# Patient Record
Sex: Female | Born: 1985 | Race: White | Hispanic: No | Marital: Married | Smoking: Former smoker
Health system: Southern US, Community
[De-identification: ages and names within clinical notes are randomized; demographics above are authoritative.]

## PROBLEM LIST (undated history)

## (undated) DIAGNOSIS — G43909 Migraine, unspecified, not intractable, without status migrainosus: Secondary | ICD-10-CM

## (undated) DIAGNOSIS — E119 Type 2 diabetes mellitus without complications: Secondary | ICD-10-CM

## (undated) DIAGNOSIS — K219 Gastro-esophageal reflux disease without esophagitis: Secondary | ICD-10-CM

## (undated) DIAGNOSIS — E039 Hypothyroidism, unspecified: Secondary | ICD-10-CM

## (undated) DIAGNOSIS — M549 Dorsalgia, unspecified: Secondary | ICD-10-CM

## (undated) DIAGNOSIS — N200 Calculus of kidney: Secondary | ICD-10-CM

## (undated) DIAGNOSIS — M5416 Radiculopathy, lumbar region: Secondary | ICD-10-CM

## (undated) DIAGNOSIS — F329 Major depressive disorder, single episode, unspecified: Secondary | ICD-10-CM

## (undated) DIAGNOSIS — F32A Depression, unspecified: Secondary | ICD-10-CM

## (undated) DIAGNOSIS — Z765 Malingerer [conscious simulation]: Secondary | ICD-10-CM

## (undated) DIAGNOSIS — I10 Essential (primary) hypertension: Secondary | ICD-10-CM

## (undated) DIAGNOSIS — G8929 Other chronic pain: Secondary | ICD-10-CM

## (undated) DIAGNOSIS — F419 Anxiety disorder, unspecified: Secondary | ICD-10-CM

## (undated) DIAGNOSIS — F4310 Post-traumatic stress disorder, unspecified: Secondary | ICD-10-CM

## (undated) HISTORY — DX: Type 2 diabetes mellitus without complications: E11.9

## (undated) HISTORY — PX: KNEE SURGERY: SHX244

## (undated) HISTORY — PX: ANTERIOR CRUCIATE LIGAMENT REPAIR: SHX115

## (undated) HISTORY — PX: OTHER SURGICAL HISTORY: SHX169

---
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
## 2009-10-18 LAB — CONVERTED CEMR LAB: Pap Smear: NORMAL

## 2010-01-14 ENCOUNTER — Ambulatory Visit (HOSPITAL_COMMUNITY): Admission: RE | Admit: 2010-01-14 | Discharge: 2010-01-14 | Payer: Self-pay | Admitting: Obstetrics and Gynecology

## 2010-01-27 ENCOUNTER — Ambulatory Visit: Payer: Self-pay | Admitting: Nurse Practitioner

## 2010-01-27 ENCOUNTER — Inpatient Hospital Stay (HOSPITAL_COMMUNITY): Admission: AD | Admit: 2010-01-27 | Discharge: 2010-01-27 | Payer: Self-pay | Admitting: Obstetrics and Gynecology

## 2010-01-27 ENCOUNTER — Encounter: Payer: Self-pay | Admitting: Endocrinology

## 2010-02-10 ENCOUNTER — Ambulatory Visit (HOSPITAL_COMMUNITY): Admission: RE | Admit: 2010-02-10 | Discharge: 2010-02-10 | Payer: Self-pay | Admitting: Obstetrics and Gynecology

## 2010-03-22 ENCOUNTER — Encounter: Payer: Self-pay | Admitting: Endocrinology

## 2010-04-01 ENCOUNTER — Encounter: Payer: Self-pay | Admitting: Endocrinology

## 2010-04-05 ENCOUNTER — Encounter: Payer: Self-pay | Admitting: Endocrinology

## 2010-04-15 ENCOUNTER — Encounter: Payer: Self-pay | Admitting: Endocrinology

## 2010-04-19 ENCOUNTER — Ambulatory Visit: Payer: Self-pay | Admitting: Interventional Radiology

## 2010-04-19 ENCOUNTER — Ambulatory Visit (HOSPITAL_BASED_OUTPATIENT_CLINIC_OR_DEPARTMENT_OTHER): Admission: RE | Admit: 2010-04-19 | Discharge: 2010-04-19 | Payer: Self-pay | Admitting: Obstetrics and Gynecology

## 2010-04-21 ENCOUNTER — Inpatient Hospital Stay (HOSPITAL_COMMUNITY): Admission: AD | Admit: 2010-04-21 | Discharge: 2010-04-21 | Payer: Self-pay | Admitting: Obstetrics and Gynecology

## 2010-04-22 ENCOUNTER — Inpatient Hospital Stay (HOSPITAL_COMMUNITY): Admission: AD | Admit: 2010-04-22 | Discharge: 2010-04-22 | Payer: Self-pay | Admitting: Obstetrics and Gynecology

## 2010-04-24 ENCOUNTER — Observation Stay (HOSPITAL_COMMUNITY): Admission: AD | Admit: 2010-04-24 | Discharge: 2010-04-25 | Payer: Self-pay | Admitting: Obstetrics and Gynecology

## 2010-04-26 ENCOUNTER — Ambulatory Visit: Payer: Self-pay | Admitting: Endocrinology

## 2010-04-26 DIAGNOSIS — O99810 Abnormal glucose complicating pregnancy: Secondary | ICD-10-CM

## 2010-04-30 ENCOUNTER — Inpatient Hospital Stay (HOSPITAL_COMMUNITY): Admission: AD | Admit: 2010-04-30 | Discharge: 2010-04-30 | Payer: Self-pay | Admitting: *Deleted

## 2010-05-05 ENCOUNTER — Encounter: Payer: Self-pay | Admitting: Endocrinology

## 2010-05-07 ENCOUNTER — Inpatient Hospital Stay (HOSPITAL_COMMUNITY)
Admission: AD | Admit: 2010-05-07 | Discharge: 2010-05-07 | Payer: Self-pay | Source: Home / Self Care | Admitting: Obstetrics and Gynecology

## 2010-05-19 ENCOUNTER — Inpatient Hospital Stay (HOSPITAL_COMMUNITY)
Admission: AD | Admit: 2010-05-19 | Discharge: 2010-05-19 | Payer: Self-pay | Source: Home / Self Care | Admitting: Obstetrics and Gynecology

## 2010-05-25 ENCOUNTER — Encounter: Payer: Self-pay | Admitting: Endocrinology

## 2010-05-27 ENCOUNTER — Inpatient Hospital Stay (HOSPITAL_COMMUNITY)
Admission: AD | Admit: 2010-05-27 | Discharge: 2010-05-30 | Payer: Self-pay | Source: Home / Self Care | Attending: Obstetrics and Gynecology | Admitting: Obstetrics and Gynecology

## 2010-05-27 ENCOUNTER — Encounter: Payer: Self-pay | Admitting: Obstetrics and Gynecology

## 2010-06-03 ENCOUNTER — Encounter: Payer: Self-pay | Admitting: Endocrinology

## 2010-06-22 ENCOUNTER — Encounter: Payer: Self-pay | Admitting: Endocrinology

## 2010-07-18 LAB — CONVERTED CEMR LAB
AST: 20 units/L
Alkaline Phosphatase: 110 units/L
BUN: 2 mg/dL
Basophils Relative: 0 %
Calcium: 8.9 mg/dL
GFR calc Af Amer: >60 mL/min
GFR calc non Af Amer: >60 mL/min
Glucose, Bld: 98 mg/dL
Glucose, Urine, Semiquant: NEGATIVE
HCT: 32.3 %
HCT: 32.5 %
Hemoglobin: 10.9 g/dL
Hemoglobin: 10.9 g/dL
Ketones, urine, test strip: NEGATIVE
Lymphocytes, automated: 2.4 %
MCV: 86.7 fL
Nitrite: NEGATIVE
Platelets: 278 10*3/uL
Platelets: 295 10*3/uL
Potassium: 3.8 meq/L
RBC: 3.76 M/uL
Sodium: 140 meq/L
Total Protein: 6.4 g/dL
Urobilinogen, UA: 0.2
WBC: 8.6 10*3/uL
pH: 7

## 2010-07-20 NOTE — Medication Information (Signed)
Summary: Diabetes Supplies/[facility]  Diabetes Supplies/[facility]   Imported By: Sherian Rein 05/27/2010 08:27:52  _____________________________________________________________________  External Attachment:    Type:   Image     Comment:   External Document

## 2010-07-20 NOTE — Medication Information (Signed)
Summary: Diabtes Supplies/[facility]  Diabtes Supplies/[facility]   Imported By: Sherian Rein 05/10/2010 07:45:04  _____________________________________________________________________  External Attachment:    Type:   Image     Comment:   External Document

## 2010-07-20 NOTE — Assessment & Plan Note (Signed)
Summary: NEW MEDICAID NOT CAR ACCESS PT-GESTATIONAL DIABETES-PER TRACE...   Vital Signs:  Patient profile:   25 year old female Height:      61.25 inches (155.57 cm) Weight:      268.25 pounds (121.93 kg) BMI:     50.45 O2 Sat:      97 % on Room air Temp:     98.41 degrees F (36.89 degrees C) oral Pulse rate:   118 / minute BP sitting:   124 / 70  (left arm) Cuff size:   large  Vitals Entered By: Brenton Grills CMA Duncan Dull) (April 26, 2010 2:51 PM)  O2 Flow:  Room air CC: New Endo/Gestational Diabetes/[facility]/aj Is Patient Diabetic? Yes   Referring Provider:  E. Erling Cruz, MD Primary Provider:  Brent Bulla MD  CC:  New Endo/Gestational Diabetes/[facility]/aj.  History of Present Illness: pt is G3 P1 (F0 P1 A1 L1).  pt is now at [redacted] weeks gestation.  pt had pre-eclampsia with previous pregnancy, but not gdm.  she brings a record of her cbg's which i have reviewed today.  it varies from [phone], with no trend throughout the day.  she had steriod injections x 2 for preterm labor.  most recent injection was 5 days ago. she has gained 8 lbs, for the entire pregnancy.  she was started on glyburide 1 week ago.   symptomatically, pt states 8 mos of intermittent severe headache, worst at the bioccipital areas, and assoc n/v.    Current Medications (verified): 1)  Glyburide 2.5 Mg Tabs (Glyburide) .... 2 By By Mouth Once Daily  Allergies (verified): 1)  ! Macrobid (Nitrofurantoin Monohyd Macro)  Past History:  Past Medical History: Gestational Diabetes  Past Surgical History: C-Section (2007) Tonsillectomy (1991)  Family History: Reviewed history and no changes required. Family History of Alcoholism/Addiction Family History of Arthritis Family History Lung cancer Family History of Stroke (Parent, Other Blood Relative)  Social History: Reviewed history and no changes required. engaged to be married Former Smoker Alcohol use-no Regular  exercise-no Smoking Status:  quit Does Patient Exercise:  no Seat Belt Use:  yes  Review of Systems       denies blurry vision, chest pain, diarrhea, dysuria, excessive diaphoresis, memory loss, depression, hypoglycemia, and easy bruising.  she reports fatigue, doe, muscle cramps, nasal congestion, and excessive thirst.    Physical Exam  General:  obese.  gravid Head:  head: no deformity eyes: no periorbital swelling, no proptosis external nose and ears are normal mouth: no lesion seen Neck:  pt has little if any thyromegaly Lungs:  Clear to auscultation bilaterally. Normal respiratory effort.  Heart:  Regular rate and rhythm without murmurs or gallops noted. Normal S1,S2.   Msk:  muscle bulk and strength are grossly normal.  no obvious joint swelling.  gait is normal and steady  Pulses:  dorsalis pedis intact bilat.   Extremities:  no deformity.  no ulcer on the feet.  feet are of normal color and temp.  trace right pedal edema and trace left pedal edema.   Neurologic:  cn 2-12 grossly intact.   readily moves all 4's.   sensation is intact to touch on the feet  Skin:  normal texture and temp.  no rash.  not diaphoretic  Cervical Nodes:  No significant adenopathy.  Psych:  Alert and cooperative; normal mood and affect; normal attention span and concentration.   Additional Exam:  pt says ultrasound a few days ago shows fetal  weight at 60th %ile  outside test results are reviewed:  ogtt (100g) [id number]   Impression & Recommendations:  Problem # 1:  GESTATIONAL DIABETES (ICD-648.80) needs increased rx  Problem # 2:  apparent recent episode of preterm labor not thyroid-related  Problem # 3:  headache and other sxs, not due to gdm  Medications Added to Medication List This Visit: 1)  Glyburide 2.5 Mg Tabs (Glyburide) .Marland Kitchen.. 1 by mouth once daily 2)  Humalog Kwikpen 100 Unit/ml Soln (Insulin lispro (human)) .... 5 units three times a day (just before each meal), and  pen needles three times a day  Other Orders: TLB-TSH (Thyroid Stimulating Hormone) (16109-UEA) New Patient Level IV (54098)  Patient Instructions: 1)  add humalog 5 units three times a day (just before each meal) 2)  reduce glyburide to 1 tab each am 3)  call 3 days to report progress.  call sooner if blood sugar goes low. 4)  Please schedule a follow-up appointment in 1-2 week. 5)  see dm educator at [facility] tomorrow am to learn about insulin.   6)  check your blood sugar 4 times a day--before the 3 meals, and at bedtime.  also check if you have symptoms of your blood sugar being too high or too low.  please keep a record of the readings and bring it to your next appointment here.  please call us sooner if you are having low blood sugar episodes. 7)  blood tests are being ordered for you today.  please call [phone] to hear your test results. 8)  (update: i left message on phone-tree:  tsh is normal.  rx as we discussed) Prescriptions: HUMALOG KWIKPEN 100 UNIT/ML SOLN (INSULIN LISPRO (HUMAN)) 5 units three times a day (just before each meal), and pen needles three times a day  #1 box x 11   Entered and Authorized by:   Minus Breeding MD   Signed by:   Minus Breeding MD on 04/26/2010   Method used:   Print then Give to Patient   RxID:   [id number]    Orders Added: 1)  TLB-TSH (Thyroid Stimulating Hormone) [62952-WUX] 2)  New Patient Level IV [32440]   Immunization History:  Influenza Immunization History:    Influenza:  historical (03/20/2010)   Immunization History:  Influenza Immunization History:    Influenza:  Historical (03/20/2010)   Preventive Care Screening  Pap Smear:    Date:  10/18/2009    Results:  normal   Last Tetanus Booster:    Date:  06/20/2006    Results:  Historical

## 2010-07-20 NOTE — Medication Information (Signed)
Summary: Order / [facility]  Order / [facility]   Imported By: Lennie Odor 05/21/2010 14:13:41  _____________________________________________________________________  External Attachment:    Type:   Image     Comment:   External Document

## 2010-07-22 NOTE — Letter (Signed)
Summary: [facility]  [facility]   Imported By: Lester  06/08/2010 09:26:08  _____________________________________________________________________  External Attachment:    Type:   Image     Comment:   External Document

## 2010-07-22 NOTE — Letter (Signed)
Summary: Request correct Dx for Diabetes Supplies/[facility]  Request correct Dx for Diabetes Supplies/[facility]   Imported By: Sherian Rein 06/25/2010 09:05:43  _____________________________________________________________________  External Attachment:    Type:   Image     Comment:   External Document

## 2010-08-31 LAB — CBC
HCT: 30.7 % — ABNORMAL LOW (ref 36.0–46.0)
MCH: 29 pg (ref 26.0–34.0)
MCHC: 33.9 g/dL (ref 30.0–36.0)
MCV: 83.9 fL (ref 78.0–100.0)
MCV: 84.4 fL (ref 78.0–100.0)
Platelets: 266 10*3/uL (ref 150–400)
Platelets: 267 10*3/uL (ref 150–400)
Platelets: 273 10*3/uL (ref 150–400)
Platelets: 302 10*3/uL (ref 150–400)
Platelets: 295 10*3/uL (ref 150–400)
RBC: 3.1 MIL/uL — ABNORMAL LOW (ref 3.87–5.11)
RDW: 15.1 % (ref 11.5–15.5)
RDW: 15.1 % (ref 11.5–15.5)
RDW: 15.6 % — ABNORMAL HIGH (ref 11.5–15.5)
RDW: 15.5 % (ref 11.5–15.5)
WBC: 8.4 10*3/uL (ref 4.0–10.5)
WBC: 8.9 10*3/uL (ref 4.0–10.5)
WBC: 9.1 10*3/uL (ref 4.0–10.5)

## 2010-08-31 LAB — URINE CULTURE
Colony Count: NO GROWTH
Culture  Setup Time: 201111190123
Culture: NO GROWTH

## 2010-08-31 LAB — COMPREHENSIVE METABOLIC PANEL
ALT: 20 U/L (ref 0–35)
AST: 28 U/L (ref 0–37)
Albumin: 1.8 g/dL — ABNORMAL LOW (ref 3.5–5.2)
Albumin: 2.1 g/dL — ABNORMAL LOW (ref 3.5–5.2)
Albumin: 2.3 g/dL — ABNORMAL LOW (ref 3.5–5.2)
Albumin: 2.1 g/dL — ABNORMAL LOW (ref 3.5–5.2)
Alkaline Phosphatase: 140 U/L — ABNORMAL HIGH (ref 39–117)
Alkaline Phosphatase: 180 U/L — ABNORMAL HIGH (ref 39–117)
Alkaline Phosphatase: 169 U/L — ABNORMAL HIGH (ref 39–117)
BUN: 1 mg/dL — ABNORMAL LOW (ref 6–23)
BUN: 2 mg/dL — ABNORMAL LOW (ref 6–23)
BUN: 2 mg/dL — ABNORMAL LOW (ref 6–23)
Calcium: 8.3 mg/dL — ABNORMAL LOW (ref 8.4–10.5)
Chloride: 105 mEq/L (ref 96–112)
Chloride: 105 mEq/L (ref 96–112)
Creatinine, Ser: 0.43 mg/dL (ref 0.4–1.2)
Creatinine, Ser: 0.49 mg/dL (ref 0.4–1.2)
Creatinine, Ser: 0.54 mg/dL (ref 0.4–1.2)
GFR calc Af Amer: >60 mL/min (ref >60)
GFR calc Af Amer: >60 mL/min (ref >60)
Potassium: 3.2 mEq/L — ABNORMAL LOW (ref 3.5–5.1)
Potassium: 3.3 mEq/L — ABNORMAL LOW (ref 3.5–5.1)
Potassium: 3.2 mEq/L — ABNORMAL LOW (ref 3.5–5.1)
Sodium: 140 mEq/L (ref 135–145)
Total Bilirubin: 0.3 mg/dL (ref 0.3–1.2)
Total Bilirubin: 0.3 mg/dL (ref 0.3–1.2)
Total Bilirubin: 0.4 mg/dL (ref 0.3–1.2)
Total Protein: 5.2 g/dL — ABNORMAL LOW (ref 6.0–8.3)
Total Protein: 6 g/dL (ref 6.0–8.3)
Total Protein: 5.6 g/dL — ABNORMAL LOW (ref 6.0–8.3)

## 2010-08-31 LAB — URINALYSIS, ROUTINE W REFLEX MICROSCOPIC
Bilirubin Urine: NEGATIVE
Bilirubin Urine: NEGATIVE
Glucose, UA: NEGATIVE mg/dL
Hgb urine dipstick: NEGATIVE
Hgb urine dipstick: NEGATIVE
Ketones, ur: NEGATIVE mg/dL
Ketones, ur: NEGATIVE mg/dL
Ketones, ur: NEGATIVE mg/dL
Nitrite: NEGATIVE
Nitrite: NEGATIVE
Nitrite: NEGATIVE
Protein, ur: NEGATIVE mg/dL
Protein, ur: NEGATIVE mg/dL
Specific Gravity, Urine: 1.02 (ref 1.005–1.030)
Urobilinogen, UA: 0.2 mg/dL (ref 0.0–1.0)
pH: 7.5 (ref 5.0–8.0)

## 2010-08-31 LAB — TYPE AND SCREEN
ABO/RH(D): B POS
Antibody Screen: NEGATIVE

## 2010-08-31 LAB — STREP B DNA PROBE

## 2010-08-31 LAB — GLUCOSE, CAPILLARY
Glucose-Capillary: 115 mg/dL — ABNORMAL HIGH (ref 70–99)
Glucose-Capillary: 87 mg/dL (ref 70–99)
Glucose-Capillary: 98 mg/dL (ref 70–99)

## 2010-08-31 LAB — BASIC METABOLIC PANEL
CO2: 31 mEq/L (ref 19–32)
Chloride: 101 mEq/L (ref 96–112)
Glucose, Bld: 104 mg/dL — ABNORMAL HIGH (ref 70–99)
Potassium: 3.1 mEq/L — ABNORMAL LOW (ref 3.5–5.1)
Sodium: 140 mEq/L (ref 135–145)

## 2010-08-31 LAB — URIC ACID
Uric Acid, Serum: 4 mg/dL (ref 2.4–7.0)
Uric Acid, Serum: 4.3 mg/dL (ref 2.4–7.0)

## 2010-08-31 LAB — RPR: RPR Ser Ql: NONREACTIVE

## 2010-08-31 LAB — URINE MICROSCOPIC-ADD ON

## 2010-09-03 LAB — URINALYSIS, ROUTINE W REFLEX MICROSCOPIC
Bilirubin Urine: NEGATIVE
Glucose, UA: NEGATIVE mg/dL
Hgb urine dipstick: NEGATIVE
Ketones, ur: NEGATIVE mg/dL
Protein, ur: NEGATIVE mg/dL
Urobilinogen, UA: 0.2 mg/dL (ref 0.0–1.0)

## 2010-09-03 LAB — WET PREP, GENITAL
Clue Cells Wet Prep HPF POC: NONE SEEN
Trich, Wet Prep: NONE SEEN
Yeast Wet Prep HPF POC: NONE SEEN

## 2010-09-03 LAB — URINE MICROSCOPIC-ADD ON
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
## 2010-11-05 NOTE — Discharge Summary (Signed)
[facility]  Patient:    Sonya Gibson, Sonya Gibson                     MRN: [id number] Adm. Date:  [id number] Disc. Date: 09/18/00 Attending:  Veneta Penton                           Discharge Summary  REASON FOR ADMISSION:  This 25 year old white female was admitted complaining of depression with suicidal ideation with a plan she refused to discuss and was unable to contract for safety at that time.  Further further history of present illness, please see the patients psychiatric admission assessment.  PHYSICAL EXAMINATION:  The patients physical examination at the time of admission was significant for her being overweight.  LABORATORY EXAMINATION:  The patient underwent a laboratory work-up to rule out any medical problems contributing to her symptomatology.  UA was unremarkable.  Urine pregnancy test was negative.  A metabolic panel was within normal limits.  CBC showed an MCHC of 34.1 and was otherwise unremarkable.  The patient received no x-rays, no special procedures, no additional consultations. The patient sustained no complications during the course of this hospitalization.  HOSPITAL COURSE:  The patient, throughout her hospitalization, showed excessive reliance on histrionic and borderline defense mechanisms.  Her affect and mood on admission were depressed and irritable. She was begun on a trial of Effexor XR and has tolerated this medication well without side effects.  She has been participating in all aspects of the therapeutic treatment program.  She denies any homicidal or suicidal ideations at the time of admission and is motivated for outpatient therapy.  CONDITION ON DISCHARGE:  Improved.  DIAGNOSIS ACCORDING TO DSM-4: AXIS I.   1. Major depression, recurrent type, severe without psychosis.           2. Bulimia nervosa.           3. Oppositional defiant disorder. AXIS II.  1. Histrionic and borderline traits.           2. Rule  out personality disorder, not otherwise specified. AXIS III. Obesity. AXIS IV.  Severe. AXIS V.   Code 20 on admission, code 30 on discharge.  FURTHER EVALUATION AND TREATMENT RECOMMENDATIONS: 1. The patient is discharged to home. 2. The patient is discharged on Effexor XR 37.6 mg p.o. q.a.m. with food    for four days, then increasing to 75 mg p.o. q.a.m. 3. She is discharged on an unrestricted level of activity and a regular diet. 4. She will follow up at the [facility] for all further    aspects of her mental health care and consequently I will sign off on the    case at this time. DD:  09/18/00 TD:  09/18/00 Job: [id number]

## 2013-04-12 LAB — HM COLONOSCOPY

## 2014-12-28 ENCOUNTER — Encounter (HOSPITAL_COMMUNITY): Payer: Self-pay

## 2014-12-28 DIAGNOSIS — Z3202 Encounter for pregnancy test, result negative: Secondary | ICD-10-CM | POA: Insufficient documentation

## 2014-12-28 DIAGNOSIS — R112 Nausea with vomiting, unspecified: Secondary | ICD-10-CM | POA: Insufficient documentation

## 2014-12-28 DIAGNOSIS — Z8659 Personal history of other mental and behavioral disorders: Secondary | ICD-10-CM | POA: Insufficient documentation

## 2014-12-28 DIAGNOSIS — R1011 Right upper quadrant pain: Secondary | ICD-10-CM | POA: Insufficient documentation

## 2014-12-28 DIAGNOSIS — Z8719 Personal history of other diseases of the digestive system: Secondary | ICD-10-CM | POA: Insufficient documentation

## 2014-12-28 DIAGNOSIS — R197 Diarrhea, unspecified: Secondary | ICD-10-CM | POA: Insufficient documentation

## 2014-12-28 LAB — CBC WITH DIFFERENTIAL/PLATELET
Basophils Absolute: 0 10*3/uL (ref 0.0–0.1)
Basophils Relative: 0 % (ref 0–1)
EOS ABS: 0.2 10*3/uL (ref 0.0–0.7)
EOS PCT: 3 % (ref 0–5)
HEMATOCRIT: 39.2 % (ref 36.0–46.0)
HEMOGLOBIN: 13.1 g/dL (ref 12.0–15.0)
LYMPHS ABS: 3.3 10*3/uL (ref 0.7–4.0)
LYMPHS PCT: 44 % (ref 12–46)
MCH: 29.1 pg (ref 26.0–34.0)
MCHC: 33.4 g/dL (ref 30.0–36.0)
MCV: 87.1 fL (ref 78.0–100.0)
MONOS PCT: 5 % (ref 3–12)
Monocytes Absolute: 0.4 10*3/uL (ref 0.1–1.0)
Neutro Abs: 3.6 10*3/uL (ref 1.7–7.7)
Neutrophils Relative %: 48 % (ref 43–77)
PLATELETS: 335 10*3/uL (ref 150–400)
RBC: 4.5 MIL/uL (ref 3.87–5.11)
RDW: 14.2 % (ref 11.5–15.5)
WBC: 7.6 10*3/uL (ref 4.0–10.5)

## 2014-12-28 LAB — COMPREHENSIVE METABOLIC PANEL
ALBUMIN: 3.4 g/dL — AB (ref 3.5–5.0)
ALT: 33 U/L (ref 14–54)
AST: 40 U/L (ref 15–41)
Alkaline Phosphatase: 75 U/L (ref 38–126)
Anion gap: 9 (ref 5–15)
BUN: 6 mg/dL (ref 6–20)
CALCIUM: 8.6 mg/dL — AB (ref 8.9–10.3)
CO2: 24 mmol/L (ref 22–32)
CREATININE: 0.85 mg/dL (ref 0.44–1.00)
Chloride: 104 mmol/L (ref 101–111)
GFR calc Af Amer: >60 mL/min (ref >60)
Glucose, Bld: 94 mg/dL (ref 65–99)
Potassium: 4 mmol/L (ref 3.5–5.1)
SODIUM: 137 mmol/L (ref 135–145)
Total Bilirubin: 0.6 mg/dL (ref 0.3–1.2)
Total Protein: 7 g/dL (ref 6.5–8.1)

## 2014-12-28 LAB — URINALYSIS, ROUTINE W REFLEX MICROSCOPIC
Glucose, UA: NEGATIVE mg/dL
Hgb urine dipstick: NEGATIVE
KETONES UR: NEGATIVE mg/dL
LEUKOCYTES UA: NEGATIVE
NITRITE: NEGATIVE
Protein, ur: NEGATIVE mg/dL
Specific Gravity, Urine: 1.027 (ref 1.005–1.030)
UROBILINOGEN UA: 0.2 mg/dL (ref 0.0–1.0)
pH: 5 (ref 5.0–8.0)

## 2014-12-28 LAB — LIPASE, BLOOD: Lipase: 18 U/L — ABNORMAL LOW (ref 22–51)

## 2014-12-28 NOTE — ED Notes (Signed)
Pt here for fever, nausea, and diarrhea, along with abd cramping. Onset 1 week.

## 2014-12-29 ENCOUNTER — Emergency Department (HOSPITAL_COMMUNITY): Payer: Self-pay

## 2014-12-29 ENCOUNTER — Emergency Department (HOSPITAL_COMMUNITY)
Admission: EM | Admit: 2014-12-29 | Discharge: 2014-12-29 | Disposition: A | Discharge status: Home / Self Care | Payer: Self-pay | Attending: Emergency Medicine | Admitting: Emergency Medicine

## 2014-12-29 ENCOUNTER — Encounter (HOSPITAL_COMMUNITY): Payer: Self-pay

## 2014-12-29 DIAGNOSIS — R112 Nausea with vomiting, unspecified: Secondary | ICD-10-CM

## 2014-12-29 DIAGNOSIS — R197 Diarrhea, unspecified: Secondary | ICD-10-CM

## 2014-12-29 DIAGNOSIS — R109 Unspecified abdominal pain: Secondary | ICD-10-CM

## 2014-12-29 HISTORY — DX: Gastro-esophageal reflux disease without esophagitis: K21.9

## 2014-12-29 HISTORY — DX: Anxiety disorder, unspecified: F41.9

## 2014-12-29 HISTORY — DX: Major depressive disorder, single episode, unspecified: F32.9

## 2014-12-29 HISTORY — DX: Depression, unspecified: F32.A

## 2014-12-29 LAB — I-STAT BETA HCG BLOOD, ED (MC, WL, AP ONLY): I-stat hCG, quantitative: <5 m[IU]/mL (ref <5)

## 2014-12-29 MED ORDER — OXYCODONE-ACETAMINOPHEN 5-325 MG PO TABS: 1.0000 | ORAL_TABLET | ORAL | Status: DC | PRN

## 2014-12-29 MED ORDER — ONDANSETRON HCL 4 MG/2ML IJ SOLN
INTRAMUSCULAR | Status: AC
  Filled 2014-12-29: qty 2

## 2014-12-29 MED ORDER — HYDROMORPHONE HCL 1 MG/ML IJ SOLN
1.0000 mg | Freq: Once | INTRAMUSCULAR | Status: AC
  Administered 2014-12-29: 1 mg via INTRAVENOUS
  Filled 2014-12-29: qty 1

## 2014-12-29 MED ORDER — DIPHENHYDRAMINE HCL 50 MG/ML IJ SOLN
25.0000 mg | Freq: Once | INTRAMUSCULAR | Status: AC
  Administered 2014-12-29: 25 mg via INTRAVENOUS
  Filled 2014-12-29: qty 1

## 2014-12-29 MED ORDER — MORPHINE SULFATE 4 MG/ML IJ SOLN
4.0000 mg | Freq: Once | INTRAMUSCULAR | Status: AC
  Administered 2014-12-29: 4 mg via INTRAVENOUS
  Filled 2014-12-29: qty 1

## 2014-12-29 MED ORDER — MORPHINE SULFATE 4 MG/ML IJ SOLN
6.0000 mg | Freq: Once | INTRAMUSCULAR | Status: AC
  Administered 2014-12-29: 6 mg via INTRAVENOUS
  Filled 2014-12-29: qty 2

## 2014-12-29 MED ORDER — ONDANSETRON HCL 4 MG/2ML IJ SOLN
4.0000 mg | Freq: Once | INTRAMUSCULAR | Status: AC
  Administered 2014-12-29: 4 mg via INTRAVENOUS
  Filled 2014-12-29: qty 2

## 2014-12-29 MED ORDER — METOCLOPRAMIDE HCL 5 MG/ML IJ SOLN
10.0000 mg | Freq: Once | INTRAMUSCULAR | Status: AC
  Administered 2014-12-29: 10 mg via INTRAVENOUS
  Filled 2014-12-29: qty 2

## 2014-12-29 MED ORDER — IOHEXOL 300 MG/ML  SOLN: 25.0000 mL | Freq: Once | INTRAMUSCULAR | Status: DC | PRN

## 2014-12-29 MED ORDER — ONDANSETRON HCL 4 MG/2ML IJ SOLN
4.0000 mg | Freq: Once | INTRAMUSCULAR | Status: AC
  Administered 2014-12-29: 4 mg via INTRAVENOUS

## 2014-12-29 MED ORDER — SODIUM CHLORIDE 0.9 % IV BOLUS (SEPSIS)
1000.0000 mL | Freq: Once | INTRAVENOUS | Status: AC
  Administered 2014-12-29: 1000 mL via INTRAVENOUS

## 2014-12-29 MED ORDER — ONDANSETRON 8 MG PO TBDP: 8.0000 mg | ORAL_TABLET | Freq: Three times a day (TID) | ORAL | Status: DC | PRN

## 2014-12-29 MED ORDER — KETOROLAC TROMETHAMINE 30 MG/ML IJ SOLN
30.0000 mg | Freq: Once | INTRAMUSCULAR | Status: AC
  Administered 2014-12-29: 30 mg via INTRAVENOUS
  Filled 2014-12-29: qty 1

## 2014-12-29 MED ORDER — IOHEXOL 300 MG/ML  SOLN
100.0000 mL | Freq: Once | INTRAMUSCULAR | Status: AC | PRN
  Administered 2014-12-29: 100 mL via INTRAVENOUS

## 2014-12-29 MED ORDER — LORAZEPAM 2 MG/ML IJ SOLN
1.0000 mg | Freq: Once | INTRAMUSCULAR | Status: AC
  Administered 2014-12-29: 1 mg via INTRAVENOUS
  Filled 2014-12-29: qty 1

## 2014-12-29 NOTE — ED Notes (Signed)
Pt nauseous and vomiting in the room.  Zofran given prior to transport to CT

## 2014-12-29 NOTE — ED Notes (Addendum)
Pt still in US.  Family member given comfort care.

## 2014-12-29 NOTE — ED Provider Notes (Signed)
CSN: [id number]     Arrival date & time 12/28/14  2236 History  This chart was scribed for Azalia Bilis, MD by Octavia Heir, ED Scribe. This patient was seen in room A10C/A10C and the patient's care was started at 1:18 AM.    Chief Complaint  Patient presents with  . Nausea  . Diarrhea  . Abdominal Cramping      Patient is a 29 y.o. female presenting with cramps. The history is provided by the patient. No language interpreter was used.  Abdominal Cramping   HPI Comments: Sonya Gibson is a 29 y.o. female who presents to the Emergency Department complaining of constant, gradual worsening sickness onset one week ago. Pt notes having severe RUQ pain that radiates to her back. Pt has associated fever (TMax 101), chills, nausea, vomiting and diarrhea.   Past Medical History  Diagnosis Date  . Anxiety   . Depression   . GERD (gastroesophageal reflux disease)    History reviewed. No pertinent past surgical history. History reviewed. No pertinent family history. History  Substance Use Topics  . Smoking status: Never Smoker   . Smokeless tobacco: Not on file  . Alcohol Use: No   OB History    No data available     Review of Systems  A complete 10 system review of systems was obtained and all systems are negative except as noted in the HPI and PMH.    Allergies  Nitrofurantoin  Home Medications   Prior to Admission medications   Not on File   Triage vitals: BP 119/75 mmHg  Pulse 102  Temp(Src) 98.1 F (36.7 C) (Oral)  Resp 14  Ht  (1.549 m)  Wt 292 lb (132.45 kg)  BMI 55.20 kg/m2  SpO2 95% Physical Exam  Constitutional: She is oriented to person, place, and time. She appears well-developed and well-nourished. No distress.  HENT:  Head: Normocephalic and atraumatic.  Eyes: EOM are normal.  Neck: Normal range of motion.  Cardiovascular: Normal rate, regular rhythm and normal heart sounds.   Pulmonary/Chest: Effort normal and breath sounds normal.   Abdominal: Soft. She exhibits no distension. There is no tenderness.  RUQ tenderness  Musculoskeletal: Normal range of motion.  Neurological: She is alert and oriented to person, place, and time.  Skin: Skin is warm and dry.  Psychiatric: She has a normal mood and affect. Judgment normal.  Nursing note and vitals reviewed.   ED Course  Procedures  DIAGNOSTIC STUDIES: Oxygen Saturation is 99% on RA, normal by my interpretation.  COORDINATION OF CARE:  1:20 AM Discussed treatment plan which includes lab work, ultrasound  with pt at bedside and pt agreed to plan.  Labs Review Labs Reviewed  COMPREHENSIVE METABOLIC PANEL - Abnormal; Notable for the following:    Calcium 8.6 (*)    Albumin 3.4 (*)    All other components within normal limits  LIPASE, BLOOD - Abnormal; Notable for the following:    Lipase 18 (*)    All other components within normal limits  URINALYSIS, ROUTINE W REFLEX MICROSCOPIC (NOT AT [facility]) - Abnormal; Notable for the following:    Color, Urine AMBER (*)    Bilirubin Urine SMALL (*)    All other components within normal limits  CBC WITH DIFFERENTIAL/PLATELET  I-STAT BETA HCG BLOOD, ED ([facility], [facility], [facility] ONLY)    Imaging Review US Abdomen Complete  12/29/2014   CLINICAL DATA:  Right upper quadrant pain and right flank pain.  EXAM: ULTRASOUND ABDOMEN COMPLETE  COMPARISON:  None.  FINDINGS: Gallbladder: No gallstones or wall thickening visualized. No sonographic Murphy sign noted.  Common bile duct: Diameter: 4 mm, normal  Liver: Diffusely increased hepatic parenchymal echotexture likely representing fatty infiltration. No focal lesions identified.  IVC: No abnormality visualized.  Pancreas: Visualized portion unremarkable.  Spleen: Size and appearance within normal limits.  Right Kidney: Length: 11.6 cm. Echogenicity within normal limits. No mass or hydronephrosis visualized.  Left Kidney: Length: 11.8 cm. Echogenicity within normal limits. No mass or hydronephrosis  visualized.  Abdominal aorta: No aneurysm visualized.  Other findings: None.  IMPRESSION: Diffuse fatty infiltration of the liver. No acute abnormalities identified.   Electronically Signed   By: Burman NievesWilliam  Stevens M.D.   On: 12/29/2014 03:30   Ct Abdomen Pelvis W Contrast  12/29/2014   CLINICAL DATA:  RIGHT abdominal pain for a few days, vomiting for 1 week. Abdominal pain worsening over 24 hours. History of IUD.  EXAM: CT ABDOMEN AND PELVIS WITH CONTRAST  TECHNIQUE: Multidetector CT imaging of the abdomen and pelvis was performed using the standard protocol following bolus administration of intravenous contrast.  CONTRAST:  100mL OMNIPAQUE IOHEXOL 300 MG/ML  SOLN  COMPARISON:  Abdominal ultrasound December 29, 2014 at 2:46 a.m.  FINDINGS: LUNG BASES: Included view of the lung bases are clear. Visualized heart and pericardium are unremarkable.  SOLID ORGANS: The liver, is diffusely mildly hypodense consistent with hepatic steatosis with focal fatty sparing about the gallbladder fossa. Spleen, gallbladder, pancreas and adrenal glands are unremarkable.  GASTROINTESTINAL TRACT: The stomach, small and large bowel are normal in course and caliber without inflammatory changes. Enteric contrast has not yet reached the distal small bowel. Normal appendix.  KIDNEYS/ URINARY TRACT: Kidneys are orthotopic, demonstrating symmetric enhancement. No nephrolithiasis, hydronephrosis or solid renal masses. The unopacified ureters are normal in course and caliber. Urinary bladder is partially distended and unremarkable.  PERITONEUM/RETROPERITONEUM: Aortoiliac vessels are normal in course and caliber. No lymphadenopathy by CT size criteria. Small lymph nodes in the RIGHT lower quadrant are likely reactive. IUD appears central within the uterus. No intraperitoneal free fluid nor free air.  SOFT TISSUE/OSSEOUS STRUCTURES: Non-suspicious. Small L4-5 and L5-S1 broad-based disc osteophyte complexes. Small fat containing umbilical hernia.   IMPRESSION: Small lymph nodes in the RIGHT lower quadrant are likely reactive. Normal appendix.  Hepatic steatosis.   Electronically Signed   By: Awilda Metroourtnay  Bloomer M.D.   On: 12/29/2014 06:06     EKG Interpretation None      MDM   Final diagnoses:  Nausea vomiting and diarrhea  Abdominal pain, unspecified abdominal location    7:08 AM Patient feels much better at this time.  Patient had right-sided abdominal pain.  Initially her ultrasound was negative and thus I moved on CT scan given how symptomatic she was.  CT scan demonstrates no acute pathology.  This still likely probably a viral process.  Hydrated in the ER.  Vital signs normal.  Discharge home with pain medicine and nausea medication.  Primary care follow-up.  Patient given referral numbers for GI if her symptoms continue.  I personally performed the services described in this documentation, which was scribed in my presence. The recorded information has been reviewed and is accurate.     Azalia BilisKevin Karilynn Carranza, MD 12/29/14 [phone]

## 2014-12-29 NOTE — ED Notes (Signed)
MD made aware of pts pain. 

## 2014-12-29 NOTE — Discharge Instructions (Signed)

## 2014-12-29 NOTE — ED Notes (Signed)
Patient transported to Ultrasound 

## 2015-05-04 ENCOUNTER — Encounter (HOSPITAL_COMMUNITY): Payer: Self-pay | Admitting: Emergency Medicine

## 2015-05-04 ENCOUNTER — Emergency Department (HOSPITAL_COMMUNITY)
Admission: EM | Admit: 2015-05-04 | Discharge: 2015-05-04 | Disposition: A | Discharge status: Home / Self Care | Payer: Self-pay | Attending: Emergency Medicine | Admitting: Emergency Medicine

## 2015-05-04 ENCOUNTER — Emergency Department (HOSPITAL_COMMUNITY): Payer: Self-pay

## 2015-05-04 DIAGNOSIS — N83201 Unspecified ovarian cyst, right side: Secondary | ICD-10-CM

## 2015-05-04 DIAGNOSIS — F419 Anxiety disorder, unspecified: Secondary | ICD-10-CM | POA: Insufficient documentation

## 2015-05-04 DIAGNOSIS — R102 Pelvic and perineal pain: Secondary | ICD-10-CM

## 2015-05-04 DIAGNOSIS — N72 Inflammatory disease of cervix uteri: Secondary | ICD-10-CM

## 2015-05-04 DIAGNOSIS — Z793 Long term (current) use of hormonal contraceptives: Secondary | ICD-10-CM | POA: Insufficient documentation

## 2015-05-04 DIAGNOSIS — F329 Major depressive disorder, single episode, unspecified: Secondary | ICD-10-CM | POA: Insufficient documentation

## 2015-05-04 DIAGNOSIS — Z8619 Personal history of other infectious and parasitic diseases: Secondary | ICD-10-CM | POA: Insufficient documentation

## 2015-05-04 DIAGNOSIS — Z8719 Personal history of other diseases of the digestive system: Secondary | ICD-10-CM | POA: Insufficient documentation

## 2015-05-04 DIAGNOSIS — Z79899 Other long term (current) drug therapy: Secondary | ICD-10-CM | POA: Insufficient documentation

## 2015-05-04 LAB — CBC WITH DIFFERENTIAL/PLATELET
BASOS PCT: 0 %
Basophils Absolute: 0 10*3/uL (ref 0.0–0.1)
EOS PCT: 1 %
Eosinophils Absolute: 0.1 10*3/uL (ref 0.0–0.7)
HCT: 38.8 % (ref 36.0–46.0)
Hemoglobin: 12.9 g/dL (ref 12.0–15.0)
Lymphocytes Relative: 41 %
Lymphs Abs: 4.5 10*3/uL — ABNORMAL HIGH (ref 0.7–4.0)
MCH: 28.9 pg (ref 26.0–34.0)
MCHC: 33.2 g/dL (ref 30.0–36.0)
MCV: 86.8 fL (ref 78.0–100.0)
MONO ABS: 0.6 10*3/uL (ref 0.1–1.0)
Monocytes Relative: 5 %
Neutro Abs: 5.8 10*3/uL (ref 1.7–7.7)
Neutrophils Relative %: 53 %
PLATELETS: 376 10*3/uL (ref 150–400)
RBC: 4.47 MIL/uL (ref 3.87–5.11)
RDW: 14.6 % (ref 11.5–15.5)
WBC: 10.9 10*3/uL — ABNORMAL HIGH (ref 4.0–10.5)

## 2015-05-04 LAB — URINE MICROSCOPIC-ADD ON

## 2015-05-04 LAB — COMPREHENSIVE METABOLIC PANEL
ALK PHOS: 79 U/L (ref 38–126)
ALT: 29 U/L (ref 14–54)
AST: 22 U/L (ref 15–41)
Albumin: 3.6 g/dL (ref 3.5–5.0)
Anion gap: 7 (ref 5–15)
BUN: 10 mg/dL (ref 6–20)
CALCIUM: 9.3 mg/dL (ref 8.9–10.3)
CHLORIDE: 105 mmol/L (ref 101–111)
CO2: 26 mmol/L (ref 22–32)
CREATININE: 0.82 mg/dL (ref 0.44–1.00)
GFR calc Af Amer: >60 mL/min (ref >60)
GFR calc non Af Amer: >60 mL/min (ref >60)
GLUCOSE: 124 mg/dL — AB (ref 65–99)
Potassium: 3.7 mmol/L (ref 3.5–5.1)
SODIUM: 138 mmol/L (ref 135–145)
Total Bilirubin: 0.6 mg/dL (ref 0.3–1.2)
Total Protein: 7.1 g/dL (ref 6.5–8.1)

## 2015-05-04 LAB — URINALYSIS, ROUTINE W REFLEX MICROSCOPIC
BILIRUBIN URINE: NEGATIVE
GLUCOSE, UA: NEGATIVE mg/dL
Ketones, ur: NEGATIVE mg/dL
Nitrite: NEGATIVE
PH: 7 (ref 5.0–8.0)
Protein, ur: NEGATIVE mg/dL
SPECIFIC GRAVITY, URINE: 1.02 (ref 1.005–1.030)
Urobilinogen, UA: 1 mg/dL (ref 0.0–1.0)

## 2015-05-04 LAB — RAPID HIV SCREEN (HIV 1/2 AB+AG)
HIV 1/2 Antibodies: NONREACTIVE
HIV-1 P24 ANTIGEN - HIV24: NONREACTIVE

## 2015-05-04 LAB — WET PREP, GENITAL
Trich, Wet Prep: NONE SEEN
Yeast Wet Prep HPF POC: NONE SEEN

## 2015-05-04 LAB — GC/CHLAMYDIA PROBE AMP (~~LOC~~) NOT AT ARMC
Chlamydia: NEGATIVE
NEISSERIA GONORRHEA: NEGATIVE

## 2015-05-04 LAB — I-STAT BETA HCG BLOOD, ED (MC, WL, AP ONLY): I-stat hCG, quantitative: <5 m[IU]/mL (ref <5)

## 2015-05-04 MED ORDER — MORPHINE SULFATE (PF) 4 MG/ML IV SOLN
4.0000 mg | Freq: Once | INTRAVENOUS | Status: AC
  Administered 2015-05-04: 4 mg via INTRAMUSCULAR
  Filled 2015-05-04: qty 1

## 2015-05-04 MED ORDER — LIDOCAINE HCL (PF) 1 % IJ SOLN
INTRAMUSCULAR | Status: AC
  Filled 2015-05-04: qty 5

## 2015-05-04 MED ORDER — HYDROCODONE-ACETAMINOPHEN 5-325 MG PO TABS: 1.0000 | ORAL_TABLET | ORAL | Status: DC | PRN

## 2015-05-04 MED ORDER — LIDOCAINE HCL (PF) 1 % IJ SOLN
0.9000 mL | Freq: Once | INTRAMUSCULAR | Status: AC
  Administered 2015-05-04: 0.9 mL via INTRADERMAL

## 2015-05-04 MED ORDER — CEFTRIAXONE SODIUM 250 MG IJ SOLR
250.0000 mg | Freq: Once | INTRAMUSCULAR | Status: AC
  Administered 2015-05-04: 250 mg via INTRAMUSCULAR
  Filled 2015-05-04: qty 250

## 2015-05-04 MED ORDER — DOXYCYCLINE HYCLATE 100 MG PO CAPS: 100.0000 mg | ORAL_CAPSULE | Freq: Two times a day (BID) | ORAL | Status: DC

## 2015-05-04 MED ORDER — KETOROLAC TROMETHAMINE 60 MG/2ML IM SOLN
60.0000 mg | Freq: Once | INTRAMUSCULAR | Status: AC
  Administered 2015-05-04: 60 mg via INTRAMUSCULAR
  Filled 2015-05-04: qty 2

## 2015-05-04 NOTE — ED Notes (Signed)
C/O pain in right lower abdomen. Reports as pelvic pain with a history of cysts on ovaries.  Pain started 3-4 days ago.  States I thought it was just period pain but I don't have a true period because of having an IUD.

## 2015-05-04 NOTE — Discharge Instructions (Signed)
Cervicitis Cervicitis is a soreness and swelling (inflammation) of the cervix. Your cervix is located at the bottom of your uterus. It opens up to the vagina. CAUSES   Sexually transmitted infections (STIs).   Allergic reaction.   Medicines or birth control devices that are put in the vagina.   Injury to the cervix.   Bacterial infections.  RISK FACTORS You are at greater risk if you:  Have unprotected sexual intercourse.  Have sexual intercourse with many partners.  Began sexual intercourse at an early age.  Have a history of STIs. SYMPTOMS  There may be no symptoms. If symptoms occur, they may include:   Gray, white, yellow, or bad-smelling vaginal discharge.   Pain or itching of the area outside the vagina.   Painful sexual intercourse.   Lower abdominal or lower back pain, especially during intercourse.   Frequent urination.   Abnormal vaginal bleeding between periods, after sexual intercourse, or after menopause.   Pressure or a heavy feeling in the pelvis.  DIAGNOSIS  Diagnosis is made after a pelvic exam. Other tests may include:   Examination of any discharge under a microscope (wet prep).   A Pap test.  TREATMENT  Treatment will depend on the cause of cervicitis. If it is caused by an STI, both you and your partner will need to be treated. Antibiotic medicines will be given.  HOME CARE INSTRUCTIONS   Do not have sexual intercourse until your health care provider says it is okay.   Do not have sexual intercourse until your partner has been treated, if your cervicitis is caused by an STI.   Take your antibiotics as directed. Finish them even if you start to feel better.  SEEK MEDICAL CARE IF:  Your symptoms come back.   You have a fever.  MAKE SURE YOU:   Understand these instructions.  Will watch your condition.  Will get help right away if you are not doing well or get worse.   This information is not intended to replace  advice given to you by your health care provider. Make sure you discuss any questions you have with your health care provider.   Document Released: 06/06/2005 Document Revised: 06/11/2013 Document Reviewed: 11/28/2012 Elsevier Interactive Patient Education 2016 [employer] Ovarian Cyst An ovarian cyst is a fluid-filled sac that forms on an ovary. The ovaries are small organs that produce eggs in women. Various types of cysts can form on the ovaries. Most are not cancerous. Many do not cause problems, and they often go away on their own. Some may cause symptoms and require treatment. Common types of ovarian cysts include:  Functional cysts--These cysts may occur every month during the menstrual cycle. This is normal. The cysts usually go away with the next menstrual cycle if the woman does not get pregnant. Usually, there are no symptoms with a functional cyst.  Endometrioma cysts--These cysts form from the tissue that lines the uterus. They are also called "chocolate cysts" because they become filled with blood that turns brown. This type of cyst can cause pain in the lower abdomen during intercourse and with your menstrual period.  Cystadenoma cysts--This type develops from the cells on the outside of the ovary. These cysts can get very big and cause lower abdomen pain and pain with intercourse. This type of cyst can twist on itself, cut off its blood supply, and cause severe pain. It can also easily rupture and cause a lot of pain.  Dermoid cysts--This type of cyst  is sometimes found in both ovaries. These cysts may contain different kinds of body tissue, such as skin, teeth, hair, or cartilage. They usually do not cause symptoms unless they get very big.  Theca lutein cysts--These cysts occur when too much of a certain hormone (human chorionic gonadotropin) is produced and overstimulates the ovaries to produce an egg. This is most common after procedures used to assist with the conception of a  baby (in vitro fertilization). CAUSES   Fertility drugs can cause a condition in which multiple large cysts are formed on the ovaries. This is called ovarian hyperstimulation syndrome.  A condition called polycystic ovary syndrome can cause hormonal imbalances that can lead to nonfunctional ovarian cysts. SIGNS AND SYMPTOMS  Many ovarian cysts do not cause symptoms. If symptoms are present, they may include:  Pelvic pain or pressure.  Pain in the lower abdomen.  Pain during sexual intercourse.  Increasing girth (swelling) of the abdomen.  Abnormal menstrual periods.  Increasing pain with menstrual periods.  Stopping having menstrual periods without being pregnant. DIAGNOSIS  These cysts are commonly found during a routine or annual pelvic exam. Tests may be ordered to find out more about the cyst. These tests may include:  Ultrasound.  X-ray of the pelvis.  CT scan.  MRI.  Blood tests. TREATMENT  Many ovarian cysts go away on their own without treatment. Your health care provider may want to check your cyst regularly for 2-3 months to see if it changes. For women in menopause, it is particularly important to monitor a cyst closely because of the higher rate of ovarian cancer in menopausal women. When treatment is needed, it may include any of the following:  A procedure to drain the cyst (aspiration). This may be done using a long needle and ultrasound. It can also be done through a laparoscopic procedure. This involves using a thin, lighted tube with a tiny camera on the end (laparoscope) inserted through a small incision.  Surgery to remove the whole cyst. This may be done using laparoscopic surgery or an open surgery involving a larger incision in the lower abdomen.  Hormone treatment or birth control pills. These methods are sometimes used to help dissolve a cyst. HOME CARE INSTRUCTIONS   Only take over-the-counter or prescription medicines as directed by your health  care provider.  Follow up with your health care provider as directed.  Get regular pelvic exams and Pap tests. SEEK MEDICAL CARE IF:   Your periods are late, irregular, or painful, or they stop.  Your pelvic pain or abdominal pain does not go away.  Your abdomen becomes larger or swollen.  You have pressure on your bladder or trouble emptying your bladder completely.  You have pain during sexual intercourse.  You have feelings of fullness, pressure, or discomfort in your stomach.  You lose weight for no apparent reason.  You feel generally ill.  You become constipated.  You lose your appetite.  You develop acne.  You have an increase in body and facial hair.  You are gaining weight, without changing your exercise and eating habits.  You think you are pregnant. SEEK IMMEDIATE MEDICAL CARE IF:   You have increasing abdominal pain.  You feel sick to your stomach (nauseous), and you throw up (vomit).  You develop a fever that comes on suddenly.  You have abdominal pain during a bowel movement.  Your menstrual periods become heavier than usual. MAKE SURE YOU:  Understand these instructions.  Will watch your condition.  Will get help right away if you are not doing well or get worse.   This information is not intended to replace advice given to you by your health care provider. Make sure you discuss any questions you have with your health care provider.   Document Released: 06/06/2005 Document Revised: 06/11/2013 Document Reviewed: 02/11/2013 Elsevier Interactive Patient Education [employer]

## 2015-05-04 NOTE — ED Provider Notes (Signed)
CSN: [id number]     Arrival date & time 05/04/15  7829 History   By signing my name below, I, Arlan Organ, attest that this documentation has been prepared under the direction and in the presence of Loren Racer, MD.  Electronically Signed: Arlan Organ, ED Scribe. 05/04/2015. 3:39 AM.   Chief Complaint  Patient presents with  . Pelvic Pain   The history is provided by the patient. No language interpreter was used.    HPI Comments: Sonya Gibson is a 29 y.o. female with a PMHx of ovarian cysts who presents to the Emergency Department complaining of constant, ongoing, gradually worsening R sided lower abdominal pain x 3-4 days. No aggravating or alleviating factors at this time. Ongoing vaginal discharge, mild spotting, and loose bowel movements also reported. OTC Ibuprofen attempted at home with temporary improvement for symptoms. No recent fever, chills, vomiting, dysuria, or hematuria. Sonya Gibson was recently diagnosed with a yeast infection. Last treatment last night.  PCP: Burman Blacksmith., MD    Past Medical History  Diagnosis Date  . Anxiety   . Depression   . GERD (gastroesophageal reflux disease)    History reviewed. No pertinent past surgical history. No family history on file. Social History  Substance Use Topics  . Smoking status: Never Smoker   . Smokeless tobacco: None  . Alcohol Use: No   OB History    No data available     Review of Systems  Constitutional: Negative for fever and chills.  Respiratory: Negative for cough and shortness of breath.   Cardiovascular: Negative for chest pain.  Gastrointestinal: Positive for nausea and abdominal pain. Negative for vomiting.  Genitourinary: Positive for vaginal bleeding, vaginal discharge and pelvic pain. Negative for dysuria, hematuria and flank pain.  Musculoskeletal: Negative for myalgias, back pain, neck pain and neck stiffness.  Skin: Negative for rash.  Neurological: Negative for dizziness, syncope,  weakness, numbness and headaches.  Psychiatric/Behavioral: Negative for confusion.  All other systems reviewed and are negative.     Allergies  Nitrofurantoin  Home Medications   Prior to Admission medications   Medication Sig Start Date End Date Taking? Authorizing Provider  clonazePAM (KLONOPIN) 1 MG tablet Take 1 mg by mouth 2 (two) times daily.   Yes Historical Provider, MD  DULoxetine (CYMBALTA) 60 MG capsule Take 60 mg by mouth every evening.   Yes Historical Provider, MD  levonorgestrel (MIRENA) 20 MCG/24HR IUD 1 each by Intrauterine route once.   Yes Historical Provider, MD  Melatonin 10 MG TABS Take 10 mg by mouth daily as needed (sleep).   Yes Historical Provider, MD  ondansetron (ZOFRAN ODT) 8 MG disintegrating tablet Take 1 tablet (8 mg total) by mouth every 8 (eight) hours as needed for nausea or vomiting. 12/29/14  Yes Azalia Bilis, MD  traZODone (DESYREL) 150 MG tablet Take 150 mg by mouth at bedtime as needed for sleep.   Yes Historical Provider, MD  zolpidem (AMBIEN) 10 MG tablet Take 10 mg by mouth daily as needed for sleep.   Yes Historical Provider, MD  doxycycline (VIBRAMYCIN) 100 MG capsule Take 1 capsule (100 mg total) by mouth 2 (two) times daily. One po bid x 7 days 05/04/15   Loren Racer, MD  HYDROcodone-acetaminophen [facility]) 5-325 MG tablet Take 1-2 tablets by mouth every 4 (four) hours as needed for severe pain. 05/04/15   Loren Racer, MD   Triage Vitals: BP 134/83 mmHg  Pulse 101  Temp(Src) 97.8 F (36.6 C) (Oral)  Resp  16  Ht 5\' 4"  (1.626 m)  Wt 292 lb (132.45 kg)  BMI 50.10 kg/m2  SpO2 99%   Physical Exam  Constitutional: She is oriented to person, place, and time. She appears well-developed and well-nourished. No distress.  HENT:  Head: Normocephalic and atraumatic.  Mouth/Throat: Oropharynx is clear and moist.  Eyes: EOM are normal. Pupils are equal, round, and reactive to light.  Neck: Normal range of motion. Neck supple.   Cardiovascular: Normal rate and regular rhythm.   Pulmonary/Chest: Effort normal and breath sounds normal. No respiratory distress. She has no wheezes. She has no rales. She exhibits no tenderness.  Abdominal: Soft. Bowel sounds are normal. She exhibits no distension and no mass. There is tenderness. There is no rebound and no guarding.  Patient has pain in the right inguinal region. There is no rebound or guarding.  Genitourinary:  Patient with thick yellow vaginal discharge. Chest tenderness to palpation in the fundal and right adnexal regions.  Musculoskeletal: Normal range of motion. She exhibits no edema or tenderness.  No CVA tenderness bilaterally.  Neurological: She is alert and oriented to person, place, and time.  Moves all extremities without deficit. Sensation is fully intact.  Skin: Skin is warm and dry. No rash noted. No erythema.  Psychiatric: She has a normal mood and affect. Her behavior is normal.  Nursing note and vitals reviewed.   ED Course  Procedures (including critical care time)  DIAGNOSTIC STUDIES: Oxygen Saturation is 97% on RA, adequate by my interpretation.    COORDINATION OF CARE: 3:30 AM-Discussed treatment plan with pt at bedside and pt agreed to plan.     Labs Review Labs Reviewed  WET PREP, GENITAL - Abnormal; Notable for the following:    Clue Cells Wet Prep HPF POC FEW (*)    WBC, Wet Prep HPF POC MANY (*)    All other components within normal limits  CBC WITH DIFFERENTIAL/PLATELET - Abnormal; Notable for the following:    WBC 10.9 (*)    Lymphs Abs 4.5 (*)    All other components within normal limits  COMPREHENSIVE METABOLIC PANEL - Abnormal; Notable for the following:    Glucose, Bld 124 (*)    All other components within normal limits  URINALYSIS, ROUTINE W REFLEX MICROSCOPIC (NOT AT [facility]) - Abnormal; Notable for the following:    APPearance TURBID (*)    Hgb urine dipstick TRACE (*)    Leukocytes, UA LARGE (*)    All other components  within normal limits  URINE MICROSCOPIC-ADD ON - Abnormal; Notable for the following:    Bacteria, UA MANY (*)    All other components within normal limits  RAPID HIV SCREEN (HIV 1/2 AB+AG)  I-STAT BETA HCG BLOOD, ED ([facility], [facility], [facility] ONLY)  GC/CHLAMYDIA PROBE AMP ([place]) NOT AT [facility]    Imaging Review [place] Transvaginal Non-ob  05/04/2015  CLINICAL DATA:  Right adnexal pain for 3 days. EXAM: TRANSABDOMINAL AND TRANSVAGINAL ULTRASOUND OF PELVIS TECHNIQUE: Both transabdominal and transvaginal ultrasound examinations of the pelvis were performed. Transabdominal technique was performed for global imaging of the pelvis including uterus, ovaries, adnexal regions, and pelvic cul-de-sac. It was necessary to proceed with endovaginal exam following the transabdominal exam to visualize the right ovary. COMPARISON:  None FINDINGS: Uterus Measurements: 8.1 x 3.8 x 5.0 cm. Myometrium appears heterogeneous but no fibroids or other mass visualized. Endometrium Thickness: 7.4 mm. Minimal fluid in the endometrial canal in the lower uterine segment. Right ovary Measurements: 4.3 x 2.3  x 2.0 cm. There is a 2.0 x 1.5 x 1.6 cm heterogeneous ovoid region that may reflect a complex cyst. Blood flow seen to the ovarian parenchyma. Left ovary Measurements: 3.8 x 1.8 x 2.8 cm. Normal appearance/no adnexal mass. Left ovary only visualized transabdominally. Other findings No free fluid. IMPRESSION: 1. Probable complex cyst in the right ovary measuring 2 cm. Short-interval follow up ultrasound in 6-12 weeks is recommended, preferably during the week following the patient's normal menses. 2. Heterogeneous myometrial echotexture without discrete fibroid. Trace fluid in the endometrial canal. 3. Normal appearance of the left ovary. Electronically Signed   By: Rubye Oaks M.D.   On: 05/04/2015 06:25   US Pelvis Complete  05/04/2015  CLINICAL DATA:  Right adnexal pain for 3 days. EXAM: TRANSABDOMINAL AND TRANSVAGINAL ULTRASOUND OF  PELVIS TECHNIQUE: Both transabdominal and transvaginal ultrasound examinations of the pelvis were performed. Transabdominal technique was performed for global imaging of the pelvis including uterus, ovaries, adnexal regions, and pelvic cul-de-sac. It was necessary to proceed with endovaginal exam following the transabdominal exam to visualize the right ovary. COMPARISON:  None FINDINGS: Uterus Measurements: 8.1 x 3.8 x 5.0 cm. Myometrium appears heterogeneous but no fibroids or other mass visualized. Endometrium Thickness: 7.4 mm. Minimal fluid in the endometrial canal in the lower uterine segment. Right ovary Measurements: 4.3 x 2.3 x 2.0 cm. There is a 2.0 x 1.5 x 1.6 cm heterogeneous ovoid region that may reflect a complex cyst. Blood flow seen to the ovarian parenchyma. Left ovary Measurements: 3.8 x 1.8 x 2.8 cm. Normal appearance/no adnexal mass. Left ovary only visualized transabdominally. Other findings No free fluid. IMPRESSION: 1. Probable complex cyst in the right ovary measuring 2 cm. Short-interval follow up ultrasound in 6-12 weeks is recommended, preferably during the week following the patient's normal menses. 2. Heterogeneous myometrial echotexture without discrete fibroid. Trace fluid in the endometrial canal. 3. Normal appearance of the left ovary. Electronically Signed   By: Rubye Oaks M.D.   On: 05/04/2015 06:25   I have personally reviewed and evaluated these images and lab results as part of my medical decision-making.   EKG Interpretation None      MDM   Final diagnoses:  Cyst of right ovary  Cervicitis    I personally performed the services described in this documentation, which was scribed in my presence. The recorded information has been reviewed and is accurate.   Patient treated for likely STD. She does have a cyst on the right ovary which is likely the cause of her right-sided pelvic pain.  Abdominal exam is benign. There is no rebound or guarding. I have low  suspicion for appendicitis or intra-abdominal process necessitating CT imaging. She's been advised to have all sexual partners evaluated and treated. Return precautions have been given.  Loren Racer, MD 05/05/15 [phone]

## 2015-05-07 ENCOUNTER — Telehealth (HOSPITAL_BASED_OUTPATIENT_CLINIC_OR_DEPARTMENT_OTHER): Payer: Self-pay | Admitting: Emergency Medicine

## 2016-08-24 ENCOUNTER — Emergency Department (HOSPITAL_COMMUNITY)
Admission: EM | Admit: 2016-08-24 | Discharge: 2016-08-25 | Disposition: A | Discharge status: Home / Self Care | Payer: Self-pay | Attending: Emergency Medicine | Admitting: Emergency Medicine

## 2016-08-24 ENCOUNTER — Emergency Department (HOSPITAL_COMMUNITY): Payer: Self-pay

## 2016-08-24 ENCOUNTER — Encounter (HOSPITAL_COMMUNITY): Payer: Self-pay | Admitting: *Deleted

## 2016-08-24 DIAGNOSIS — R931 Abnormal findings on diagnostic imaging of heart and coronary circulation: Secondary | ICD-10-CM | POA: Insufficient documentation

## 2016-08-24 DIAGNOSIS — N644 Mastodynia: Secondary | ICD-10-CM

## 2016-08-24 DIAGNOSIS — N61 Mastitis without abscess: Secondary | ICD-10-CM | POA: Insufficient documentation

## 2016-08-24 LAB — URINALYSIS, ROUTINE W REFLEX MICROSCOPIC
Bilirubin Urine: NEGATIVE
Glucose, UA: NEGATIVE mg/dL
Hgb urine dipstick: NEGATIVE
Ketones, ur: NEGATIVE mg/dL
LEUKOCYTES UA: NEGATIVE
NITRITE: NEGATIVE
PH: 7 (ref 5.0–8.0)
Protein, ur: NEGATIVE mg/dL
SPECIFIC GRAVITY, URINE: 1.019 (ref 1.005–1.030)

## 2016-08-24 LAB — CBC
HEMATOCRIT: 39.7 % (ref 36.0–46.0)
HEMOGLOBIN: 12.9 g/dL (ref 12.0–15.0)
MCH: 28.4 pg (ref 26.0–34.0)
MCHC: 32.5 g/dL (ref 30.0–36.0)
MCV: 87.4 fL (ref 78.0–100.0)
Platelets: 341 10*3/uL (ref 150–400)
RBC: 4.54 MIL/uL (ref 3.87–5.11)
RDW: 14.6 % (ref 11.5–15.5)
WBC: 8.3 10*3/uL (ref 4.0–10.5)

## 2016-08-24 LAB — COMPREHENSIVE METABOLIC PANEL
ALBUMIN: 3.9 g/dL (ref 3.5–5.0)
ALT: 21 U/L (ref 14–54)
ANION GAP: 10 (ref 5–15)
AST: 19 U/L (ref 15–41)
Alkaline Phosphatase: 69 U/L (ref 38–126)
BILIRUBIN TOTAL: 0.6 mg/dL (ref 0.3–1.2)
BUN: 8 mg/dL (ref 6–20)
CO2: 25 mmol/L (ref 22–32)
Calcium: 9.1 mg/dL (ref 8.9–10.3)
Chloride: 105 mmol/L (ref 101–111)
Creatinine, Ser: 0.76 mg/dL (ref 0.44–1.00)
GFR calc Af Amer: >60 mL/min (ref >60)
GLUCOSE: 84 mg/dL (ref 65–99)
POTASSIUM: 3.5 mmol/L (ref 3.5–5.1)
Sodium: 140 mmol/L (ref 135–145)
TOTAL PROTEIN: 7.4 g/dL (ref 6.5–8.1)

## 2016-08-24 LAB — I-STAT CG4 LACTIC ACID, ED: Lactic Acid, Venous: 1.8 mmol/L (ref 0.5–1.9)

## 2016-08-24 MED ORDER — ONDANSETRON HCL 4 MG/2ML IJ SOLN
4.0000 mg | Freq: Once | INTRAMUSCULAR | Status: AC
  Administered 2016-08-24: 4 mg via INTRAVENOUS
  Filled 2016-08-24: qty 2

## 2016-08-24 MED ORDER — KETOROLAC TROMETHAMINE 15 MG/ML IJ SOLN
15.0000 mg | Freq: Once | INTRAMUSCULAR | Status: AC
  Administered 2016-08-25: 15 mg via INTRAVENOUS
  Filled 2016-08-24: qty 1

## 2016-08-24 MED ORDER — DIPHENHYDRAMINE HCL 50 MG/ML IJ SOLN
25.0000 mg | Freq: Once | INTRAMUSCULAR | Status: AC
  Administered 2016-08-24: 25 mg via INTRAVENOUS
  Filled 2016-08-24: qty 1

## 2016-08-24 MED ORDER — SODIUM CHLORIDE 0.9 % IV BOLUS (SEPSIS)
1000.0000 mL | Freq: Once | INTRAVENOUS | Status: AC
  Administered 2016-08-24: 1000 mL via INTRAVENOUS

## 2016-08-24 MED ORDER — HYDROMORPHONE HCL 2 MG/ML IJ SOLN
1.0000 mg | Freq: Once | INTRAMUSCULAR | Status: AC
  Administered 2016-08-24: 1 mg via INTRAVENOUS
  Filled 2016-08-24: qty 1

## 2016-08-24 MED ORDER — IOPAMIDOL (ISOVUE-300) INJECTION 61%
INTRAVENOUS | Status: AC
  Administered 2016-08-24: 75 mL
  Filled 2016-08-24: qty 75

## 2016-08-24 NOTE — ED Provider Notes (Signed)
[facility]-EMERGENCY DEPT Provider Note   CSN: [id number] Arrival date & time: 08/24/16  1734     History   Chief Complaint Chief Complaint  Patient presents with  . Fever    HPI Sonya ChesterHolly E Gibson is a 31 y.o. female.  HPI 31 year old female with history of anxiety and depression as well as morbid obesity who presents with breast discharge and pain. Patient recently underwent punch biopsy of her breast past week for possible breast cancer. The biopsies returned negative. Since then, the patient has had increasingly severe purulent discharge from her bilateral puncture sites. She saw her surgeon who did not place her and any antibiotics and sent her home. Since then, she has had increasing redness and discharge from her breasts. The discharge is worse with any movement as well as palpation. She also endorses an aching, throbbing, generalized breast pain. She has had subjective fevers and chills at home. She has previously been on clindamycin for this infection prior to the biopsy but has not had any antibiotics in the last 2 weeks. She did have some mild nausea but no vomiting.  Past Medical History:  Diagnosis Date  . Anxiety   . Depression   . GERD (gastroesophageal reflux disease)     Patient Active Problem List   Diagnosis Date Noted  . GESTATIONAL DIABETES 04/26/2010    History reviewed. No pertinent surgical history.  OB History    No data available       Home Medications    Prior to Admission medications   Medication Sig Start Date End Date Taking? Authorizing Provider  cephALEXin (KEFLEX) 500 MG capsule Take 1 capsule (500 mg total) by mouth 3 (three) times daily. 08/25/16 09/04/16  Shaune Pollackameron Jocie Meroney, MD  clonazePAM (KLONOPIN) 1 MG tablet Take 1 mg by mouth 2 (two) times daily.    Historical Provider, MD  doxycycline (VIBRAMYCIN) 100 MG capsule Take 1 capsule (100 mg total) by mouth 2 (two) times daily. One po bid x 7 days 05/04/15   Loren Raceravid Yelverton, MD  DULoxetine (CYMBALTA)  60 MG capsule Take 60 mg by mouth every evening.    Historical Provider, MD  HYDROcodone-acetaminophen (NORCO) 5-325 MG tablet Take 1-2 tablets by mouth every 4 (four) hours as needed for severe pain. 05/04/15   Loren Raceravid Yelverton, MD  levonorgestrel (MIRENA) 20 MCG/24HR IUD 1 each by Intrauterine route once.    Historical Provider, MD  Melatonin 10 MG TABS Take 10 mg by mouth daily as needed (sleep).    Historical Provider, MD  naproxen (NAPROSYN) 500 MG tablet Take 1 tablet (500 mg total) by mouth 2 (two) times daily as needed for moderate pain. 08/25/16 09/01/16  Shaune Pollackameron Tishana Clinkenbeard, MD  ondansetron (ZOFRAN ODT) 8 MG disintegrating tablet Take 1 tablet (8 mg total) by mouth every 8 (eight) hours as needed for nausea or vomiting. 12/29/14   Azalia BilisKevin Campos, MD  oxyCODONE-acetaminophen (PERCOCET/ROXICET) 5-325 MG tablet Take 1-2 tablets by mouth every 4 (four) hours as needed for severe pain. 08/25/16   Shaune Pollackameron Klyde Banka, MD  sulfamethoxazole-trimethoprim (BACTRIM DS,SEPTRA DS) 800-160 MG tablet Take 1 tablet by mouth 2 (two) times daily. 08/25/16 09/04/16  Shaune Pollackameron Priseis Cratty, MD  traZODone (DESYREL) 150 MG tablet Take 150 mg by mouth at bedtime as needed for sleep.    Historical Provider, MD  zolpidem (AMBIEN) 10 MG tablet Take 10 mg by mouth daily as needed for sleep.    Historical Provider, MD    Family History No family history on file.  Social History  Social History  Substance Use Topics  . Smoking status: Never Smoker  . Smokeless tobacco: Never Used  . Alcohol use No     Allergies   Nitrofurantoin   Review of Systems Review of Systems  Constitutional: Positive for chills and fatigue. Negative for fever.  HENT: Negative for congestion, rhinorrhea and sore throat.   Eyes: Negative for visual disturbance.  Respiratory: Negative for cough, shortness of breath and wheezing.   Cardiovascular: Negative for chest pain and leg swelling.  Gastrointestinal: Positive for nausea. Negative for abdominal pain,  diarrhea and vomiting.  Genitourinary: Negative for dysuria, flank pain, vaginal bleeding and vaginal discharge.  Musculoskeletal: Negative for neck pain and neck stiffness.  Skin: Positive for rash and wound.  Allergic/Immunologic: Negative for immunocompromised state.  Neurological: Negative for syncope, weakness and headaches.  Hematological: Does not bruise/bleed easily.  All other systems reviewed and are negative.    Physical Exam Updated Vital Signs BP 128/90   Pulse 102   Temp 99 F (37.2 C) (Oral)   Resp 19   Ht 5\' 2"  (1.575 m)   Wt 288 lb (130.6 kg)   LMP 08/22/2016   SpO2 97%   BMI 52.68 kg/m   Physical Exam  Constitutional: She is oriented to person, place, and time. She appears well-developed and well-nourished. No distress.  HENT:  Head: Normocephalic and atraumatic.  Eyes: Conjunctivae are normal.  Neck: Neck supple.  Cardiovascular: Normal rate, regular rhythm and normal heart sounds.  Exam reveals no friction rub.   No murmur heard. Pulmonary/Chest: Effort normal and breath sounds normal. No respiratory distress. She has no wheezes. She has no rales.  Abdominal: She exhibits no distension.  Musculoskeletal: She exhibits no edema.  Neurological: She is alert and oriented to person, place, and time. She exhibits normal muscle tone.  Skin: Skin is warm. Capillary refill takes less than 2 seconds.  Punctate biopsy locations to bilateral aspects of left areola, with moderate surrounding erythema and expressible cloudy yellow drainage. No fluctuance. Minimal surrounding erythema but tenderness extends throughout breast. No nipple discharge.  Psychiatric: She has a normal mood and affect.  Nursing note and vitals reviewed.    ED Treatments / Results  Labs (all labs ordered are listed, but only abnormal results are displayed) Labs Reviewed  URINALYSIS, ROUTINE W REFLEX MICROSCOPIC - Abnormal; Notable for the following:       Result Value   APPearance CLOUDY  (*)    All other components within normal limits  COMPREHENSIVE METABOLIC PANEL  CBC  POC URINE PREG, ED  I-STAT CG4 LACTIC ACID, ED  I-STAT CG4 LACTIC ACID, ED    EKG  EKG Interpretation None       Radiology Dg Chest 2 View  Result Date: 08/24/2016 CLINICAL DATA:  History of left breast biopsy with fever and drainage from the breast EXAM: CHEST  2 VIEW COMPARISON:  05/12/2014 FINDINGS: The heart size and mediastinal contours are within normal limits. Both lungs are clear. The visualized skeletal structures are unremarkable. IMPRESSION: No active cardiopulmonary disease. Electronically Signed   By: Jasmine Pang M.D.   On: 08/24/2016 19:07   Ct Chest W Contrast  Result Date: 08/24/2016 CLINICAL DATA:  Breast pain.  Concern for abscess. EXAM: CT CHEST WITH CONTRAST TECHNIQUE: Multidetector CT imaging of the chest was performed during intravenous contrast administration. CONTRAST:  75mL ISOVUE-300 IOPAMIDOL (ISOVUE-300) INJECTION 61% COMPARISON:  Radiographs 08/24/2016.  CT 06/30/2016 FINDINGS: Cardiovascular: No significant vascular findings. Normal heart size. No pericardial  effusion. The thoracic aorta is normal in caliber, without significant atherosclerotic changes. Mediastinum/Nodes: No enlarged mediastinal, hilar, or axillary lymph nodes. Thyroid gland, trachea, and esophagus demonstrate no significant findings. Lungs/Pleura: Lungs are clear. No pleural effusion or pneumothorax. Upper Abdomen: No acute findings Musculoskeletal: No significant skeletal lesions. There is skin thickening and irregularity of the left breast. No drainable collection. IMPRESSION: Irregular skin thickening of the left breast without drainable abscess. Cellulitis may produce this appearance, but neoplasm cannot be excluded. Electronically Signed   By: Ellery Plunk M.D.   On: 08/24/2016 23:57    Procedures Procedures (including critical care time)  Medications Ordered in ED Medications  vancomycin  (VANCOCIN) 2,000 mg in sodium chloride 0.9 % 500 mL IVPB (not administered)  HYDROmorphone (DILAUDID) injection 1 mg (not administered)  sodium chloride 0.9 % bolus 1,000 mL (0 mLs Intravenous Stopped 08/24/16 2340)  HYDROmorphone (DILAUDID) injection 1 mg (1 mg Intravenous Given 08/24/16 2235)  diphenhydrAMINE (BENADRYL) injection 25 mg (25 mg Intravenous Given 08/24/16 2235)  ondansetron (ZOFRAN) injection 4 mg (4 mg Intravenous Given 08/24/16 2235)  iopamidol (ISOVUE-300) 61 % injection (75 mLs  Contrast Given 08/24/16 2249)  ketorolac (TORADOL) 15 MG/ML injection 15 mg (15 mg Intravenous Given 08/25/16 0010)     Initial Impression / Assessment and Plan / ED Course  I have reviewed the triage vital signs and the nursing notes.  Pertinent labs & imaging results that were available during my care of the patient were reviewed by me and considered in my medical decision making (see chart for details).     31 yo F with PMHx as above here with left breast swellingStatus post recent biopsy. On exam, she does have expressible purulent discharge as well as diffuse tenderness. Given concern for deep abscess, CT scan obtained and is consistent with cellulitis, but fortunately shows no evidence of abscess. Otherwise, she has a normal white blood cell count as well as normal lactic acid. Given her persistent symptoms and severity of pain as well as copious discharge, will cover for possible MRSA cellulitis with dose of vancomycin and Keflex/Bactrim as an outpatient. Otherwise, she has not recently been on antibiotics in the last 2 weeks, and I believe a trial of outpatient antibiotics is reasonable given the absence of any systemic signs of infection. Will advise her to stay off of work as she does have a component of dermatitis per review of records, and I suspect this is causing continued skin irritation, and discharge home.  Final Clinical Impressions(s) / ED Diagnoses   Final diagnoses:  Cellulitis of breast    Breast pain    New Prescriptions New Prescriptions   CEPHALEXIN (KEFLEX) 500 MG CAPSULE    Take 1 capsule (500 mg total) by mouth 3 (three) times daily.   NAPROXEN (NAPROSYN) 500 MG TABLET    Take 1 tablet (500 mg total) by mouth 2 (two) times daily as needed for moderate pain.   OXYCODONE-ACETAMINOPHEN (PERCOCET/ROXICET) 5-325 MG TABLET    Take 1-2 tablets by mouth every 4 (four) hours as needed for severe pain.   SULFAMETHOXAZOLE-TRIMETHOPRIM (BACTRIM DS,SEPTRA DS) 800-160 MG TABLET    Take 1 tablet by mouth 2 (two) times daily.     Shaune Pollack, MD 08/25/16 [phone]

## 2016-08-24 NOTE — ED Triage Notes (Signed)
The pt had a punch biopsy x 2 last Monday a weeks ago  Since this past Monday  She has had drainage from her lt breast where the punch biopsy was done.  There is no redness or swelling but there is purulent appearing drainage draining frolm an area on her lt breast  She had this procedure in [place] by a surgeon there  She has been on oral antibiotics 2 rounds

## 2016-08-25 MED ORDER — FLUCONAZOLE 150 MG PO TABS: 150.0000 mg | ORAL_TABLET | Freq: Once | ORAL | 0 refills | Status: AC

## 2016-08-25 MED ORDER — HYDROMORPHONE HCL 2 MG/ML IJ SOLN
1.0000 mg | Freq: Once | INTRAMUSCULAR | Status: AC
  Administered 2016-08-25: 1 mg via INTRAVENOUS
  Filled 2016-08-25: qty 1

## 2016-08-25 MED ORDER — IBUPROFEN 800 MG PO TABS
800.0000 mg | ORAL_TABLET | Freq: Once | ORAL | Status: AC
  Administered 2016-08-25: 800 mg via ORAL
  Filled 2016-08-25: qty 1

## 2016-08-25 MED ORDER — ONDANSETRON HCL 4 MG/2ML IJ SOLN
4.0000 mg | Freq: Once | INTRAMUSCULAR | Status: AC
  Administered 2016-08-25: 4 mg via INTRAVENOUS
  Filled 2016-08-25: qty 2

## 2016-08-25 MED ORDER — VANCOMYCIN HCL 10 G IV SOLR
2000.0000 mg | Freq: Once | INTRAVENOUS | Status: AC
  Administered 2016-08-25: 2000 mg via INTRAVENOUS
  Filled 2016-08-25: qty 2000

## 2016-08-25 MED ORDER — CEPHALEXIN 500 MG PO CAPS: 500.0000 mg | ORAL_CAPSULE | Freq: Three times a day (TID) | ORAL | 0 refills | Status: AC

## 2016-08-25 MED ORDER — SULFAMETHOXAZOLE-TRIMETHOPRIM 800-160 MG PO TABS: 1.0000 | ORAL_TABLET | Freq: Two times a day (BID) | ORAL | 0 refills | Status: AC

## 2016-08-25 MED ORDER — NAPROXEN 500 MG PO TABS: 500.0000 mg | ORAL_TABLET | Freq: Two times a day (BID) | ORAL | 0 refills | Status: AC | PRN

## 2016-08-25 MED ORDER — OXYCODONE-ACETAMINOPHEN 5-325 MG PO TABS: 1.0000 | ORAL_TABLET | ORAL | 0 refills | Status: DC | PRN

## 2016-08-25 MED ORDER — LORAZEPAM 2 MG/ML IJ SOLN
1.0000 mg | Freq: Four times a day (QID) | INTRAMUSCULAR | Status: DC | PRN
  Administered 2016-08-25: 1 mg via INTRAVENOUS
  Filled 2016-08-25: qty 1

## 2016-08-25 NOTE — ED Notes (Signed)
Pt visibly anxious and upset, pt tearful and states "I don't know why I'm crying I'm just really anxious and upset being here." Greta DoomBowie, PA aware.

## 2016-08-25 NOTE — ED Notes (Signed)
ED Provider at bedside. 

## 2016-08-25 NOTE — ED Notes (Signed)
Called pharmacy to follow up on pt vancomycin, per pharmacy will tube to Pod E

## 2016-08-25 NOTE — ED Notes (Signed)
Pt is still visibly anxious. Pt repositioned, given pillow, and lights turned off. Pt also given two warm compresses for L breast pain. Per pt, stepfather and mother are on the way.

## 2017-01-06 DIAGNOSIS — K219 Gastro-esophageal reflux disease without esophagitis: Secondary | ICD-10-CM | POA: Insufficient documentation

## 2017-03-01 ENCOUNTER — Encounter (HOSPITAL_COMMUNITY): Payer: Self-pay | Admitting: Emergency Medicine

## 2017-03-01 ENCOUNTER — Emergency Department (HOSPITAL_COMMUNITY)
Admission: EM | Admit: 2017-03-01 | Discharge: 2017-03-01 | Disposition: A | Discharge status: Home / Self Care | Payer: Self-pay | Attending: Emergency Medicine | Admitting: Emergency Medicine

## 2017-03-01 DIAGNOSIS — N644 Mastodynia: Secondary | ICD-10-CM | POA: Insufficient documentation

## 2017-03-01 DIAGNOSIS — Z79899 Other long term (current) drug therapy: Secondary | ICD-10-CM | POA: Insufficient documentation

## 2017-03-01 LAB — CBC WITH DIFFERENTIAL/PLATELET
Basophils Absolute: 0 10*3/uL (ref 0.0–0.1)
Basophils Relative: 0 %
Eosinophils Absolute: 0.3 10*3/uL (ref 0.0–0.7)
Eosinophils Relative: 4 %
HCT: 38.3 % (ref 36.0–46.0)
Hemoglobin: 12.6 g/dL (ref 12.0–15.0)
Lymphocytes Relative: 35 %
Lymphs Abs: 2.9 10*3/uL (ref 0.7–4.0)
MCH: 29 pg (ref 26.0–34.0)
MCHC: 32.9 g/dL (ref 30.0–36.0)
MCV: 88 fL (ref 78.0–100.0)
Monocytes Absolute: 0.4 10*3/uL (ref 0.1–1.0)
Monocytes Relative: 5 %
Neutro Abs: 4.6 10*3/uL (ref 1.7–7.7)
Neutrophils Relative %: 56 %
Platelets: 330 10*3/uL (ref 150–400)
RBC: 4.35 MIL/uL (ref 3.87–5.11)
RDW: 15.3 % (ref 11.5–15.5)
WBC: 8.1 10*3/uL (ref 4.0–10.5)

## 2017-03-01 LAB — BASIC METABOLIC PANEL
Anion gap: 8 (ref 5–15)
BUN: 9 mg/dL (ref 6–20)
CO2: 28 mmol/L (ref 22–32)
Calcium: 8.7 mg/dL — ABNORMAL LOW (ref 8.9–10.3)
Chloride: 101 mmol/L (ref 101–111)
Creatinine, Ser: 0.81 mg/dL (ref 0.44–1.00)
GFR calc Af Amer: >60 mL/min (ref >60)
GFR calc non Af Amer: >60 mL/min (ref >60)
Glucose, Bld: 97 mg/dL (ref 65–99)
Potassium: 4.3 mmol/L (ref 3.5–5.1)
Sodium: 137 mmol/L (ref 135–145)

## 2017-03-01 MED ORDER — NAPROXEN 500 MG PO TABS: 500.0000 mg | ORAL_TABLET | Freq: Two times a day (BID) | ORAL | 0 refills | Status: DC

## 2017-03-01 MED ORDER — FENTANYL CITRATE (PF) 100 MCG/2ML IJ SOLN
50.0000 ug | Freq: Once | INTRAMUSCULAR | Status: AC
  Administered 2017-03-01: 50 ug via INTRAVENOUS
  Filled 2017-03-01: qty 2

## 2017-03-01 MED ORDER — SODIUM CHLORIDE 0.9 % IV SOLN
INTRAVENOUS | Status: DC
  Administered 2017-03-01: 15:00:00 via INTRAVENOUS

## 2017-03-01 MED ORDER — VANCOMYCIN HCL IN DEXTROSE 1-5 GM/200ML-% IV SOLN
1000.0000 mg | Freq: Once | INTRAVENOUS | Status: AC
  Administered 2017-03-01: 1000 mg via INTRAVENOUS
  Filled 2017-03-01: qty 200

## 2017-03-01 MED ORDER — ONDANSETRON HCL 4 MG/2ML IJ SOLN
4.0000 mg | Freq: Once | INTRAMUSCULAR | Status: AC
  Administered 2017-03-01: 4 mg via INTRAVENOUS
  Filled 2017-03-01: qty 2

## 2017-03-01 MED ORDER — SODIUM CHLORIDE 0.9 % IV BOLUS (SEPSIS)
500.0000 mL | Freq: Once | INTRAVENOUS | Status: AC
  Administered 2017-03-01: 500 mL via INTRAVENOUS

## 2017-03-01 MED ORDER — HYDROCODONE-ACETAMINOPHEN 5-325 MG PO TABS: 1.0000 | ORAL_TABLET | Freq: Four times a day (QID) | ORAL | 0 refills | Status: DC | PRN

## 2017-03-01 MED ORDER — FLUCONAZOLE 100 MG PO TABS
150.0000 mg | ORAL_TABLET | Freq: Once | ORAL | Status: AC
  Administered 2017-03-01: 150 mg via ORAL
  Filled 2017-03-01: qty 2

## 2017-03-01 MED ORDER — CEPHALEXIN 500 MG PO CAPS: 500.0000 mg | ORAL_CAPSULE | Freq: Four times a day (QID) | ORAL | 0 refills | Status: DC

## 2017-03-01 NOTE — ED Provider Notes (Signed)
[facility]-EMERGENCY DEPT Provider Note   CSN: [id number] Arrival date & time: 03/01/17  1216     History   Chief Complaint Chief Complaint  Patient presents with  . Breast Discharge    HPI Sonya Gibson is a 31 y.o. female.  Patient with about a 6 week history of pain to right breast. [place] Patient treated with vancomycin IV at one point in time with some improvement started on clindamycin. But symptoms started to get worse despite being on the clindamycin towards the end of August. Second half of August patient was evaluated [facility] without any concerns for tumor. Patient has not had any redness on the surface of the breast. In the past about a year ago patient had a breast abscess to the left breast. Which states that this is acting different. Increased breast pain since the end of August. Patient has a pus discharge from the nipple. Hence I also has some of the same discharge from old site where she had a piercing.      Past Medical History:  Diagnosis Date  . Anxiety   . Depression   . GERD (gastroesophageal reflux disease)     Patient Active Problem List   Diagnosis Date Noted  . GESTATIONAL DIABETES 04/26/2010    Past Surgical History:  Procedure Laterality Date  . ANTERIOR CRUCIATE LIGAMENT REPAIR Left   . CESAREAN SECTION    . middle finger reatachment      OB History    No data available       Home Medications    Prior to Admission medications   Medication Sig Start Date End Date Taking? Authorizing Provider  clonazePAM (KLONOPIN) 2 MG tablet Take 1 mg by mouth 2 (two) times daily.   Yes [provider]  eszopiclone (LUNESTA) 2 MG TABS tablet Take 2 mg by mouth at bedtime as needed for sleep. Take immediately before bedtime   Yes [provider]  ibuprofen (ADVIL,MOTRIN) 200 MG tablet Take 1,000 mg by mouth 2 (two) times daily.    Yes [provider]  Melatonin 10 MG TABS Take 10 mg by mouth daily as needed (sleep).   Yes [provider]  norethindrone (MICRONOR,CAMILA,ERRIN) 0.35 MG tablet Take 1 tablet by mouth daily.   Yes [provider]  ondansetron (ZOFRAN ODT) 8 MG disintegrating tablet Take 1 tablet (8 mg total) by mouth every 8 (eight) hours as needed for nausea or vomiting. 12/29/14  Yes Azalia Bilisampos, Kevin, MD  oxyCODONE-acetaminophen (PERCOCET/ROXICET) 5-325 MG tablet Take 1-2 tablets by mouth every 4 (four) hours as needed for severe pain. 08/25/16  Yes Shaune PollackIsaacs, Cameron, MD  traZODone (DESYREL) 150 MG tablet Take 150 mg by mouth at bedtime as needed for sleep.   Yes [provider]  venlafaxine XR (EFFEXOR-XR) 150 MG 24 hr capsule Take 300 mg by mouth daily. 02/07/17 06/07/17 Yes [provider]  cephALEXin (KEFLEX) 500 MG capsule Take 1 capsule (500 mg total) by mouth 4 (four) times daily. 03/01/17   Vanetta MuldersZackowski, Kasheena Sambrano, MD  HYDROcodone-acetaminophen (NORCO/VICODIN) 5-325 MG tablet Take 1-2 tablets by mouth every 6 (six) hours as needed. 03/01/17   Vanetta MuldersZackowski, Langley Flatley, MD  naproxen (NAPROSYN) 500 MG tablet Take 1 tablet (500 mg total) by mouth 2 (two) times daily. 03/01/17   Vanetta MuldersZackowski, Cherylee Rawlinson, MD    Family History History reviewed. No pertinent family history.  Social History Social History  Substance Use Topics  . Smoking status: Never Smoker  . Smokeless tobacco: Never Used  . Alcohol use Yes     Comment: rare     Allergies   Coconut oil and Nitrofurantoin   Review of Systems Review of Systems  Constitutional: Positive for chills.  HENT: Negative for congestion.   Eyes: Negative for visual disturbance.  Respiratory: Negative for shortness of breath.   Cardiovascular: Negative for chest pain.  Gastrointestinal: Positive for nausea and vomiting.  Genitourinary: Negative for dysuria.  Musculoskeletal: Negative for myalgias.  Skin: Negative for rash.  Neurological:  Negative for headaches.  Hematological: Does not bruise/bleed easily.  Psychiatric/Behavioral: Negative for confusion.     Physical Exam Updated Vital Signs BP 115/75   Pulse 88   Temp 97.8 F (36.6 C)   Resp 18   Ht 1.549 m ( )   Wt 136.1 kg (300 lb)   LMP 02/21/2017   SpO2 97%   BMI 56.68 kg/m   Physical Exam  Constitutional: She is oriented to person, place, and time. She appears well-developed and well-nourished. No distress.  HENT:  Head: Normocephalic and atraumatic.  Mouth/Throat: Oropharynx is clear and moist.  Eyes: Pupils are equal, round, and reactive to light. Conjunctivae and EOM are normal.  Neck: Normal range of motion. Neck supple.  Cardiovascular: Normal rate, regular rhythm and normal heart sounds.   Pulmonary/Chest: Effort normal and breath sounds normal. No respiratory distress.  Right breast with tenderness no discharge no erythema tenderness is around the areolar area. Area of firmness in that area. No fluctuance. No obvious adenopathy in the axillary area.  Abdominal: Soft. Bowel sounds are normal. There is no tenderness.  Neurological: She is alert and oriented to person, place, and time. No cranial nerve deficit or sensory deficit. She exhibits normal muscle tone. Coordination normal.  Skin: Skin is warm. No rash noted. No erythema.  Nursing note and vitals reviewed.    ED Treatments / Results  Labs (all labs ordered are listed, but only abnormal results are displayed) Labs Reviewed  BASIC METABOLIC PANEL - Abnormal; Notable for the following:       Result Value   Calcium 8.7 (*)    All other components within normal limits  CBC WITH DIFFERENTIAL/PLATELET    EKG  EKG Interpretation None       Radiology No results found.  Procedures Procedures (including critical care time)  Medications Ordered in ED Medications  0.9 %  sodium chloride infusion ( Intravenous New Bag/Given 03/01/17 1430)  sodium chloride 0.9 % bolus 500 mL (0  mLs Intravenous Stopped 03/01/17 1540)  ondansetron (ZOFRAN) injection 4 mg (4 mg Intravenous Given 03/01/17 1423)  fentaNYL (SUBLIMAZE) injection 50 mcg (50 mcg Intravenous Given 03/01/17 1423)  vancomycin (VANCOCIN) IVPB 1000 mg/200 mL premix (0 mg Intravenous Stopped 03/01/17 1542)  fluconazole (DIFLUCAN) tablet 150 mg (150 mg Oral Given 03/01/17 1423)  fentaNYL (SUBLIMAZE) injection 50 mcg (50 mcg Intravenous Given 03/01/17 1653)     Initial Impression / Assessment and Plan / ED Course  I have reviewed the triage vital signs and the nursing notes.  Pertinent labs & imaging results that were available during my care of the patient were reviewed by me and considered in my medical decision making (see chart for details).    Patient with a persistent 6 week history of right breast pain. Extensive evaluation at [facility] to rule out any concerns for tumor. That included the 3-D mammogram as well as ultrasound. Patient  has been on antibiotics since the beginning of August. At the end of August the pain started to increase initially pain seemed to be improving. Patient has a history of a breast abscess in the left breast a year ago but patient states this seems to be very different. No one's been able to explain why there is a purulent discharge from the nipple on the right side. Examination here today shows no evidence of erythema cellulitis or distinct induration or fluctuance. No discharge elicited here today.  Will add Keflex to her regimen of clindamycin and have her follow back up with her primary care doctor. We'll also give her referrals locally to OB/GYN and if needed general surgery.  Patient's labs without and evidence of any significant leukocytosis vital signs without evidence of anything concerning for sepsis. Patient appears nontoxic.  Final Clinical Impressions(s) / ED Diagnoses   Final diagnoses:  Breast pain, right    New Prescriptions New Prescriptions   CEPHALEXIN (KEFLEX) 500  MG CAPSULE    Take 1 capsule (500 mg total) by mouth 4 (four) times daily.   HYDROCODONE-ACETAMINOPHEN (NORCO/VICODIN) 5-325 MG TABLET    Take 1-2 tablets by mouth every 6 (six) hours as needed.   NAPROXEN (NAPROSYN) 500 MG TABLET    Take 1 tablet (500 mg total) by mouth 2 (two) times daily.     Vanetta Mulders, MD 03/01/17 [phone]

## 2017-03-01 NOTE — ED Triage Notes (Signed)
Pt reports seen for same since August 1. Pt reports has been given IV abx at the beginning of August. Pt prescribed oral abx daily since August. Pt reports intermittent pain and discharge in right breast. Pt reports has been referred and cleared by cancer center. nad noted. Pt reports nausea, emesis, chills.

## 2017-03-01 NOTE — Discharge Instructions (Signed)
Continue taking the clindamycin. Start taking the Keflex. Contact your primary care doctor for additional follow-up. Referrals locally provided to OB/GYN and to general surgery. I would make the appointment to follow-up with OB/GYN first. Take the Naprosyn on a regular basis. Supplement with the hydrocodone as needed for pain relief.

## 2017-03-10 ENCOUNTER — Emergency Department (HOSPITAL_COMMUNITY)
Admission: EM | Admit: 2017-03-10 | Discharge: 2017-03-10 | Disposition: A | Discharge status: Home / Self Care | Payer: Self-pay | Attending: Emergency Medicine | Admitting: Emergency Medicine

## 2017-03-10 DIAGNOSIS — Z79899 Other long term (current) drug therapy: Secondary | ICD-10-CM | POA: Insufficient documentation

## 2017-03-10 DIAGNOSIS — N644 Mastodynia: Secondary | ICD-10-CM | POA: Insufficient documentation

## 2017-03-10 DIAGNOSIS — N6452 Nipple discharge: Secondary | ICD-10-CM | POA: Insufficient documentation

## 2017-03-10 LAB — CBC WITH DIFFERENTIAL/PLATELET
Basophils Absolute: 0 10*3/uL (ref 0.0–0.1)
Basophils Relative: 0 %
EOS ABS: 0.2 10*3/uL (ref 0.0–0.7)
Eosinophils Relative: 3 %
HCT: 35.7 % — ABNORMAL LOW (ref 36.0–46.0)
HEMOGLOBIN: 11.7 g/dL — AB (ref 12.0–15.0)
LYMPHS ABS: 3.4 10*3/uL (ref 0.7–4.0)
Lymphocytes Relative: 45 %
MCH: 29 pg (ref 26.0–34.0)
MCHC: 32.8 g/dL (ref 30.0–36.0)
MCV: 88.6 fL (ref 78.0–100.0)
MONO ABS: 0.5 10*3/uL (ref 0.1–1.0)
MONOS PCT: 6 %
NEUTROS PCT: 46 %
Neutro Abs: 3.5 10*3/uL (ref 1.7–7.7)
Platelets: 317 10*3/uL (ref 150–400)
RBC: 4.03 MIL/uL (ref 3.87–5.11)
RDW: 15.5 % (ref 11.5–15.5)
WBC: 7.6 10*3/uL (ref 4.0–10.5)

## 2017-03-10 LAB — BASIC METABOLIC PANEL
Anion gap: 7 (ref 5–15)
BUN: 12 mg/dL (ref 6–20)
CO2: 27 mmol/L (ref 22–32)
CREATININE: 0.8 mg/dL (ref 0.44–1.00)
Calcium: 8.8 mg/dL — ABNORMAL LOW (ref 8.9–10.3)
Chloride: 103 mmol/L (ref 101–111)
GFR calc Af Amer: >60 mL/min (ref >60)
GFR calc non Af Amer: >60 mL/min (ref >60)
GLUCOSE: 127 mg/dL — AB (ref 65–99)
Potassium: 3.9 mmol/L (ref 3.5–5.1)
SODIUM: 137 mmol/L (ref 135–145)

## 2017-03-10 MED ORDER — KETOROLAC TROMETHAMINE 30 MG/ML IJ SOLN
30.0000 mg | Freq: Once | INTRAMUSCULAR | Status: AC
Start: 2017-03-10 — End: 2017-03-10
  Administered 2017-03-10: 30 mg via INTRAVENOUS
  Filled 2017-03-10: qty 1

## 2017-03-10 MED ORDER — SULFAMETHOXAZOLE-TRIMETHOPRIM 800-160 MG PO TABS: 1.0000 | ORAL_TABLET | Freq: Two times a day (BID) | ORAL | 0 refills | Status: DC

## 2017-03-10 MED ORDER — FLUCONAZOLE 150 MG PO TABS: 150.0000 mg | ORAL_TABLET | Freq: Once | ORAL | 0 refills | Status: AC

## 2017-03-10 NOTE — ED Provider Notes (Signed)
[facility]-EM[facility]Y DEPT Provider Note   CSN: [id number] Arrival date & time: 03/10/17  0038  Time seen 02:05 AM   History   Chief Complaint Chief Complaint  Patient presents with  . Breast Pain    HPI Sonya Gibson is a 31 y.o. female.  HPI  Patient reports about 6 weeks ago she had redness and swelling of her right breast up into her right axilla. She states she's been having blood and pus coming from her right nipple. She states she went to [facility] on August 1and got vancomycin 2 g IV and then was treated with oral clindamycin and Diflucan and pain medication.she followed up with her primary care Dr. Deretha [employer] about 3 weeks ago and was referred to the cancer center where she had a mammogram done on August 23 and 29th. They states she did not have cancer and they put her back on the clindamycin and Diflucan and pain pills. She states it didn't get better and she went to [facility] and she was told her CT scanner was down and they only did ultrasound on cancer patients so she came here and was seen here on September 12. She was given vancomycin 2 g IV and in addition to her clindamycin Keflex was added. She states it still draining. She states the pressure dressing she has on it now makes it feel better. She's had nausea and states she is vomiting 2-5 times a day. She states she had fevers undocumented and a week ago started having chills. She states she had a history of nipple piercings many years ago however she's taken the ring out along time ago before that. 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 She denies any history of boils or abscesses. She states her immunizations including tetanus are up-to-date.she states she's borderline diabetic and her doctor started her on metformin for PCOS. Patient is very careful in telling me how many pain pills she was prescribed each visit, and she states she has "plenty of  pain pills at home because she doesn't like to take them.  PCP Rhodia Albright, NP   Past Medical History:  Diagnosis Date  . Anxiety   . Depression   . GERD (gastroesophageal reflux disease)     Patient Active Problem List   Diagnosis Date Noted  . GESTATIONAL DIABETES 04/26/2010    Past Surgical History:  Procedure Laterality Date  . ANTERIOR CRUCIATE LIGAMENT REPAIR Left   . CESAREAN SECTION    . middle finger reatachment      OB History    No data available       Home Medications    Prior to Admission medications   Medication Sig Start Date End Date Taking? Authorizing Provider  cephALEXin (KEFLEX) 500 MG capsule Take 1 capsule (500 mg total) by mouth 4 (four) times daily. 03/01/17   Vanetta Mulders, MD  clonazePAM (KLONOPIN) 2 MG tablet Take 1 mg by mouth 2 (two) times daily.    [provider]  eszopiclone (LUNESTA) 2 MG TABS tablet Take 2 mg by mouth at bedtime as needed for sleep. Take immediately before bedtime    [provider]  HYDROcodone-acetaminophen (NORCO/VICODIN) 5-325 MG tablet Take 1-2 tablets by mouth every 6 (six) hours as needed. 03/01/17   Vanetta Mulders, MD  ibuprofen (ADVIL,MOTRIN) 200 MG tablet Take 1,000 mg by mouth 2 (two) times daily.    [provider]  Melatonin 10 MG TABS Take 10 mg by mouth daily as needed (sleep).    [provider]  naproxen (NAPROSYN) 500 MG tablet Take 1 tablet (500 mg total) by mouth 2 (two) times daily. 03/01/17   Vanetta Mulders, MD  norethindrone (MICRONOR,CAMILA,ERRIN) 0.35 MG tablet Take 1 tablet by mouth daily.    [provider]  ondansetron (ZOFRAN ODT) 8 MG disintegrating tablet Take 1 tablet (8 mg total) by mouth every 8 (eight) hours as needed for nausea or vomiting. 12/29/14   Azalia Bilis, MD  oxyCODONE-acetaminophen (PERCOCET/ROXICET) 5-325 MG tablet Take 1-2 tablets by mouth every 4 (four) hours as needed for severe pain. 08/25/16   Shaune Pollack, MD    sulfamethoxazole-trimethoprim (BACTRIM DS,SEPTRA DS) 800-160 MG tablet Take 1 tablet by mouth 2 (two) times daily. 03/10/17   Devoria Albe, MD  traZODone (DESYREL) 150 MG tablet Take 150 mg by mouth at bedtime as needed for sleep.    [provider]  venlafaxine XR (EFFEXOR-XR) 150 MG 24 hr capsule Take 300 mg by mouth daily. 02/07/17 06/07/17  [provider]    Family History No family history on file.  Social History Social History  Substance Use Topics  . Smoking status: Never Smoker  . Smokeless tobacco: Never Used  . Alcohol use Yes     Comment: rare  states she works for [employer] Just moved here on August 27   Allergies   Coconut oil and Nitrofurantoin   Review of Systems Review of Systems  All other systems reviewed and are negative.    Physical Exam Updated Vital Signs BP (!) 158/103 (BP Location: Right Arm)   Pulse (!) 107   Temp 97.7 F (36.5 C) (Oral)   Resp (!) 24   Ht 5\' 1"  (1.549 m)   Wt 136.1 kg (300 lb)   LMP 02/21/2017   SpO2 93%   BMI 56.68 kg/m   Vital signs normal Except for tachycardia   Physical Exam  Constitutional: She is oriented to person, place, and time. She appears well-developed and well-nourished.  Obese, tearful  HENT:  Head: Normocephalic and atraumatic.  Right Ear: External ear normal.  Left Ear: External ear normal.  Nose: Nose normal.  Mouth/Throat: Oropharynx is clear and moist.  Eyes: Pupils are equal, round, and reactive to light. Conjunctivae and EOM are normal.  Neck: Normal range of motion. Neck supple.  Cardiovascular: Normal rate, regular rhythm and normal heart sounds.   Pulmonary/Chest: Effort normal and breath sounds normal. No respiratory distress. She has no wheezes. She has no rales. She exhibits no tenderness.  On breast exam her breasts are symmetrical. She can express a small amount of white drainage from her left nipple. When she removes the dressing from her right breast there are small dots  of blood on the dressing. She is noted to have her breast taped on the right which she states makes it feel better. When she removes the tape there is some mild redness on the breast laterally where the tape had been. The blood seems to be coming from the 2 holes where she had had her prior nipple rings. Otherwise there is no other redness of the skin or warmth felt. When I palpate her breast I do not feel any abnormality or masses. When I feel up towards her axilla I again I do not feel any masses.  Musculoskeletal: Normal range of motion.  Neurological: She is alert and oriented to person, place, and time.  No cranial nerve deficit.  Skin: Skin is warm and dry.  Psychiatric: Her behavior is normal. Thought content normal.  Nursing note and vitals reviewed.    ED Treatments / Results  Labs (all labs ordered are listed, but only abnormal results are displayed) Results for orders placed or performed during the hospital encounter of 03/10/17  Basic metabolic panel  Result Value Ref Range   Sodium 137 135 - 145 mmol/L   Potassium 3.9 3.5 - 5.1 mmol/L   Chloride 103 101 - 111 mmol/L   CO2 27 22 - 32 mmol/L   Glucose, Bld 127 (H) 65 - 99 mg/dL   BUN 12 6 - 20 mg/dL   Creatinine, Ser 1.61 0.44 - 1.00 mg/dL   Calcium 8.8 (L) 8.9 - 10.3 mg/dL   GFR calc non Af Amer >60 >60 mL/min   GFR calc Af Amer >60 >60 mL/min   Anion gap 7 5 - 15  CBC with Differential  Result Value Ref Range   WBC 7.6 4.0 - 10.5 K/uL   RBC 4.03 3.87 - 5.11 MIL/uL   Hemoglobin 11.7 (L) 12.0 - 15.0 g/dL   HCT 09.6 (L) 04.5 - 40.9 %   MCV 88.6 78.0 - 100.0 fL   MCH 29.0 26.0 - 34.0 pg   MCHC 32.8 30.0 - 36.0 g/dL   RDW 81.1 91.4 - 78.2 %   Platelets 317 150 - 400 K/uL   Neutrophils Relative % 46 %   Neutro Abs 3.5 1.7 - 7.7 K/uL   Lymphocytes Relative 45 %   Lymphs Abs 3.4 0.7 - 4.0 K/uL   Monocytes Relative 6 %   Monocytes Absolute 0.5 0.1 - 1.0 K/uL   Eosinophils Relative 3 %   Eosinophils Absolute 0.2 0.0 -  0.7 K/uL   Basophils Relative 0 %   Basophils Absolute 0.0 0.0 - 0.1 K/uL   Laboratory interpretation all normal except mild hypeglycemia    EKG  EKG Interpretation None       Radiology No results found.   BILATERAL MAMMOGRAM AND BILATERAL BREAST ULTRASOUND, 02/09/2017 11:22 AM  INDICATION: bilateral nipple discharge \ N64.52 Bilateral nipple discharge   COMPARISON: None available  ADDITIONAL CLINICAL HISTORY: Patient reports history of spontaneous bloody and milky nipple discharge for one month and palpable abnormality in the right breast and right axilla. Patient was treated for cellulitis of the right breast recently and was started on second round of antibiotics yesterday.  TECHNIQUE: CC and MLO views were performed of each breast with digital technique, 3D tomosynthesis, and computer-aided detection. Spot compression views of the right breast and targeted bilateral breast ultrasound were also performed.  FINDINGS:   Mammographically, there is no suspicious mass or calcifications. Asymmetry seen in the lateral right breast, middle third, disperses on spot compression view, consistent with overlapping fibroglandular tissue.  On physical exam, there is no palpable abnormality in the right breast or right axilla. Patient is extremely tender to palpation of the right breast and right axilla. No erythema in either breast. Skin changes related to prior bilateral nipple piercings are noted. Left bloody nipple discharge was expressed at prior piercing skin site. Left milky nipple discharge was also expressed.  On targeted ultrasound of the bilateral breasts, there is no sonographic abnormality. Targeted ultrasound of the right axilla demonstrates normal axillary content.  1.No mammographic suspicious abnormality. However, no prior studies are available for comparison. When comparison studies are received, an addendum will be placed. 2.No sonographic evidence of  malignancy.  Breast composition: B - Scattered fibroglandular density.  BI-RADS Category: 0 - Incomplete; needs additional imaging evaluation. Need prior mammograms for comparison. An additional report will be rendered when comparison is made or if prior studies cannot be obtained.  RECOMMENDATIONS: Z - Obtain Prior Study for Comparison. If bilateral bloody nipple discharge continues, breast MRI with and without contrast can be considered for further evaluation. Patient's risk for breast cancer should also be calculated given family history to determine if she is high risk (>20% lifetime risk) and qualifies for yearly screening MRIs.  [place] law now requires that mammography facilities inform their patients of their breast density and that higher density may increase their risk of cancer and may decrease sensitivity of mammography. Also, if heterogeneously dense or extremely dense, they should talk with their doctor about alternative screening strategies. The following website has been developed to assist patients and physicians: ncacr.org/breast-health.php. The patient lay summary letter includes their breast density, a link to the density website and for patients with dense breasts, a recommendation to talk with their doctor.  Procedures Procedures (including critical care time)  Medications Ordered in ED Medications  ketorolac (TORADOL) 30 MG/ML injection 30 mg (30 mg Intravenous Given 03/10/17 0257)     Initial Impression / Assessment and Plan / ED Course  I have reviewed the triage vital signs and the nursing notes.  Pertinent labs & imaging results that were available during my care of the patient were reviewed by me and considered in my medical decision making (see chart for details).     I have reviewed patient's testing that I am able to visualize from  that she had done at [facility]. I am unable to see the test results she had done on August 29. When I review  her office notes she called her PCP on September 20 and she was advised to go to the ED to get ultrasound done. I talked to our radiologist, Dr. Andria Meuse at 3:57 AM and he feels ultrasound would be better than a CT scan. Patient was scheduled for an outpatient ultrasound scan.I'm going to add Septra DS to her medical regimen.this will also cover MRSA.  Interestingly patient is tearful and complains of severe pain however on exam grossly and by palpation I do not see any gross abnormality other than she has some mild bloody drainage where she had her prior nipple rings. She was given IV Toradol for pain. I am not going to give her any more narcotic pain medication, she told me she has plenty of pills at home.  At time of discharge patient asking for tramadol or some other type of pain medication which was refused.  Review of the [place] shows patient gets narcotic prescriptions with low amounts however fairly frequently. Since January of this year she has gotten 8 narcotic prescriptions the most was 30 oxycodone 5 mg tablets twice and then there is also tramadol and oxycodone 5/325 for lesser amounts. From 03/20/2015 through 12/30/2017she had 6 narcotic prescriptions prescribed. These are prescribed from Drs. In [place], [place], and [place], [place], [place], and [employer]. She also used 6 pharmacies.  Final Clinical Impressions(s) / ED Diagnoses   Final diagnoses:  Nipple discharge, bloody  Breast pain, right    New Prescriptions New Prescriptions   SULFAMETHOXAZOLE-TRIMETHOPRIM (BACTRIM DS,SEPTRA DS) 800-160 MG TABLET    Take 1 tablet by mouth 2 (two) times daily.    Plan discharge  Devoria Albe, MD, Iline Oven,  Jodelle Gross, MD 03/10/17 [phone]

## 2017-03-10 NOTE — Discharge Instructions (Signed)
You can use ice or heat as needed for comfort. Take ibuprofen 600 mg 4 times a day OR aleve 2 tabs twice a day for pain. You can take that with acetaminophen 1000 mg 4 times a day. Take the septra DS antibiotic until gone. You can call in the morning to get the breast ultrasound done. You should follow up with your primary care doctor, or you can be evaluated by [facility] by the GYN or Dr Lovell Sheehan, a surgeon.

## 2017-03-10 NOTE — ED Triage Notes (Signed)
Pt states that she is having right breast pain with puss discharge this has been going on since august she has been on multiple antibotics for this.

## 2017-03-11 LAB — PROLACTIN: PROLACTIN: 29.9 ng/mL — AB (ref 4.8–23.3)

## 2017-04-30 ENCOUNTER — Emergency Department (HOSPITAL_COMMUNITY)
Admission: EM | Admit: 2017-04-30 | Discharge: 2017-04-30 | Disposition: A | Discharge status: Home / Self Care | Payer: Self-pay | Attending: Emergency Medicine | Admitting: Emergency Medicine

## 2017-04-30 ENCOUNTER — Encounter (HOSPITAL_COMMUNITY): Payer: Self-pay | Admitting: Emergency Medicine

## 2017-04-30 ENCOUNTER — Other Ambulatory Visit: Payer: Self-pay

## 2017-04-30 DIAGNOSIS — S40862A Insect bite (nonvenomous) of left upper arm, initial encounter: Secondary | ICD-10-CM | POA: Insufficient documentation

## 2017-04-30 DIAGNOSIS — Y998 Other external cause status: Secondary | ICD-10-CM | POA: Insufficient documentation

## 2017-04-30 DIAGNOSIS — Z79899 Other long term (current) drug therapy: Secondary | ICD-10-CM | POA: Insufficient documentation

## 2017-04-30 DIAGNOSIS — Y929 Unspecified place or not applicable: Secondary | ICD-10-CM | POA: Insufficient documentation

## 2017-04-30 DIAGNOSIS — Y939 Activity, unspecified: Secondary | ICD-10-CM | POA: Insufficient documentation

## 2017-04-30 DIAGNOSIS — W57XXXA Bitten or stung by nonvenomous insect and other nonvenomous arthropods, initial encounter: Secondary | ICD-10-CM | POA: Insufficient documentation

## 2017-04-30 MED ORDER — PREDNISONE 10 MG PO TABS: 40.0000 mg | ORAL_TABLET | Freq: Every day | ORAL | 0 refills | Status: AC

## 2017-04-30 MED ORDER — DIPHENHYDRAMINE HCL 25 MG PO CAPS
25.0000 mg | ORAL_CAPSULE | Freq: Once | ORAL | Status: AC
  Administered 2017-04-30: 25 mg via ORAL
  Filled 2017-04-30: qty 1

## 2017-04-30 MED ORDER — DEXAMETHASONE SODIUM PHOSPHATE 10 MG/ML IJ SOLN
10.0000 mg | Freq: Once | INTRAMUSCULAR | Status: AC
Start: 2017-04-30 — End: 2017-04-30
  Administered 2017-04-30: 10 mg via INTRAVENOUS
  Filled 2017-04-30: qty 1

## 2017-04-30 MED ORDER — RANITIDINE HCL 150 MG PO TABS: 150.0000 mg | ORAL_TABLET | Freq: Two times a day (BID) | ORAL | 0 refills | Status: DC

## 2017-04-30 MED ORDER — KETOROLAC TROMETHAMINE 30 MG/ML IJ SOLN
30.0000 mg | Freq: Once | INTRAMUSCULAR | Status: AC
  Administered 2017-04-30: 30 mg via INTRAVENOUS
  Filled 2017-04-30: qty 1

## 2017-04-30 NOTE — ED Notes (Signed)
Pt alert & oriented x4, stable gait. Patient given discharge instructions, paperwork & prescription(s). Patient  instructed to stop at the registration desk to finish any additional paperwork. Patient verbalized understanding. Pt left department w/ no further questions. 

## 2017-04-30 NOTE — ED Notes (Signed)
Gauze wrap applied to left upper arm covering red swollen area.

## 2017-04-30 NOTE — ED Notes (Signed)
Pt states she got bit about an hour ago. Redness, swelling & 2 small puncture wounds noted to the left ,upper arm. Ice pack applied, ring removed from finger.

## 2017-04-30 NOTE — ED Provider Notes (Signed)
[facility] EMERGENCY DEPARTMENT Provider Note   CSN: [id number] Arrival date & time: 04/30/17  2023     History   Chief Complaint Chief Complaint  Patient presents with  . Insect Bite    L upper arm 1 hr ago    HPI Sonya Gibson is a 31 y.o. female presenting with pain and swelling of the left arm.  Patient states that about an hour prior to arrival, she was putting on her jacket when she had acute pain of the medial left arm.  She removed the jacket, and noticed a red spot that was starting to swell.  In the past hour, area has become more swollen and tender.  She has associated swelling of the forearm and hand.  She reports mild tingling of the distal fingers of the left hand.  She has never had a reaction like this before.  She denies fevers, chills, chest pain, shortness of breath, nausea, or vomiting.  She took 50 mg of Benadryl immediately after the incident.  She is not immunocompromised.  No h/o diabetes.  She is not on any antibiotics currently.   HPI  Past Medical History:  Diagnosis Date  . Anxiety   . Depression   . GERD (gastroesophageal reflux disease)     Patient Active Problem List   Diagnosis Date Noted  . GESTATIONAL DIABETES 04/26/2010    Past Surgical History:  Procedure Laterality Date  . ANTERIOR CRUCIATE LIGAMENT REPAIR Left   . CESAREAN SECTION    . middle finger reatachment      OB History    No data available       Home Medications    Prior to Admission medications   Medication Sig Start Date End Date Taking? Authorizing Provider  cephALEXin (KEFLEX) 500 MG capsule Take 1 capsule (500 mg total) by mouth 4 (four) times daily. 03/01/17   Vanetta Mulders, MD  clonazePAM (KLONOPIN) 2 MG tablet Take 1 mg by mouth 2 (two) times daily.    [provider]  eszopiclone (LUNESTA) 2 MG TABS tablet Take 2 mg by mouth at bedtime as needed for sleep. Take immediately before bedtime    [provider]  HYDROcodone-acetaminophen  (NORCO/VICODIN) 5-325 MG tablet Take 1-2 tablets by mouth every 6 (six) hours as needed. 03/01/17   Vanetta Mulders, MD  ibuprofen (ADVIL,MOTRIN) 200 MG tablet Take 1,000 mg by mouth 2 (two) times daily.    [provider]  Melatonin 10 MG TABS Take 10 mg by mouth daily as needed (sleep).    [provider]  naproxen (NAPROSYN) 500 MG tablet Take 1 tablet (500 mg total) by mouth 2 (two) times daily. 03/01/17   Vanetta Mulders, MD  norethindrone (MICRONOR,CAMILA,ERRIN) 0.35 MG tablet Take 1 tablet by mouth daily.    [provider]  ondansetron (ZOFRAN ODT) 8 MG disintegrating tablet Take 1 tablet (8 mg total) by mouth every 8 (eight) hours as needed for nausea or vomiting. 12/29/14   Azalia Bilis, MD  oxyCODONE-acetaminophen (PERCOCET/ROXICET) 5-325 MG tablet Take 1-2 tablets by mouth every 4 (four) hours as needed for severe pain. 08/25/16   Shaune Pollack, MD  predniSONE (DELTASONE) 10 MG tablet Take 4 tablets (40 mg total) daily for 4 days by mouth. 04/30/17 05/04/17  Madyson Lukach, PA-C  ranitidine (ZANTAC) 150 MG tablet Take 1 tablet (150 mg total) 2 (two) times daily by mouth. 04/30/17   Arrayah Connors, PA-C  sulfamethoxazole-trimethoprim (BACTRIM DS,SEPTRA DS) 800-160 MG tablet Take 1 tablet  by mouth 2 (two) times daily. 03/10/17   Devoria AlbeKnapp, Iva, MD  traZODone (DESYREL) 150 MG tablet Take 150 mg by mouth at bedtime as needed for sleep.    [provider]  venlafaxine XR (EFFEXOR-XR) 150 MG 24 hr capsule Take 300 mg by mouth daily. 02/07/17 06/07/17  [provider]    Family History No family history on file.  Social History Social History   Tobacco Use  . Smoking status: Never Smoker  . Smokeless tobacco: Never Used  Substance Use Topics  . Alcohol use: Yes    Comment: rare  . Drug use: No     Allergies   Coconut oil and Nitrofurantoin   Review of Systems Review of Systems  Skin: Positive for color change.    Allergic/Immunologic: Negative for immunocompromised state.  Neurological: Negative for numbness.  Hematological: Does not bruise/bleed easily.     Physical Exam Updated Vital Signs BP (!) 151/103 (BP Location: Right Arm)   Pulse 97   Temp 98.2 F (36.8 C) (Oral)   Resp 20   Ht 5\' 2"  (1.575 m)   Wt 133.8 kg (295 lb)   LMP 04/17/2017   SpO2 99%   BMI 53.96 kg/m   Physical Exam  Constitutional: She is oriented to person, place, and time. She appears well-developed and well-nourished. No distress.  HENT:  Head: Normocephalic and atraumatic.  Eyes: EOM are normal.  Neck: Normal range of motion.  Cardiovascular: Normal rate, regular rhythm and intact distal pulses.  Pulmonary/Chest: Effort normal and breath sounds normal. No stridor. No respiratory distress. She has no wheezes. She has no rales. She exhibits no tenderness.  She is speaking in full sentences without difficulty.  No respiratory distress.  Lung sounds clear in all fields.  Abdominal: Soft. She exhibits no distension. There is no tenderness.  Musculoskeletal: Normal range of motion. She exhibits edema.  Minimal swelling of left forearm and hand.  Radial pulses equal bilaterally.  Sensation intact bilaterally.  Full active range of motion without difficulty.  Color and warmth equal bilaterally.  Neurological: She is alert and oriented to person, place, and time.  Skin: Skin is warm. Rash noted.  Tender area of erythema and warmth on medial left arm with 2 sets of puncture marks, ~ 3 cm diameter.  Second smaller lesion also with set of puncture mark, ~1 cm diameter.  No streaking redness.  No pallor around the bite marks, bull's-eye, or signs of necrotic tissue.  Psychiatric: She has a normal mood and affect.  Nursing note and vitals reviewed.   ED Treatments / Results  Labs (all labs ordered are listed, but only abnormal results are displayed) Labs Reviewed - No data to display  EKG  EKG Interpretation None        Radiology No results found.  Procedures Procedures (including critical care time)  Medications Ordered in ED Medications  dexamethasone (DECADRON) injection 10 mg (10 mg Intravenous Given 04/30/17 2123)  ketorolac (TORADOL) 30 MG/ML injection 30 mg (30 mg Intravenous Given 04/30/17 2124)  diphenhydrAMINE (BENADRYL) capsule 25 mg (25 mg Oral Given 04/30/17 2200)     Initial Impression / Assessment and Plan / ED Course  I have reviewed the triage vital signs and the nursing notes.  Pertinent labs & imaging results that were available during my care of the patient were reviewed by me and considered in my medical decision making (see chart for details).     Patient with insect bite with localized swelling, erythema,  and pain.  Physical exam shows 2 areas of redness warmth and swelling.  No signs of necrotic tissue or streaking.  Patient is neurovascularly intact.  Patient is afebrile.  Will give Decadron and ketorolac and reassess in 30 min.  On reassessment, patient states swelling has not progressed, pain is improved.  Lesions now itch significantly.  No further symptoms including fevers, chest pain, shortness of breath, or nausea. Benadryl PO given. Will reassess in 30 min.  On reassessment, patient reports no further progression of symptoms.  Benadryl mildly helped with itching.  Discussed symptomatic care over the next several days, including keeping the area covered so she cannot scratch it, H1 and H2 blockers for itching and swelling, and Decadron for inflammation.  Patient to follow-up with PCP in 3 days for reevaluation.  At this time, patient appears safe for discharge.  Strict return precautions given.  Patient states she understands and agrees to plan.   Final Clinical Impressions(s) / ED Diagnoses   Final diagnoses:  Insect bite, initial encounter    ED Discharge Orders        Ordered    predniSONE (DELTASONE) 10 MG tablet  Daily     04/30/17 2252    ranitidine  (ZANTAC) 150 MG tablet  2 times daily     04/30/17 2252       Adryan Shin, PA-C 05/01/17 0146    Samuel JesterMcManus, Kathleen, DO 05/02/17 1013

## 2017-04-30 NOTE — Discharge Instructions (Signed)
Take prednisone as prescribed. Continue to take Benadryl.  Take Zantac daily until symptoms are resolved.  Continue to apply ice to the affected area.  Keep the area covered with a dressing or ace wrap to prevent itching.  Keep your rings off until all swelling is resolved.  Follow up with your primary care doctor for re-evaluation.  Return to the ER if you develop fevers, chills, blackness of the skin, red streaking up/down your arm, numbness, or any new or worsening symptoms.

## 2017-04-30 NOTE — ED Triage Notes (Signed)
Putting on coat something bit her L upper arm  Pt put ice on it as well as took benadryl without relief now red and swelling

## 2017-08-14 ENCOUNTER — Encounter (HOSPITAL_COMMUNITY): Payer: Self-pay

## 2017-08-14 ENCOUNTER — Other Ambulatory Visit: Payer: Self-pay

## 2017-08-14 ENCOUNTER — Emergency Department (HOSPITAL_COMMUNITY)
Admission: EM | Admit: 2017-08-14 | Discharge: 2017-08-15 | Disposition: A | Discharge status: Home / Self Care | Payer: Self-pay | Attending: Emergency Medicine | Admitting: Emergency Medicine

## 2017-08-14 ENCOUNTER — Emergency Department (HOSPITAL_COMMUNITY): Payer: Self-pay

## 2017-08-14 DIAGNOSIS — Z79899 Other long term (current) drug therapy: Secondary | ICD-10-CM | POA: Insufficient documentation

## 2017-08-14 DIAGNOSIS — Y999 Unspecified external cause status: Secondary | ICD-10-CM | POA: Insufficient documentation

## 2017-08-14 DIAGNOSIS — Y92008 Other place in unspecified non-institutional (private) residence as the place of occurrence of the external cause: Secondary | ICD-10-CM | POA: Insufficient documentation

## 2017-08-14 DIAGNOSIS — W19XXXA Unspecified fall, initial encounter: Secondary | ICD-10-CM

## 2017-08-14 DIAGNOSIS — W010XXA Fall on same level from slipping, tripping and stumbling without subsequent striking against object, initial encounter: Secondary | ICD-10-CM | POA: Insufficient documentation

## 2017-08-14 DIAGNOSIS — S7002XA Contusion of left hip, initial encounter: Secondary | ICD-10-CM | POA: Insufficient documentation

## 2017-08-14 DIAGNOSIS — Y939 Activity, unspecified: Secondary | ICD-10-CM | POA: Insufficient documentation

## 2017-08-14 LAB — I-STAT BETA HCG BLOOD, ED (MC, WL, AP ONLY)

## 2017-08-14 MED ORDER — MORPHINE SULFATE (PF) 4 MG/ML IV SOLN
4.0000 mg | Freq: Once | INTRAVENOUS | Status: AC
  Administered 2017-08-14: 4 mg via INTRAVENOUS
  Filled 2017-08-14: qty 1

## 2017-08-14 MED ORDER — ONDANSETRON HCL 4 MG/2ML IJ SOLN
4.0000 mg | Freq: Once | INTRAMUSCULAR | Status: AC
  Administered 2017-08-14: 4 mg via INTRAVENOUS
  Filled 2017-08-14: qty 2

## 2017-08-14 MED ORDER — DIPHENHYDRAMINE HCL 50 MG/ML IJ SOLN
25.0000 mg | Freq: Once | INTRAMUSCULAR | Status: AC
  Administered 2017-08-14: 25 mg via INTRAVENOUS
  Filled 2017-08-14: qty 1

## 2017-08-14 NOTE — ED Notes (Signed)
Pt stated she took 4 pregnancy tests today at home and they were negative

## 2017-08-14 NOTE — ED Provider Notes (Signed)
[facility] EMERGENCY DEPARTMENT Provider Note   CSN: [id number] Arrival date & time: 08/14/17  1947     History   Chief Complaint Chief Complaint  Patient presents with  . Fall    HPI Sonya Gibson is a 32 y.o. female.  Patient is a 32 year old female with past medical history of anxiety and depression and obesity.  She presents today for evaluation of left hip pain.  She reports slipping on wet stairs coming out of her house.  She landed on her left side.  She has been having severe pain in her left hip since this time.  Denies any numbness or tingling.  She denies any other injury.   The history is provided by the patient.  Fall  This is a new problem. The current episode started 1 to 2 hours ago. The problem occurs constantly. The problem has not changed since onset.The symptoms are aggravated by walking. Nothing relieves the symptoms. She has tried nothing for the symptoms.    Past Medical History:  Diagnosis Date  . Anxiety   . Depression   . GERD (gastroesophageal reflux disease)     Patient Active Problem List   Diagnosis Date Noted  . GESTATIONAL DIABETES 04/26/2010    Past Surgical History:  Procedure Laterality Date  . ANTERIOR CRUCIATE LIGAMENT REPAIR Left   . CESAREAN SECTION    . middle finger reatachment      OB History    No data available       Home Medications    Prior to Admission medications   Medication Sig Start Date End Date Taking? Authorizing Provider  acyclovir (ZOVIRAX) 800 MG tablet Take 800 mg by mouth 2 (two) times daily.  08/04/17 09/01/17 Yes [provider]  clonazePAM (KLONOPIN) 2 MG tablet Take 2 mg by mouth 2 (two) times daily.    Yes [provider]  cyclobenzaprine (FLEXERIL) 10 MG tablet Take 10 mg by mouth 3 (three) times daily as needed for muscle spasms.    Yes [provider]  gabapentin (NEURONTIN) 100 MG capsule Take 100-300 mg by mouth daily as needed (for pain associated with shingles).    Yes [provider]  HYDROcodone-acetaminophen (NORCO/VICODIN) 5-325 MG tablet Take 1-2 tablets by mouth every 6 (six) hours as needed. 03/01/17  Yes Vanetta MuldersZackowski, Scott, MD  ibuprofen (ADVIL,MOTRIN) 200 MG tablet Take 1,000 mg by mouth 2 (two) times daily.   Yes [provider]  levothyroxine (SYNTHROID, LEVOTHROID) 50 MCG tablet Take 50 mcg by mouth daily before breakfast.  06/06/17  Yes [provider]  Melatonin 10 MG TABS Take 10 mg by mouth daily as needed (sleep).   Yes [provider]  ondansetron (ZOFRAN ODT) 8 MG disintegrating tablet Take 1 tablet (8 mg total) by mouth every 8 (eight) hours as needed for nausea or vomiting. 12/29/14  Yes Azalia Bilisampos, Kevin, MD  traZODone (DESYREL) 150 MG tablet Take 150 mg by mouth at bedtime as needed for sleep.   Yes [provider]  venlafaxine (EFFEXOR) 75 MG tablet Take 75 mg by mouth at bedtime. 150mg  and 75mg  nightly   Yes [provider]  venlafaxine XR (EFFEXOR-XR) 150 MG 24 hr capsule Take 150 mg by mouth at bedtime. 150mg  and 75mg  nightly 06/06/17 10/04/17 Yes [provider]    Family History No family history on file.  Social History Social History   Tobacco Use  . Smoking status: Never Smoker  . Smokeless tobacco: Never Used  Substance  Use Topics  . Alcohol use: Yes    Comment: rare  . Drug use: No     Allergies   Coconut oil and Nitrofurantoin   Review of Systems Review of Systems  All other systems reviewed and are negative.    Physical Exam Updated Vital Signs BP (!) 108/55   Pulse 98   Temp 98.9 F (37.2 C) (Oral)   Resp 18   Ht 5\' 1"  (1.549 m)   Wt 131.5 kg (290 lb)   SpO2 99%   BMI 54.80 kg/m   Physical Exam  Constitutional: She is oriented to person, place, and time. She appears well-developed and well-nourished. No distress.  Patient is a 32 year old female who appears uncomfortable.  She is tearful.  HENT:  Head: Normocephalic and atraumatic.    Neck: Normal range of motion. Neck supple.  Cardiovascular: Normal rate and regular rhythm. Exam reveals no gallop and no friction rub.  No murmur heard. Pulmonary/Chest: Effort normal and breath sounds normal. No respiratory distress. She has no wheezes.  Abdominal: Soft. Bowel sounds are normal. She exhibits no distension. There is no tenderness.  Musculoskeletal: Normal range of motion.  There is tenderness to palpation over the left lateral hip.  She has pain with any range of motion.  Sensation and motor is intact to the left lower leg and foot.  DP pulses are easily palpable.  Neurological: She is alert and oriented to person, place, and time.  Skin: Skin is warm and dry. She is not diaphoretic.  Nursing note and vitals reviewed.    ED Treatments / Results  Labs (all labs ordered are listed, but only abnormal results are displayed) Labs Reviewed  BASIC METABOLIC PANEL  CBC WITH DIFFERENTIAL/PLATELET  I-STAT BETA HCG BLOOD, ED ([facility], [facility], [facility] ONLY)    EKG  EKG Interpretation None       Radiology Dg Hip Unilat With Pelvis Min 4 Views Left  Result Date: 08/14/2017 CLINICAL DATA:  Fall, left hip pain EXAM: DG HIP (WITH OR WITHOUT PELVIS) 4+V LEFT COMPARISON:  None. FINDINGS: Subtle cortical irregularity noted laterally in the left femoral neck. No subluxation or dislocation. SI joints are symmetric and unremarkable. IMPRESSION: Subtle lucency and cortical irregularity laterally in the left femoral neck region. Difficult to exclude subtle nondisplaced left femoral neck fracture. If there is high clinical suspicion, CT would be beneficial for further evaluation. Electronically Signed   By: Charlett Nose M.D.   On: 08/14/2017 20:42    Procedures Procedures (including critical care time)  Medications Ordered in ED Medications  morphine 4 MG/ML injection 4 mg (not administered)  ondansetron (ZOFRAN) injection 4 mg (not administered)  diphenhydrAMINE (BENADRYL) injection 25 mg (not  administered)     Initial Impression / Assessment and Plan / ED Course  I have reviewed the triage vital signs and the nursing notes.  Pertinent labs & imaging results that were available during my care of the patient were reviewed by me and considered in my medical decision making (see chart for details).  Patient presents with left hip pain after slipping and falling outside.  Her initial x-ray showed a possible cortical irregularity at the femoral neck.  This was followed up with a CT scan which was negative for fracture.  She is feeling better after medications in the ER.  She will be discharged with anti-inflammatories, hydrocodone, and follow-up with her primary doctor.  Final Clinical Impressions(s) / ED Diagnoses   Final diagnoses:  Fall    ED  Discharge Orders    None       Geoffery Lyons, MD 08/15/17 [phone]

## 2017-08-14 NOTE — ED Triage Notes (Signed)
Pt fell out of her house today and hit her left hip. Pt is able to walk, but states it makes her "throw up." No shortening or rotating noted. Pt tearful in triage.

## 2017-08-14 NOTE — ED Notes (Signed)
Patient transported to CT 

## 2017-08-15 LAB — CBC WITH DIFFERENTIAL/PLATELET
Basophils Absolute: 0 10*3/uL (ref 0.0–0.1)
Basophils Relative: 0 %
EOS ABS: 0.2 10*3/uL (ref 0.0–0.7)
EOS PCT: 3 %
HCT: 39.1 % (ref 36.0–46.0)
Hemoglobin: 12.4 g/dL (ref 12.0–15.0)
LYMPHS ABS: 2.9 10*3/uL (ref 0.7–4.0)
Lymphocytes Relative: 35 %
MCH: 28.6 pg (ref 26.0–34.0)
MCHC: 31.7 g/dL (ref 30.0–36.0)
MCV: 90.1 fL (ref 78.0–100.0)
MONO ABS: 0.4 10*3/uL (ref 0.1–1.0)
Monocytes Relative: 4 %
Neutro Abs: 4.7 10*3/uL (ref 1.7–7.7)
Neutrophils Relative %: 58 %
PLATELETS: 333 10*3/uL (ref 150–400)
RBC: 4.34 MIL/uL (ref 3.87–5.11)
RDW: 14.7 % (ref 11.5–15.5)
WBC: 8.1 10*3/uL (ref 4.0–10.5)

## 2017-08-15 LAB — BASIC METABOLIC PANEL
Anion gap: 11 (ref 5–15)
BUN: 9 mg/dL (ref 6–20)
CHLORIDE: 102 mmol/L (ref 101–111)
CO2: 26 mmol/L (ref 22–32)
CREATININE: 0.77 mg/dL (ref 0.44–1.00)
Calcium: 9 mg/dL (ref 8.9–10.3)
GFR calc Af Amer: >60 mL/min (ref >60)
Glucose, Bld: 106 mg/dL — ABNORMAL HIGH (ref 65–99)
Potassium: 3.8 mmol/L (ref 3.5–5.1)
Sodium: 139 mmol/L (ref 135–145)

## 2017-08-15 MED ORDER — HYDROCODONE-ACETAMINOPHEN 5-325 MG PO TABS: 1.0000 | ORAL_TABLET | Freq: Four times a day (QID) | ORAL | 0 refills | Status: DC | PRN

## 2017-08-15 NOTE — Discharge Instructions (Signed)
Ibuprofen 600 mg every 6 hours as needed for pain.  Hydrocodone is prescribed as needed for pain not relieved with ibuprofen.  Follow-up with primary doctor if not improving in the next week.

## 2017-11-14 ENCOUNTER — Inpatient Hospital Stay (HOSPITAL_COMMUNITY)
Admission: EM | Admit: 2017-11-14 | Discharge: 2017-11-21 | DRG: 552 | Disposition: A | Discharge status: Home Health | Payer: Self-pay | Attending: Internal Medicine | Admitting: Internal Medicine

## 2017-11-14 ENCOUNTER — Other Ambulatory Visit: Payer: Self-pay

## 2017-11-14 ENCOUNTER — Emergency Department (HOSPITAL_COMMUNITY): Payer: Self-pay

## 2017-11-14 ENCOUNTER — Encounter (HOSPITAL_COMMUNITY): Payer: Self-pay | Admitting: *Deleted

## 2017-11-14 DIAGNOSIS — E039 Hypothyroidism, unspecified: Secondary | ICD-10-CM | POA: Diagnosis present

## 2017-11-14 DIAGNOSIS — R269 Unspecified abnormalities of gait and mobility: Secondary | ICD-10-CM

## 2017-11-14 DIAGNOSIS — F419 Anxiety disorder, unspecified: Secondary | ICD-10-CM | POA: Diagnosis present

## 2017-11-14 DIAGNOSIS — G549 Nerve root and plexus disorder, unspecified: Secondary | ICD-10-CM

## 2017-11-14 DIAGNOSIS — I10 Essential (primary) hypertension: Secondary | ICD-10-CM | POA: Diagnosis present

## 2017-11-14 DIAGNOSIS — M5116 Intervertebral disc disorders with radiculopathy, lumbar region: Principal | ICD-10-CM | POA: Diagnosis present

## 2017-11-14 DIAGNOSIS — Z6841 Body Mass Index (BMI) 40.0 and over, adult: Secondary | ICD-10-CM

## 2017-11-14 DIAGNOSIS — R29898 Other symptoms and signs involving the musculoskeletal system: Secondary | ICD-10-CM | POA: Diagnosis present

## 2017-11-14 DIAGNOSIS — M5441 Lumbago with sciatica, right side: Secondary | ICD-10-CM

## 2017-11-14 DIAGNOSIS — K219 Gastro-esophageal reflux disease without esophagitis: Secondary | ICD-10-CM | POA: Diagnosis present

## 2017-11-14 DIAGNOSIS — K5903 Drug induced constipation: Secondary | ICD-10-CM | POA: Diagnosis not present

## 2017-11-14 DIAGNOSIS — M545 Low back pain, unspecified: Secondary | ICD-10-CM | POA: Diagnosis present

## 2017-11-14 DIAGNOSIS — Z79890 Hormone replacement therapy (postmenopausal): Secondary | ICD-10-CM

## 2017-11-14 DIAGNOSIS — M5410 Radiculopathy, site unspecified: Secondary | ICD-10-CM

## 2017-11-14 DIAGNOSIS — M48061 Spinal stenosis, lumbar region without neurogenic claudication: Secondary | ICD-10-CM | POA: Diagnosis present

## 2017-11-14 DIAGNOSIS — F4310 Post-traumatic stress disorder, unspecified: Secondary | ICD-10-CM | POA: Diagnosis present

## 2017-11-14 DIAGNOSIS — Z8249 Family history of ischemic heart disease and other diseases of the circulatory system: Secondary | ICD-10-CM

## 2017-11-14 DIAGNOSIS — T40605A Adverse effect of unspecified narcotics, initial encounter: Secondary | ICD-10-CM | POA: Diagnosis not present

## 2017-11-14 DIAGNOSIS — Z79899 Other long term (current) drug therapy: Secondary | ICD-10-CM

## 2017-11-14 DIAGNOSIS — R32 Unspecified urinary incontinence: Secondary | ICD-10-CM | POA: Diagnosis present

## 2017-11-14 DIAGNOSIS — R208 Other disturbances of skin sensation: Secondary | ICD-10-CM

## 2017-11-14 HISTORY — DX: Post-traumatic stress disorder, unspecified: F43.10

## 2017-11-14 HISTORY — DX: Essential (primary) hypertension: I10

## 2017-11-14 HISTORY — DX: Hypothyroidism, unspecified: E03.9

## 2017-11-14 LAB — BASIC METABOLIC PANEL
Anion gap: 10 (ref 5–15)
BUN: 12 mg/dL (ref 6–20)
CO2: 29 mmol/L (ref 22–32)
Calcium: 9.3 mg/dL (ref 8.9–10.3)
Chloride: 100 mmol/L — ABNORMAL LOW (ref 101–111)
Creatinine, Ser: 0.79 mg/dL (ref 0.44–1.00)
GFR calc Af Amer: >60 mL/min (ref >60)
GLUCOSE: 92 mg/dL (ref 65–99)
POTASSIUM: 4 mmol/L (ref 3.5–5.1)
Sodium: 139 mmol/L (ref 135–145)

## 2017-11-14 LAB — CBC WITH DIFFERENTIAL/PLATELET
Basophils Absolute: 0 10*3/uL (ref 0.0–0.1)
Basophils Relative: 0 %
EOS PCT: 2 %
Eosinophils Absolute: 0.2 10*3/uL (ref 0.0–0.7)
HCT: 38 % (ref 36.0–46.0)
Hemoglobin: 12.1 g/dL (ref 12.0–15.0)
LYMPHS ABS: 3.1 10*3/uL (ref 0.7–4.0)
LYMPHS PCT: 37 %
MCH: 28.9 pg (ref 26.0–34.0)
MCHC: 31.8 g/dL (ref 30.0–36.0)
MCV: 90.7 fL (ref 78.0–100.0)
MONO ABS: 0.5 10*3/uL (ref 0.1–1.0)
Monocytes Relative: 6 %
Neutro Abs: 4.5 10*3/uL (ref 1.7–7.7)
Neutrophils Relative %: 55 %
PLATELETS: 338 10*3/uL (ref 150–400)
RBC: 4.19 MIL/uL (ref 3.87–5.11)
RDW: 15.8 % — ABNORMAL HIGH (ref 11.5–15.5)
WBC: 8.3 10*3/uL (ref 4.0–10.5)

## 2017-11-14 LAB — URINALYSIS, ROUTINE W REFLEX MICROSCOPIC
Bilirubin Urine: NEGATIVE
Glucose, UA: NEGATIVE mg/dL
HGB URINE DIPSTICK: NEGATIVE
Ketones, ur: NEGATIVE mg/dL
Leukocytes, UA: NEGATIVE
Nitrite: NEGATIVE
PH: 5 (ref 5.0–8.0)
Protein, ur: NEGATIVE mg/dL
SPECIFIC GRAVITY, URINE: 1.028 (ref 1.005–1.030)

## 2017-11-14 MED ORDER — PROMETHAZINE HCL 25 MG/ML IJ SOLN
12.5000 mg | Freq: Once | INTRAMUSCULAR | Status: AC
  Administered 2017-11-14: 12.5 mg via INTRAVENOUS
  Filled 2017-11-14: qty 1

## 2017-11-14 MED ORDER — HYDROMORPHONE HCL 1 MG/ML IJ SOLN
0.5000 mg | Freq: Once | INTRAMUSCULAR | Status: AC
  Administered 2017-11-14: 0.5 mg via INTRAVENOUS
  Filled 2017-11-14: qty 1

## 2017-11-14 MED ORDER — HYDROMORPHONE HCL 2 MG/ML IJ SOLN
1.0000 mg | Freq: Once | INTRAMUSCULAR | Status: AC
Start: 2017-11-15 — End: 2017-11-15
  Administered 2017-11-15: 1 mg via INTRAVENOUS
  Filled 2017-11-14: qty 1

## 2017-11-14 NOTE — ED Notes (Signed)
Pt arrived via [facility] from [facility]. Pt is in need for MRI for possible Cauda Equina.

## 2017-11-14 NOTE — ED Provider Notes (Signed)
[facility] EMERGENCY DEPARTMENT Provider Note   CSN: [id number] Arrival date & time: 11/14/17  1946     History   Chief Complaint Chief Complaint  Patient presents with  . Back Pain    HPI Sonya Gibson is a 32 y.o. female with a hx of anxiety, depression, GERD, hypothyroidism presents to the Emergency Department complaining of acute, persistent, perched progressively worsening right lower back pain that occurred while she was attempting to get into her husband's truck 2 days ago.  She reports pain radiates into her right buttock and into the right thigh.  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  Patient reports that she is having difficulty using the bathroom because she is unable to get up off the toilet.  Patient also reports numbness of the right leg and 2 episodes of urinary incontinence this afternoon.  Positioning and movement make her symptoms worse.  Nothing seems to make them better.  Patient was initially evaluated at any pain emergency department and transferred here to [facility] for MRI to rule out cauda equina.   The history is provided by the patient, the spouse and medical records. No language interpreter was used.    Past Medical History:  Diagnosis Date  . Anxiety   . Depression   . GERD (gastroesophageal reflux disease)   . Hypothyroidism     Patient Active Problem List   Diagnosis Date Noted  . Lower back pain 11/15/2017  . Hypothyroidism   . Anxiety   . GESTATIONAL DIABETES 04/26/2010    Past Surgical History:  Procedure Laterality Date  . ANTERIOR CRUCIATE LIGAMENT REPAIR Left   . CESAREAN SECTION    . middle finger reatachment       OB History   None      Home Medications    Prior to Admission medications   Medication Sig Start Date End Date Taking? Authorizing Provider  clonazePAM (KLONOPIN) 2 MG tablet Take 2 mg by mouth 2  (two) times daily.     [provider]  cyclobenzaprine (FLEXERIL) 10 MG tablet Take 10 mg by mouth 3 (three) times daily as needed for muscle spasms.     [provider]  gabapentin (NEURONTIN) 100 MG capsule Take 100-300 mg by mouth daily as needed (for pain associated with shingles).    [provider]  HYDROcodone-acetaminophen (NORCO) 5-325 MG tablet Take 1-2 tablets by mouth every 6 (six) hours as needed. 08/15/17   Geoffery Lyons, MD  ibuprofen (ADVIL,MOTRIN) 200 MG tablet Take 1,000 mg by mouth 2 (two) times daily.    [provider]  levothyroxine (SYNTHROID, LEVOTHROID) 50 MCG tablet Take 50 mcg by mouth daily before breakfast.  06/06/17   [provider]  Melatonin 10 MG TABS Take 10 mg by mouth daily as needed (sleep).    [provider]  ondansetron (ZOFRAN ODT) 8 MG disintegrating tablet Take 1 tablet (8 mg total) by mouth every 8 (eight) hours as needed for nausea or vomiting. 12/29/14   Azalia Bilis, MD  traZODone (DESYREL) 150 MG tablet Take 150 mg by mouth at bedtime as needed for sleep.    [provider]  venlafaxine (EFFEXOR) 75 MG tablet Take 75 mg by mouth at bedtime.  and  nightly    [provider]  venlafaxine XR (EFFEXOR-XR) 150 MG 24 hr capsule Take 150 mg by mouth  at bedtime.  and  nightly 06/06/17 10/04/17  [provider]    Family History History reviewed. No pertinent family history.  Social History Social History   Tobacco Use  . Smoking status: Never Smoker  . Smokeless tobacco: Never Used  Substance Use Topics  . Alcohol use: Yes    Comment: rare  . Drug use: No     Allergies   Coconut oil and Nitrofurantoin   Review of Systems Review of Systems  Constitutional: Negative for appetite change, diaphoresis, fatigue, fever and unexpected weight change.  HENT: Negative for mouth sores.   Eyes: Negative for visual disturbance.  Respiratory: Negative for  cough, chest tightness, shortness of breath and wheezing.   Cardiovascular: Negative for chest pain.  Gastrointestinal: Negative for abdominal pain, constipation, diarrhea, nausea and vomiting.  Endocrine: Negative for polydipsia, polyphagia and polyuria.  Genitourinary: Positive for urgency. Negative for dysuria, frequency and hematuria.       Urinary incontinence  Musculoskeletal: Positive for back pain. Negative for neck stiffness.  Skin: Negative for rash.  Allergic/Immunologic: Negative for immunocompromised state.  Neurological: Positive for weakness and numbness. Negative for syncope, light-headedness and headaches.  Hematological: Does not bruise/bleed easily.  Psychiatric/Behavioral: Negative for sleep disturbance. The patient is not nervous/anxious.      Physical Exam Updated Vital Signs BP 129/84 (BP Location: Right Arm)   Pulse 95   Temp 98.2 F (36.8 C) (Oral)   Resp 20   Ht  (1.575 m)   Wt (!) 140.6 kg (310 lb)   LMP 11/01/2017   SpO2 98%   BMI 56.70 kg/m   Physical Exam  Constitutional: She appears well-developed and well-nourished. She appears distressed.  Uncomfortable appearing while lying in bed  HENT:  Head: Normocephalic and atraumatic.  Mouth/Throat: Oropharynx is clear and moist. No oropharyngeal exudate.  Eyes: Conjunctivae are normal. No scleral icterus.  Neck: Normal range of motion. Neck supple.  Full ROM without pain  Cardiovascular: Normal rate, regular rhythm and intact distal pulses.  Pulmonary/Chest: Effort normal and breath sounds normal. No respiratory distress. She has no wheezes.  Equal chest expansion  Abdominal: Soft. Bowel sounds are normal. She exhibits no distension and no mass. There is no tenderness. There is no rebound and no guarding.  Musculoskeletal: She exhibits no edema.       Right hip: She exhibits decreased range of motion.  Decreased range of motion of the T-spine and L-spine due to significant pain. No midline  tenderness to the  T-spine or L-spine Significant tenderness to palpation over the right paraspinal muscles of the L-spine and right SI joint.  Palpation of the site reproduces radiation of the pain down the entire right leg. Decreased range of motion of the right hip due to severe pain in her back.  Lymphadenopathy:    She has no cervical adenopathy.  Neurological: She is alert.  Speech is clear and goal oriented, follows commands Normal 5/5 strength in upper extremities bilaterally including strong and equal grip strength Normal 5/5 strength in the left lower extremity including dorsiflexion and plantarflexion along with flexion and extension of the knee and hip. Decreased 4/5 strength in the right lower extremity including dorsiflexion and plantarflexion along with flexion and extension of the knee and hip. Sensation normal to light touch in the left lower extremity but decreased in the right lower extremity Moves extremities without ataxia, coordination intact Severely antalgic gait requiring assistance.  She is only able to ambulate several steps before her  right leg begins to give out due to severe pain.  No foot drop. Normal balance No Clonus  Skin: Skin is warm and dry. No rash noted. She is not diaphoretic. No erythema.  Psychiatric: She has a normal mood and affect. Her behavior is normal.  Nursing note and vitals reviewed.    ED Treatments / Results  Labs (all labs ordered are listed, but only abnormal results are displayed) Labs Reviewed  URINALYSIS, ROUTINE W REFLEX MICROSCOPIC - Abnormal; Notable for the following components:      Result Value   APPearance HAZY (*)    All other components within normal limits  CBC WITH DIFFERENTIAL/PLATELET - Abnormal; Notable for the following components:   RDW 15.8 (*)    All other components within normal limits  BASIC METABOLIC PANEL - Abnormal; Notable for the following components:   Chloride 100 (*)    All other components within  normal limits    Radiology Mr Lumbar Spine Wo Contrast  Result Date: 11/15/2017 CLINICAL DATA:  32 y/o F; sudden onset lower right-sided back pain radiating into the right buttocks and right thigh. EXAM: MRI LUMBAR SPINE WITHOUT CONTRAST TECHNIQUE: Multiplanar, multisequence MR imaging of the lumbar spine was performed. No intravenous contrast was administered. COMPARISON:  05/01/2016 CT abdomen and pelvis. FINDINGS: Segmentation:  Standard. Alignment:  Physiologic. Vertebrae:  No fracture, evidence of discitis, or bone lesion. Conus medullaris and cauda equina: Conus extends to the L1 level. Fatty filum. Paraspinal and other soft tissues: Negative. Disc levels: L1-2: No significant disc displacement, foraminal stenosis, or canal stenosis. L2-3: No significant disc displacement, foraminal stenosis, or canal stenosis. L3-4: Small left foraminal disc protrusion with mild left foraminal stenosis. No canal stenosis. L4-5: Small right subarticular and foraminal disc protrusion which contacts the descending L5 nerve root in the right lateral recess (series 8, image 20) and results in mild right-sided foraminal stenosis. No canal stenosis. L5-S1: Small central disc protrusion. No significant foraminal or canal stenosis. IMPRESSION: 1. No acute osseous abnormality or malalignment. 2. Small disc protrusions are present at the L3-4, L4-5, and L5-S1 levels. 3. Mild left L3-4 and mild right L4-5 foraminal stenosis. No significant canal stenosis. 4. Protrusion contact on the descending right L5 nerve root in the right L4-5 lateral recess. Electronically Signed   By: Mitzi Hansen M.D.   On: 11/15/2017 01:02    Procedures Procedures (including critical care time)  Medications Ordered in ED Medications  HYDROmorphone (DILAUDID) injection 0.5 mg (0.5 mg Intravenous Given 11/14/17 2132)  promethazine (PHENERGAN) injection 12.5 mg (12.5 mg Intravenous Given 11/14/17 2132)  HYDROmorphone (DILAUDID) injection 1  mg (1 mg Intravenous Given 11/15/17 0114)  predniSONE (DELTASONE) tablet 60 mg (60 mg Oral Given 11/15/17 0215)  fentaNYL (SUBLIMAZE) injection 50 mcg (50 mcg Intravenous Given 11/15/17 0217)  ketorolac (TORADOL) 30 MG/ML injection 30 mg (30 mg Intravenous Given 11/15/17 0216)  methocarbamol (ROBAXIN) tablet 1,000 mg (1,000 mg Oral Given 11/15/17 0215)     Initial Impression / Assessment and Plan / ED Course  I have reviewed the triage vital signs and the nursing notes.  Pertinent labs & imaging results that were available during my care of the patient were reviewed by me and considered in my medical decision making (see chart for details).  Clinical Course as of Nov 15 237  Wed Nov 15, 2017  0011 RN reports additional episode of urinary incontinence and MRI.   [HM]  0230 Discussed with Dr. Clyde Lundborg.  He will admit and NSR  will be consulted in the AM.    [HM]  0231 No evidence of urinary tract infection  Ketones, ur: NEGATIVE [HM]    Clinical Course User Index [HM] Hiedi Touchton, Dahlia Client, PA-C    Patient presents in transfer from [facility] due to concerns for possible cauda equina.  Patient with decreased sensation to the right lower extremity and severe difficulty walking.  Labs are reassuring.  No evidence of urinary tract infection.  Patient denies saddle anesthesia.  She has had 3 episodes of urinary incontinence over the last 24 hours.  MRI shows no evidence of cauda equina.  She does have protrusion contact on the descending right L5 nerve root in the right L4-5 lateral recess and mild left L3-4 and mild right L4-5 foraminal stenosis.  I suspect contact with the descending L5 nerve root is the source of her pain.  After multiple therapies, patient mains unable to ambulate.  She will need admission for pain control and nonemergent neurosurgical consultation.  Final Clinical Impressions(s) / ED Diagnoses   Final diagnoses:  Acute right-sided low back pain with right-sided sciatica    Decreased sensation of leg  Gait disturbance  Nerve root compression syndrome    ED Discharge Orders    None       Milta Deiters 11/15/17 0240    Palumbo, April, MD 11/15/17 [phone]

## 2017-11-14 NOTE — ED Provider Notes (Signed)
[facility] EMERGENCY DEPARTMENT Provider Note   CSN: [id number] Arrival date & time: 11/14/17  1946     History   Chief Complaint Chief Complaint  Patient presents with  . Back Pain    HPI Sonya Gibson is a 32 y.o. female.  Patient reports sudden onset of low right sided back pain while attempting to get in to her husbands truck. Pain radiates into right buttock and in to right thigh. No known prior injury to area, but patient's job requires frequent lifting and she endorsed intermittent back pain. She endorses numbness extending into the right leg, and has had two episodes of urinary incontinence.   Back Pain   This is a recurrent problem. The current episode started 1 to 2 hours ago. The problem occurs constantly. The problem has been gradually worsening. The pain is associated with no known injury. The pain is present in the lumbar spine. The quality of the pain is described as shooting and aching. The pain radiates to the right thigh and right foot. The pain is severe. The symptoms are aggravated by certain positions. The pain is the same all the time. Associated symptoms include bladder incontinence, leg pain and tingling. Pertinent negatives include no perianal numbness.    Past Medical History:  Diagnosis Date  . Anxiety   . Depression   . GERD (gastroesophageal reflux disease)     Patient Active Problem List   Diagnosis Date Noted  . GESTATIONAL DIABETES 04/26/2010    Past Surgical History:  Procedure Laterality Date  . ANTERIOR CRUCIATE LIGAMENT REPAIR Left   . CESAREAN SECTION    . middle finger reatachment       OB History   None      Home Medications    Prior to Admission medications   Medication Sig Start Date End Date Taking? Authorizing Provider  clonazePAM (KLONOPIN) 2 MG tablet Take 2 mg by mouth 2 (two) times daily.     [provider]  cyclobenzaprine (FLEXERIL) 10 MG tablet Take 10 mg by mouth 3 (three) times daily as needed for  muscle spasms.     [provider]  gabapentin (NEURONTIN) 100 MG capsule Take 100-300 mg by mouth daily as needed (for pain associated with shingles).    [provider]  HYDROcodone-acetaminophen (NORCO) 5-325 MG tablet Take 1-2 tablets by mouth every 6 (six) hours as needed. 08/15/17   Geoffery Lyons, MD  ibuprofen (ADVIL,MOTRIN) 200 MG tablet Take 1,000 mg by mouth 2 (two) times daily.    [provider]  levothyroxine (SYNTHROID, LEVOTHROID) 50 MCG tablet Take 50 mcg by mouth daily before breakfast.  06/06/17   [provider]  Melatonin 10 MG TABS Take 10 mg by mouth daily as needed (sleep).    [provider]  ondansetron (ZOFRAN ODT) 8 MG disintegrating tablet Take 1 tablet (8 mg total) by mouth every 8 (eight) hours as needed for nausea or vomiting. 12/29/14   Azalia Bilis, MD  traZODone (DESYREL) 150 MG tablet Take 150 mg by mouth at bedtime as needed for sleep.    [provider]  venlafaxine (EFFEXOR) 75 MG tablet Take 75 mg by mouth at bedtime.  and  nightly    [provider]  venlafaxine XR (EFFEXOR-XR) 150 MG 24 hr capsule Take 150 mg by mouth at bedtime.  and  nightly 06/06/17 10/04/17  [provider]    Family History History reviewed. No pertinent family history.  Social History Social History  Tobacco Use  . Smoking status: Never Smoker  . Smokeless tobacco: Never Used  Substance Use Topics  . Alcohol use: Yes    Comment: rare  . Drug use: No     Allergies   Coconut oil and Nitrofurantoin   Review of Systems Review of Systems  Genitourinary: Positive for bladder incontinence.  Musculoskeletal: Positive for back pain.  Neurological: Positive for tingling.  All other systems reviewed and are negative.    Physical Exam Updated Vital Signs BP (!) 138/123   Pulse (!) 110   Temp 98.2 F (36.8 C)   Resp 18   Ht  (1.575 m)   Wt (!) 140.6 kg (310 lb)   LMP  11/01/2017   SpO2 98%   BMI 56.70 kg/m   Physical Exam  Constitutional: She is oriented to person, place, and time. She appears well-developed and well-nourished. She appears distressed.  HENT:  Head: Normocephalic.  Eyes: Conjunctivae are normal.  Neck: Normal range of motion. Neck supple.  Cardiovascular: Regular rhythm.  Pulmonary/Chest: Effort normal and breath sounds normal.  Abdominal: Soft. She exhibits no distension. There is no tenderness.  Musculoskeletal: She exhibits tenderness.  Neurological: She is alert and oriented to person, place, and time. A sensory deficit is present.  Skin: Skin is warm and dry.  Psychiatric: She has a normal mood and affect.  Nursing note and vitals reviewed.    ED Treatments / Results  Labs (all labs ordered are listed, but only abnormal results are displayed) Labs Reviewed  URINALYSIS, ROUTINE W REFLEX MICROSCOPIC    EKG None  Radiology No results found.  Procedures Procedures (including critical care time)  Medications Ordered in ED Medications - No data to display   Initial Impression / Assessment and Plan / ED Course  I have reviewed the triage vital signs and the nursing notes.  Pertinent labs & imaging results that were available during my care of the patient were reviewed by me and considered in my medical decision making (see chart for details).     Patient discussed with Dr. Deretha Emory. With numbness of leg and urinary incontinence, MRI is imaging of choice to assess for CES. MRI is not available here. Patient will be transferred ED to ED [facility]) for imaging. Disposition pending results. Spoke with Dr. Rush Landmark in the [facility] ED regarding patient.  Final Clinical Impressions(s) / ED Diagnoses   Final diagnoses:  None    ED Discharge Orders    None       Felicie Morn, NP 11/14/17 2308    Vanetta Mulders, MD 11/22/17 1214

## 2017-11-14 NOTE — ED Triage Notes (Signed)
Pt with lower back pain since getting up in a truck since yesterday, c/o numbness to right thigh, tried heating pad and ibuprofen.

## 2017-11-15 ENCOUNTER — Encounter (HOSPITAL_COMMUNITY): Payer: Self-pay | Admitting: Internal Medicine

## 2017-11-15 DIAGNOSIS — M5416 Radiculopathy, lumbar region: Secondary | ICD-10-CM

## 2017-11-15 DIAGNOSIS — M545 Low back pain, unspecified: Secondary | ICD-10-CM | POA: Diagnosis present

## 2017-11-15 DIAGNOSIS — F419 Anxiety disorder, unspecified: Secondary | ICD-10-CM | POA: Diagnosis present

## 2017-11-15 DIAGNOSIS — I10 Essential (primary) hypertension: Secondary | ICD-10-CM | POA: Diagnosis present

## 2017-11-15 DIAGNOSIS — R208 Other disturbances of skin sensation: Secondary | ICD-10-CM | POA: Insufficient documentation

## 2017-11-15 DIAGNOSIS — M5441 Lumbago with sciatica, right side: Secondary | ICD-10-CM

## 2017-11-15 DIAGNOSIS — F4310 Post-traumatic stress disorder, unspecified: Secondary | ICD-10-CM | POA: Diagnosis present

## 2017-11-15 DIAGNOSIS — E039 Hypothyroidism, unspecified: Secondary | ICD-10-CM | POA: Diagnosis present

## 2017-11-15 LAB — BASIC METABOLIC PANEL
ANION GAP: 10 (ref 5–15)
BUN: 13 mg/dL (ref 6–20)
CALCIUM: 9 mg/dL (ref 8.9–10.3)
CO2: 26 mmol/L (ref 22–32)
CREATININE: 0.85 mg/dL (ref 0.44–1.00)
Chloride: 102 mmol/L (ref 101–111)
Glucose, Bld: 133 mg/dL — ABNORMAL HIGH (ref 65–99)
Potassium: 4.1 mmol/L (ref 3.5–5.1)
Sodium: 138 mmol/L (ref 135–145)

## 2017-11-15 LAB — CBC
HCT: 35.9 % — ABNORMAL LOW (ref 36.0–46.0)
Hemoglobin: 11.7 g/dL — ABNORMAL LOW (ref 12.0–15.0)
MCH: 29.4 pg (ref 26.0–34.0)
MCHC: 32.6 g/dL (ref 30.0–36.0)
MCV: 90.2 fL (ref 78.0–100.0)
PLATELETS: 333 10*3/uL (ref 150–400)
RBC: 3.98 MIL/uL (ref 3.87–5.11)
RDW: 15.7 % — ABNORMAL HIGH (ref 11.5–15.5)
WBC: 8.4 10*3/uL (ref 4.0–10.5)

## 2017-11-15 LAB — HIV ANTIBODY (ROUTINE TESTING W REFLEX): HIV SCREEN 4TH GENERATION: NONREACTIVE

## 2017-11-15 MED ORDER — TRAZODONE HCL 100 MG PO TABS
150.0000 mg | ORAL_TABLET | Freq: Every evening | ORAL | Status: DC | PRN
  Administered 2017-11-15 – 2017-11-21 (×6): 150 mg via ORAL
  Filled 2017-11-15 (×6): qty 1

## 2017-11-15 MED ORDER — KETOROLAC TROMETHAMINE 60 MG/2ML IM SOLN: 60.0000 mg | Freq: Once | INTRAMUSCULAR | Status: DC

## 2017-11-15 MED ORDER — ZOLPIDEM TARTRATE 5 MG PO TABS: 5.0000 mg | ORAL_TABLET | Freq: Every evening | ORAL | Status: DC | PRN

## 2017-11-15 MED ORDER — IBUPROFEN 200 MG PO TABS
400.0000 mg | ORAL_TABLET | Freq: Four times a day (QID) | ORAL | Status: DC
  Administered 2017-11-15 (×3): 400 mg via ORAL
  Filled 2017-11-15: qty 1
  Filled 2017-11-15 (×2): qty 2

## 2017-11-15 MED ORDER — POLYETHYLENE GLYCOL 3350 17 G PO PACK
17.0000 g | PACK | Freq: Every day | ORAL | Status: DC | PRN
  Administered 2017-11-17 – 2017-11-19 (×2): 17 g via ORAL
  Filled 2017-11-15 (×3): qty 1

## 2017-11-15 MED ORDER — METHYLPREDNISOLONE 4 MG PO TBPK: 8.0000 mg | ORAL_TABLET | Freq: Every evening | ORAL | Status: DC

## 2017-11-15 MED ORDER — PREDNISONE 20 MG PO TABS
60.0000 mg | ORAL_TABLET | Freq: Once | ORAL | Status: AC
Start: 2017-11-15 — End: 2017-11-15
  Administered 2017-11-15: 60 mg via ORAL
  Filled 2017-11-15: qty 3

## 2017-11-15 MED ORDER — HYDROMORPHONE HCL 2 MG/ML IJ SOLN
1.0000 mg | Freq: Four times a day (QID) | INTRAMUSCULAR | Status: DC | PRN
  Administered 2017-11-15 – 2017-11-16 (×3): 1 mg via INTRAVENOUS
  Filled 2017-11-15 (×3): qty 1

## 2017-11-15 MED ORDER — KETOROLAC TROMETHAMINE 30 MG/ML IJ SOLN
30.0000 mg | Freq: Once | INTRAMUSCULAR | Status: AC
  Administered 2017-11-15: 30 mg via INTRAVENOUS
  Filled 2017-11-15: qty 1

## 2017-11-15 MED ORDER — ONDANSETRON HCL 4 MG/2ML IJ SOLN
4.0000 mg | Freq: Three times a day (TID) | INTRAMUSCULAR | Status: DC | PRN
  Administered 2017-11-15 – 2017-11-21 (×12): 4 mg via INTRAVENOUS
  Filled 2017-11-15 (×13): qty 2

## 2017-11-15 MED ORDER — ACETAMINOPHEN 325 MG PO TABS: 650.0000 mg | ORAL_TABLET | Freq: Four times a day (QID) | ORAL | Status: DC | PRN

## 2017-11-15 MED ORDER — IBUPROFEN 400 MG PO TABS: 400.0000 mg | ORAL_TABLET | Freq: Four times a day (QID) | ORAL | Status: DC | PRN

## 2017-11-15 MED ORDER — FENTANYL CITRATE (PF) 100 MCG/2ML IJ SOLN
50.0000 ug | Freq: Once | INTRAMUSCULAR | Status: AC
  Administered 2017-11-15: 50 ug via INTRAVENOUS
  Filled 2017-11-15: qty 2

## 2017-11-15 MED ORDER — METHYLPREDNISOLONE 4 MG PO TBPK
4.0000 mg | ORAL_TABLET | Freq: Three times a day (TID) | ORAL | Status: DC
  Administered 2017-11-16: 4 mg via ORAL

## 2017-11-15 MED ORDER — CYCLOBENZAPRINE HCL 10 MG PO TABS
10.0000 mg | ORAL_TABLET | Freq: Three times a day (TID) | ORAL | Status: DC | PRN
  Administered 2017-11-15 – 2017-11-19 (×7): 10 mg via ORAL
  Filled 2017-11-15 (×7): qty 1

## 2017-11-15 MED ORDER — ENOXAPARIN SODIUM 40 MG/0.4ML ~~LOC~~ SOLN
40.0000 mg | SUBCUTANEOUS | Status: DC
  Administered 2017-11-15: 40 mg via SUBCUTANEOUS
  Filled 2017-11-15: qty 0.4

## 2017-11-15 MED ORDER — GABAPENTIN 100 MG PO CAPS
200.0000 mg | ORAL_CAPSULE | Freq: Three times a day (TID) | ORAL | Status: DC
  Administered 2017-11-15 (×2): 200 mg via ORAL
  Filled 2017-11-15 (×2): qty 2

## 2017-11-15 MED ORDER — LISINOPRIL 10 MG PO TABS
10.0000 mg | ORAL_TABLET | Freq: Every day | ORAL | Status: DC
  Administered 2017-11-15 – 2017-11-21 (×7): 10 mg via ORAL
  Filled 2017-11-15 (×8): qty 1

## 2017-11-15 MED ORDER — VENLAFAXINE HCL ER 150 MG PO CP24
150.0000 mg | ORAL_CAPSULE | Freq: Every day | ORAL | Status: DC
  Administered 2017-11-15 – 2017-11-20 (×6): 150 mg via ORAL
  Filled 2017-11-15 (×7): qty 1

## 2017-11-15 MED ORDER — METHYLPREDNISOLONE 4 MG PO TBPK
4.0000 mg | ORAL_TABLET | ORAL | Status: AC
  Administered 2017-11-15: 4 mg via ORAL

## 2017-11-15 MED ORDER — METHYLPREDNISOLONE 4 MG PO TBPK
8.0000 mg | ORAL_TABLET | Freq: Every morning | ORAL | Status: DC
  Filled 2017-11-15: qty 21

## 2017-11-15 MED ORDER — HYDROCHLOROTHIAZIDE 12.5 MG PO CAPS
12.5000 mg | ORAL_CAPSULE | Freq: Every day | ORAL | Status: DC
  Administered 2017-11-15 – 2017-11-21 (×7): 12.5 mg via ORAL
  Filled 2017-11-15 (×7): qty 1

## 2017-11-15 MED ORDER — METHYLPREDNISOLONE 4 MG PO TBPK
4.0000 mg | ORAL_TABLET | ORAL | Status: DC
  Filled 2017-11-15 (×2): qty 21

## 2017-11-15 MED ORDER — HYDROMORPHONE HCL 2 MG/ML IJ SOLN
1.0000 mg | INTRAMUSCULAR | Status: DC | PRN
  Administered 2017-11-15: 1 mg via INTRAVENOUS
  Filled 2017-11-15: qty 1

## 2017-11-15 MED ORDER — CLONAZEPAM 1 MG PO TABS
2.0000 mg | ORAL_TABLET | Freq: Two times a day (BID) | ORAL | Status: DC
  Administered 2017-11-15 – 2017-11-21 (×13): 2 mg via ORAL
  Filled 2017-11-15: qty 4
  Filled 2017-11-15 (×12): qty 2

## 2017-11-15 MED ORDER — METHOCARBAMOL 500 MG PO TABS
1000.0000 mg | ORAL_TABLET | Freq: Once | ORAL | Status: AC
  Administered 2017-11-15: 1000 mg via ORAL
  Filled 2017-11-15: qty 2

## 2017-11-15 MED ORDER — GABAPENTIN 100 MG PO CAPS: 100.0000 mg | ORAL_CAPSULE | Freq: Every day | ORAL | Status: DC | PRN

## 2017-11-15 MED ORDER — METHYLPREDNISOLONE 4 MG PO TBPK: 4.0000 mg | ORAL_TABLET | Freq: Four times a day (QID) | ORAL | Status: DC

## 2017-11-15 MED ORDER — METHOCARBAMOL 1000 MG/10ML IJ SOLN: 1000.0000 mg | Freq: Once | INTRAMUSCULAR | Status: DC

## 2017-11-15 MED ORDER — HYDRALAZINE HCL 20 MG/ML IJ SOLN: 5.0000 mg | INTRAMUSCULAR | Status: DC | PRN

## 2017-11-15 MED ORDER — OXYCODONE-ACETAMINOPHEN 5-325 MG PO TABS
1.0000 | ORAL_TABLET | ORAL | Status: DC | PRN
  Administered 2017-11-15 – 2017-11-16 (×3): 1 via ORAL
  Filled 2017-11-15 (×4): qty 1

## 2017-11-15 MED ORDER — MELATONIN 3 MG PO TABS
9.0000 mg | ORAL_TABLET | Freq: Every evening | ORAL | Status: DC | PRN
  Filled 2017-11-15: qty 3

## 2017-11-15 MED ORDER — CLONAZEPAM 1 MG PO TABS
1.0000 mg | ORAL_TABLET | Freq: Three times a day (TID) | ORAL | Status: DC | PRN
  Administered 2017-11-15: 1 mg via ORAL
  Filled 2017-11-15: qty 2

## 2017-11-15 MED ORDER — LEVOTHYROXINE SODIUM 50 MCG PO TABS
50.0000 ug | ORAL_TABLET | Freq: Every day | ORAL | Status: DC
  Administered 2017-11-15 – 2017-11-21 (×7): 50 ug via ORAL
  Filled 2017-11-15 (×8): qty 1

## 2017-11-15 NOTE — Progress Notes (Signed)
  PROGRESS NOTE    Sonya Gibson  ZOX:[id number] DOB: [birth year] DOA: 11/14/2017 PCP: Sonya Albright, NP   32 year old female with history of hypertension, GERD, PTSD, hypothyroidism, depression, anxiety came to the hospital with complaints of lower back pain.  Apparently patient was trying get with her husband's truck when she started experiencing lower back pain 2 days ago.  Due to persistence of the symptoms along with radiation of the pain in the right lower extremity she came to the ER for further evaluation.  Initially she also reported of urinary incontinence therefore MRI of the lower spine was done which showed no acute malalignment or abnormality but small disc protrusion in the lumbar area along with foraminal stenosis, protrusion contact of the right L5 nerve root. When I saw the patient she reported of the similar complaints.  She was able to bear weight on her legs.  General = no fevers, chills, dizziness, malaise, fatigue HEENT/EYES = negative for pain, redness, loss of vision, double vision, blurred vision, loss of hearing, sore throat, hoarseness, dysphagia Cardiovascular= negative for chest pain, palpitation, murmurs, lower extremity swelling Respiratory/lungs= negative for shortness of breath, cough, hemoptysis, wheezing, mucus production Gastrointestinal= negative for nausea, vomiting,, abdominal pain, melena, hematemesis Genitourinary= negative for Dysuria, Hematuria, Change in Urinary Frequency MSK = Negative for arthralgia, myalgias, Joint swelling  Neurology= Negative for headache, seizures, numbness, tingling  Psychiatry= Negative for anxiety, depression, suicidal and homocidal ideation Allergy/Immunology= Medication/Food allergy as listed  Skin= Negative for Rash, lesions, ulcers, itching  Vitals:   11/15/17 0825 11/15/17 1130  BP: 120/73 116/69  Pulse: 99   Resp: 20   Temp:    SpO2: 96%    Constitutional: NAD, calm, comfortable Eyes: PERRL, lids and  conjunctivae normal ENMT: Mucous membranes are moist. Posterior pharynx clear of any exudate or lesions.Normal dentition.  Neck: normal, supple, no masses, no thyromegaly Respiratory: clear to auscultation bilaterally, no wheezing, no crackles. Normal respiratory effort. No accessory muscle use.  Cardiovascular: Regular rate and rhythm, no murmurs / rubs / gallops. No extremity edema. 2+ pedal pulses. No carotid bruits.  Abdomen: no tenderness, no masses palpated. No hepatosplenomegaly. Bowel sounds positive.  Musculoskeletal: no clubbing / cyanosis. No joint deformity upper and lower extremities. Good ROM, no contractures. Normal muscle tone.  Skin: no rashes, lesions, ulcers. No induration Neurologic: CN 2-12 grossly intact. Sensation intact, DTR normal. Strength 5/5 in all 4.  Psychiatric: Normal judgment and insight. Alert and oriented x 3. Normal mood.   Assessment and plan  Lower back pain-lumbar radiculopathy - Conservative management at this time.  Due to concerns of nerve root compression, will start patient on Medrol Dosepak, PT/OT.  The pain control-we will put patient on ibuprofen 4 times daily scheduled for 5 days, add gabapentin 200 mg 3 times daily, reduce the frequency of IV Dilaudid.  Decrease the need for p.o. narcotics.  Bowel regimen if necessary.  Eventually she may need follow-up with outpatient neurosurgery or orthopedic spine for further surgical intervention if necessary.  History of hypothyroidism-continue Synthroid  Anxiety and PTSD-continue home medications  Essential hypertension-stable  Morbid obesity -Counseled on weight loss, diet and exercise.  Advised to follow-up outpatient for bariatric surgery evaluation.  Ankit Joline Maxcy, MD [facility] Pager [phone]   If 7PM-7AM, please contact night-coverage [email or web address] Password [facility] 11/15/2017, 12:48 PM

## 2017-11-15 NOTE — ED Notes (Signed)
ED Provider at bedside. 

## 2017-11-15 NOTE — ED Notes (Signed)
Admitting MD - Dr. Nelson Chimes, paged to [facility] @ [phone].

## 2017-11-15 NOTE — H&P (Signed)
History and Physical    Sonya Gibson ZOX:[id number] DOB: Oct 28, 1985 DOA: 11/14/2017  Referring MD/NP/PA:   PCP: Rhodia Albright, NP   Patient coming from:  The patient is coming from home.  At baseline, pt is independent for most of ADL.  Chief Complaint: lower back pain and urinary incontinence  HPI: Sonya Gibson is a 32 y.o. female with medical history significant of hypertension, GERD, PTSD, hypothyroidism, depression with anxiety, who presents with lower back pain and urinary incontinence.  Pt states that her lower back pain started when she was attempting to get into her husband's truck 2 days ago. It has been progressively getting worse. The pain is constant, sharp, 10 out of 10 in severity, radiating to her right buttock and into the right thigh. She has right thigh numbness and 4 times of urinary incontinence. No leg weakness. The pain is so severe that she is having difficulty using the bathroom because she is unable to get up off the toilet. Does not have chest pain, shortness breath, cough, no nausea, vomiting, diarrhea, abdominal pain, symptoms of UTI.  ED Course: pt was found to have WBC 8.3, electrolytes renal function okay, negative urinalysis, temperature normal, oxygen saturation section 98% on room air. Patient is placed on MedSurg bed for observation.  # MRI of L spin showed  1. No acute osseous abnormality or malalignment. 2. Small disc protrusions are present at the L3-4, L4-5, and L5-S1levels. 3. Mild left L3-4 and mild right L4-5 foraminal stenosis. No significant canal stenosis. 4. Protrusion contact on the descending right L5 nerve root in the right L4-5 lateral recess.  Review of Systems:   General: no fevers, chills, no body weight gain, has fatigue HEENT: no blurry vision, hearing changes or sore throat Respiratory: no dyspnea, coughing, wheezing CV: no chest pain, no palpitations GI: no nausea, vomiting, abdominal pain, diarrhea, constipation GU:  no dysuria, burning on urination, increased urinary frequency, hematuria  Ext: no leg edema Neuro: no unilateral weakness, numbness, or tingling, no vision change or hearing loss Skin: no rash, no skin tear. MSK: has back pain Heme: No easy bruising.  Travel history: No recent long distant travel.  Allergy:  Allergies  Allergen Reactions  . Coconut Oil Anaphylaxis  . Nitrofurantoin Hives    Past Medical History:  Diagnosis Date  . Anxiety   . Depression   . Essential hypertension   . GERD (gastroesophageal reflux disease)   . Hypothyroidism   . PTSD (post-traumatic stress disorder)     Past Surgical History:  Procedure Laterality Date  . ANTERIOR CRUCIATE LIGAMENT REPAIR Left   . CESAREAN SECTION    . middle finger reatachment      Social History:  reports that she has never smoked. She has never used smokeless tobacco. She reports that she drinks alcohol. She reports that she does not use drugs.  Family History:  Family History  Problem Relation Age of Onset  . Diabetes Mellitus II Mother   . Hypertension Mother   . Seizures Mother   . Hypertension Father   . Diabetes Mellitus II Father   . Hyperlipidemia Father   . Bipolar disorder Sister      Prior to Admission medications   Medication Sig Start Date End Date Taking? Authorizing Provider  clonazePAM (KLONOPIN) 2 MG tablet Take 2 mg by mouth 2 (two) times daily.     [provider]  cyclobenzaprine (FLEXERIL) 10 MG tablet Take 10 mg by mouth 3 (three) times  daily as needed for muscle spasms.     [provider]  gabapentin (NEURONTIN) 100 MG capsule Take 100-300 mg by mouth daily as needed (for pain associated with shingles).    [provider]  HYDROcodone-acetaminophen (NORCO) 5-325 MG tablet Take 1-2 tablets by mouth every 6 (six) hours as needed. 08/15/17   Geoffery Lyons, MD  ibuprofen (ADVIL,MOTRIN) 200 MG tablet Take 1,000 mg by mouth 2 (two) times daily.    [provider]  levothyroxine (SYNTHROID, LEVOTHROID) 50 MCG tablet Take 50 mcg by mouth daily before breakfast.  06/06/17   [provider]  Melatonin 10 MG TABS Take 10 mg by mouth daily as needed (sleep).    [provider]  ondansetron (ZOFRAN ODT) 8 MG disintegrating tablet Take 1 tablet (8 mg total) by mouth every 8 (eight) hours as needed for nausea or vomiting. 12/29/14   Azalia Bilis, MD  traZODone (DESYREL) 150 MG tablet Take 150 mg by mouth at bedtime as needed for sleep.    [provider]  venlafaxine (EFFEXOR) 75 MG tablet Take 75 mg by mouth at bedtime.  and  nightly    [provider]  venlafaxine XR (EFFEXOR-XR) 150 MG 24 hr capsule Take 150 mg by mouth at bedtime.  and  nightly 06/06/17 10/04/17  [provider]    Physical Exam: Vitals:   11/14/17 1950 11/14/17 2146 11/15/17 0324  BP: (!) 138/123 129/84 112/69  Pulse: (!) 110 95 95  Resp: Temp: 98.2 F (36.8 C) 98.2 F (36.8 C) 97.9 F (36.6 C)  TempSrc:  Oral Oral  SpO2: 98% 98% 96%  Weight: (!) 140.6 kg (310 lb)    Height:  (1.575 m)     General: Not in acute distress HEENT:       Eyes: PERRL, EOMI, no scleral icterus.       ENT: No discharge from the ears and nose, no pharynx injection, no tonsillar enlargement.        Neck: No JVD, no bruit, no mass felt. Heme: No neck lymph node enlargement. Cardiac: S1/S2, RRR, No murmurs, No gallops or rubs. Respiratory: No rales, wheezing, rhonchi or rubs. GI: Soft, nondistended, nontender, no rebound pain, no organomegaly, BS present. GU: No hematuria Ext: No pitting leg edema bilaterally. 2+DP/PT pulse bilaterally. Musculoskeletal: tenderness in the midline of lower back  Skin: No rashes.  Neuro: Alert, oriented X3, cranial nerves II-XII grossly intact, moves all extremities. Labs on Admission: I have personally reviewed following labs and imaging studies  CBC: Recent Labs  Lab 11/14/17 2156  WBC  8.3  NEUTROABS 4.5  HGB 12.1  HCT 38.0  MCV 90.7  PLT 338   Basic Metabolic Panel: Recent Labs  Lab 11/14/17 2156  NA 139  K 4.0  CL 100*  CO2 29  GLUCOSE 92  BUN 12  CREATININE 0.79  CALCIUM 9.3   GFR: Estimated Creatinine Clearance: 137.5 mL/min (by C-G formula based on SCr of 0.79 mg/dL). Liver Function Tests: No results for input(s): AST, ALT, ALKPHOS, BILITOT, PROT, ALBUMIN in the last 168 hours. No results for input(s): LIPASE, AMYLASE in the last 168 hours. No results for input(s): AMMONIA in the last 168 hours. Coagulation Profile: No results for input(s): INR, PROTIME in the last 168 hours. Cardiac Enzymes: No results for input(s): CKTOTAL, CKMB, CKMBINDEX, TROPONINI in the last 168 hours. BNP (last 3 results) No results for input(s): PROBNP in the last 8760 hours. HbA1C: No  results for input(s): HGBA1C in the last 72 hours. CBG: No results for input(s): GLUCAP in the last 168 hours. Lipid Profile: No results for input(s): CHOL, HDL, LDLCALC, TRIG, CHOLHDL, LDLDIRECT in the last 72 hours. Thyroid Function Tests: No results for input(s): TSH, T4TOTAL, FREET4, T3FREE, THYROIDAB in the last 72 hours. Anemia Panel: No results for input(s): VITAMINB12, FOLATE, FERRITIN, TIBC, IRON, RETICCTPCT in the last 72 hours. Urine analysis:    Component Value Date/Time   COLORURINE YELLOW 11/14/2017 2051   APPEARANCEUR HAZY (A) 11/14/2017 2051   LABSPEC 1.028 11/14/2017 2051   PHURINE 5.0 11/14/2017 2051   GLUCOSEU NEGATIVE 11/14/2017 2051   HGBUR NEGATIVE 11/14/2017 2051   BILIRUBINUR NEGATIVE 11/14/2017 2051   KETONESUR NEGATIVE 11/14/2017 2051   PROTEINUR NEGATIVE 11/14/2017 2051   UROBILINOGEN 1.0 05/04/2015 0349   NITRITE NEGATIVE 11/14/2017 2051   LEUKOCYTESUR NEGATIVE 11/14/2017 2051   Sepsis Labs: (procalcitonin:4,lacticidven:4) )No results found for this or any previous visit (from the past 240 hour(s)).   Radiological Exams on Admission: Mr  Lumbar Spine Wo Contrast  Result Date: 11/15/2017 CLINICAL DATA:  32 y/o F; sudden onset lower right-sided back pain radiating into the right buttocks and right thigh. EXAM: MRI LUMBAR SPINE WITHOUT CONTRAST TECHNIQUE: Multiplanar, multisequence MR imaging of the lumbar spine was performed. No intravenous contrast was administered. COMPARISON:  05/01/2016 CT abdomen and pelvis. FINDINGS: Segmentation:  Standard. Alignment:  Physiologic. Vertebrae:  No fracture, evidence of discitis, or bone lesion. Conus medullaris and cauda equina: Conus extends to the L1 level. Fatty filum. Paraspinal and other soft tissues: Negative. Disc levels: L1-2: No significant disc displacement, foraminal stenosis, or canal stenosis. L2-3: No significant disc displacement, foraminal stenosis, or canal stenosis. L3-4: Small left foraminal disc protrusion with mild left foraminal stenosis. No canal stenosis. L4-5: Small right subarticular and foraminal disc protrusion which contacts the descending L5 nerve root in the right lateral recess (series 8, image 20) and results in mild right-sided foraminal stenosis. No canal stenosis. L5-S1: Small central disc protrusion. No significant foraminal or canal stenosis. IMPRESSION: 1. No acute osseous abnormality or malalignment. 2. Small disc protrusions are present at the L3-4, L4-5, and L5-S1 levels. 3. Mild left L3-4 and mild right L4-5 foraminal stenosis. No significant canal stenosis. 4. Protrusion contact on the descending right L5 nerve root in the right L4-5 lateral recess. Electronically Signed   By: Mitzi Hansen M.D.   On: 11/15/2017 01:02     EKG: Not done in ED  Assessment/Plan Principal Problem:   Lower back pain Active Problems:   Hypothyroidism   Anxiety   Essential hypertension   PTSD (post-traumatic stress disorder)   Lower back pain: MRI showed no evidence of cauda equina, but showed multiple disc problems, including protrusion contact on the descending  right L5 nerve root in the right L4-5 lateral recess and mild left L3-4 and mild right L4-5 foraminal stenosis, which likely explains her symptoms. Pt still has severe pain, limiting her activity.  -will place on med-surg bed for obs -pain control: pr Percocet, Dilaudid, ibuprofenn, Tylenol -PRN Flexeril -Patient received 1 dose of prednisone 60 mg in ED - pt/ot  Hypothyroidism: Last TSH was 1.64 on 04/26/10 -Continue home Synthroid  Anxiety and  PTSD: -Continue home Klonopin, Effexor  Essential hypertension: -Hydralazine PRNcontinue prinzide -IV    DVT ppx: SQ Lovenox Code Status: Full code Family Communication:  Yes, patient's husband at bed side Disposition Plan:  Anticipate discharge back to previous home environment Consults called:  none Admission status: medical floor/obs           Date of Service 11/15/2017    Lorretta Harp [facility] Pager [phone]  If 7PM-7AM, please contact night-coverage [email or web address] Password [facility] 11/15/2017, 4:13 AM

## 2017-11-15 NOTE — Evaluation (Signed)
Physical Therapy Evaluation Patient Details Name: Sonya Gibson MRN: [id number] DOB: 06/27/1985 Today's Date: 11/15/2017   History of Present Illness  Pt is a 32 y/o female presenting with increased back pain after climbing into truck. Imaging revealed disk protrusions throughout lumbar spine with some impingement on L5. PMH includes HTN, PTSD, anxiety, and depression.   Clinical Impression  Pt admitted secondary to problem above with deficits below. Pt with increased back pain with movement, however, was able to stand at EOB and march in place. Required supervision to min guard for mobility with RW. Pt very tearful throughout and unable to tolerate gait this session. Anticipate pt will progress well once pain controlled. Reviewed back precautions with pt to assist with pain management. Would benefit from continued PT services, however, unsure if pt eligible. Will continue to follow acutely and progress mobility according to pt tolerance.     Follow Up Recommendations Home health PT;Supervision for mobility/OOB    Equipment Recommendations  Other (comment);3in1 (PT)(bariatric RW vs WC;  bari 3 in 1; bari tub bench )    Recommendations for Other Services       Precautions / Restrictions Precautions Precautions: Back Precaution Booklet Issued: No Precaution Comments: Reviewed back precautions with pt to help with pain management.  Restrictions Weight Bearing Restrictions: No      Mobility  Bed Mobility Overal bed mobility: Needs Assistance Bed Mobility: Rolling;Sidelying to Sit;Sit to Sidelying Rolling: Supervision Sidelying to sit: Supervision     Sit to sidelying: Min assist General bed mobility comments: Supervision for safety to come up to sitting. Cues for log roll technique. Min A for LE lift assist.   Transfers Overall transfer level: Needs assistance Equipment used: Rolling walker (2 wheeled) Transfers: Sit to/from Stand Sit to Stand: Min guard         General  transfer comment: Min guard for steadying assist. Verbal cues for hand placement. Able to march in place, however, pt very tearful and unable to perform further mobility.   Ambulation/Gait             General Gait Details: Deferred secondary to increased pain  Stairs            Wheelchair Mobility    Modified Rankin (Stroke Patients Only)       Balance Overall balance assessment: Needs assistance Sitting-balance support: No upper extremity supported;Feet supported Sitting balance-Leahy Scale: Good     Standing balance support: Bilateral upper extremity supported;During functional activity Standing balance-Leahy Scale: Poor Standing balance comment: Reliant on BUE support                              Pertinent Vitals/Pain Pain Assessment: 0-10 Pain Score: 10-Worst pain ever Pain Location: back Pain Descriptors / Indicators: Crying;Constant;Sharp Pain Intervention(s): Limited activity within patient's tolerance;Monitored during session;Repositioned    Home Living Family/patient expects to be discharged to:: Private residence Living Arrangements: Spouse/significant other;Children Available Help at Discharge: Family;Available PRN/intermittently Type of Home: House Home Access: Ramped entrance     Home Layout: One level Home Equipment: None      Prior Function Level of Independence: Independent         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     Cervical / Trunk Assessment Cervical / Trunk Assessment: Other exceptions Cervical / Trunk Exceptions: Sharp low back pain  Communication   Communication: No difficulties  Cognition Arousal/Alertness:  Awake/alert Behavior During Therapy: WFL for tasks assessed/performed Overall Cognitive Status: Within Functional Limits for tasks assessed                                        General Comments General comments (skin integrity, edema, etc.): Pt's husband present during session    Exercises     Assessment/Plan    PT Assessment Patient needs continued PT services  PT Problem List Decreased activity tolerance;Decreased mobility;Decreased knowledge of use of DME;Decreased knowledge of precautions;Pain;Impaired sensation       PT Treatment Interventions DME instruction;Gait training;Functional mobility training;Therapeutic activities;Therapeutic exercise;Balance training;Patient/family education    PT Goals (Current goals can be found in the Care Plan section)  Acute Rehab PT Goals Patient Stated Goal: to decrease pain  PT Goal Formulation: With patient Time For Goal Achievement: 11/29/17 Potential to Achieve Goals: Good    Frequency Min 3X/week   Barriers to discharge        Co-evaluation               AM-PAC PT "6 Clicks" Daily Activity  Outcome Measure Difficulty turning over in bed (including adjusting bedclothes, sheets and blankets)?: A Little Difficulty moving from lying on back to sitting on the side of the bed? : A Little Difficulty sitting down on and standing up from a chair with arms (e.g., wheelchair, bedside commode, etc,.)?: Unable Help needed moving to and from a bed to chair (including a wheelchair)?: A Little Help needed walking in hospital room?: A Lot Help needed climbing 3-5 steps with a railing? : A Lot 6 Click Score: 14    End of Session Equipment Utilized During Treatment: Gait belt Activity Tolerance: Patient limited by pain Patient left: in bed;with call bell/phone within reach;with family/visitor present Nurse Communication: Mobility status PT Visit Diagnosis: Difficulty in walking, not elsewhere classified  (R26.2);Pain Pain - part of body: (pain)    Time: 1220-1240 PT Time Calculation (min) (ACUTE ONLY): 20 min   Charges:   PT Evaluation $PT Eval Moderate Complexity: 1 Mod     PT G Codes:        Gladys Damme, PT, DPT  Acute Rehabilitation Services  Pager: [phone]   Lehman Prom 11/15/2017, 3:35 PM

## 2017-11-15 NOTE — ED Notes (Signed)
Dr Niu at bedside 

## 2017-11-15 NOTE — ED Notes (Addendum)
Heart Healthy Diet was ordered for Dinner. 

## 2017-11-15 NOTE — Plan of Care (Signed)
  Problem: Education: Goal: Knowledge of General Education information will improve Outcome: Progressing   Problem: Clinical Measurements: Goal: Ability to maintain clinical measurements within normal limits will improve Outcome: Progressing   Problem: Activity: Goal: Risk for activity intolerance will decrease Outcome: Progressing   Problem: Pain Managment: Goal: General experience of comfort will improve Outcome: Progressing

## 2017-11-16 ENCOUNTER — Encounter (HOSPITAL_COMMUNITY): Payer: Self-pay | Admitting: Neurological Surgery

## 2017-11-16 MED ORDER — OXYCODONE HCL 5 MG PO TABS
10.0000 mg | ORAL_TABLET | ORAL | Status: DC | PRN
  Administered 2017-11-16 – 2017-11-21 (×17): 10 mg via ORAL
  Filled 2017-11-16 (×17): qty 2

## 2017-11-16 MED ORDER — GABAPENTIN 300 MG PO CAPS
300.0000 mg | ORAL_CAPSULE | Freq: Three times a day (TID) | ORAL | Status: DC
  Administered 2017-11-16 – 2017-11-18 (×7): 300 mg via ORAL
  Filled 2017-11-16 (×7): qty 1

## 2017-11-16 MED ORDER — DEXAMETHASONE SODIUM PHOSPHATE 4 MG/ML IJ SOLN
4.0000 mg | Freq: Four times a day (QID) | INTRAMUSCULAR | Status: AC
  Administered 2017-11-16 – 2017-11-17 (×4): 4 mg via INTRAVENOUS
  Filled 2017-11-16 (×4): qty 1

## 2017-11-16 MED ORDER — KETOROLAC TROMETHAMINE 30 MG/ML IJ SOLN
30.0000 mg | Freq: Once | INTRAMUSCULAR | Status: AC
  Administered 2017-11-16: 30 mg via INTRAVENOUS
  Filled 2017-11-16: qty 1

## 2017-11-16 MED ORDER — DEXAMETHASONE 4 MG PO TABS: 4.0000 mg | ORAL_TABLET | Freq: Three times a day (TID) | ORAL | Status: DC

## 2017-11-16 NOTE — Progress Notes (Signed)
Pt requested to have IV Dilaudid pain medication instead of Oxycodone tablets.  Pt was informed that due to transition to home that a new order was placed to discontinue Klonopin and Dilaudid. Pt states " that doctor doesn't know what he is doing.  I feel sorry for you staff members because I need my medicine."  Pt not showing any signs of acute distress at this time and verbalized increased pain due to PT assessment. Pt was then informed of time for next dose of prn pain medication.

## 2017-11-16 NOTE — Consult Note (Signed)
Subjective: Patient is a 32 y.o. female who complains of severe right-sided low back pain which radiates into the right buttock and down the right leg with associated numbness. Onset of symptoms was 3 days ago, unchanged since that time.  Onset was not related to a fall. The pain is rated severe, and is located at the across the lower back. The pain is described as aching and occurs all day. The symptoms has been progressive. Symptoms are exacerbated by standing. The patient has tried analgesics and steroids. MRI showed small disc herniation L4-5 on the right   Past Medical History:  Diagnosis Date  . Anxiety   . Depression   . Essential hypertension   . GERD (gastroesophageal reflux disease)   . Hypothyroidism   . PTSD (post-traumatic stress disorder)     Past Surgical History:  Procedure Laterality Date  . ANTERIOR CRUCIATE LIGAMENT REPAIR Left   . CESAREAN SECTION    . middle finger reatachment      Allergies  Allergen Reactions  . Coconut Oil Anaphylaxis  . Nitrofurantoin Hives    Social History   Tobacco Use  . Smoking status: Never Smoker  . Smokeless tobacco: Never Used  Substance Use Topics  . Alcohol use: Yes    Comment: rare    Family History  Problem Relation Age of Onset  . Diabetes Mellitus II Mother   . Hypertension Mother   . Seizures Mother   . Hypertension Father   . Diabetes Mellitus II Father   . Hyperlipidemia Father   . Bipolar disorder Sister    Prior to Admission medications   Medication Sig Start Date End Date Taking? Authorizing Provider  clonazePAM (KLONOPIN) 2 MG tablet Take 2 mg by mouth 2 (two) times daily.    Yes [provider]  Cyanocobalamin (VITAMIN B-12 IJ) Inject 1 application as directed every 14 (fourteen) days.   Yes [provider]  cyclobenzaprine (FLEXERIL) 10 MG tablet Take 10 mg by mouth 3 (three) times daily as needed for muscle spasms.    Yes [provider]  gabapentin (NEURONTIN) 100 MG capsule  Take 100-300 mg by mouth daily as needed (for pain associated with shingles).   Yes [provider]  HYDROcodone-acetaminophen (NORCO) 5-325 MG tablet Take 1-2 tablets by mouth every 6 (six) hours as needed. Patient taking differently: Take 1-2 tablets by mouth every 6 (six) hours as needed for moderate pain.  08/15/17  Yes Delo, Riley Lam, MD  ibuprofen (ADVIL,MOTRIN) 200 MG tablet Take 200-800 mg by mouth every 6 (six) hours as needed for moderate pain.    Yes [provider]  levothyroxine (SYNTHROID, LEVOTHROID) 50 MCG tablet Take 50 mcg by mouth at bedtime.  06/06/17  Yes [provider]  Melatonin 10 MG TABS Take 10 mg by mouth daily as needed (sleep).   Yes [provider]  ondansetron (ZOFRAN ODT) 8 MG disintegrating tablet Take 1 tablet (8 mg total) by mouth every 8 (eight) hours as needed for nausea or vomiting. 12/29/14  Yes Azalia Bilis, MD  traZODone (DESYREL) 150 MG tablet Take 150 mg by mouth at bedtime as needed for sleep.   Yes [provider]  venlafaxine (EFFEXOR) 75 MG tablet Take 225 mg by mouth at bedtime.  and  nightly    Yes [provider]  venlafaxine XR (EFFEXOR-XR) 150 MG 24 hr capsule Take 225 mg by mouth at bedtime.  and  nightly 06/06/17 11/15/17 Yes [provider]  Vitamin D, Ergocalciferol, (  DRISDOL) 50000 units CAPS capsule Take 50,000 Units by mouth every 7 (seven) days.   Yes [provider]     Review of Systems  Positive ROS: Negative  All other systems have been reviewed and were otherwise negative with the exception of those mentioned in the HPI and as above.  Objective: Vital signs in last 24 hours: Temp:  [97.7 F (36.5 C)-98.2 F (36.8 C)] 98.2 F (36.8 C) (05/30 0524) Pulse Rate:  [88-103] 96 (05/30 0524) Resp:  [18-20] 18 (05/29 1807) BP: (102-116)/(54-85) 116/85 (05/30 0524) SpO2:  [90 %-98 %] 98 % (05/30 0524)  General Appearance: Alert, cooperative, no  distress, appears stated age Head: Normocephalic, without obvious abnormality, atraumatic Eyes: PERRL, conjunctiva/corneas clear, EOM's intact   Throat: Lips, mucosa, and tongue normal; teeth and gums normal Neck: Supple Back: Symmetric Lungs:  respirations unlabored Heart: Regular rate and rhythm Abdomen: Soft Extremities: Extremities normal, atraumatic, no cyanosis or edema Pulses: 2+ and symmetric all extremities Skin: Skin color, texture, turgor normal, no rashes or lesions  NEUROLOGIC:   Mental status: alert and oriented, no aphasia, good attention span, Fund of knowledge/ memory ok Motor Exam - grossly normal with some pain related decreased movement in the right leg Sensory Exam - decreased to gross touch in the right lower extremity Reflexes: Not tested Coordination - grossly normal Gait - not tested Balance -not tested Cranial Nerves: I: smell Not tested  II: visual acuity  OS: na    OD: na  II: visual fields Full to confrontation  II: pupils Equal, round, reactive to light  III,VII: ptosis None  III,IV,VI: extraocular muscles  Full ROM  V: mastication Normal  V: facial light touch sensation  Normal  V,VII: corneal reflex  Present  VII: facial muscle function - upper  Normal  VII: facial muscle function - lower Normal  VIII: hearing Not tested  IX: soft palate elevation  Normal  IX,X: gag reflex Present  XI: trapezius strength  5/5  XI: sternocleidomastoid strength 5/5  XI: neck flexion strength  5/5  XII: tongue strength  Normal    Data Review Lab Results  Component Value Date   WBC 8.4 11/15/2017   HGB 11.7 (L) 11/15/2017   HCT 35.9 (L) 11/15/2017   MCV 90.2 11/15/2017   PLT 333 11/15/2017   Lab Results  Component Value Date   NA 138 11/15/2017   K 4.1 11/15/2017   CL 102 11/15/2017   CO2 26 11/15/2017   BUN 13 11/15/2017   CREATININE 0.85 11/15/2017   GLUCOSE 133 (H) 11/15/2017   No results found for: INR, PROTIME   MRI of lumbar spine  reviewed as well as the report. I think there is a small focal right-sided disc protrusion at L4-5 that could irritate the right L5 nerve root. No high-grade stenosis or spondylolisthesis or fracture or infection noted  Assessment/Plan: Morbidly obese young white female with a small right-sided acute disc herniation at L4-5 causing a right L5 radiculopathy. Pain is severe. She complains of weakness and numbness but I think most of the "weakness" is pain related. I do not believe that early urgent surgical intervention is warranted. I would treat with Neurontin, steroids, NSAIDs, analgesics, and I've ordered a right L4-5 epidural steroid injection and therefore I have stopped her Lovenox. Agree with physical therapy.   Sonya Gibson S 11/16/2017 9:30 AM

## 2017-11-16 NOTE — Progress Notes (Signed)
PROGRESS NOTE    Sonya Gibson  EAV:[id number] DOB: 05-27-1986 DOA: 11/14/2017 PCP: Rhodia Albright, NP   Brief Narrative:  32 year old with history of essential hypertension, GERD, PTSD, hypothyroidism, depression, anxiety came to the hospital with complains of lower back pain which started 2 days prior to her admission.  Apparently she was trying to get into her husband's truck when she started experiencing sudden lower back pain.  In the ER patient had an MRI of the back done which showed small disc protrusion in the lumbar area with foraminal stenosis, protrusion contact with a right L5 nerve root.  She was started on steroids and conservative management but the following day her weakness slightly worsened therefore I consulted neurosurgery.  Neurosurgery recommended getting epidural steroid injection otherwise no acute surgical indication at this time.   Assessment & Plan:   Principal Problem:   Lower back pain Active Problems:   Hypothyroidism   Anxiety   Essential hypertension   PTSD (post-traumatic stress disorder)  Acute low back pain secondary to lumbar radiculopathy Small right-sided acute disc herniation - At this point continue pain management with Neurontin, NSAIDs and other analgesic.  Stopped IV Dilaudid, changed oral Percocet 5-325 to  oral oxycodone 10 mg - I have added Medrol pack yesterday, increased to Decadron today -Neurosurgery consulted today due to worsening of her pain, appreciate their input - Patient scheduled for epidural steroid injection -Provide supportive care -PT recommended home PT  History of hypothyroidism -Continue Synthroid  History of anxiety, PTSD and depression - Continue medication  Essential hypertension -Stable  Morbid obesity with BMI greater than 85 -Advised to follow-up outpatient with bariatric surgery for evaluation.  Counseled on weight loss, diet and exercise  DVT prophylaxis: Lovenox and hold Code Status: Full  code Family Communication: Husband at bedside Disposition Plan: Likely discharge next 24 hours  Consultants:   Neurosurgery  Procedures:   None  Antimicrobials:   None   Subjective: Reports of increasing right lower extremity weakness and numbness.  Review of Systems Otherwise negative except as per HPI, including: General: Denies fever, chills, night sweats or unintended weight loss. Resp: Denies cough, wheezing, shortness of breath. Cardiac: Denies chest pain, palpitations, orthopnea, paroxysmal nocturnal dyspnea. GI: Denies abdominal pain, nausea, vomiting, diarrhea or constipation GU: Denies dysuria, frequency, hesitancy or incontinence MS: Denies muscle aches, joint pain or swelling Neuro: Denies headache,abnormal gait Psych: Denies anxiety, depression, SI/HI/AVH Skin: Denies new rashes or lesions ID: Denies sick contacts, exotic exposures, travel  Objective: Vitals:   11/15/17 1600 11/15/17 1807 11/15/17 2229 11/16/17 0524  BP: 105/64 102/61 112/69 116/85  Pulse: 88 89 91 96  Resp:  18    Temp:  97.8 F (36.6 C) 97.7 F (36.5 C) 98.2 F (36.8 C)  TempSrc:  Oral Oral Oral  SpO2: 90% 97% 98% 98%  Weight:      Height:        Intake/Output Summary (Last 24 hours) at 11/16/2017 1203 Last data filed at 11/16/2017 0900 Gross per 24 hour  Intake 0 ml  Output -  Net 0 ml   Filed Weights   11/14/17 1950  Weight: (!) 140.6 kg (310 lb)    Examination:  General exam: Appears calm and comfortable  Respiratory system: Clear to auscultation. Respiratory effort normal. Cardiovascular system: S1 & S2 heard, RRR. No JVD, murmurs, rubs, gallops or clicks. No pedal edema. Gastrointestinal system: Abdomen is nondistended, soft and nontender. No organomegaly or masses felt. Normal bowel sounds heard. Central  nervous system: Alert and oriented.  Decreased sensation to touch of the right lower extremity.  Decreased range of motion of the right lower  extremity. Extremities: Symmetric 5 x 5 power. Skin: No rashes, lesions or ulcers Psychiatry: Judgement and insight appear normal. Mood & affect appropriate.     Data Reviewed:   CBC: Recent Labs  Lab 11/14/17 2156 11/15/17 0409  WBC 8.3 8.4  NEUTROABS 4.5  --   HGB 12.1 11.7*  HCT 38.0 35.9*  MCV 90.7 90.2  PLT 338 333   Basic Metabolic Panel: Recent Labs  Lab 11/14/17 2156 11/15/17 0409  NA 139 138  K 4.0 4.1  CL 100* 102  CO2 29 26  GLUCOSE 92 133*  BUN 12 13  CREATININE 0.79 0.85  CALCIUM 9.3 9.0   GFR: Estimated Creatinine Clearance: 129.5 mL/min (by C-G formula based on SCr of 0.85 mg/dL). Liver Function Tests: No results for input(s): AST, ALT, ALKPHOS, BILITOT, PROT, ALBUMIN in the last 168 hours. No results for input(s): LIPASE, AMYLASE in the last 168 hours. No results for input(s): AMMONIA in the last 168 hours. Coagulation Profile: No results for input(s): INR, PROTIME in the last 168 hours. Cardiac Enzymes: No results for input(s): CKTOTAL, CKMB, CKMBINDEX, TROPONINI in the last 168 hours. BNP (last 3 results) No results for input(s): PROBNP in the last 8760 hours. HbA1C: No results for input(s): HGBA1C in the last 72 hours. CBG: No results for input(s): GLUCAP in the last 168 hours. Lipid Profile: No results for input(s): CHOL, HDL, LDLCALC, TRIG, CHOLHDL, LDLDIRECT in the last 72 hours. Thyroid Function Tests: No results for input(s): TSH, T4TOTAL, FREET4, T3FREE, THYROIDAB in the last 72 hours. Anemia Panel: No results for input(s): VITAMINB12, FOLATE, FERRITIN, TIBC, IRON, RETICCTPCT in the last 72 hours. Sepsis Labs: No results for input(s): PROCALCITON, LATICACIDVEN in the last 168 hours.  No results found for this or any previous visit (from the past 240 hour(s)).       Radiology Studies: Mr Lumbar Spine Wo Contrast  Result Date: 11/15/2017 CLINICAL DATA:  32 y/o F; sudden onset lower right-sided back pain radiating into the  right buttocks and right thigh. EXAM: MRI LUMBAR SPINE WITHOUT CONTRAST TECHNIQUE: Multiplanar, multisequence MR imaging of the lumbar spine was performed. No intravenous contrast was administered. COMPARISON:  05/01/2016 CT abdomen and pelvis. FINDINGS: Segmentation:  Standard. Alignment:  Physiologic. Vertebrae:  No fracture, evidence of discitis, or bone lesion. Conus medullaris and cauda equina: Conus extends to the L1 level. Fatty filum. Paraspinal and other soft tissues: Negative. Disc levels: L1-2: No significant disc displacement, foraminal stenosis, or canal stenosis. L2-3: No significant disc displacement, foraminal stenosis, or canal stenosis. L3-4: Small left foraminal disc protrusion with mild left foraminal stenosis. No canal stenosis. L4-5: Small right subarticular and foraminal disc protrusion which contacts the descending L5 nerve root in the right lateral recess (series 8, image 20) and results in mild right-sided foraminal stenosis. No canal stenosis. L5-S1: Small central disc protrusion. No significant foraminal or canal stenosis. IMPRESSION: 1. No acute osseous abnormality or malalignment. 2. Small disc protrusions are present at the L3-4, L4-5, and L5-S1 levels. 3. Mild left L3-4 and mild right L4-5 foraminal stenosis. No significant canal stenosis. 4. Protrusion contact on the descending right L5 nerve root in the right L4-5 lateral recess. Electronically Signed   By: Mitzi Hansen M.D.   On: 11/15/2017 01:02        Scheduled Meds: . clonazePAM  2 mg Oral BID  .  dexamethasone  4 mg Intravenous Q6H  . gabapentin  300 mg Oral TID  . hydrochlorothiazide  12.5 mg Oral Daily  . levothyroxine  50 mcg Oral QAC breakfast  . lisinopril  10 mg Oral Daily  . venlafaxine XR  150 mg Oral QHS   Continuous Infusions:   LOS: 0 days    I have spent 35 minutes face to face with the patient and on the ward discussing the patients care, assessment, plan and disposition with other  care givers. >50% of the time was devoted counseling the patient about the risks and benefits of treatment and coordinating care.     Tripton Ned Joline Maxcy, MD [facility] Pager [phone]   If 7PM-7AM, please contact night-coverage [email or web address] Password [email or web address] 11/16/2017, 12:03 PM

## 2017-11-16 NOTE — Evaluation (Signed)
Occupational Therapy Evaluation Patient Details Name: MARJORIA MANCILLAS MRN: [id number] DOB: Jan 09, 1986 Today's Date: 11/16/2017    History of Present Illness Pt is a 32 y/o female presenting with increased back pain after climbing into truck. Imaging revealed disk protrusions throughout lumbar spine with some impingement on L5. PMH includes HTN, PTSD, anxiety, and depression.    Clinical Impression   Pt admitted with back pain. Pt currently with functional limitations due to the deficits listed below (see OT Problem List).  Pt will benefit from skilled OT to increase their safety and independence with ADL and functional mobility for ADL to facilitate discharge to venue listed below.      Follow Up Recommendations  No OT follow up    Equipment Recommendations  Tub/shower bench    Recommendations for Other Services       Precautions / Restrictions Precautions Precautions: Back Precaution Booklet Issued: No Precaution Comments: Reviewed back precautions with pt to help with pain management.  Restrictions Weight Bearing Restrictions: No      Mobility Bed Mobility Overal bed mobility: Needs Assistance Bed Mobility: Rolling;Sidelying to Sit;Sit to Sidelying Rolling: Supervision Sidelying to sit: Supervision       General bed mobility comments: VC for technique  Transfers Overall transfer level: Needs assistance Equipment used: Rolling walker (2 wheeled) Transfers: Sit to/from [employer] Sit to Stand: Min guard Stand pivot transfers: Min guard            Balance Overall balance assessment: Needs assistance Sitting-balance support: No upper extremity supported;Feet supported Sitting balance-Leahy Scale: Good     Standing balance support: Bilateral upper extremity supported;During functional activity Standing balance-Leahy Scale: Poor Standing balance comment: Reliant on BUE support                            ADL either performed or  assessed with clinical judgement   ADL Overall ADL's : Needs assistance/impaired Eating/Feeding: Supervision/ safety;Sitting;Independent   Grooming: Set up;Sitting   Upper Body Bathing: Set up;Sitting   Lower Body Bathing: Moderate assistance;Sit to/from stand;Cueing for sequencing;Cueing for safety;With adaptive equipment   Upper Body Dressing : Set up;Sitting   Lower Body Dressing: Moderate assistance;Sit to/from stand;Cueing for sequencing;Cueing for safety       Toileting- Clothing Manipulation and Hygiene: Minimal assistance;Sit to/from stand;Cueing for sequencing;Cueing for safety;With adaptive equipment         General ADL Comments: AE issued as pt medicaid potential.  Pt very thankful .  Education provided regarding ADL activity and AE.      Vision Patient Visual Report: No change from baseline              Pertinent Vitals/Pain Pain Assessment: 0-10 Pain Score: 8  Pain Location: back RLquadrant radiating into R buttock and thigh.   Pain Descriptors / Indicators: Discomfort;Sore;Numbness;Radiating Pain Intervention(s): Monitored during session;Repositioned     Hand Dominance     Extremity/Trunk Assessment Upper Extremity Assessment Upper Extremity Assessment: Generalized weakness           Communication Communication Communication: No difficulties   Cognition Arousal/Alertness: Awake/alert Behavior During Therapy: WFL for tasks assessed/performed Overall Cognitive Status: Within Functional Limits for tasks assessed                                                Home  Living Family/patient expects to be discharged to:: Private residence Living Arrangements: Spouse/significant other;Children Available Help at Discharge: Family;Available PRN/intermittently Type of Home: House Home Access: Ramped entrance     Home Layout: One level     Bathroom Shower/Tub: Chief Strategy Officer: Standard     Home Equipment:  None          Prior Functioning/Environment Level of Independence: Independent        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         OT Problem List: Decreased strength;Decreased activity tolerance;Pain;Obesity      OT Treatment/Interventions: Self-care/ADL training;Patient/family education;DME and/or AE instruction;Energy conservation    OT Goals(Current goals can be found in the care plan section) Acute Rehab OT Goals Patient Stated Goal: to decrease pain  OT Goal Formulation: With patient Time For Goal Achievement: 11/23/17 ADL Goals Pt Will Perform Lower Body Bathing: with modified independence;sit to/from stand;with adaptive equipment Pt Will Perform Lower Body Dressing: with adaptive equipment;sit to/from stand;with modified independence Pt Will Transfer to Toilet: with modified independence;ambulating Pt Will Perform Toileting - Clothing Manipulation and hygiene: with modified independence;sit to/from stand;with adaptive equipment Pt Will Perform Tub/Shower Transfer: Tub transfer;tub bench;with supervision  OT Frequency: Min 2X/week              AM-PAC PT "6 Clicks" Daily Activity     Outcome Measure Help from another person eating meals?: None Help from another person taking care of personal grooming?: A Little Help from another person toileting, which includes using toliet, bedpan, or urinal?: A Little Help from another person bathing (including washing, rinsing, drying)?: A Lot Help from another person to put on and taking off regular upper body clothing?: A Little Help from another person to put on and taking off regular lower body clothing?: A Lot 6 Click Score: 17   End of Session Nurse Communication: Mobility status  Activity Tolerance: Patient tolerated treatment well Patient left: in chair  OT Visit Diagnosis: Unsteadiness on feet (R26.81);Muscle weakness (generalized) (M62.81)                Time: 4098-1191 OT Time Calculation (min): 30  min Charges:  OT General Charges $OT Visit: 1 Visit OT Evaluation $OT Eval Moderate Complexity: 1 Mod OT Treatments $Self Care/Home Management : 8-22 mins G-Codes:     Lise Auer, OT [phone]  Einar Crow D 11/16/2017, 12:46 PM

## 2017-11-16 NOTE — Progress Notes (Signed)
Physical Therapy Treatment Patient Details Name: Sonya Gibson MRN: [id number] DOB: June 01, 1986 Today's Date: 11/16/2017    History of Present Illness Pt is a 32 y/o female presenting with increased back pain after climbing into truck. Imaging revealed disk protrusions throughout lumbar spine with some impingement on L5. PMH includes HTN, PTSD, anxiety, and depression.     PT Comments    Pt performed gait training and able to progress to 40 ft of gait tolerance.  Pt required cues for upper trunk control and progression.  Pt reports increased pain with movement and became tearful during session.  Returned patient to bed and positioned for comfort to reduce stress on spine.  Will continue to rec HHPT at this time.      Follow Up Recommendations  Home health PT;Supervision for mobility/OOB     Equipment Recommendations  Other (comment);3in1 (PT)(bariatric RW, bari 3:1 and bari tub bench)    Recommendations for Other Services       Precautions / Restrictions Precautions Precautions: Back Precaution Booklet Issued: No Precaution Comments: Reviewed back precautions with pt to help with pain management.  Restrictions Weight Bearing Restrictions: No    Mobility  Bed Mobility Overal bed mobility: Needs Assistance Bed Mobility: Sit to Supine(via helicopter method, patient refused log rolling despite education of safety to maintain spinal precautions.) Rolling: Supervision Sidelying to sit: Supervision   Sit to supine: Min assist   General bed mobility comments: Min assistance to lift RLE into bed.  Pt able to scoot in supine unassisted.    Transfers Overall transfer level: Needs assistance Equipment used: Rolling walker (2 wheeled) Transfers: Sit to/from Stand Sit to Stand: Min guard Stand pivot transfers: Min guard       General transfer comment: Min guard for safety.  Pt pushing down through walker to achieve standing from elevated surface.  Cues to reach back for bed when  returning to seated surface.    Ambulation/Gait Ambulation/Gait assistance: Min guard Ambulation Distance (Feet): 40 Feet Assistive device: Rolling walker (2 wheeled) Gait Pattern/deviations: Step-through pattern;Antalgic;Trunk flexed;Decreased stride length     General Gait Details: Pt with flexed posture.  Reports she is concerned for her Feet as she cannot feel her R foot so she wants to watch her steps.  Pt required 4-5 standing rest breaks in which she became tearful.  Pt required cues for progression of gait training.     Stairs             Wheelchair Mobility    Modified Rankin (Stroke Patients Only)       Balance Overall balance assessment: Needs assistance Sitting-balance support: No upper extremity supported;Feet supported Sitting balance-Leahy Scale: Fair     Standing balance support: Bilateral upper extremity supported;During functional activity Standing balance-Leahy Scale: Poor Standing balance comment: Reliant on BUE support                             Cognition Arousal/Alertness: Awake/alert Behavior During Therapy: WFL for tasks assessed/performed Overall Cognitive Status: Within Functional Limits for tasks assessed                                        Exercises      General Comments        Pertinent Vitals/Pain Pain Assessment: 0-10 Pain Score: 8  Pain Location: back RLquadrant radiating into R  buttock and thigh.   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 : Unable Difficulty sitting down on and standing up from a chair with arms (e.g., wheelchair, bedside commode, etc,.)?: Unable Help needed moving to and from a bed to chair (including a wheelchair)?: A Little Help needed walking in hospital room?: A Little Help needed climbing 3-5 steps with a railing? : A Lot 6 Click Score: 11    End of Session Equipment Utilized During Treatment: Gait belt Activity Tolerance: Patient limited by pain Patient left: in bed;with call bell/phone within reach;with family/visitor present Nurse Communication: Mobility status PT Visit Diagnosis: Difficulty in walking, not elsewhere classified (R26.2);Pain Pain - part of body: (pain)     Time: 1610-9604 PT Time Calculation (min) (ACUTE ONLY): 23 min  Charges:  $Gait Training: 8-22 mins $Therapeutic Activity: 8-22 mins                    G Codes:     Sonya Gibson, PTA pager [phone]  Sonya Gibson 11/16/2017, 12:50 PM

## 2017-11-16 NOTE — Progress Notes (Signed)
Spoke with Fleet Contras RN about bringing patient down for her epi injection tomorrow (5/31) as we don't have a spine doctor available today.

## 2017-11-17 ENCOUNTER — Inpatient Hospital Stay (HOSPITAL_COMMUNITY): Payer: Self-pay

## 2017-11-17 ENCOUNTER — Encounter (HOSPITAL_COMMUNITY): Payer: Self-pay | Admitting: Radiology

## 2017-11-17 DIAGNOSIS — R29898 Other symptoms and signs involving the musculoskeletal system: Secondary | ICD-10-CM | POA: Diagnosis present

## 2017-11-17 HISTORY — PX: IR EPIDUROGRAPHY: IMG2365

## 2017-11-17 MED ORDER — LIDOCAINE HCL (PF) 2 % IJ SOLN
INTRAMUSCULAR | Status: AC
  Filled 2017-11-17: qty 10

## 2017-11-17 MED ORDER — STERILE WATER FOR INJECTION IJ SOLN
INTRAMUSCULAR | Status: AC
  Filled 2017-11-17: qty 10

## 2017-11-17 MED ORDER — METHYLPREDNISOLONE ACETATE 80 MG/ML IJ SUSP
INTRAMUSCULAR | Status: AC
  Filled 2017-11-17: qty 1

## 2017-11-17 MED ORDER — IOPAMIDOL (ISOVUE-M 200) INJECTION 41%
INTRAMUSCULAR | Status: AC
  Administered 2017-11-17: 3 mL
  Filled 2017-11-17: qty 10

## 2017-11-17 MED ORDER — METHYLPREDNISOLONE ACETATE 40 MG/ML IJ SUSP
INTRAMUSCULAR | Status: AC
  Filled 2017-11-17: qty 1

## 2017-11-17 MED ORDER — LIDOCAINE HCL (PF) 2 % IJ SOLN
INTRAMUSCULAR | Status: DC | PRN
  Administered 2017-11-17: 2 mL

## 2017-11-17 NOTE — Progress Notes (Signed)
Occupational Therapy Treatment Patient Details Name: Sonya Gibson MRN: [id number] DOB: Jan 08, 1986 Today's Date: 11/17/2017    History of present illness Pt is a 32 y/o female presenting with increased back pain after climbing into truck. Imaging revealed disk protrusions throughout lumbar spine with some impingement on L5. PMH includes HTN, PTSD, anxiety, and depression.    OT comments  Pt anxious about procedure this day  Follow Up Recommendations  No OT follow up          Precautions / Restrictions Precautions Precautions: Back Precaution Booklet Issued: No Precaution Comments: OT reviewed back precautions and log roll technique with pt during session as well as back precautions with ADL activity  Restrictions Weight Bearing Restrictions: No       Mobility Bed Mobility Overal bed mobility: Needs Assistance Bed Mobility: Sit to Sidelying;Rolling Rolling: Min assist   Supine to sit: Min guard   Sit to sidelying: Mod assist General bed mobility comments: attempted to cue pt on log roll technique, however, pt only able to perform supine to sit with HOB elevated  Transfers Overall transfer level: Needs assistance Equipment used: Rolling walker (2 wheeled) Transfers: Sit to/from [employer] Sit to Stand: Min guard Stand pivot transfers: Min guard       General transfer comment: pt performed from EOB x1 and from recliner chair x1    Balance Overall balance assessment: Needs assistance Sitting-balance support: No upper extremity supported;Feet supported Sitting balance-Leahy Scale: Fair     Standing balance support: Bilateral upper extremity supported;During functional activity Standing balance-Leahy Scale: Poor                             ADL either performed or assessed with clinical judgement   ADL                       Lower Body Dressing: Minimal assistance;Sit to/from stand;Cueing for sequencing;Cueing for safety;With  adaptive equipment   Toilet Transfer: Min guard;Comfort height toilet;RW   Toileting- Architect and Hygiene: Minimal assistance;Sit to/from stand;Cueing for sequencing;Cueing for safety;With adaptive equipment                         Cognition Arousal/Alertness: Awake/alert Behavior During Therapy: WFL for tasks assessed/performed Overall Cognitive Status: Within Functional Limits for tasks assessed                                                     Pertinent Vitals/ Pain       Pain Assessment: Faces Pain Score: 4  Faces Pain Scale: Hurts even more Pain Location: lower back Pain Descriptors / Indicators: Discomfort;Sore;Numbness;Radiating Pain Intervention(s): Limited activity within patient's tolerance;Repositioned   Progress Toward Goals  OT Goals(current goals can now be found in the care plan section)  Progress towards OT goals: Progressing toward goals     Plan Discharge plan remains appropriate          End of Session Equipment Utilized During Treatment: Rolling walker  OT Visit Diagnosis: Unsteadiness on feet (R26.81);Pain;Other abnormalities of gait and mobility (R26.89);History of falling (Z91.81)   Activity Tolerance Patient tolerated treatment well   Patient Left in chair   Nurse Communication Mobility status  Time: 8119-1478 OT Time Calculation (min): 21 min  Charges: OT General Charges $OT Visit: 1 Visit OT Treatments $Self Care/Home Management : 8-22 mins  [place], [place] [phone]   [name] 11/17/2017, 12:50 PM

## 2017-11-17 NOTE — Progress Notes (Signed)
Subjective: Patient reports continued severe right leg pain with numbness in the leg. She states she fell yesterday when she was getting off the toilet and her "leg went numb."  Objective: Vital signs in last 24 hours: Temp:  [97.7 F (36.5 C)-98.6 F (37 C)] 98.6 F (37 C) (05/31 0515) Pulse Rate:  [121] 121 (05/31 0515) BP: (92-122)/(56-73) 92/56 (05/31 0515) SpO2:  [96 %] 96 % (05/31 0515)  Intake/Output from previous day: 05/30 0701 - 05/31 0700 In: 720 [P.O.:720] Out: -  Intake/Output this shift: No intake/output data recorded.  Neurologic: Grossly normal the exam is somewhat limited on the right secondary to her pain.  Lab Results: Lab Results  Component Value Date   WBC 8.4 11/15/2017   HGB 11.7 (L) 11/15/2017   HCT 35.9 (L) 11/15/2017   MCV 90.2 11/15/2017   PLT 333 11/15/2017   No results found for: INR, PROTIME BMET Lab Results  Component Value Date   NA 138 11/15/2017   K 4.1 11/15/2017   CL 102 11/15/2017   CO2 26 11/15/2017   GLUCOSE 133 (H) 11/15/2017   BUN 13 11/15/2017   CREATININE 0.85 11/15/2017   CALCIUM 9.0 11/15/2017    Studies/Results: No results found.  Assessment/Plan: Small focal right L4-5 HNP with right L5 radiculopathy. Pain remained severe. Continue medical management and await trial of epidural steroid injection  Estimated body mass index is 56.7 kg/m as calculated from the following:   Height as of this encounter:  (1.575 m).   Weight as of this encounter: 140.6 kg (310 lb).    LOS: 0 days    Azucena Dart S 11/17/2017, 10:10 AM   '

## 2017-11-17 NOTE — Progress Notes (Signed)
PROGRESS NOTE    Sonya Gibson  ZOX:[id number] DOB: 1986/05/11 DOA: 11/14/2017 PCP: Rhodia Albright, NP   Brief Narrative:  32 year old with history of essential hypertension, GERD, PTSD, hypothyroidism, depression, anxiety came to the hospital with complains of lower back pain which started 2 days prior to her admission.  Apparently she was trying to get into her husband's truck when she started experiencing sudden lower back pain.  In the ER patient had an MRI of the back done which showed small disc protrusion in the lumbar area with foraminal stenosis, protrusion contact with a right L5 nerve root.  She was started on steroids and conservative management but the following day her weakness slightly worsened therefore I consulted neurosurgery.  Neurosurgery recommended getting epidural steroid injection otherwise no acute surgical indication at this time.  Currently awaiting epidural injection.   Assessment & Plan:   Principal Problem:   Lower back pain Active Problems:   Hypothyroidism   Anxiety   Essential hypertension   PTSD (post-traumatic stress disorder)  Acute low back pain secondary to lumbar radiculopathy, stable Small right-sided acute disc herniation - Patient continues to ask for IV pain medications.  Currently she is on Neurontin, NSAIDs and other analgesic.  Her narcotics have been changed from oral Percocet to oxycodone intermediate release 10 mg every 4-6 hours as needed.  Her physical exam findings somewhat appears to be more pronounced than the radiologic findings on the MRI. -Continue Decadron 4 mg every 6 hours.  Can slowly be tapered over the next few days -Appreciate neurosurgery input -Plans for epidural steroidal injection today -Provide supportive care -Physical therapy recommends home PT.  History of hypothyroidism -Continue Synthroid  History of anxiety, PTSD and depression - Continue medication  Essential hypertension -Stable  Morbid obesity  with BMI greater than 85 -Advised to follow-up outpatient with bariatric surgery for evaluation.  Counseled on weight loss, diet and exercise  DVT prophylaxis: Lovenox Code Status: Full code Family Communication: Husband at bedside Disposition Plan: Discharge next 24 hours.  Consultants:   Neurosurgery  Procedures:   None  Antimicrobials:   None   Subjective: Patient still reports of pain and numbness in the right lower extremity.  Apparently she had a fall in the bathroom last night and while she was sitting on the floor she asked the nurses to give her her IV pain medications and Klonopin per the nursing staff.  Review of Systems Otherwise negative except as per HPI, including: General = no fevers, chills, dizziness, malaise, fatigue HEENT/EYES = negative for pain, redness, loss of vision, double vision, blurred vision, loss of hearing, sore throat, hoarseness, dysphagia Cardiovascular= negative for chest pain, palpitation, murmurs, lower extremity swelling Respiratory/lungs= negative for shortness of breath, cough, hemoptysis, wheezing, mucus production Gastrointestinal= negative for nausea, vomiting,, abdominal pain, melena, hematemesis Genitourinary= negative for Dysuria, Hematuria, Change in Urinary Frequency MSK = right lower extremity weakness Neurology= Negative for headache, seizures Psychiatry= Negative for anxiety, depression, suicidal and homocidal ideation Allergy/Immunology= Medication/Food allergy as listed  Skin= Negative for Rash, lesions, ulcers, itching   Objective: Vitals:   11/15/17 2229 11/16/17 0524 11/16/17 2026 11/17/17 0515  BP: 112/69 116/85 122/73 (!) 92/56  Pulse: 91 96  (!) 121  Resp:      Temp: 97.7 F (36.5 C) 98.2 F (36.8 C) 97.7 F (36.5 C) 98.6 F (37 C)  TempSrc: Oral Oral Oral Oral  SpO2: 98% 98%  96%  Weight:      Height:  Intake/Output Summary (Last 24 hours) at 11/17/2017 1115 Last data filed at 11/17/2017  0700 Gross per 24 hour  Intake 720 ml  Output -  Net 720 ml   Filed Weights   11/14/17 1950  Weight: (!) 140.6 kg (310 lb)    Examination: Constitutional: NAD, calm, comfortable, morbid obesity Eyes: PERRL, lids and conjunctivae normal ENMT: Mucous membranes are moist. Posterior pharynx clear of any exudate or lesions.Normal dentition.  Neck: normal, supple, no masses, no thyromegaly Respiratory: clear to auscultation bilaterally, no wheezing, no crackles. Normal respiratory effort. No accessory muscle use.  Cardiovascular: Regular rate and rhythm, no murmurs / rubs / gallops. No extremity edema. 2+ pedal pulses. No carotid bruits.  Abdomen: no tenderness, no masses palpated. No hepatosplenomegaly. Bowel sounds positive.  Musculoskeletal: Decreased range of motion of her left lower extremity Skin: no rashes, lesions, ulcers. No induration Neurologic: CN 2-12 grossly intact.  Sensation to touch and pain diminished in the right lower extremity, strength in right lower extremity is 3/5, rest of the extremity 5/5. Psychiatric: Normal judgment and insight. Alert and oriented x 3. Normal mood.    Data Reviewed:   CBC: Recent Labs  Lab 11/14/17 2156 11/15/17 0409  WBC 8.3 8.4  NEUTROABS 4.5  --   HGB 12.1 11.7*  HCT 38.0 35.9*  MCV 90.7 90.2  PLT 338 333   Basic Metabolic Panel: Recent Labs  Lab 11/14/17 2156 11/15/17 0409  NA 139 138  K 4.0 4.1  CL 100* 102  CO2 29 26  GLUCOSE 92 133*  BUN 12 13  CREATININE 0.79 0.85  CALCIUM 9.3 9.0   GFR: Estimated Creatinine Clearance: 129.5 mL/min (by C-G formula based on SCr of 0.85 mg/dL). Liver Function Tests: No results for input(s): AST, ALT, ALKPHOS, BILITOT, PROT, ALBUMIN in the last 168 hours. No results for input(s): LIPASE, AMYLASE in the last 168 hours. No results for input(s): AMMONIA in the last 168 hours. Coagulation Profile: No results for input(s): INR, PROTIME in the last 168 hours. Cardiac Enzymes: No  results for input(s): CKTOTAL, CKMB, CKMBINDEX, TROPONINI in the last 168 hours. BNP (last 3 results) No results for input(s): PROBNP in the last 8760 hours. HbA1C: No results for input(s): HGBA1C in the last 72 hours. CBG: No results for input(s): GLUCAP in the last 168 hours. Lipid Profile: No results for input(s): CHOL, HDL, LDLCALC, TRIG, CHOLHDL, LDLDIRECT in the last 72 hours. Thyroid Function Tests: No results for input(s): TSH, T4TOTAL, FREET4, T3FREE, THYROIDAB in the last 72 hours. Anemia Panel: No results for input(s): VITAMINB12, FOLATE, FERRITIN, TIBC, IRON, RETICCTPCT in the last 72 hours. Sepsis Labs: No results for input(s): PROCALCITON, LATICACIDVEN in the last 168 hours.  No results found for this or any previous visit (from the past 240 hour(s)).       Radiology Studies: No results found.      Scheduled Meds: . clonazePAM  2 mg Oral BID  . gabapentin  300 mg Oral TID  . hydrochlorothiazide  12.5 mg Oral Daily  . levothyroxine  50 mcg Oral QAC breakfast  . lisinopril  10 mg Oral Daily  . venlafaxine XR  150 mg Oral QHS   Continuous Infusions:   LOS: 0 days    I have spent 25 minutes face to face with the patient and on the ward discussing the patients care, assessment, plan and disposition with other care givers. >50% of the time was devoted counseling the patient about the risks and benefits of treatment  and coordinating care.     Avarey Yaeger Joline Maxcy, MD [facility] Pager [phone]   If 7PM-7AM, please contact night-coverage [email or web address] Password [email or web address] 11/17/2017, 11:15 AM

## 2017-11-17 NOTE — Care Management Note (Signed)
Case Management Note  Patient Details  Name: Rusty AusHolly E Ganesh MRN: [id number] Date of Birth: 04/02/1986  Subjective/Objective:   32 yr old female admitted with lower back pain has Small focal right L4-5 HNP with right L5 radiculopathy        Action/Plan: Patient will need therapy at discharge, she is uninsured. Case manager called referral to Shaune LeeksJermaine Jenkins, [facility] Liaison. DME has been ordered, she will have family support at discharge.    Expected Discharge Date:    11/17/17              Expected Discharge Plan:  Home w Home Health Services  In-House Referral:     Discharge planning Services  CM Consult  Post Acute Care Choice:  Home Health, Durable Medical Equipment Choice offered to:  NA  DME Arranged:  3-N-1, Walker wide DME Agency:  [facility]  [facility]  [facility]  [facility]  Status of Service:  Completed, signed off  If discussed at Long Length of Stay Meetings, dates discussed:    Additional Comments:  Durenda GuthrieBrady, Shatima Zalar Naomi, RN 11/17/2017, 12:49 PM

## 2017-11-17 NOTE — Progress Notes (Signed)
Patient ID: Sonya Gibson, female   DOB: February 13, 1986, 32 y.o.   MRN: [id number]  Right L4-5 Epidural performed with fluoroscopic guidance.   Depo-medrol & 1.0 ml 1% lidocaine injected into the epidural space on the right @ L4-5.  The patient experience concordant symptoms.  No complications.  Patient returned to floor in stable neuorlogic condition.

## 2017-11-17 NOTE — Progress Notes (Signed)
Physical Therapy Treatment Patient Details Name: Sonya Gibson MRN: [id number] DOB: 05/27/1986 Today's Date: 11/17/2017    History of Present Illness Pt is a 32 y/o female presenting with increased back pain after climbing into truck. Imaging revealed disk protrusions throughout lumbar spine with some impingement on L5. PMH includes HTN, PTSD, anxiety, and depression.     PT Comments    Pt making steady progress with functional mobility and tolerated ambulating increased distance this session. She remains limited secondary to low back pain. Pt would continue to benefit from skilled physical therapy services at this time while admitted and after d/c to address the below listed limitations in order to improve overall safety and independence with functional mobility.   Follow Up Recommendations  Home health PT;Supervision for mobility/OOB     Equipment Recommendations  Other (comment);3in1 (PT)(bari RW, bari 3-in-1 and bari tub bench)    Recommendations for Other Services       Precautions / Restrictions Precautions Precautions: Back Precaution Booklet Issued: No Precaution Comments: PT reviewed back precautions and log roll technique with pt during session Restrictions Weight Bearing Restrictions: No    Mobility  Bed Mobility Overal bed mobility: Needs Assistance Bed Mobility: Supine to Sit     Supine to sit: Min guard     General bed mobility comments: attempted to cue pt on log roll technique, however, pt only able to perform supine to sit with HOB elevated  Transfers Overall transfer level: Needs assistance Equipment used: Rolling walker (2 wheeled) Transfers: Sit to/from Stand Sit to Stand: Min guard         General transfer comment: pt performed from EOB x1 and from recliner chair x1  Ambulation/Gait Ambulation/Gait assistance: Min guard Ambulation Distance (Feet): 100 Feet(100' x2 with sitting rest break of several mins) Assistive device: Rolling walker (2  wheeled) Gait Pattern/deviations: Step-through pattern;Decreased stride length;Antalgic Gait velocity: decreased Gait velocity interpretation: <1.31 ft/sec, indicative of household ambulator General Gait Details: pt with very slow, steady gait pattern with RW; required one sitting rest break of several minutes secondary to pain and fatigue   Stairs             Wheelchair Mobility    Modified Rankin (Stroke Patients Only)       Balance Overall balance assessment: Needs assistance Sitting-balance support: No upper extremity supported;Feet supported Sitting balance-Leahy Scale: Fair     Standing balance support: Bilateral upper extremity supported;During functional activity Standing balance-Leahy Scale: Poor                              Cognition Arousal/Alertness: Awake/alert Behavior During Therapy: WFL for tasks assessed/performed Overall Cognitive Status: Within Functional Limits for tasks assessed                                        Exercises      General Comments        Pertinent Vitals/Pain Pain Assessment: Faces Faces Pain Scale: Hurts even more Pain Location: lower back Pain Descriptors / Indicators: Discomfort;Sore;Numbness;Radiating Pain Intervention(s): Monitored during session;Repositioned    Home Living                      Prior Function            PT Goals (current goals can now be found in the  care plan section) Acute Rehab PT Goals PT Goal Formulation: With patient Time For Goal Achievement: 11/29/17 Potential to Achieve Goals: Good Progress towards PT goals: Progressing toward goals    Frequency    Min 3X/week      PT Plan Current plan remains appropriate    Co-evaluation              AM-PAC PT "6 Clicks" Daily Activity  Outcome Measure  Difficulty turning over in bed (including adjusting bedclothes, sheets and blankets)?: A Little Difficulty moving from lying on back to  sitting on the side of the bed? : A Little Difficulty sitting down on and standing up from a chair with arms (e.g., wheelchair, bedside commode, etc,.)?: Unable Help needed moving to and from a bed to chair (including a wheelchair)?: A Little Help needed walking in hospital room?: A Little Help needed climbing 3-5 steps with a railing? : A Lot 6 Click Score: 15    End of Session   Activity Tolerance: Patient limited by pain Patient left: in bed;with call bell/phone within reach;with family/visitor present Nurse Communication: Mobility status PT Visit Diagnosis: Difficulty in walking, not elsewhere classified (R26.2);Pain Pain - part of body: (back)     Time: 1610-9604 PT Time Calculation (min) (ACUTE ONLY): 34 min  Charges:  $Gait Training: 8-22 mins $Therapeutic Activity: 8-22 mins                    G Codes:       [place], [place], [place] [phone]    Alessandra Bevels Cornel Werber 11/17/2017, 12:14 PM

## 2017-11-18 MED ORDER — BISACODYL 10 MG RE SUPP
10.0000 mg | Freq: Every day | RECTAL | Status: DC | PRN
  Administered 2017-11-18 – 2017-11-19 (×2): 10 mg via RECTAL
  Filled 2017-11-18 (×2): qty 1

## 2017-11-18 MED ORDER — KETOROLAC TROMETHAMINE 30 MG/ML IJ SOLN
30.0000 mg | Freq: Three times a day (TID) | INTRAMUSCULAR | Status: DC | PRN
  Administered 2017-11-18 – 2017-11-20 (×4): 30 mg via INTRAVENOUS
  Filled 2017-11-18 (×4): qty 1

## 2017-11-18 MED ORDER — GABAPENTIN 300 MG PO CAPS
600.0000 mg | ORAL_CAPSULE | Freq: Three times a day (TID) | ORAL | Status: DC
  Administered 2017-11-18 – 2017-11-21 (×9): 600 mg via ORAL
  Filled 2017-11-18 (×3): qty 2
  Filled 2017-11-18: qty 6
  Filled 2017-11-18 (×5): qty 2

## 2017-11-18 NOTE — Progress Notes (Signed)
PROGRESS NOTE    Sonya Gibson  NFA:[id number] DOB: 1985/11/07 DOA: 11/14/2017 PCP: Rhodia Albright, NP   Brief Narrative:  32 year old with history of essential hypertension, GERD, PTSD, hypothyroidism, depression, anxiety came to the hospital with complains of lower back pain which started 2 days prior to her admission.  Apparently she was trying to get into her husband's truck when she started experiencing sudden lower back pain.  In the ER patient had an MRI of the back done which showed small disc protrusion in the lumbar area with foraminal stenosis, protrusion contact with a right L5 nerve root.  She was started on steroids and conservative management but the following day her weakness slightly worsened, and neurosurgery saw patient and recommended epidural steroid injection which was done on 11/17/2017 by IR.  Assessment & Plan:   Principal Problem:   Lower back pain Active Problems:   Hypothyroidism   Anxiety   Essential hypertension   PTSD (post-traumatic stress disorder)   Weakness of right lower extremity  Acute low back pain secondary to lumbar radiculopathy, stable Small right-sided acute disc herniation - Currently she is on Neurontin, NSAIDs and narcotics, but still in pains - optimize pain control with supportive careI. -Continue Decadron 4 mg every 6 hours.  Can slowly be tapered over the next few days -Supportive Care -Physical therapy recommends home PT -NS consulting.  History of hypothyroidism -Continue Synthroid  History of anxiety, PTSD and depression - Continue medication  Essential hypertension -Stable  Morbid obesity with BMI greater than 85 -Advised to follow-up outpatient with bariatric surgery for evaluation.   Counseled on weight loss, diet and exercise  DVT prophylaxis: Lovenox Code Status: Full code Family Communication: Husband at bedside Disposition Plan: Discharge next 24 hours.  Consultants:    Neurosurgery  IR  Procedures:   None  Antimicrobials:   None   Subjective: Patient still reports of pain and numbness in the right lower extremity. No new weakness   Objective: Vitals:   11/17/17 0515 11/17/17 2050 11/18/17 0408 11/18/17 0955  BP: (!) 92/56 118/79 112/65 111/65  Pulse: (!) 121 83 (!) 101   Resp:  18 18   Temp: 98.6 F (37 C) 97.8 F (36.6 C) 97.7 F (36.5 C)   TempSrc: Oral Oral Oral   SpO2: 96% 98% 94%   Weight:      Height:       No intake or output data in the 24 hours ending 11/18/17 1347 Filed Weights   11/14/17 1950  Weight: (!) 140.6 kg (310 lb)    Examination: Constitutional: NAD Eyes: PERRL, lids and conjunctivae normal ENMT: Mucous membranes are moist. Posterior pharynx clear of any exudate or lesions.Normal dentition.  Neck: normal, supple, no masses, no thyromegaly Respiratory: clear to auscultation bilaterally, no wheezing, no crackles. Normal respiratory effort. No accessory muscle use.  Cardiovascular: Regular rate and rhythm, no murmurs / rubs / gallops. No extremity edema. 2+ pedal pulses. No carotid bruits.  Abdomen: no tenderness, no masses palpated. No hepatosplenomegaly. Bowel sounds positive.  Musculoskeletal: Decreased range of motion of her left lower extremity Skin: no rashes, lesions, ulcers. No induration Neurologic: CN 2-12 grossly intact.  Sensation to touch and pain diminished in the right lower extremity, strength in right lower extremity is 3/5, rest of the extremity 5/5. Psychiatric: Normal judgment and insight. Alert and oriented x 3. Normal mood.    Data Reviewed:   CBC: Recent Labs  Lab 11/14/17 2156 11/15/17 0409  WBC 8.3 8.4  NEUTROABS 4.5  --   HGB 12.1 11.7*  HCT 38.0 35.9*  MCV 90.7 90.2  PLT 338 333   Basic Metabolic Panel: Recent Labs  Lab 11/14/17 2156 11/15/17 0409  NA 139 138  K 4.0 4.1  CL 100* 102  CO2 29 26  GLUCOSE 92 133*  BUN 12 13  CREATININE 0.79 0.85  CALCIUM 9.3  9.0   GFR: Estimated Creatinine Clearance: 129.5 mL/min (by C-G formula based on SCr of 0.85 mg/dL). Liver Function Tests: No results for input(s): AST, ALT, ALKPHOS, BILITOT, PROT, ALBUMIN in the last 168 hours. No results for input(s): LIPASE, AMYLASE in the last 168 hours. No results for input(s): AMMONIA in the last 168 hours. Coagulation Profile: No results for input(s): INR, PROTIME in the last 168 hours. Cardiac Enzymes: No results for input(s): CKTOTAL, CKMB, CKMBINDEX, TROPONINI in the last 168 hours. BNP (last 3 results) No results for input(s): PROBNP in the last 8760 hours. HbA1C: No results for input(s): HGBA1C in the last 72 hours. CBG: No results for input(s): GLUCAP in the last 168 hours. Lipid Profile: No results for input(s): CHOL, HDL, LDLCALC, TRIG, CHOLHDL, LDLDIRECT in the last 72 hours. Thyroid Function Tests: No results for input(s): TSH, T4TOTAL, FREET4, T3FREE, THYROIDAB in the last 72 hours. Anemia Panel: No results for input(s): VITAMINB12, FOLATE, FERRITIN, TIBC, IRON, RETICCTPCT in the last 72 hours. Sepsis Labs: No results for input(s): PROCALCITON, LATICACIDVEN in the last 168 hours.  No results found for this or any previous visit (from the past 240 hour(s)).       Radiology Studies: Ir Epidurography  Result Date: 11/17/2017 CLINICAL DATA:  Right lower extremity radiculitis. Acute annular tear and disc protrusion at L4-5. Right L4 and L5 radiculopathy. FLUOROSCOPY TIME:  Radiation Exposure Index (as provided by the fluoroscopic device): 493.92 uGy*m2 Fluoroscopy Time:  24 seconds Number of Acquired Images:  0 PROCEDURE: LUMBAR EPIDURAL INJECTION: An interlaminar approach was performed on the right at L4-5. The overlying skin was cleansed and anesthetized. A 20 gauge spinal needle was advanced using loss-of-resistance technique. Injection of 2cc of Isovue-M 200 confirmed epidural placement. There was no evidence for intravascular or intrathecal  spread of contrast. I then injected 120 mg of Depo-Medrol and 3ml of 1% lidocaine. The patient tolerated the procedure without evidence for complication. The patient was observed for 20 minutes prior to discharge in stable neurologic condition. IMPRESSION: Technically successful first interlaminar epidural steroid injection on the right at L4-5. Electronically Signed   By: Marin Robertshristopher  Mattern M.D.   On: 11/17/2017 13:49        Scheduled Meds: . clonazePAM  2 mg Oral BID  . gabapentin  300 mg Oral TID  . hydrochlorothiazide  12.5 mg Oral Daily  . levothyroxine  50 mcg Oral QAC breakfast  . lisinopril  10 mg Oral Daily  . venlafaxine XR  150 mg Oral QHS   Continuous Infusions:   LOS: 1 day    Time spent 25 minutes  Jackie PlumGeorge Osei-Bonsu, MD [facility] Pager [phone]   If 7PM-7AM, please contact night-coverage [email or web address] Password [email or web address] 11/18/2017, 1:47 PM

## 2017-11-18 NOTE — Progress Notes (Signed)
Subjective: She is sitting up in bed, continued right lumbar radicular discomfort and numbness.  Limited ambulation to commode and a short distance in the hall.  Underwent epidural steroid injection yesterday afternoon.  Gabapentin 300 mg 3 times daily.  Objective: Vital signs in last 24 hours: Vitals:   11/16/17 2026 11/17/17 0515 11/17/17 2050 11/18/17 0408  BP: 122/73 (!) 92/56 118/79 112/65  Pulse:  (!) 121 83 (!) 101  Resp:   18 18  Temp: 97.7 F (36.5 C) 98.6 F (37 C) 97.8 F (36.6 C) 97.7 F (36.5 C)  TempSrc: Oral Oral Oral Oral  SpO2:  96% 98% 94%  Weight:      Height:        Physical Exam: Moving all extremities, but right lower extremity limited by discomfort.   Studies/Results: Ir Epidurography  Result Date: 11/17/2017 CLINICAL DATA:  Right lower extremity radiculitis. Acute annular tear and disc protrusion at L4-5. Right L4 and L5 radiculopathy. FLUOROSCOPY TIME:  Radiation Exposure Index (as provided by the fluoroscopic device): 493.92 uGy*m2 Fluoroscopy Time:  24 seconds Number of Acquired Images:  0 PROCEDURE: LUMBAR EPIDURAL INJECTION: An interlaminar approach was performed on the right at L4-5. The overlying skin was cleansed and anesthetized. A 20 gauge spinal needle was advanced using loss-of-resistance technique. Injection of 2cc of Isovue-M 200 confirmed epidural placement. There was no evidence for intravascular or intrathecal spread of contrast. I then injected 120 mg of Depo-Medrol and 3ml of 1% lidocaine. The patient tolerated the procedure without evidence for complication. The patient was observed for 20 minutes prior to discharge in stable neurologic condition. IMPRESSION: Technically successful first interlaminar epidural steroid injection on the right at L4-5. Electronically Signed   By: Marin Robertshristopher  Mattern M.D.   On: 11/17/2017 13:49    Assessment/Plan: Explained to patient that the effects of both gabapentin and the epidural steroid injection have a  gradual effect over time.  We are hopeful that symptoms will begin to ease.  Dr. Yetta BarreJones to follow-up on 6/3.   Hewitt ShortsNUDELMAN,ROBERT W, MD 11/18/2017, 9:55 AM

## 2017-11-19 MED ORDER — METOCLOPRAMIDE HCL 5 MG/ML IJ SOLN
10.0000 mg | Freq: Once | INTRAMUSCULAR | Status: AC
Start: 2017-11-19 — End: 2017-11-19
  Administered 2017-11-19: 10 mg via INTRAVENOUS
  Filled 2017-11-19: qty 2

## 2017-11-19 MED ORDER — PROMETHAZINE HCL 25 MG/ML IJ SOLN
12.5000 mg | Freq: Four times a day (QID) | INTRAMUSCULAR | Status: DC | PRN
  Administered 2017-11-19 – 2017-11-20 (×4): 12.5 mg via INTRAVENOUS
  Filled 2017-11-19 (×4): qty 1

## 2017-11-19 MED ORDER — BISACODYL 10 MG RE SUPP: 10.0000 mg | Freq: Every day | RECTAL | Status: DC | PRN

## 2017-11-19 NOTE — Progress Notes (Signed)
PROGRESS NOTE    Sonya Gibson  ZOX:[id number] DOB: 10/14/1985 DOA: 11/14/2017 PCP: Rhodia Albrightampbell, Sabrina White, NP   Brief Narrative:  32 year old with history of essential hypertension, GERD, PTSD, hypothyroidism, depression, anxiety came to the hospital with complains of lower back pain which started 2 days prior to her admission.  Apparently she was trying to get into her husband's truck when she started experiencing sudden lower back pain.  In the ER patient had an MRI of the back done which showed small disc protrusion in the lumbar area with foraminal stenosis, protrusion contact with a right L5 nerve root.  She was started on steroids and conservative management but the following day her weakness slightly worsened, and neurosurgery saw patient and recommended epidural steroid injection which was done on 11/17/2017 by IR.  Assessment & Plan:   Principal Problem:   Lower back pain Active Problems:   Hypothyroidism   Anxiety   Essential hypertension   PTSD (post-traumatic stress disorder)   Weakness of right lower extremity  Acute low back pain secondary to lumbar radiculopathy, stable Small right-sided acute disc herniation - Currently she is on Neurontin, NSAIDs and narcotics, but still in pains - optimize pain control with supportive careI. -Continue Decadron 4 mg every 6 hours.  Can slowly be tapered over the next few days -Supportive Care -PT/OT -NS consulting.  History of hypothyroidism -Continue Synthroid  History of anxiety, PTSD and depression - Continue medication  Essential hypertension -Stable  Morbid obesity with BMI greater than 85 -Advised to follow-up outpatient with bariatric surgery for evaluation.   Counseled on weight loss, diet and exercise  DVT prophylaxis: Lovenox Code Status: Full code Family Communication: Husband at bedside Disposition Plan: Discharge next 24 hours.  Consultants:   Neurosurgery  IR  Procedures:   None  Antimicrobials:    None   Subjective: Patient still reports of pain, constipation  Objective: Vitals:   11/18/17 0955 11/18/17 2013 11/19/17 0515 11/19/17 1023  BP: 111/65 (!) 91/56 104/65 104/65  Pulse:  88 89   Resp:  16 15   Temp:  98.6 F (37 C) 98.1 F (36.7 C)   TempSrc:  Oral Oral   SpO2:  97% 93%   Weight:      Height:        Intake/Output Summary (Last 24 hours) at 11/19/2017 1043 Last data filed at 11/18/2017 2058 Gross per 24 hour  Intake 720 ml  Output -  Net 720 ml   Filed Weights   11/14/17 1950  Weight: (!) 140.6 kg (310 lb)    Examination: Constitutional: NAD Eyes: PERRL, lids and conjunctivae normal ENMT: Mucous membranes are moist. Posterior pharynx clear of any exudate or lesions.Normal dentition.  Neck: normal, supple, no masses, no thyromegaly Respiratory: clear to auscultation bilaterally, no wheezing, no crackles. Normal respiratory effort. No accessory muscle use.  Cardiovascular: Regular rate and rhythm, no murmurs / rubs / gallops. No extremity edema. 2+ pedal pulses. No carotid bruits.  Abdomen: no tenderness, no masses palpated. No hepatosplenomegaly. Bowel sounds positive.  Musculoskeletal: Decreased range of motion of her left lower extremity Skin: no rashes, lesions, ulcers. No induration Neurologic: CN 2-12 grossly intact.  Sensation to touch and pain diminished in the right lower extremity, strength in right lower extremity is 3/5, rest of the extremity 5/5. Psychiatric: Normal judgment and insight. Alert and oriented x 3. Normal mood.    Data Reviewed:   CBC: Recent Labs  Lab 11/14/17 2156 11/15/17 0409  WBC 8.3  8.4  NEUTROABS 4.5  --   HGB 12.1 11.7*  HCT 38.0 35.9*  MCV 90.7 90.2  PLT 338 333   Basic Metabolic Panel: Recent Labs  Lab 11/14/17 2156 11/15/17 0409  NA 139 138  K 4.0 4.1  CL 100* 102  CO2 29 26  GLUCOSE 92 133*  BUN 12 13  CREATININE 0.79 0.85  CALCIUM 9.3 9.0   GFR: Estimated Creatinine Clearance: 129.5 mL/min  (by C-G formula based on SCr of 0.85 mg/dL). Liver Function Tests: No results for input(s): AST, ALT, ALKPHOS, BILITOT, PROT, ALBUMIN in the last 168 hours. No results for input(s): LIPASE, AMYLASE in the last 168 hours. No results for input(s): AMMONIA in the last 168 hours. Coagulation Profile: No results for input(s): INR, PROTIME in the last 168 hours. Cardiac Enzymes: No results for input(s): CKTOTAL, CKMB, CKMBINDEX, TROPONINI in the last 168 hours. BNP (last 3 results) No results for input(s): PROBNP in the last 8760 hours. HbA1C: No results for input(s): HGBA1C in the last 72 hours. CBG: No results for input(s): GLUCAP in the last 168 hours. Lipid Profile: No results for input(s): CHOL, HDL, LDLCALC, TRIG, CHOLHDL, LDLDIRECT in the last 72 hours. Thyroid Function Tests: No results for input(s): TSH, T4TOTAL, FREET4, T3FREE, THYROIDAB in the last 72 hours. Anemia Panel: No results for input(s): VITAMINB12, FOLATE, FERRITIN, TIBC, IRON, RETICCTPCT in the last 72 hours. Sepsis Labs: No results for input(s): PROCALCITON, LATICACIDVEN in the last 168 hours.  No results found for this or any previous visit (from the past 240 hour(s)).       Radiology Studies: Ir Epidurography  Result Date: 11/17/2017 CLINICAL DATA:  Right lower extremity radiculitis. Acute annular tear and disc protrusion at L4-5. Right L4 and L5 radiculopathy. FLUOROSCOPY TIME:  Radiation Exposure Index (as provided by the fluoroscopic device): 493.92 uGy*m2 Fluoroscopy Time:  24 seconds Number of Acquired Images:  0 PROCEDURE: LUMBAR EPIDURAL INJECTION: An interlaminar approach was performed on the right at L4-5. The overlying skin was cleansed and anesthetized. A 20 gauge spinal needle was advanced using loss-of-resistance technique. Injection of 2cc of Isovue-M 200 confirmed epidural placement. There was no evidence for intravascular or intrathecal spread of contrast. I then injected 120 mg of Depo-Medrol and  3ml of 1% lidocaine. The patient tolerated the procedure without evidence for complication. The patient was observed for 20 minutes prior to discharge in stable neurologic condition. IMPRESSION: Technically successful first interlaminar epidural steroid injection on the right at L4-5. Electronically Signed   By: Marin Roberts M.D.   On: 11/17/2017 13:49        Scheduled Meds: . clonazePAM  2 mg Oral BID  . gabapentin  600 mg Oral TID  . hydrochlorothiazide  12.5 mg Oral Daily  . levothyroxine  50 mcg Oral QAC breakfast  . lisinopril  10 mg Oral Daily  . venlafaxine XR  150 mg Oral QHS   Continuous Infusions:   LOS: 2 days    Time spent 25 minutes  Jackie Plum, MD [facility] Pager [phone]   If 7PM-7AM, please contact night-coverage [email or web address] Password [facility] 11/19/2017, 10:43 AM

## 2017-11-19 NOTE — Progress Notes (Signed)
Patient is complaining of extreme nausea. Page sent to Dr. Bruna PotterBlount that patient is requesting phenergan since she can not have her zofran q8h and is not due until 0300. Patient is also complaining of feeling bloated. Given Dulcolax at 2101 and miralax 0020. Patient states she did have a BM type 1 at about 2300 but states she had to strain. Patient is very anxious and frustrated due to not being able to have a miralax given at 0020 and trazodone also given.

## 2017-11-20 DIAGNOSIS — K5900 Constipation, unspecified: Secondary | ICD-10-CM

## 2017-11-20 MED ORDER — DEXAMETHASONE 4 MG PO TABS
2.0000 mg | ORAL_TABLET | Freq: Two times a day (BID) | ORAL | Status: DC
  Administered 2017-11-20 – 2017-11-21 (×3): 2 mg via ORAL
  Filled 2017-11-20 (×3): qty 1

## 2017-11-20 MED ORDER — LACTULOSE 10 GM/15ML PO SOLN
20.0000 g | Freq: Three times a day (TID) | ORAL | Status: DC
  Administered 2017-11-20 – 2017-11-21 (×4): 20 g via ORAL
  Filled 2017-11-20 (×4): qty 30

## 2017-11-20 MED ORDER — ACETAMINOPHEN 325 MG PO TABS
650.0000 mg | ORAL_TABLET | Freq: Four times a day (QID) | ORAL | Status: DC | PRN
  Administered 2017-11-20: 650 mg via ORAL
  Filled 2017-11-20: qty 2

## 2017-11-20 NOTE — Plan of Care (Signed)
  Problem: Pain Managment: Goal: General experience of comfort will improve Outcome: Progressing   Problem: Safety: Goal: Ability to remain free from injury will improve Outcome: Progressing   

## 2017-11-20 NOTE — Progress Notes (Signed)
PROGRESS NOTE    Sonya Gibson  ZOX:[id number] DOB: 11/02/1985 DOA: 11/14/2017 PCP: Rhodia Albright, NP   Brief Narrative:  32 year old with history of essential hypertension, GERD, PTSD, hypothyroidism, depression, anxiety came to the hospital with complains of lower back pain which started 2 days prior to her admission.  Apparently she was trying to get into her husband's truck when she started experiencing sudden lower back pain.  In the ER patient had an MRI of the back done which showed small disc protrusion in the lumbar area with foraminal stenosis, protrusion contact with a right L5 nerve root.  She was started on steroids and conservative management but the following day her weakness slightly worsened therefore I consulted neurosurgery.  Neurosurgery recommended getting epidural steroid injection otherwise no acute surgical indication at this time.  Patient received epidural injection on 5/31.   Assessment & Plan:   Principal Problem:   Lower back pain Active Problems:   Hypothyroidism   Anxiety   Essential hypertension   PTSD (post-traumatic stress disorder)   Weakness of right lower extremity  Acute low back pain secondary to lumbar radiculopathy, stable Small right-sided acute disc herniation - Patient is currently on IV ketorolac, gabapentin and oral narcotics.  Continues to ask for IV pain medications. - We will reduce Decadron to 2 mg twice daily -Appreciate neurosurgery input -Provide supportive care -Needs outpatient bariatric surgery evaluation  Constipation -Likely secondary to narcotics.  Routine bowel regimen has not really helped her much therefore I will add lactulose.  History of hypothyroidism -Continue Synthroid  History of anxiety, PTSD and depression - Continue medication  Essential hypertension -Stable  Morbid obesity with BMI greater than 85 -Needs outpatient bariatric surgery evaluation.  DVT prophylaxis: Lovenox Code Status: Full  code Family Communication: Husband at bedside Disposition Plan: We will likely discharge patient tomorrow  Consultants:   Neurosurgery  Procedures:   None  Antimicrobials:   None   Subjective: Still reports some lower back pain.  She is asking me for IV pain medications and antianxiety medications.  Review of Systems Otherwise negative except as per HPI, including: General = no fevers, chills, dizziness, malaise, fatigue HEENT/EYES = negative for pain, redness, loss of vision, double vision, blurred vision, loss of hearing, sore throat, hoarseness, dysphagia Cardiovascular= negative for chest pain, palpitation, murmurs, lower extremity swelling Respiratory/lungs= negative for shortness of breath, cough, hemoptysis, wheezing, mucus production Gastrointestinal= negative for nausea, vomiting,, abdominal pain, melena, hematemesis Genitourinary= negative for Dysuria, Hematuria, Change in Urinary Frequency MSK = Negative for arthralgia, myalgias, Joint swelling  Neurology= Negative for headache, seizures, , tingling  Psychiatry= Negative for anxiety, depression, suicidal and homocidal ideation Allergy/Immunology= Medication/Food allergy as listed  Skin= Negative for Rash, lesions, ulcers, itching    Objective: Vitals:   11/19/17 1023 11/19/17 1643 11/19/17 2115 11/20/17 0609  BP: 104/65 117/72 109/80 117/65  Pulse:  (!) 109 (!) 105 90  Resp:   18 18  Temp:  97.8 F (36.6 C) 98 F (36.7 C)   TempSrc:  Oral Oral   SpO2:  97% 97% 98%  Weight:      Height:        Intake/Output Summary (Last 24 hours) at 11/20/2017 1202 Last data filed at 11/20/2017 0900 Gross per 24 hour  Intake 720 ml  Output -  Net 720 ml   Filed Weights   11/14/17 1950  Weight: (!) 140.6 kg (310 lb)    Examination: Constitutional: NAD, calm, comfortable Eyes: PERRL, lids and  conjunctivae normal ENMT: Mucous membranes are moist. Posterior pharynx clear of any exudate or lesions.Normal dentition.    Neck: normal, supple, no masses, no thyromegaly Respiratory: clear to auscultation bilaterally, no wheezing, no crackles. Normal respiratory effort. No accessory muscle use.  Cardiovascular: Regular rate and rhythm, no murmurs / rubs / gallops. No extremity edema. 2+ pedal pulses. No carotid bruits.  Abdomen: no tenderness, no masses palpated. No hepatosplenomegaly. Bowel sounds positive.  Musculoskeletal: no clubbing / cyanosis. No joint deformity upper and lower extremities. Good ROM, no contractures. Normal muscle tone.  Skin: no rashes, lesions, ulcers. No induration Neurologic: CN 2-12 grossly intact. Sensation intact, DTR normal.  Right lower extremity strength 4/5, rest of the extremities 5/5.  Slightly diminished sensation of the right lower extremity Psychiatric: Normal judgment and insight. Alert and oriented x 3. Normal mood.       Data Reviewed:   CBC: Recent Labs  Lab 11/14/17 2156 11/15/17 0409  WBC 8.3 8.4  NEUTROABS 4.5  --   HGB 12.1 11.7*  HCT 38.0 35.9*  MCV 90.7 90.2  PLT 338 333   Basic Metabolic Panel: Recent Labs  Lab 11/14/17 2156 11/15/17 0409  NA 139 138  K 4.0 4.1  CL 100* 102  CO2 29 26  GLUCOSE 92 133*  BUN 12 13  CREATININE 0.79 0.85  CALCIUM 9.3 9.0   GFR: Estimated Creatinine Clearance: 129.5 mL/min (by C-G formula based on SCr of 0.85 mg/dL). Liver Function Tests: No results for input(s): AST, ALT, ALKPHOS, BILITOT, PROT, ALBUMIN in the last 168 hours. No results for input(s): LIPASE, AMYLASE in the last 168 hours. No results for input(s): AMMONIA in the last 168 hours. Coagulation Profile: No results for input(s): INR, PROTIME in the last 168 hours. Cardiac Enzymes: No results for input(s): CKTOTAL, CKMB, CKMBINDEX, TROPONINI in the last 168 hours. BNP (last 3 results) No results for input(s): PROBNP in the last 8760 hours. HbA1C: No results for input(s): HGBA1C in the last 72 hours. CBG: No results for input(s): GLUCAP in  the last 168 hours. Lipid Profile: No results for input(s): CHOL, HDL, LDLCALC, TRIG, CHOLHDL, LDLDIRECT in the last 72 hours. Thyroid Function Tests: No results for input(s): TSH, T4TOTAL, FREET4, T3FREE, THYROIDAB in the last 72 hours. Anemia Panel: No results for input(s): VITAMINB12, FOLATE, FERRITIN, TIBC, IRON, RETICCTPCT in the last 72 hours. Sepsis Labs: No results for input(s): PROCALCITON, LATICACIDVEN in the last 168 hours.  No results found for this or any previous visit (from the past 240 hour(s)).       Radiology Studies: No results found.      Scheduled Meds: . clonazePAM  2 mg Oral BID  . gabapentin  600 mg Oral TID  . hydrochlorothiazide  12.5 mg Oral Daily  . lactulose  20 g Oral TID  . levothyroxine  50 mcg Oral QAC breakfast  . lisinopril  10 mg Oral Daily  . venlafaxine XR  150 mg Oral QHS   Continuous Infusions:   LOS: 3 days    I have spent 20 minutes face to face with the patient and on the ward discussing the patients care, assessment, plan and disposition with other care givers. >50% of the time was devoted counseling the patient about the risks and benefits of treatment and coordinating care.     Tomorrow Dehaas Joline Maxcyhirag Angelmarie Ponzo, MD [facility] Pager [phone]   If 7PM-7AM, please contact night-coverage [email or web address] Password [email or web address] 11/20/2017, 12:02 PM

## 2017-11-20 NOTE — Progress Notes (Signed)
Occupational Therapy Treatment Patient Details Name: Sonya Gibson MRN: [id number] DOB: May 24, 1986 Today's Date: 11/20/2017    History of present illness Pt is a 32 y/o female presenting with increased back pain after climbing into truck. Imaging revealed disk protrusions throughout lumbar spine with some impingement on L5. PMH includes HTN, PTSD, anxiety, and depression.    OT comments  Pt demonstrating progress toward OT goals this session. She is able to complete LB dressing tasks with AE with overall set-up and supervision for safety. She was able to ambulate into bathroom and stand at sink for grooming tasks with one seated rest break due to pain. Pt demonstrating improving activity tolerance for ADL but continues to fatigue easily. OT will continue to follow while admitted to continue education concerning back precautions and compensatory ADL strategies. Will need to address tub transfers next session.    Follow Up Recommendations  No OT follow up    Equipment Recommendations  Tub/shower bench    Recommendations for Other Services      Precautions / Restrictions Precautions Precautions: Back Precaution Booklet Issued: No Precaution Comments: Reviewed back precautions. Pt unable to recall.  Restrictions Weight Bearing Restrictions: No       Mobility Bed Mobility Overal bed mobility: Needs Assistance Bed Mobility: Sit to Sidelying;Rolling;Sidelying to Sit Rolling: Min guard Sidelying to sit: Min guard     Sit to sidelying: Min guard General bed mobility comments: Min guard assist for safety and cues for log roll technique.   Transfers Overall transfer level: Needs assistance Equipment used: Rolling walker (2 wheeled) Transfers: Sit to/from [employer] Sit to Stand: Supervision Stand pivot transfers: Supervision       General transfer comment: Supervision from EOB and BSC.     Balance Overall balance assessment: Needs assistance Sitting-balance  support: No upper extremity supported;Feet supported Sitting balance-Leahy Scale: Fair     Standing balance support: Bilateral upper extremity supported;During functional activity Standing balance-Leahy Scale: Fair Standing balance comment: Reliant on BUE support                            ADL either performed or assessed with clinical judgement   ADL Overall ADL's : Needs assistance/impaired Eating/Feeding: Supervision/ safety;Sitting;Independent   Grooming: Supervision/safety;Standing Grooming Details (indicate cue type and reason): cues for compensatory strategies             Lower Body Dressing: Supervision/safety;Sit to/from stand;With adaptive equipment   Toilet Transfer: Supervision/safety;BSC;RW Toilet Transfer Details (indicate cue type and reason): Close supervision for safety.          Functional mobility during ADLs: Supervision/safety;Rolling walker General ADL Comments: Pt very motivated to participate. Required seated resting break at sink to maximize tolerance for grooming tasks.      Vision       Perception     Praxis      Cognition Arousal/Alertness: Awake/alert Behavior During Therapy: WFL for tasks assessed/performed Overall Cognitive Status: Within Functional Limits for tasks assessed                                          Exercises     Shoulder Instructions       General Comments Husband present and sleeping during session.     Pertinent Vitals/ Pain       Pain Assessment: Faces Faces Pain Scale:  Hurts even more Pain Location: lower back Pain Descriptors / Indicators: Discomfort;Sore;Numbness;Radiating Pain Intervention(s): Limited activity within patient's tolerance;Monitored during session;Repositioned  Home Living                                          Prior Functioning/Environment              Frequency  Min 2X/week        Progress Toward Goals  OT  Goals(current goals can now be found in the care plan section)  Progress towards OT goals: Progressing toward goals  Acute Rehab OT Goals Patient Stated Goal: to decrease pain  OT Goal Formulation: With patient Time For Goal Achievement: 11/23/17  Plan Discharge plan remains appropriate    Co-evaluation                 AM-PAC PT "6 Clicks" Daily Activity     Outcome Measure   Help from another person eating meals?: None Help from another person taking care of personal grooming?: A Little Help from another person toileting, which includes using toliet, bedpan, or urinal?: A Little Help from another person bathing (including washing, rinsing, drying)?: A Lot Help from another person to put on and taking off regular upper body clothing?: A Little Help from another person to put on and taking off regular lower body clothing?: A Little 6 Click Score: 18    End of Session Equipment Utilized During Treatment: Rolling walker  OT Visit Diagnosis: Unsteadiness on feet (R26.81);Pain;Other abnormalities of gait and mobility (R26.89);History of falling (Z91.81) Pain - Right/Left: Right Pain - part of body: Leg   Activity Tolerance Patient tolerated treatment well   Patient Left in bed;with call bell/phone within reach   Nurse Communication Mobility status        Time: [id number] OT Time Calculation (min): 24 min  Charges: OT General Charges $OT Visit: 1 Visit OT Treatments $Self Care/Home Management : 23-37 mins  Doristine Sectionharity A Zaccheus Edmister, MS OTR/L  Pager: [phone]    Delsy Etzkorn A Yuan Gann 11/20/2017, 9:20 AM

## 2017-11-20 NOTE — Progress Notes (Signed)
Subjective: Patient reports some leg pain and numbness with walking and standing , better in bed  Objective: Vital signs in last 24 hours: Temp:  [97.8 F (36.6 C)-98 F (36.7 C)] 98 F (36.7 C) (06/02 2115) Pulse Rate:  [90-109] 90 (06/03 0609) Resp:  [18] 18 (06/03 0609) BP: (104-117)/(65-80) 117/65 (06/03 0609) SpO2:  [97 %-98 %] 98 % (06/03 0609)  Intake/Output from previous day: 06/02 0701 - 06/03 0700 In: 480 [P.O.:480] Out: -  Intake/Output this shift: No intake/output data recorded.  Neurologic: Grossly normal, morbidly obese  Lab Results: Lab Results  Component Value Date   WBC 8.4 11/15/2017   HGB 11.7 (L) 11/15/2017   HCT 35.9 (L) 11/15/2017   MCV 90.2 11/15/2017   PLT 333 11/15/2017   No results found for: INR, PROTIME BMET Lab Results  Component Value Date   NA 138 11/15/2017   K 4.1 11/15/2017   CL 102 11/15/2017   CO2 26 11/15/2017   GLUCOSE 133 (H) 11/15/2017   BUN 13 11/15/2017   CREATININE 0.85 11/15/2017   CALCIUM 9.0 11/15/2017    Studies/Results: No results found.  Assessment/Plan: Seems somewhat better, thought still struggling to mobilize  Estimated body mass index is 56.7 kg/m as calculated from the following:   Height as of this encounter: 5\' 2"  (1.575 m).   Weight as of this encounter: 140.6 kg (310 lb).    LOS: 3 days    Millicent Blazejewski S 11/20/2017, 8:11 AM   '

## 2017-11-20 NOTE — Progress Notes (Signed)
Physical Therapy Treatment Patient Details Name: Sonya Gibson MRN: [id number] DOB: 09/23/1985 Today's Date: 11/20/2017    History of Present Illness Pt is a 32 y/o female presenting with increased back pain after climbing into truck. Imaging revealed disk protrusions throughout lumbar spine with some impingement on L5. PMH includes HTN, PTSD, anxiety, and depression.     PT Comments    Pt progressing slowly towards physical therapy goals. Pt tearful throughout session due to pain, however there may be some psychosocial aspects to this as well. Pt crying upon PT entry and pt asked therapist to leave room while she spoke to her friend prior to session beginning. Pt demonstrated the ability to ambulate 100' with the RW, and step up backwards onto a step to allow for safe entry into her husband's truck, and recall 3/3 back precautions. Although pt is mobilizing slowly and continues to report pain, she is grossly functioning at a min guard to supervision level and feel she is safe to return home when medically ready. Will continue to follow and progress as able per POC.  Follow Up Recommendations  Home health PT;Supervision for mobility/OOB     Equipment Recommendations  Other (comment);3in1 (PT)(bari RW, bari 3-in-1 and bari tub bench)    Recommendations for Other Services       Precautions / Restrictions Precautions Precautions: Back Precaution Booklet Issued: No Precaution Comments: Reviewed precautions verbally. Pt was able to recall 3/3 precautions  however required cues during functional mobility for maintenance Restrictions Weight Bearing Restrictions: No    Mobility  Bed Mobility               General bed mobility comments: Pt sitting up on EOB when PT arrived. At end of session plan was to attempt bed mobility however pt suddenly nauseated and dry heaving, and asked to sit EOB instead.   Transfers Overall transfer level: Needs assistance Equipment used: Rolling walker  (2 wheeled) Transfers: Sit to/from [employer];Stand Pivot Transfers Sit to Stand: Supervision         General transfer comment: Supervision from EOB, however required increased time to achieve full stand. Every movement appeared very effortful.  Ambulation/Gait Ambulation/Gait assistance: Min guard Ambulation Distance (Feet): 100 Feet Assistive device: Rolling walker (2 wheeled) Gait Pattern/deviations: Step-through pattern;Decreased stride length;Antalgic Gait velocity: decreased Gait velocity interpretation: <1.31 ft/sec, indicative of household ambulator General Gait Details: Overall slow but steady with RW. Distance limited this session due to padiating pain into R glute and down RLE. Pt taking several standing rest breaks in a flexed position to relieve pain.    Stairs Stairs: Yes Stairs assistance: Min guard Stair Management: No rails;Backwards;With walker Number of Stairs: 1(x2 trials) General stair comments: Pt crying throughout however was willing to attempt the curb step x2. Focus was for education on getting into her husband's high truck with a step stool.    Wheelchair Mobility    Modified Rankin (Stroke Patients Only)       Balance Overall balance assessment: Needs assistance Sitting-balance support: No upper extremity supported;Feet supported Sitting balance-Leahy Scale: Fair     Standing balance support: Bilateral upper extremity supported;During functional activity Standing balance-Leahy Scale: Fair Standing balance comment: Reliant on BUE support                             Cognition Arousal/Alertness: Awake/alert Behavior During Therapy: Anxious(Tearful throughout) Overall Cognitive Status: Within Functional Limits for tasks assessed  Exercises      General Comments        Pertinent Vitals/Pain Pain Assessment: Faces Faces Pain Scale: Hurts whole lot Pain Location: lower back; R  glute Pain Descriptors / Indicators: Discomfort;Sore;Numbness;Radiating;Stabbing Pain Intervention(s): Limited activity within patient's tolerance;Monitored during session;Repositioned    Home Living                      Prior Function            PT Goals (current goals can now be found in the care plan section) Acute Rehab PT Goals Patient Stated Goal: to decrease pain  PT Goal Formulation: With patient Time For Goal Achievement: 11/29/17 Potential to Achieve Goals: Good Progress towards PT goals: Progressing toward goals    Frequency    Min 3X/week      PT Plan Current plan remains appropriate    Co-evaluation              AM-PAC PT "6 Clicks" Daily Activity  Outcome Measure  Difficulty turning over in bed (including adjusting bedclothes, sheets and blankets)?: A Little Difficulty moving from lying on back to sitting on the side of the bed? : A Little Difficulty sitting down on and standing up from a chair with arms (e.g., wheelchair, bedside commode, etc,.)?: Unable Help needed moving to and from a bed to chair (including a wheelchair)?: A Little Help needed walking in hospital room?: A Little Help needed climbing 3-5 steps with a railing? : A Lot 6 Click Score: 15    End of Session Equipment Utilized During Treatment: Gait belt Activity Tolerance: Patient limited by pain Patient left: in bed;with call bell/phone within reach;with family/visitor present Nurse Communication: Mobility status PT Visit Diagnosis: Difficulty in walking, not elsewhere classified (R26.2);Pain Pain - Right/Left: Right Pain - part of body: Hip;Leg(back)     Time: 1610-9604 PT Time Calculation (min) (ACUTE ONLY): 36 min  Charges:  $Gait Training: 23-37 mins                    G Codes:       Conni Slipper, PT, DPT Acute Rehabilitation Services Pager: [phone]    Marylynn Pearson 11/20/2017, 1:11 PM

## 2017-11-21 DIAGNOSIS — E039 Hypothyroidism, unspecified: Secondary | ICD-10-CM

## 2017-11-21 DIAGNOSIS — F419 Anxiety disorder, unspecified: Secondary | ICD-10-CM

## 2017-11-21 DIAGNOSIS — I10 Essential (primary) hypertension: Secondary | ICD-10-CM

## 2017-11-21 DIAGNOSIS — M5440 Lumbago with sciatica, unspecified side: Secondary | ICD-10-CM

## 2017-11-21 DIAGNOSIS — F4310 Post-traumatic stress disorder, unspecified: Secondary | ICD-10-CM

## 2017-11-21 DIAGNOSIS — R29898 Other symptoms and signs involving the musculoskeletal system: Secondary | ICD-10-CM

## 2017-11-21 MED ORDER — CYCLOBENZAPRINE HCL 10 MG PO TABS: 10.0000 mg | ORAL_TABLET | Freq: Three times a day (TID) | ORAL | 0 refills | Status: DC | PRN

## 2017-11-21 MED ORDER — GABAPENTIN 300 MG PO CAPS: 600.0000 mg | ORAL_CAPSULE | Freq: Three times a day (TID) | ORAL | 0 refills | Status: DC

## 2017-11-21 MED ORDER — METHYLPREDNISOLONE 4 MG PO TBPK: ORAL_TABLET | ORAL | 0 refills | Status: DC

## 2017-11-21 MED ORDER — PROMETHAZINE HCL 12.5 MG PO TABS: 12.5000 mg | ORAL_TABLET | Freq: Four times a day (QID) | ORAL | 0 refills | Status: DC | PRN

## 2017-11-21 MED ORDER — ONDANSETRON 8 MG PO TBDP: 8.0000 mg | ORAL_TABLET | Freq: Three times a day (TID) | ORAL | 0 refills | Status: DC | PRN

## 2017-11-21 MED ORDER — CLONAZEPAM 2 MG PO TABS: 2.0000 mg | ORAL_TABLET | Freq: Two times a day (BID) | ORAL | 0 refills | Status: DC

## 2017-11-21 MED ORDER — POLYETHYLENE GLYCOL 3350 17 G PO PACK: 17.0000 g | PACK | Freq: Every day | ORAL | 0 refills | Status: DC | PRN

## 2017-11-21 MED ORDER — OXYCODONE HCL 10 MG PO TABS: 10.0000 mg | ORAL_TABLET | ORAL | 0 refills | Status: AC | PRN

## 2017-11-21 MED ORDER — LACTULOSE 10 GM/15ML PO SOLN: 20.0000 g | Freq: Two times a day (BID) | ORAL | 0 refills | Status: DC | PRN

## 2017-11-21 NOTE — Progress Notes (Signed)
Patient states she sustained a fall in the bathroom earlier in this admission. I reminded her that she needed to call for staff assistance before getting out of bed due to fall risk.  Bed alarm turned on.  She verbalized understanding.

## 2017-11-21 NOTE — Progress Notes (Signed)
PT Cancellation Note  Patient Details Name: Rusty AusHolly E Perfetti MRN: [id number] DOB: 05/02/1986   Cancelled Treatment:    Reason Eval/Treat Not Completed: Patient declined. Attempted to see pt at 12:00pm and pt sleeping with all lights off in room. Pt declines PT today stating she has no concerns about mobilizing at home. [facility] and will "do her walking" there. Noted plan for d/c today. If pt changes her mind, please page me and I will make every effort to come back and see her.    Marylynn PearsonLaura D Tamica Covell 11/21/2017, 12:23 PM   Conni SlipperLaura Odean Fester, PT, DPT Acute Rehabilitation Services Pager: [phone]

## 2017-11-21 NOTE — Discharge Summary (Signed)
Physician Discharge Summary  Sonya Gibson ZOX:[id number] DOB: [birth year] DOA: 11/14/2017  PCP: Rhodia Albright, NP  Admit date: 11/14/2017 Discharge date: 11/21/2017  Admitted From: Home Disposition: Home  Recommendations for Outpatient Follow-up:  1. Follow up with PCP in 1-2 weeks, if none then follow with community [facility].  Information provided 2. Please obtain BMP/CBC in one week your next doctors visit.  3. Follow-up with outpatient neurosurgery - Dr Yetta Barre with [facility] in 2 weeks 4. Medrol Dosepak prescribed 5. Bowel regimen prescribed.  Oxycodone 10 mg intermediate release every 4 hours as needed for 7 days prescription given. 6. 15 doses for Klonopin refill 2 mg twice daily also given 7. Prescription for Flexeril 10 mg 3 times daily as needed for muscle spasm also given 8. Gabapentin has been increased to 600 mg 3 times daily 9. Also need to follow-up outpatient with bariatric surgery  Home Health: None Equipment/Devices: None Discharge Condition: Stable CODE STATUS: Full code Diet recommendation: Regular  Brief/Interim Summary: 32 year old with history of essential hypertension, GERD, PTSD, hypothyroidism, depression, anxiety came to the hospital with complains of lower back pain which started 2 days prior to her admission.  Apparently she was trying to get into her husband's truck when she started experiencing sudden lower back pain.  In the ER patient had an MRI of the back done which showed small disc protrusion in the lumbar area with foraminal stenosis, protrusion contact with a right L5 nerve root.  She was started on steroids and conservative management but the following day her weakness slightly worsened therefore I consulted neurosurgery.  Neurosurgery recommended getting epidural steroid injection otherwise no acute surgical indication at this time.  Patient received epidural injection on 5/31. Over the course of several days her symptoms had  slightly improved.  She is able to bear weight and ambulate with assistance.  She was evaluated physical therapy who recommended home PT with supervision. On the day of her discharge I spoke with neurosurgery team who recommended discharging patient on Medrol Dosepak and following up with them in 2 weeks. She has reached maximum benefit from an hospital stay and stable to be discharged with outpatient follow-up.    Discharge Diagnoses:  Principal Problem:   Lower back pain Active Problems:   Hypothyroidism   Anxiety   Essential hypertension   PTSD (post-traumatic stress disorder)   Weakness of right lower extremity  Acute low back pain secondary to lumbar radiculopathy, stable Small right-sided acute disc herniation -  Pain medications as listed above has been prescribed.  Her gabapentin dose has been increased as well -Status post epidural steroid injection on 5/31. - Change Decadron to Medrol Dosepak -Appreciate neurosurgery input-spoke with neurosurgery team on the day of discharge recommended Medrol Dosepak and follow-up in 2 weeks. -Provide supportive care -Needs outpatient bariatric surgery evaluation  Constipation -Bowel regimen discharge prescription given as well.  History of hypothyroidism -Continue Synthroid  History of anxiety, PTSD and depression - Continue medication  Essential hypertension -Stable  Morbid obesity with BMI greater than 85 -Needs outpatient bariatric surgery evaluation  Discharge Instructions   Allergies as of 11/21/2017      Reactions   Coconut Oil Anaphylaxis   Nitrofurantoin Hives      Medication List    STOP taking these medications   HYDROcodone-acetaminophen 5-325 MG tablet Commonly known as:  NORCO   ibuprofen 200 MG tablet Commonly known as:  ADVIL,MOTRIN   VITAMIN B-12 IJ   Vitamin D (Ergocalciferol) 50000 units  Caps capsule Commonly known as:  DRISDOL     TAKE these medications   clonazePAM 2 MG  tablet Commonly known as:  KLONOPIN Take 1 tablet (2 mg total) by mouth 2 (two) times daily for 15 doses.   cyclobenzaprine 10 MG tablet Commonly known as:  FLEXERIL Take 1 tablet (10 mg total) by mouth 3 (three) times daily as needed for muscle spasms.   gabapentin 300 MG capsule Commonly known as:  NEURONTIN Take 2 capsules (600 mg total) by mouth 3 (three) times daily. What changed:    medication strength  how much to take  when to take this  reasons to take this   lactulose 10 GM/15ML solution Commonly known as:  CHRONULAC Take 30 mLs (20 g total) by mouth 2 (two) times daily as needed for up to 15 days for moderate constipation or severe constipation.   levothyroxine 50 MCG tablet Commonly known as:  SYNTHROID, LEVOTHROID Take 50 mcg by mouth at bedtime.   Melatonin 10 MG Tabs Take 10 mg by mouth daily as needed (sleep).   methylPREDNISolone 4 MG Tbpk tablet Commonly known as:  MEDROL DOSEPAK Take 1 Medrol Dose pak as Directed   ondansetron 8 MG disintegrating tablet Commonly known as:  ZOFRAN ODT Take 1 tablet (8 mg total) by mouth every 8 (eight) hours as needed for up to 20 doses for nausea or vomiting.   Oxycodone HCl 10 MG Tabs Take 1 tablet (10 mg total) by mouth every 4 (four) hours as needed for up to 7 days for moderate pain or severe pain.   polyethylene glycol packet Commonly known as:  MIRALAX / GLYCOLAX Take 17 g by mouth daily as needed for up to 15 doses for moderate constipation.   traZODone 150 MG tablet Commonly known as:  DESYREL Take 150 mg by mouth at bedtime as needed for sleep.   venlafaxine XR 150 MG 24 hr capsule Commonly known as:  EFFEXOR-XR Take 225 mg by mouth at bedtime. 150mg  and 75mg  nightly What changed:  Another medication with the same name was removed. Continue taking this medication, and follow the directions you see here.            Durable Medical Equipment  (From admission, onward)        Start     Ordered    11/17/17 1216  For home use only DME 3 n 1  Once    Comments:  Needs bariatric   11/17/17 1216   11/17/17 1216  For home use only DME Walker rolling  Once    Comments:  Needs bariatric walker  Question:  Patient needs a walker to treat with the following condition  Answer:  Back pain   11/17/17 1216     Follow-up Information    [facility] Follow up.   Specialty:  Home Health Services Why:  A representative from [facility] will contact you to arrange start date and time for your therapy.  Contact information: [address] [place] [address] [phone]        Rhodia Albright, NP. Schedule an appointment as soon as possible for a visit in 1 week(s).   Specialty:  Internal Medicine Contact information: [address] [place] [address] [phone]        Tia Alert, MD. Schedule an appointment as soon as possible for a visit in 2 week(s).   Specialty:  Neurosurgery Contact information: [address] [place] [address] [phone]        [facility]. Schedule an appointment as soon as possible for a visit in 1 week(s).   Contact information: [address] [place] [address] [phone]         Allergies  Allergen Reactions  . Coconut Oil Anaphylaxis  . Nitrofurantoin Hives    You were cared for by a hospitalist during your hospital stay. If you have any questions about your discharge medications or the care you received while you were in the hospital after you are discharged, you can call the unit and asked to speak with the hospitalist on call if the hospitalist that took care of you is not available. Once you are discharged, your primary care physician will handle any further medical issues. Please note that no refills for any discharge medications will be authorized once you are discharged, as it is imperative that you return to  your primary care physician (or establish a relationship with a primary care physician if you do not have one) for your aftercare needs so that they can reassess your need for medications and monitor your lab values.  Consultations:  Neurosurgery  Interventional pain   Procedures/Studies: Mr Lumbar Spine Wo Contrast  Result Date: 11/15/2017 CLINICAL DATA:  32 y/o F; sudden onset lower right-sided back pain radiating into the right buttocks and right thigh. EXAM: MRI LUMBAR SPINE WITHOUT CONTRAST TECHNIQUE: Multiplanar, multisequence MR imaging of the lumbar spine was performed. No intravenous contrast was administered. COMPARISON:  05/01/2016 CT abdomen and pelvis. FINDINGS: Segmentation:  Standard. Alignment:  Physiologic. Vertebrae:  No fracture, evidence of discitis, or bone lesion. Conus medullaris and cauda equina: Conus extends to the L1 level. Fatty filum. Paraspinal and other soft tissues: Negative. Disc levels: L1-2: No significant disc displacement, foraminal stenosis, or canal stenosis. L2-3: No significant disc displacement, foraminal stenosis, or canal stenosis. L3-4: Small left foraminal disc protrusion with mild left foraminal stenosis. No canal stenosis. L4-5: Small right subarticular and foraminal disc protrusion which contacts the descending L5 nerve root in the right lateral recess (series 8, image 20) and results in mild right-sided foraminal stenosis. No canal stenosis. L5-S1: Small central disc protrusion. No significant foraminal or canal stenosis. IMPRESSION: 1. No acute osseous abnormality or malalignment. 2. Small disc protrusions are present at the L3-4, L4-5, and L5-S1 levels. 3. Mild left L3-4 and mild right L4-5 foraminal stenosis. No significant canal stenosis. 4. Protrusion contact on the descending right L5 nerve root in the right L4-5 lateral recess. Electronically Signed   By: Mitzi Hansen M.D.   On: 11/15/2017 01:02   Ir Epidurography  Result Date:  11/17/2017 CLINICAL DATA:  Right lower extremity radiculitis. Acute annular tear and disc protrusion at L4-5. Right L4 and L5 radiculopathy. FLUOROSCOPY TIME:  Radiation Exposure Index (as provided by the fluoroscopic device): 493.92 uGy*m2 Fluoroscopy Time:  24 seconds Number of Acquired Images:  0 PROCEDURE: LUMBAR EPIDURAL INJECTION: An interlaminar approach was performed on the right at L4-5. The overlying skin was cleansed and anesthetized. A 20 gauge spinal needle was advanced using loss-of-resistance technique. Injection of 2cc of Isovue-M 200 confirmed epidural placement. There was no evidence for intravascular or intrathecal spread of contrast. I then injected 120 mg of Depo-Medrol and 3ml of 1% lidocaine. The patient tolerated the procedure without evidence for complication. The patient was observed for 20 minutes prior to discharge in stable neurologic condition. IMPRESSION: Technically successful first interlaminar epidural  steroid injection on the right at L4-5. Electronically Signed   By: Marin Robertshristopher  Mattern M.D.   On: 11/17/2017 13:49      Subjective: Still reports of right lower extremity pain but improved.  She is ambulating with assistance and walker.  General = no fevers, chills, dizziness, malaise, fatigue HEENT/EYES = negative for pain, redness, loss of vision, double vision, blurred vision, loss of hearing, sore throat, hoarseness, dysphagia Cardiovascular= negative for chest pain, palpitation, murmurs, lower extremity swelling Respiratory/lungs= negative for shortness of breath, cough, hemoptysis, wheezing, mucus production Gastrointestinal= negative for nausea, vomiting,, abdominal pain, melena, hematemesis Genitourinary= negative for Dysuria, Hematuria, Change in Urinary Frequency MSK = Negative for arthralgia, myalgias, Back Pain, Joint swelling  Neurology= Negative for headache, seizures, numbness, tingling  Psychiatry= Negative for anxiety, depression, suicidal and  homocidal ideation Allergy/Immunology= Medication/Food allergy as listed  Skin= Negative for Rash, lesions, ulcers, itching   Discharge Exam: Vitals:   11/20/17 2017 11/21/17 0447  BP: 115/79 115/76  Pulse:  99  Resp: 18 16  Temp: 97.7 F (36.5 C) 98.2 F (36.8 C)  SpO2:  97%   Vitals:   11/20/17 0609 11/20/17 1444 11/20/17 2017 11/21/17 0447  BP: 117/65 109/70 115/79 115/76  Pulse: 90 91  99  Resp: 18 16 18 16   Temp:  99.7 F (37.6 C) 97.7 F (36.5 C) 98.2 F (36.8 C)  TempSrc:  Oral Oral Oral  SpO2: 98% 96%  97%  Weight:      Height:        General: Pt is alert, awake, not in acute distress Cardiovascular: RRR, S1/S2 +, no rubs, no gallops Respiratory: CTA bilaterally, no wheezing, no rhonchi Abdominal: Soft, NT, ND, bowel sounds + Extremities: no edema, no cyanosis    The results of significant diagnostics from this hospitalization (including imaging, microbiology, ancillary and laboratory) are listed below for reference.     Microbiology: No results found for this or any previous visit (from the past 240 hour(s)).   Labs: BNP (last 3 results) No results for input(s): BNP in the last 8760 hours. Basic Metabolic Panel: Recent Labs  Lab 11/14/17 2156 11/15/17 0409  NA 139 138  K 4.0 4.1  CL 100* 102  CO2 29 26  GLUCOSE 92 133*  BUN 12 13  CREATININE 0.79 0.85  CALCIUM 9.3 9.0   Liver Function Tests: No results for input(s): AST, ALT, ALKPHOS, BILITOT, PROT, ALBUMIN in the last 168 hours. No results for input(s): LIPASE, AMYLASE in the last 168 hours. No results for input(s): AMMONIA in the last 168 hours. CBC: Recent Labs  Lab 11/14/17 2156 11/15/17 0409  WBC 8.3 8.4  NEUTROABS 4.5  --   HGB 12.1 11.7*  HCT 38.0 35.9*  MCV 90.7 90.2  PLT 338 333   Cardiac Enzymes: No results for input(s): CKTOTAL, CKMB, CKMBINDEX, TROPONINI in the last 168 hours. BNP: Invalid input(s): POCBNP CBG: No results for input(s): GLUCAP in the last 168  hours. D-Dimer No results for input(s): DDIMER in the last 72 hours. Hgb A1c No results for input(s): HGBA1C in the last 72 hours. Lipid Profile No results for input(s): CHOL, HDL, LDLCALC, TRIG, CHOLHDL, LDLDIRECT in the last 72 hours. Thyroid function studies No results for input(s): TSH, T4TOTAL, T3FREE, THYROIDAB in the last 72 hours.  Invalid input(s): FREET3 Anemia work up No results for input(s): VITAMINB12, FOLATE, FERRITIN, TIBC, IRON, RETICCTPCT in the last 72 hours. Urinalysis    Component Value Date/Time   COLORURINE YELLOW 11/14/2017  2051   APPEARANCEUR HAZY (A) 11/14/2017 2051   LABSPEC 1.028 11/14/2017 2051   PHURINE 5.0 11/14/2017 2051   GLUCOSEU NEGATIVE 11/14/2017 2051   HGBUR NEGATIVE 11/14/2017 2051   BILIRUBINUR NEGATIVE 11/14/2017 2051   KETONESUR NEGATIVE 11/14/2017 2051   PROTEINUR NEGATIVE 11/14/2017 2051   UROBILINOGEN 1.0 05/04/2015 0349   NITRITE NEGATIVE 11/14/2017 2051   LEUKOCYTESUR NEGATIVE 11/14/2017 2051   Sepsis Labs Invalid input(s): PROCALCITONIN,  WBC,  LACTICIDVEN Microbiology No results found for this or any previous visit (from the past 240 hour(s)).   Time coordinating discharge:  I have spent 35 minutes face to face with the patient and on the ward discussing the patients care, assessment, plan and disposition with other care givers. >50% of the time was devoted counseling the patient about the risks and benefits of treatment/Discharge disposition and coordinating care.   SIGNED:   Dimple Nanas, MD  [facility] 11/21/2017, 9:54 AM Pager   If 7PM-7AM, please contact night-coverage [email or web address] Password [email or web address]

## 2017-11-21 NOTE — Progress Notes (Signed)
Found patient being assisted to bathroom with female visitor due to nausea.  Reminded her that she needed to call for help from staff.  Her gait was steady with walker and she needed standby assist only.  Mental status anxious but otherwise appropriate and normal.  Given Phenergan and warned her that it might cause dizziness.  She insisted on remaining in the bathroom. Female visitor in the bathroom with her.  Turned patient over to incoming nurse.

## 2017-11-27 ENCOUNTER — Other Ambulatory Visit: Payer: Self-pay

## 2017-11-27 ENCOUNTER — Ambulatory Visit: Payer: Self-pay | Attending: Family Medicine | Admitting: Physician Assistant

## 2017-11-27 ENCOUNTER — Emergency Department (HOSPITAL_COMMUNITY): Payer: Self-pay

## 2017-11-27 ENCOUNTER — Encounter (HOSPITAL_COMMUNITY): Payer: Self-pay | Admitting: Emergency Medicine

## 2017-11-27 ENCOUNTER — Emergency Department (HOSPITAL_COMMUNITY)
Admission: EM | Admit: 2017-11-27 | Discharge: 2017-11-27 | Disposition: A | Discharge status: Home / Self Care | Payer: Self-pay | Attending: Emergency Medicine | Admitting: Emergency Medicine

## 2017-11-27 VITALS — BP 115/80 | HR 105 | Temp 98.1°F | Resp 16 | Wt 323.0 lb

## 2017-11-27 DIAGNOSIS — Y999 Unspecified external cause status: Secondary | ICD-10-CM | POA: Insufficient documentation

## 2017-11-27 DIAGNOSIS — Y929 Unspecified place or not applicable: Secondary | ICD-10-CM | POA: Insufficient documentation

## 2017-11-27 DIAGNOSIS — Y9301 Activity, walking, marching and hiking: Secondary | ICD-10-CM | POA: Insufficient documentation

## 2017-11-27 DIAGNOSIS — Z9181 History of falling: Secondary | ICD-10-CM | POA: Insufficient documentation

## 2017-11-27 DIAGNOSIS — E039 Hypothyroidism, unspecified: Secondary | ICD-10-CM | POA: Insufficient documentation

## 2017-11-27 DIAGNOSIS — Z79899 Other long term (current) drug therapy: Secondary | ICD-10-CM | POA: Insufficient documentation

## 2017-11-27 DIAGNOSIS — Z888 Allergy status to other drugs, medicaments and biological substances status: Secondary | ICD-10-CM | POA: Insufficient documentation

## 2017-11-27 DIAGNOSIS — S93401A Sprain of unspecified ligament of right ankle, initial encounter: Secondary | ICD-10-CM | POA: Insufficient documentation

## 2017-11-27 DIAGNOSIS — Z79890 Hormone replacement therapy (postmenopausal): Secondary | ICD-10-CM | POA: Insufficient documentation

## 2017-11-27 DIAGNOSIS — K219 Gastro-esophageal reflux disease without esophagitis: Secondary | ICD-10-CM | POA: Insufficient documentation

## 2017-11-27 DIAGNOSIS — F329 Major depressive disorder, single episode, unspecified: Secondary | ICD-10-CM | POA: Insufficient documentation

## 2017-11-27 DIAGNOSIS — Z833 Family history of diabetes mellitus: Secondary | ICD-10-CM | POA: Insufficient documentation

## 2017-11-27 DIAGNOSIS — F419 Anxiety disorder, unspecified: Secondary | ICD-10-CM | POA: Insufficient documentation

## 2017-11-27 DIAGNOSIS — M5416 Radiculopathy, lumbar region: Secondary | ICD-10-CM | POA: Insufficient documentation

## 2017-11-27 DIAGNOSIS — M545 Low back pain: Secondary | ICD-10-CM | POA: Insufficient documentation

## 2017-11-27 DIAGNOSIS — X500XXA Overexertion from strenuous movement or load, initial encounter: Secondary | ICD-10-CM | POA: Insufficient documentation

## 2017-11-27 DIAGNOSIS — F4310 Post-traumatic stress disorder, unspecified: Secondary | ICD-10-CM | POA: Insufficient documentation

## 2017-11-27 DIAGNOSIS — Z8249 Family history of ischemic heart disease and other diseases of the circulatory system: Secondary | ICD-10-CM | POA: Insufficient documentation

## 2017-11-27 DIAGNOSIS — M5441 Lumbago with sciatica, right side: Secondary | ICD-10-CM

## 2017-11-27 DIAGNOSIS — I10 Essential (primary) hypertension: Secondary | ICD-10-CM | POA: Insufficient documentation

## 2017-11-27 DIAGNOSIS — F32A Depression, unspecified: Secondary | ICD-10-CM

## 2017-11-27 DIAGNOSIS — Z6841 Body Mass Index (BMI) 40.0 and over, adult: Secondary | ICD-10-CM | POA: Insufficient documentation

## 2017-11-27 DIAGNOSIS — Z818 Family history of other mental and behavioral disorders: Secondary | ICD-10-CM | POA: Insufficient documentation

## 2017-11-27 MED ORDER — OXYCODONE HCL 5 MG PO TABS
10.0000 mg | ORAL_TABLET | Freq: Once | ORAL | Status: AC
  Administered 2017-11-27: 10 mg via ORAL
  Filled 2017-11-27: qty 2

## 2017-11-27 MED ORDER — CYCLOBENZAPRINE HCL 10 MG PO TABS
10.0000 mg | ORAL_TABLET | Freq: Once | ORAL | Status: AC
  Administered 2017-11-27: 10 mg via ORAL
  Filled 2017-11-27 (×2): qty 1

## 2017-11-27 MED ORDER — CYCLOBENZAPRINE HCL 10 MG PO TABS: 10.0000 mg | ORAL_TABLET | Freq: Three times a day (TID) | ORAL | 0 refills | Status: DC | PRN

## 2017-11-27 NOTE — Patient Instructions (Addendum)
Try a medical supply store for the ankle stabilizer and shower chair  I encourage you to keep appt on Thurs, if nothing else they can help with pain control  We are unable to refill the Oxycodone, it is too soon for a refill

## 2017-11-27 NOTE — Progress Notes (Signed)
Right leg numbness and tingling

## 2017-11-27 NOTE — Discharge Instructions (Signed)
Please read and follow all provided instructions.  Your diagnoses today include:  1. Lumbar radiculopathy   2. Sprain of right ankle, unspecified ligament, initial encounter     Tests performed today include:  An x-ray of your ankle - does NOT show any broken bones  Vital signs. See below for your results today.   Medications prescribed:   None  Take any prescribed medications only as directed.  Home care instructions:   Follow any educational materials contained in this packet  As per neurosurgery tonight.  Do not start taking Medrol Dosepak starting tomorrow.  Continue other medications.  Follow R.I.C.E. Protocol:  R - rest your injury   I  - use ice on injury without applying directly to skin  C - compress injury with bandage or splint  E - elevate the injury as much as possible  Follow-up instructions: Use ASO when walking for support.  Please call Dr. Barnett ApplebaumJones's office tomorrow morning and update them on your MRI.  Please follow-up per their direction..   Return instructions:   Please return if your toes are numb or tingling, appear gray or blue, or you have severe pain (also elevate leg and loosen splint or wrap)  Please return to the Emergency Department if you experience worsening symptoms.   Please return if you have any other emergent concerns.  Additional Information:  Your vital signs today were: BP (!) 110/92 (BP Location: Right Arm)    Pulse (!) 118    Temp 98.9 F (37.2 C) (Oral)    Resp 18    Wt (!) 146.5 kg (323 lb)    LMP 11/01/2017    SpO2 97%    BMI 59.08 kg/m  If your blood pressure (BP) was elevated above 135/85 this visit, please have this repeated by your doctor within one month. --------------

## 2017-11-27 NOTE — Progress Notes (Signed)
Sonya Gibson  UJW:[id number]  FAO:[id number]  DOB - [birth year]  Chief Complaint  Patient presents with  . Hospitalization Follow-up       Subjective:   Sonya Gibson is a 32 y.o. female here today for establishment of care.  She has a past medical history of anxiety mixed with depression as well as PTSD, morbid obesity, gastroesophageal reflux disease, hypothyroidism and elevated blood pressure.  She was hospitalized 11/14/2017 through 11/21/2017 with low back pain.  It was more right-sided.  Radiation to her right buttock and thigh.  No recent injury.  Did this while just stepping up into her husband's truck.  She had urinary incontinence times multiple times in the emergency department which led to her hospitalization.   An MRI of the spine showed multi-lumbar and S1 abnormalities with small disc protrusions with mild left foraminal stenosis.  She was seen by neurosurgery.  She was given steroid injection and sent home with a steroid taper.  She also has pain medication and muscle relaxers.  On 11/24/2017 she went to a local emergency room with continued pain.  Repeat imaging showed some worsening of the L4-L5 disc herniation into the right lateral recess.  Severe right lateral recess stenosis.  L5 radiculitis.  Enlargement of her sequestered disc fragment.  It was recommended that she see Dr. Lucius Conn (Ortho) and she has an appt on this Thurs. Told to restart the steroids and take Celebrex.  She is not mobile.  She is using a walker for most of her activities.  Her range of motion is decreased.  She still has pain with numbness especially on the right foot.  She has fallen daily. HHPT starts tomorrow.   ROS: GEN: denies fever or chills, denies change in weight Skin: denies lesions or rashes HEENT: denies headache, earache, epistaxis, sore throat, + neck pain LUNGS: denies SHOB, dyspnea, PND, orthopnea CV: denies CP or palpitations ABD: denies abd pain, N or V EXT: denies muscle spasms or  swelling; no pain in lower ext, no weakness NEURO:+ numbness or tingling, denies sz, stroke or TIA   ALLERGIES: Allergies  Allergen Reactions  . Coconut Oil Anaphylaxis  . Nitrofurantoin Hives    PAST MEDICAL HISTORY: Past Medical History:  Diagnosis Date  . Anxiety   . Depression   . Essential hypertension   . GERD (gastroesophageal reflux disease)   . Hypothyroidism   . PTSD (post-traumatic stress disorder)     PAST SURGICAL HISTORY: Past Surgical History:  Procedure Laterality Date  . ANTERIOR CRUCIATE LIGAMENT REPAIR Left   . CESAREAN SECTION    . IR EPIDUROGRAPHY  11/17/2017  . middle finger reatachment      MEDICATIONS AT HOME: Prior to Admission medications   Medication Sig Start Date End Date Taking? Authorizing Provider  clonazePAM (KLONOPIN) 2 MG tablet Take 1 tablet (2 mg total) by mouth 2 (two) times daily for 15 doses. 11/21/17 11/29/17  Amin, Loura Halt, MD  cyclobenzaprine (FLEXERIL) 10 MG tablet Take 1 tablet (10 mg total) by mouth 3 (three) times daily as needed for muscle spasms. 11/27/17   Vivianne Master, PA-C  gabapentin (NEURONTIN) 300 MG capsule Take 2 capsules (600 mg total) by mouth 3 (three) times daily. 11/21/17 12/21/17  Amin, Loura Halt, MD  lactulose (CHRONULAC) 10 GM/15ML solution Take 30 mLs (20 g total) by mouth 2 (two) times daily as needed for up to 15 days for moderate constipation or severe constipation. 11/21/17 12/06/17  Dimple Nanas, MD  levothyroxine (  SYNTHROID, LEVOTHROID) 50 MCG tablet Take 50 mcg by mouth at bedtime.  06/06/17   [provider]  Melatonin 10 MG TABS Take 10 mg by mouth daily as needed (sleep).    [provider]  methylPREDNISolone (MEDROL DOSEPAK) 4 MG TBPK tablet Take 1 Medrol Dose pak as Directed 11/21/17   Amin, Ankit Chirag, MD  ondansetron (ZOFRAN ODT) 8 MG disintegrating tablet Take 1 tablet (8 mg total) by mouth every 8 (eight) hours as needed for up to 20 doses for nausea or vomiting. 11/21/17    Amin, Loura Halt, MD  oxyCODONE 10 MG TABS Take 1 tablet (10 mg total) by mouth every 4 (four) hours as needed for up to 7 days for moderate pain or severe pain. 11/21/17 11/28/17  Amin, Loura Halt, MD  polyethylene glycol (MIRALAX / GLYCOLAX) packet Take 17 g by mouth daily as needed for up to 15 doses for moderate constipation. 11/21/17   Amin, Loura Halt, MD  promethazine (PHENERGAN) 12.5 MG tablet Take 1 tablet (12.5 mg total) by mouth every 6 (six) hours as needed for refractory nausea / vomiting. 11/21/17   Amin, Loura Halt, MD  traZODone (DESYREL) 150 MG tablet Take 150 mg by mouth at bedtime as needed for sleep.    [provider]  venlafaxine XR (EFFEXOR-XR) 150 MG 24 hr capsule Take 225 mg by mouth at bedtime. 150mg  and 75mg  nightly 06/06/17 11/15/17  [provider]    Family History  Problem Relation Age of Onset  . Diabetes Mellitus II Mother   . Hypertension Mother   . Seizures Mother   . Hypertension Father   . Diabetes Mellitus II Father   . Hyperlipidemia Father   . Bipolar disorder Sister    Social-married, children, previously an EMT, nonsmoker  Objective:   Vitals:   11/27/17 1445  BP: 115/80  Pulse: (!) 105  Resp: 16  Temp: 98.1 F (36.7 C)  TempSrc: Oral  SpO2: 97%  Weight: (!) 323 lb (146.5 kg)    Exam General appearance : Awake, alert, not in any distress. Speech Clear. Not toxic looking HEENT: Atraumatic and Normocephalic, pupils equally reactive to light and accomodation Neck: supple, no JVD. No cervical lymphadenopathy.  Extremities: B/L Lower Ext shows no edema, both legs are warm to touch; dec ROM and dec strength Neurology: Awake alert, and oriented X 3, CN II-XII intact, grossly normal Skin:No Rash Wounds:N/A   Assessment & Plan  1. Acute Low Back Pain  -cont pain meds/steroids/gaba  -appt with ortho this week  -appt with neuro surg in 2 weeks  -HHPT  2. Morbid Obesity  -will need bariatric referral once cleared by  ortho/neurosurg  3. Anxiety m/w Depression/PTSD  -cont Effexor, Trazadone, anxiolytics    Return in about 1 month (around 12/25/2017).  The patient was given clear instructions to go to ER or return to medical center if symptoms don't improve, worsen or new problems develop. The patient verbalized understanding. The patient was told to call to get lab results if they haven't heard anything in the next week.   Total time spent with patient was 33 min. Greater than 50 % of this visit was spent face to face counseling and coordinating care regarding risk factor modification, compliance importance and encouragement, education related to back pain and follow up.  This note has been created with Education officer, environmental. Any transcriptional errors are unintentional.    Conchita Paris [facility] [place], [place] [phone]   11/27/2017, 3:57 PM

## 2017-11-27 NOTE — ED Provider Notes (Signed)
[facility] EMERGENCY DEPARTMENT Provider Note   CSN: [id number] Arrival date & time: 11/27/17  1813     History   Chief Complaint Chief Complaint  Patient presents with  . Ankle Pain    HPI Sonya Gibson is a 32 y.o. female.  Patient with known history of L5 disc bulge, recent admission for back pain and sciatica evaluated by Dr. Yetta Barre, plan for back injections, possible surgery -- presents with complaint of ankle injury.  Patient was ambulating today and rolled over her right ankle.  She has no sensation below her right thigh.  She has had worsening numbness, weakness, saddle paresthesias.  These were present on recent admission, discharged 11/21/2017.  Patient has just finished a steroid taper.  She has been on gabapentin, oxycodone 10 mg tablets, muscle relaxer.  She continues to take these.  Patient had a another MRI performed on 11/24/2017 at [facility].  This showed worsening disc protrusion into the right lateral recess, radiculitis, and retained disc fragment.  Patient has not informed her neurosurgery group about these results yet.  She states that the ambulate she is having to drag her foot and she cannot tell when it strikes the ground, leading to her injury today.     Past Medical History:  Diagnosis Date  . Anxiety   . Depression   . Essential hypertension   . GERD (gastroesophageal reflux disease)   . Hypothyroidism   . PTSD (post-traumatic stress disorder)     Patient Active Problem List   Diagnosis Date Noted  . Weakness of right lower extremity 11/17/2017  . Lower back pain 11/15/2017  . Hypothyroidism   . Anxiety   . Essential hypertension   . PTSD (post-traumatic stress disorder)   . Decreased sensation of leg   . GESTATIONAL DIABETES 04/26/2010    Past Surgical History:  Procedure Laterality Date  . ANTERIOR CRUCIATE LIGAMENT REPAIR Left   . CESAREAN SECTION    . IR EPIDUROGRAPHY  11/17/2017  . middle finger reatachment        OB History   None      Home Medications    Prior to Admission medications   Medication Sig Start Date End Date Taking? Authorizing Provider  clonazePAM (KLONOPIN) 2 MG tablet Take 1 tablet (2 mg total) by mouth 2 (two) times daily for 15 doses. 11/21/17 11/29/17  Amin, Loura Halt, MD  cyclobenzaprine (FLEXERIL) 10 MG tablet Take 1 tablet (10 mg total) by mouth 3 (three) times daily as needed for muscle spasms. 11/27/17   Vivianne Master, PA-C  gabapentin (NEURONTIN) 300 MG capsule Take 2 capsules (600 mg total) by mouth 3 (three) times daily. 11/21/17 12/21/17  Amin, Loura Halt, MD  lactulose (CHRONULAC) 10 GM/15ML solution Take 30 mLs (20 g total) by mouth 2 (two) times daily as needed for up to 15 days for moderate constipation or severe constipation. 11/21/17 12/06/17  Amin, Loura Halt, MD  levothyroxine (SYNTHROID, LEVOTHROID) 50 MCG tablet Take 50 mcg by mouth at bedtime.  06/06/17   [provider]  Melatonin 10 MG TABS Take 10 mg by mouth daily as needed (sleep).    [provider]  methylPREDNISolone (MEDROL DOSEPAK) 4 MG TBPK tablet Take 1 Medrol Dose pak as Directed 11/21/17   Amin, Ankit Chirag, MD  ondansetron (ZOFRAN ODT) 8 MG disintegrating tablet Take 1 tablet (8 mg total) by mouth every 8 (eight) hours as needed for up to 20 doses for nausea or vomiting.  11/21/17   Amin, Loura HaltAnkit Chirag, MD  oxyCODONE 10 MG TABS Take 1 tablet (10 mg total) by mouth every 4 (four) hours as needed for up to 7 days for moderate pain or severe pain. 11/21/17 11/28/17  Amin, Loura HaltAnkit Chirag, MD  polyethylene glycol (MIRALAX / GLYCOLAX) packet Take 17 g by mouth daily as needed for up to 15 doses for moderate constipation. 11/21/17   Amin, Loura HaltAnkit Chirag, MD  promethazine (PHENERGAN) 12.5 MG tablet Take 1 tablet (12.5 mg total) by mouth every 6 (six) hours as needed for refractory nausea / vomiting. 11/21/17   Amin, Loura HaltAnkit Chirag, MD  traZODone (DESYREL) 150 MG tablet Take 150 mg by mouth at bedtime as  needed for sleep.    [provider]  venlafaxine XR (EFFEXOR-XR) 150 MG 24 hr capsule Take 225 mg by mouth at bedtime. 150mg  and 75mg  nightly 06/06/17 11/15/17  [provider]    Family History Family History  Problem Relation Age of Onset  . Diabetes Mellitus II Mother   . Hypertension Mother   . Seizures Mother   . Hypertension Father   . Diabetes Mellitus II Father   . Hyperlipidemia Father   . Bipolar disorder Sister     Social History Social History   Tobacco Use  . Smoking status: Never Smoker  . Smokeless tobacco: Never Used  Substance Use Topics  . Alcohol use: Yes    Comment: rare  . Drug use: No     Allergies   Coconut oil and Nitrofurantoin   Review of Systems Review of Systems  Constitutional: Negative for fever and unexpected weight change.  Gastrointestinal: Negative for constipation.       Negative for fecal incontinence.   Genitourinary: Positive for enuresis. Negative for dysuria, flank pain, hematuria, pelvic pain, vaginal bleeding and vaginal discharge.       Negative for urinary incontinence or retention.  Musculoskeletal: Positive for back pain. Negative for myalgias.  Neurological: Positive for weakness and numbness.       Denies saddle paresthesias.     Physical Exam Updated Vital Signs Wt (!) 146.5 kg (323 lb)   LMP 11/01/2017   BMI 59.08 kg/m   Physical Exam  Constitutional: She appears well-developed and well-nourished.  HENT:  Head: Normocephalic and atraumatic.  Eyes: Conjunctivae are normal.  Neck: Normal range of motion. Neck supple.  Pulmonary/Chest: Effort normal.  Abdominal: Soft. There is no tenderness. There is no CVA tenderness.  Musculoskeletal:       Right hip: She exhibits normal range of motion, normal strength and no tenderness.       Right knee: She exhibits normal range of motion.       Right ankle: She exhibits decreased range of motion. She exhibits no swelling.       Right foot: Normal.    No step-off noted with palpation of spine.  Patient has no sensation below her proximal thigh.  I am able to fully range her knee and foot actively.  Patient has generalized weakness in the flexors and extensors of the knee and the foot consistent with her known radiculopathy.  No excessive laxity in the ankle.  Neurological: She is alert. She has normal strength and normal reflexes. No sensory deficit.  5/5 strength in entire lower extremities bilaterally. No sensation deficit.   Skin: Skin is warm and dry. No rash noted.  Psychiatric: She has a normal mood and affect.  Nursing note and vitals reviewed.    ED Treatments /  Results  Labs (all labs ordered are listed, but only abnormal results are displayed) Labs Reviewed - No data to display  EKG None  Radiology No results found.  Procedures Procedures (including critical care time)  Medications Ordered in ED Medications - No data to display   Initial Impression / Assessment and Plan / ED Course  I have reviewed the triage vital signs and the nursing notes.  Pertinent labs & imaging results that were available during my care of the patient were reviewed by me and considered in my medical decision making (see chart for details).     Patient seen and examined.  Will x-ray ankle to evaluate for fracture.  Vital signs reviewed and are as follows: BP (!) 110/92 (BP Location: Right Arm)   Pulse 94   Temp 98.9 F (37.2 C) (Oral)   Resp 17   Wt (!) 146.5 kg (323 lb)   LMP 11/01/2017   SpO2 97%   BMI 59.08 kg/m   While waiting for the x-ray, I reviewed MRI findings with Dr. Franky Macho.  He states that the patient has worsening of her bulging disc but no acute indications for admission or surgery, and that he would not change the current plan other than not starting the repeat Dosepak tomorrow.  He asked me to request that the patient call Dr. Barnett Applebaum office in the morning and update them on her condition.  I relayed this to the  patient and her husband and updated them on the x-ray results.  Will place an ASO to give patient more support due to sensation deficit and weakness.  Patient encouraged to return if she is unable to ambulate, has worsening symptoms or other concerns.  Final Clinical Impressions(s) / ED Diagnoses   Final diagnoses:  Lumbar radiculopathy  Sprain of right ankle, unspecified ligament, initial encounter   Lumbar radiculopathy: Chronic with worsening MRI.  She was recently admitted.  She has good neurosurgery follow-up.  Discussion with Dr. Franky Macho as per above.  Right ankle sprain: Patient does not have pain due to the sensation deficit.  Imaging is negative.  Patient given ASO for support given injury and deficits.  ED Discharge Orders    None       Desmond Dike 11/27/17 2119    Loren Racer, MD 11/30/17 [phone]

## 2017-11-27 NOTE — ED Triage Notes (Signed)
Per Pt: Pt c/o R ankle pain. Pt has extensive lower back injury and cant feel her R leg. Pt rolled R ankle today. Pain felt her ankle pop. Pt states the pain has since gone away.

## 2017-11-27 NOTE — ED Notes (Signed)
Pt verbalized understanding discharge instructions and denies any further needs or questions at this time. VS stable, ambulatory and steady gait.   See EDP assessment / note.   

## 2017-11-30 ENCOUNTER — Ambulatory Visit (HOSPITAL_COMMUNITY)
Admission: EM | Admit: 2017-11-30 | Discharge: 2017-12-06 | Disposition: A | Discharge status: Home / Self Care | Payer: Self-pay | Attending: Internal Medicine | Admitting: Internal Medicine

## 2017-11-30 ENCOUNTER — Other Ambulatory Visit: Payer: Self-pay

## 2017-11-30 ENCOUNTER — Encounter (HOSPITAL_COMMUNITY): Payer: Self-pay

## 2017-11-30 DIAGNOSIS — Z9889 Other specified postprocedural states: Secondary | ICD-10-CM

## 2017-11-30 DIAGNOSIS — E1165 Type 2 diabetes mellitus with hyperglycemia: Secondary | ICD-10-CM | POA: Insufficient documentation

## 2017-11-30 DIAGNOSIS — M5126 Other intervertebral disc displacement, lumbar region: Secondary | ICD-10-CM

## 2017-11-30 DIAGNOSIS — E039 Hypothyroidism, unspecified: Secondary | ICD-10-CM | POA: Insufficient documentation

## 2017-11-30 DIAGNOSIS — I10 Essential (primary) hypertension: Secondary | ICD-10-CM | POA: Insufficient documentation

## 2017-11-30 DIAGNOSIS — F419 Anxiety disorder, unspecified: Secondary | ICD-10-CM

## 2017-11-30 DIAGNOSIS — R262 Difficulty in walking, not elsewhere classified: Secondary | ICD-10-CM | POA: Insufficient documentation

## 2017-11-30 DIAGNOSIS — M545 Low back pain, unspecified: Secondary | ICD-10-CM | POA: Diagnosis present

## 2017-11-30 DIAGNOSIS — Z6841 Body Mass Index (BMI) 40.0 and over, adult: Secondary | ICD-10-CM | POA: Insufficient documentation

## 2017-11-30 DIAGNOSIS — Z881 Allergy status to other antibiotic agents status: Secondary | ICD-10-CM | POA: Insufficient documentation

## 2017-11-30 DIAGNOSIS — Z79899 Other long term (current) drug therapy: Secondary | ICD-10-CM | POA: Insufficient documentation

## 2017-11-30 DIAGNOSIS — K5900 Constipation, unspecified: Secondary | ICD-10-CM | POA: Insufficient documentation

## 2017-11-30 DIAGNOSIS — R29898 Other symptoms and signs involving the musculoskeletal system: Secondary | ICD-10-CM | POA: Diagnosis present

## 2017-11-30 DIAGNOSIS — R200 Anesthesia of skin: Secondary | ICD-10-CM | POA: Diagnosis present

## 2017-11-30 DIAGNOSIS — M5440 Lumbago with sciatica, unspecified side: Secondary | ICD-10-CM

## 2017-11-30 DIAGNOSIS — F4310 Post-traumatic stress disorder, unspecified: Secondary | ICD-10-CM | POA: Insufficient documentation

## 2017-11-30 DIAGNOSIS — R32 Unspecified urinary incontinence: Secondary | ICD-10-CM | POA: Insufficient documentation

## 2017-11-30 DIAGNOSIS — Z79890 Hormone replacement therapy (postmenopausal): Secondary | ICD-10-CM | POA: Insufficient documentation

## 2017-11-30 DIAGNOSIS — M21371 Foot drop, right foot: Secondary | ICD-10-CM | POA: Insufficient documentation

## 2017-11-30 DIAGNOSIS — T380X5A Adverse effect of glucocorticoids and synthetic analogues, initial encounter: Secondary | ICD-10-CM | POA: Insufficient documentation

## 2017-11-30 DIAGNOSIS — G8929 Other chronic pain: Secondary | ICD-10-CM | POA: Insufficient documentation

## 2017-11-30 DIAGNOSIS — L089 Local infection of the skin and subcutaneous tissue, unspecified: Secondary | ICD-10-CM | POA: Insufficient documentation

## 2017-11-30 DIAGNOSIS — M5116 Intervertebral disc disorders with radiculopathy, lumbar region: Secondary | ICD-10-CM | POA: Insufficient documentation

## 2017-11-30 DIAGNOSIS — K219 Gastro-esophageal reflux disease without esophagitis: Secondary | ICD-10-CM | POA: Insufficient documentation

## 2017-11-30 DIAGNOSIS — F418 Other specified anxiety disorders: Secondary | ICD-10-CM | POA: Insufficient documentation

## 2017-11-30 DIAGNOSIS — G549 Nerve root and plexus disorder, unspecified: Secondary | ICD-10-CM

## 2017-11-30 DIAGNOSIS — M5416 Radiculopathy, lumbar region: Secondary | ICD-10-CM

## 2017-11-30 LAB — CBC WITH DIFFERENTIAL/PLATELET
Abs Immature Granulocytes: 0.1 10*3/uL (ref 0.0–0.1)
Basophils Absolute: 0.1 10*3/uL (ref 0.0–0.1)
Basophils Relative: 1 %
Eosinophils Absolute: 0.2 10*3/uL (ref 0.0–0.7)
Eosinophils Relative: 2 %
HCT: 38 % (ref 36.0–46.0)
Hemoglobin: 11.8 g/dL — ABNORMAL LOW (ref 12.0–15.0)
Immature Granulocytes: 1 %
Lymphocytes Relative: 32 %
Lymphs Abs: 3.4 10*3/uL (ref 0.7–4.0)
MCH: 29.1 pg (ref 26.0–34.0)
MCHC: 31.1 g/dL (ref 30.0–36.0)
MCV: 93.8 fL (ref 78.0–100.0)
Monocytes Absolute: 0.5 10*3/uL (ref 0.1–1.0)
Monocytes Relative: 5 %
Neutro Abs: 6.5 10*3/uL (ref 1.7–7.7)
Neutrophils Relative %: 59 %
Platelets: 317 10*3/uL (ref 150–400)
RBC: 4.05 MIL/uL (ref 3.87–5.11)
RDW: 16.2 % — ABNORMAL HIGH (ref 11.5–15.5)
WBC: 10.9 10*3/uL — ABNORMAL HIGH (ref 4.0–10.5)

## 2017-11-30 LAB — BASIC METABOLIC PANEL
Anion gap: 10 (ref 5–15)
BUN: 13 mg/dL (ref 6–20)
CO2: 30 mmol/L (ref 22–32)
Calcium: 9.9 mg/dL (ref 8.9–10.3)
Chloride: 98 mmol/L — ABNORMAL LOW (ref 101–111)
Creatinine, Ser: 0.86 mg/dL (ref 0.44–1.00)
GFR calc Af Amer: >60 mL/min (ref >60)
GFR calc non Af Amer: >60 mL/min (ref >60)
Glucose, Bld: 107 mg/dL — ABNORMAL HIGH (ref 65–99)
Potassium: 4.3 mmol/L (ref 3.5–5.1)
Sodium: 138 mmol/L (ref 135–145)

## 2017-11-30 LAB — TYPE AND SCREEN
ABO/RH(D): B POS
Antibody Screen: NEGATIVE

## 2017-11-30 LAB — ABO/RH: ABO/RH(D): B POS

## 2017-11-30 MED ORDER — DEXAMETHASONE 4 MG PO TABS: 10.0000 mg | ORAL_TABLET | Freq: Every day | ORAL | Status: DC

## 2017-11-30 MED ORDER — LISINOPRIL 10 MG PO TABS
10.0000 mg | ORAL_TABLET | Freq: Every day | ORAL | Status: DC
  Administered 2017-12-01: 10 mg via ORAL
  Filled 2017-11-30: qty 1

## 2017-11-30 MED ORDER — OXYCODONE-ACETAMINOPHEN 5-325 MG PO TABS
2.0000 | ORAL_TABLET | ORAL | Status: DC | PRN
  Administered 2017-12-01 – 2017-12-05 (×14): 2 via ORAL
  Filled 2017-11-30 (×16): qty 2

## 2017-11-30 MED ORDER — HYDRALAZINE HCL 20 MG/ML IJ SOLN: 5.0000 mg | INTRAMUSCULAR | Status: DC | PRN

## 2017-11-30 MED ORDER — ZOLPIDEM TARTRATE 5 MG PO TABS: 5.0000 mg | ORAL_TABLET | Freq: Every evening | ORAL | Status: DC | PRN

## 2017-11-30 MED ORDER — SENNOSIDES-DOCUSATE SODIUM 8.6-50 MG PO TABS
1.0000 | ORAL_TABLET | Freq: Two times a day (BID) | ORAL | Status: DC
  Administered 2017-11-30 – 2017-12-06 (×10): 1 via ORAL
  Filled 2017-11-30 (×12): qty 1

## 2017-11-30 MED ORDER — LACTULOSE 10 GM/15ML PO SOLN
20.0000 g | Freq: Two times a day (BID) | ORAL | Status: DC | PRN
  Administered 2017-12-01: 20 g via ORAL
  Filled 2017-11-30 (×2): qty 30

## 2017-11-30 MED ORDER — HYDROMORPHONE HCL 2 MG/ML IJ SOLN
1.0000 mg | Freq: Once | INTRAMUSCULAR | Status: AC
  Administered 2017-11-30: 1 mg via INTRAVENOUS
  Filled 2017-11-30: qty 1

## 2017-11-30 MED ORDER — VENLAFAXINE HCL ER 75 MG PO CP24
225.0000 mg | ORAL_CAPSULE | Freq: Every day | ORAL | Status: DC
  Administered 2017-11-30 – 2017-12-05 (×6): 225 mg via ORAL
  Filled 2017-11-30 (×6): qty 1

## 2017-11-30 MED ORDER — HEPARIN SODIUM (PORCINE) 5000 UNIT/ML IJ SOLN
5000.0000 [IU] | Freq: Three times a day (TID) | INTRAMUSCULAR | Status: DC
Start: 2017-11-30 — End: 2017-12-04
  Administered 2017-11-30 – 2017-12-04 (×10): 5000 [IU] via SUBCUTANEOUS
  Filled 2017-11-30 (×10): qty 1

## 2017-11-30 MED ORDER — CALCIUM CARBONATE ANTACID 500 MG PO CHEW
3.0000 | CHEWABLE_TABLET | ORAL | Status: DC | PRN
  Administered 2017-12-02 – 2017-12-04 (×2): 600 mg via ORAL
  Filled 2017-11-30 (×3): qty 3

## 2017-11-30 MED ORDER — LEVOTHYROXINE SODIUM 50 MCG PO TABS
50.0000 ug | ORAL_TABLET | Freq: Every day | ORAL | Status: DC
  Administered 2017-11-30 – 2017-12-05 (×6): 50 ug via ORAL
  Filled 2017-11-30 (×7): qty 1

## 2017-11-30 MED ORDER — LISINOPRIL-HYDROCHLOROTHIAZIDE 10-12.5 MG PO TABS: 1.0000 | ORAL_TABLET | Freq: Every day | ORAL | Status: DC

## 2017-11-30 MED ORDER — ACETAMINOPHEN 325 MG PO TABS: 650.0000 mg | ORAL_TABLET | Freq: Four times a day (QID) | ORAL | Status: DC | PRN

## 2017-11-30 MED ORDER — POLYETHYLENE GLYCOL 3350 17 G PO PACK
17.0000 g | PACK | Freq: Two times a day (BID) | ORAL | Status: DC
  Administered 2017-11-30 – 2017-12-02 (×4): 17 g via ORAL
  Filled 2017-11-30 (×4): qty 1

## 2017-11-30 MED ORDER — TRAZODONE HCL 150 MG PO TABS
150.0000 mg | ORAL_TABLET | Freq: Every evening | ORAL | Status: DC | PRN
  Administered 2017-12-01: 150 mg via ORAL
  Filled 2017-11-30 (×2): qty 1

## 2017-11-30 MED ORDER — ONDANSETRON HCL 4 MG/2ML IJ SOLN
4.0000 mg | Freq: Three times a day (TID) | INTRAMUSCULAR | Status: DC | PRN
  Administered 2017-11-30 – 2017-12-05 (×11): 4 mg via INTRAVENOUS
  Filled 2017-11-30 (×12): qty 2

## 2017-11-30 MED ORDER — GABAPENTIN 300 MG PO CAPS
600.0000 mg | ORAL_CAPSULE | Freq: Three times a day (TID) | ORAL | Status: DC
  Administered 2017-11-30 – 2017-12-06 (×18): 600 mg via ORAL
  Filled 2017-11-30 (×18): qty 2

## 2017-11-30 MED ORDER — PREDNISONE 20 MG PO TABS: 20.0000 mg | ORAL_TABLET | Freq: Every day | ORAL | Status: DC

## 2017-11-30 MED ORDER — IBUPROFEN 200 MG PO TABS
400.0000 mg | ORAL_TABLET | Freq: Four times a day (QID) | ORAL | Status: DC | PRN
  Administered 2017-12-01: 400 mg via ORAL
  Filled 2017-11-30: qty 2

## 2017-11-30 MED ORDER — HYDROCHLOROTHIAZIDE 12.5 MG PO CAPS
12.5000 mg | ORAL_CAPSULE | Freq: Every day | ORAL | Status: DC
  Administered 2017-12-01: 12.5 mg via ORAL
  Filled 2017-11-30: qty 1

## 2017-11-30 MED ORDER — HYDROMORPHONE HCL 2 MG/ML IJ SOLN
1.0000 mg | INTRAMUSCULAR | Status: DC | PRN
Start: 2017-11-30 — End: 2017-12-05
  Administered 2017-11-30 – 2017-12-05 (×17): 1 mg via INTRAVENOUS
  Filled 2017-11-30 (×17): qty 1

## 2017-11-30 MED ORDER — DEXAMETHASONE 4 MG PO TABS
10.0000 mg | ORAL_TABLET | Freq: Two times a day (BID) | ORAL | Status: DC
  Administered 2017-11-30 – 2017-12-01 (×2): 10 mg via ORAL
  Filled 2017-11-30 (×2): qty 3

## 2017-11-30 MED ORDER — ONDANSETRON HCL 4 MG/2ML IJ SOLN
4.0000 mg | Freq: Once | INTRAMUSCULAR | Status: AC
  Administered 2017-11-30: 4 mg via INTRAVENOUS
  Filled 2017-11-30: qty 2

## 2017-11-30 MED ORDER — CYCLOBENZAPRINE HCL 10 MG PO TABS
10.0000 mg | ORAL_TABLET | Freq: Three times a day (TID) | ORAL | Status: DC | PRN
  Administered 2017-12-01 – 2017-12-04 (×6): 10 mg via ORAL
  Filled 2017-11-30 (×6): qty 1

## 2017-11-30 MED ORDER — CLONAZEPAM 0.5 MG PO TABS
2.0000 mg | ORAL_TABLET | Freq: Once | ORAL | Status: AC
  Administered 2017-11-30: 2 mg via ORAL
  Filled 2017-11-30: qty 4

## 2017-11-30 MED ORDER — MELATONIN 3 MG PO TABS
9.0000 mg | ORAL_TABLET | Freq: Every day | ORAL | Status: DC | PRN
  Filled 2017-11-30: qty 3

## 2017-11-30 MED ORDER — CLONAZEPAM 1 MG PO TABS
2.0000 mg | ORAL_TABLET | Freq: Two times a day (BID) | ORAL | Status: DC
  Administered 2017-11-30 – 2017-12-06 (×12): 2 mg via ORAL
  Filled 2017-11-30: qty 2
  Filled 2017-11-30: qty 4
  Filled 2017-11-30 (×10): qty 2

## 2017-11-30 NOTE — ED Provider Notes (Addendum)
[facility]   CSN: [id number] Arrival date & time: 11/30/17  1229     History   Chief Complaint No chief complaint on file.   HPI Sonya Gibson is a 32 y.o. female.  32yo female presents with complaint of worsening low back pain, right leg weakness, now with perineal numbness (states "my lady parts are numb"), reports daily falls due to right leg numbness and difficulty walking. Difficulty with bowel and bladder habits- has to strain to empty bladder, taking 2 capfuls of Miralax in a mag citrate with 4 stool softeners to try and have a bowel movement. Symptoms started after stepping up into her husband's truck on 11/14/17. Patient has been seen twice previously with 2 prior MRIs, known herniated disc, taking Flexeril and recently completed steroid dose pack. Recently admitted, dc 11/27/17 for same, given a Lovenox injection and reports redness in the area of her injection. Scheduled to see neurosurgery on 12/15/17.     Past Medical History:  Diagnosis Date  . Anxiety   . Depression   . Essential hypertension   . GERD (gastroesophageal reflux disease)   . Hypothyroidism   . PTSD (post-traumatic stress disorder)     Patient Active Problem List   Diagnosis Date Noted  . Perineal numbness 11/30/2017  . Weakness of right lower extremity 11/17/2017  . Lower back pain 11/15/2017  . Hypothyroidism   . Anxiety   . Essential hypertension   . PTSD (post-traumatic stress disorder)   . Decreased sensation of leg   . GESTATIONAL DIABETES 04/26/2010    Past Surgical History:  Procedure Laterality Date  . ANTERIOR CRUCIATE LIGAMENT REPAIR Left   . CESAREAN SECTION    . IR EPIDUROGRAPHY  11/17/2017  . middle finger reatachment       OB History   None      Home Medications    Prior to Admission medications   Medication Sig Start Date End Date Taking? Authorizing Provider  calcium carbonate (TUMS EX) 750 MG chewable tablet  Chew 2 tablets by mouth as needed for heartburn.   Yes [provider]  clonazePAM (KLONOPIN) 2 MG tablet Take 1 tablet (2 mg total) by mouth 2 (two) times daily for 15 doses. 11/21/17 11/30/17 Yes Amin, Loura HaltAnkit Chirag, MD  cyclobenzaprine (FLEXERIL) 10 MG tablet Take 1 tablet (10 mg total) by mouth 3 (three) times daily as needed for muscle spasms. 11/27/17  Yes Danelle EarthlyNoel, Tiffany S, PA-C  gabapentin (NEURONTIN) 300 MG capsule Take 2 capsules (600 mg total) by mouth 3 (three) times daily. 11/21/17 12/21/17 Yes Amin, Loura HaltAnkit Chirag, MD  ibuprofen (ADVIL,MOTRIN) 200 MG tablet Take 800 mg by mouth as needed for moderate pain.   Yes [provider]  lactulose (CHRONULAC) 10 GM/15ML solution Take 30 mLs (20 g total) by mouth 2 (two) times daily as needed for up to 15 days for moderate constipation or severe constipation. 11/21/17 12/06/17 Yes Amin, Ankit Chirag, MD  levothyroxine (SYNTHROID, LEVOTHROID) 50 MCG tablet Take 50 mcg by mouth at bedtime.  06/06/17  Yes [provider]  lisinopril-hydrochlorothiazide (PRINZIDE,ZESTORETIC) 10-12.5 MG tablet Take 1 tablet by mouth daily. 10/27/17  Yes [provider]  Melatonin 10 MG TABS Take 10 mg by mouth daily as needed (sleep).   Yes [provider]  ondansetron (ZOFRAN ODT) 8 MG disintegrating tablet Take 1 tablet (8 mg total) by mouth every 8 (eight) hours as needed for up to 20 doses for nausea or  vomiting. 11/21/17  Yes Amin, Ankit Chirag, MD  oxycodone (OXY-IR) 5 MG capsule Take 5 mg by mouth every 6 (six) hours.   Yes [provider]  polyethylene glycol (MIRALAX / GLYCOLAX) packet Take 17 g by mouth daily as needed for up to 15 doses for moderate constipation. 11/21/17  Yes Amin, Loura Halt, MD  promethazine (PHENERGAN) 12.5 MG tablet Take 1 tablet (12.5 mg total) by mouth every 6 (six) hours as needed for refractory nausea / vomiting. 11/21/17  Yes Amin, Loura Halt, MD  traZODone (DESYREL) 150 MG tablet Take 150 mg by  mouth at bedtime as needed for sleep.   Yes [provider]  venlafaxine XR (EFFEXOR-XR) 150 MG 24 hr capsule Take 225 mg by mouth at bedtime. 150mg  and 75mg  nightly 06/06/17 11/30/17 Yes [provider]  HYDROcodone-acetaminophen (NORCO/VICODIN) 5-325 MG tablet Take 1 tablet by mouth every 6 (six) hours as needed for moderate pain.    [provider]    Family History Family History  Problem Relation Age of Onset  . Diabetes Mellitus II Mother   . Hypertension Mother   . Seizures Mother   . Hypertension Father   . Diabetes Mellitus II Father   . Hyperlipidemia Father   . Bipolar disorder Sister     Social History Social History   Tobacco Use  . Smoking status: Never Smoker  . Smokeless tobacco: Never Used  Substance Use Topics  . Alcohol use: Yes    Comment: rare  . Drug use: No     Allergies   Coconut oil and Nitrofurantoin   Review of Systems Review of Systems  Constitutional: Negative for fever.  Gastrointestinal: Positive for constipation. Negative for abdominal pain, diarrhea, nausea and vomiting.  Genitourinary: Positive for difficulty urinating.  Musculoskeletal: Positive for back pain and gait problem.  Skin: Positive for rash. Negative for wound.  Allergic/Immunologic: Negative for immunocompromised state.  Neurological: Positive for weakness and numbness.  Hematological: Does not bruise/bleed easily.  Psychiatric/Behavioral: Negative for confusion.  All other systems reviewed and are negative.    Physical Exam Updated Vital Signs BP (!) 114/92 (BP Location: Right Arm)   Pulse (!) 105   Temp 98.8 F (37.1 C) (Oral)   Resp 18   Ht 5\' 2"  (1.575 m)   Wt (!) 149.2 kg (329 lb)   LMP 11/01/2017   SpO2 98%   BMI 60.17 kg/m   Physical Exam  Constitutional: She is oriented to person, place, and time. She appears well-developed and well-nourished. No distress.  Cardiovascular: Normal rate, regular rhythm and intact distal  pulses.  Pulmonary/Chest: Effort normal.  Abdominal: Soft. She exhibits no distension. There is no tenderness.    Musculoskeletal: She exhibits tenderness. She exhibits no deformity.       Lumbar back: She exhibits tenderness. She exhibits no deformity.  Right lower back TTP  Neurological: She is alert and oriented to person, place, and time. A sensory deficit is present. She displays no Babinski's sign on the right side. She displays no Babinski's sign on the left side.  Reflex Scores:      Patellar reflexes are 3+ on the right side and 2+ on the left side. Right leg weakness compared to left. Reports numbness- able to feel pressure- diffuse right leg.  Skin: Skin is warm and dry. Capillary refill takes less than 2 seconds. She is not diaphoretic.  Psychiatric: She has a normal mood and affect. Her behavior is normal.  Nursing note and vitals  reviewed.    ED Treatments / Results  Labs (all labs ordered are listed, but only abnormal results are displayed) Labs Reviewed  BASIC METABOLIC PANEL - Abnormal; Notable for the following components:      Result Value   Chloride 98 (*)    Glucose, Bld 107 (*)    All other components within normal limits  CBC WITH DIFFERENTIAL/PLATELET - Abnormal; Notable for the following components:   WBC 10.9 (*)    Hemoglobin 11.8 (*)    RDW 16.2 (*)    All other components within normal limits    EKG None  Radiology No results found.  Procedures Procedures (including critical care time)  Medications Ordered in ED Medications  HYDROmorphone (DILAUDID) injection 1 mg (1 mg Intravenous Given 11/30/17 1729)  ondansetron (ZOFRAN) injection 4 mg (4 mg Intravenous Given 11/30/17 1729)  HYDROmorphone (DILAUDID) injection 1 mg (1 mg Intravenous Given 11/30/17 1930)  clonazePAM (KLONOPIN) tablet 2 mg (2 mg Oral Given 11/30/17 2113)     Initial Impression / Assessment and Plan / ED Course  I have reviewed the triage vital signs and the nursing  notes.  Pertinent labs & imaging results that were available during my care of the patient were reviewed by me and considered in my medical decision making (see chart for details).  Clinical Course as of Dec 01 2115  Thu Nov 30, 2017  1758 11/15/17 MRI  IMPRESSION: 1. No acute osseous abnormality or malalignment. 2. Small disc protrusions are present at the L3-4, L4-5, and L5-S1 levels. 3. Mild left L3-4 and mild right L4-5 foraminal stenosis. No significant canal stenosis. 4. Protrusion contact on the descending right L5 nerve root in the right L4-5 lateral recess.   [LM]  1758 33yo female with worsening low back pain, right leg weakness and numbness.    [LM]  1906 Worsening pain, additional pain medication ordered.   [LM]  2018 Patient reports feeling anxious, takes 2mg  Klonopin BID with PRN dosing, due for her regular dose at this time. Medication ordered.   [LM]  2039 Care discussed with Dr. Corlis Leak, ER attending, patient with perineal numbness, right leg weakness/numbness, worsening pain, known herniated disc,recommends consult for admission. Consult to neurosurgery who will see the patient tomorrow in the hospital if medicine will admit.    [LM]  2113 Discussed with Dr. Clyde Lundborg, hospitalist who will see the patient. Dr. Melvyn Novas available to consult tomorrow.    [LM]    Clinical Course User Index [LM] Jeannie Fend, PA-C      Final Clinical Impressions(s) / ED Diagnoses   Final diagnoses:  Nerve root compression syndrome  Lumbar radiculopathy    ED Discharge Orders    None       Jeannie Fend, PA-C 11/30/17 2115    Jeannie Fend, PA-C 11/30/17 2117    Abelino Derrick, MD 11/30/17 [phone]

## 2017-11-30 NOTE — ED Notes (Signed)
Dr Niu at bedside 

## 2017-11-30 NOTE — ED Triage Notes (Signed)
Patient here with ongoing lower back pain related to herniated disc. Has follow-up with neurosurgeon on 6/28. Taking meds as prescribed with no relief. Known L4-L5 herniation. Also has redness and pain to lower abdomen following lovenox injection

## 2017-11-30 NOTE — ED Notes (Signed)
Report given to oncoming RN.

## 2017-11-30 NOTE — H&P (Signed)
History and Physical    Sonya Gibson ZOX:[id number] DOB: 01-29-1986 DOA: 11/30/2017  Referring MD/NP/PA:   PCP: Hoy Register, MD   Patient coming from:  The patient is coming from home.  At baseline, pt is independent for most of ADL  Chief Complaint: lower back pain right leg weakness, urinary incontinence and perineal numbness.  HPI: Sonya Gibson is a 32 y.o. female with medical history significant of hypertension, GERD, PTSD, hypothyroidism, depression with anxiety, morbid obesity, who presents with lower back pain, right leg weakness, urinary incontinence and perineal numbness.  Pt has chronic lower back pain which is known to be due to lumbar radiculopathy and  right-sided disc herniation. She was hospitalized from 5/28-6/4. She had epidural steroid injection on 5/31. She was switched from Decadron to Medrol Dosepak at discharge.  She has been taking gabapentin, Flexeril, oxycodone for pain control.  Patient states that in the past several days, her lower back pain has worsened.  She had right leg numbness which has worsened.  She also has been having urinary incontinence since previous admission.  She developed perineal numbness which is new.  Patient states that she had an MRI on Friday in [facility], which showed progress L4-L5 disc herniation into the right lateral recess, severe right lateral recess stenosis and query right L5 radiculitis. Her pain is constant, sharp, radiating to the perineal area, 10 out of 10 in severity.  Aggravated by any movement.  Patient does not have chest pain, shortness of breath, cough, fever or chills.  No nausea, vomiting, diarrhea or abdominal pain.  No symptoms of UTI except for incontinence. She reports small abdominal wall areas with redness in the recent Lovenox injection location.   ED Course: pt was found to have some WBC 10.9, electrolytes renal function okay, temperature normal, tachycardia, no tachypnea, oxygen saturation 98% on room  air.  Patient is admitted to MedSurg bed as inpatient.  Neurosurgeon, Dr. Lovell Sheehan was consulted, Dr. Yetta Barre will see patient in the morning.  Review of Systems:   General: no fevers, chills, no body weight gain, has fatigue HEENT: no blurry vision, hearing changes or sore throat Respiratory: no dyspnea, coughing, wheezing CV: no chest pain, no palpitations GI: no nausea, vomiting, abdominal pain, diarrhea, constipation GU: no dysuria, burning on urination, increased urinary frequency, hematuria  Ext: no leg edema Neuro: no vision change or hearing loss. Has right leg weakness, urinary incontinence and perineal numbness. Skin: has small skin lesion in lower abdominal wall. MSK: has lower back pain. Heme: No easy bruising.  Travel history: No recent long distant travel.  Allergy:  Allergies  Allergen Reactions  . Coconut Oil Anaphylaxis  . Nitrofurantoin Hives    Past Medical History:  Diagnosis Date  . Anxiety   . Depression   . Essential hypertension   . GERD (gastroesophageal reflux disease)   . Hypothyroidism   . PTSD (post-traumatic stress disorder)     Past Surgical History:  Procedure Laterality Date  . ANTERIOR CRUCIATE LIGAMENT REPAIR Left   . CESAREAN SECTION    . IR EPIDUROGRAPHY  11/17/2017  . middle finger reatachment      Social History:  reports that she has never smoked. She has never used smokeless tobacco. She reports that she drinks alcohol. She reports that she does not use drugs.  Family History:  Family History  Problem Relation Age of Onset  . Diabetes Mellitus II Mother   . Hypertension Mother   . Seizures Mother   .  Hypertension Father   . Diabetes Mellitus II Father   . Hyperlipidemia Father   . Bipolar disorder Sister      Prior to Admission medications   Medication Sig Start Date End Date Taking? Authorizing Provider  calcium carbonate (TUMS EX) 750 MG chewable tablet Chew 2 tablets by mouth as needed for heartburn.   Yes [provider]  clonazePAM (KLONOPIN) 2 MG tablet Take 1 tablet (2 mg total) by mouth 2 (two) times daily for 15 doses. 11/21/17 11/30/17 Yes Amin, Loura Halt, MD  cyclobenzaprine (FLEXERIL) 10 MG tablet Take 1 tablet (10 mg total) by mouth 3 (three) times daily as needed for muscle spasms. 11/27/17  Yes Danelle Earthly, Tiffany S, PA-C  gabapentin (NEURONTIN) 300 MG capsule Take 2 capsules (600 mg total) by mouth 3 (three) times daily. 11/21/17 12/21/17 Yes Amin, Loura Halt, MD  ibuprofen (ADVIL,MOTRIN) 200 MG tablet Take 800 mg by mouth as needed for moderate pain.   Yes [provider]  lactulose (CHRONULAC) 10 GM/15ML solution Take 30 mLs (20 g total) by mouth 2 (two) times daily as needed for up to 15 days for moderate constipation or severe constipation. 11/21/17 12/06/17 Yes Amin, Ankit Chirag, MD  levothyroxine (SYNTHROID, LEVOTHROID) 50 MCG tablet Take 50 mcg by mouth at bedtime.  06/06/17  Yes [provider]  lisinopril-hydrochlorothiazide (PRINZIDE,ZESTORETIC) 10-12.5 MG tablet Take 1 tablet by mouth daily. 10/27/17  Yes [provider]  Melatonin 10 MG TABS Take 10 mg by mouth daily as needed (sleep).   Yes [provider]  ondansetron (ZOFRAN ODT) 8 MG disintegrating tablet Take 1 tablet (8 mg total) by mouth every 8 (eight) hours as needed for up to 20 doses for nausea or vomiting. 11/21/17  Yes Amin, Ankit Chirag, MD  oxycodone (OXY-IR) 5 MG capsule Take 5 mg by mouth every 6 (six) hours.   Yes [provider]  polyethylene glycol (MIRALAX / GLYCOLAX) packet Take 17 g by mouth daily as needed for up to 15 doses for moderate constipation. 11/21/17  Yes Amin, Loura Halt, MD  promethazine (PHENERGAN) 12.5 MG tablet Take 1 tablet (12.5 mg total) by mouth every 6 (six) hours as needed for refractory nausea / vomiting. 11/21/17  Yes Amin, Loura Halt, MD  traZODone (DESYREL) 150 MG tablet Take 150 mg by mouth at bedtime as needed for sleep.   Yes [provider]   venlafaxine XR (EFFEXOR-XR) 150 MG 24 hr capsule Take 225 mg by mouth at bedtime. 150mg  and 75mg  nightly 06/06/17 11/30/17 Yes [provider]  HYDROcodone-acetaminophen (NORCO/VICODIN) 5-325 MG tablet Take 1 tablet by mouth every 6 (six) hours as needed for moderate pain.    [provider]    Physical Exam: Vitals:   11/30/17 2116 11/30/17 2312 12/01/17 0032 12/01/17 0357  BP: 124/82 125/81 (!) 103/91 (!) 97/55  Pulse: 90 97 95 96  Resp: 18 16 16 18   Temp: 98.7 F (37.1 C)  98 F (36.7 C) 98 F (36.7 C)  TempSrc: Oral  Oral Oral  SpO2: 98% 97% 99% 93%  Weight:      Height:       General: in moderate acute distress due to pain HEENT:       Eyes: PERRL, EOMI, no scleral icterus.       ENT: No discharge from the ears and nose, no pharynx injection, no tonsillar enlargement.        Neck: No JVD, no bruit, no mass felt. Heme: No neck  lymph node enlargement. Cardiac: S1/S2, RRR, No murmurs, No gallops or rubs. Respiratory:  No rales, wheezing, rhonchi or rubs. GI: Soft, nondistended, nontender, no rebound pain, no organomegaly, BS present. GU: No hematuria Ext: No pitting leg edema bilaterally. 2+DP/PT pulse bilaterally. Musculoskeletal: has tenderness in lower back midline. Skin:  Has three small skin lesion in lower abdominal wall. Neuro: Alert, oriented X3, cranial nerves II-XII grossly intact, has mild weakness in right leg. Has decreased sensation in the right leg to light touch. Psych: Patient is not psychotic, no suicidal or hemocidal ideation.  Labs on Admission: I have personally reviewed following labs and imaging studies  CBC: Recent Labs  Lab 11/30/17 1715  WBC 10.9*  NEUTROABS 6.5  HGB 11.8*  HCT 38.0  MCV 93.8  PLT 317   Basic Metabolic Panel: Recent Labs  Lab 11/30/17 1715  NA 138  K 4.3  CL 98*  CO2 30  GLUCOSE 107*  BUN 13  CREATININE 0.86  CALCIUM 9.9   GFR: Estimated Creatinine Clearance: 133 mL/min (by C-G formula based  on SCr of 0.86 mg/dL). Liver Function Tests: No results for input(s): AST, ALT, ALKPHOS, BILITOT, PROT, ALBUMIN in the last 168 hours. No results for input(s): LIPASE, AMYLASE in the last 168 hours. No results for input(s): AMMONIA in the last 168 hours. Coagulation Profile: No results for input(s): INR, PROTIME in the last 168 hours. Cardiac Enzymes: No results for input(s): CKTOTAL, CKMB, CKMBINDEX, TROPONINI in the last 168 hours. BNP (last 3 results) No results for input(s): PROBNP in the last 8760 hours. HbA1C: No results for input(s): HGBA1C in the last 72 hours. CBG: No results for input(s): GLUCAP in the last 168 hours. Lipid Profile: No results for input(s): CHOL, HDL, LDLCALC, TRIG, CHOLHDL, LDLDIRECT in the last 72 hours. Thyroid Function Tests: No results for input(s): TSH, T4TOTAL, FREET4, T3FREE, THYROIDAB in the last 72 hours. Anemia Panel: No results for input(s): VITAMINB12, FOLATE, FERRITIN, TIBC, IRON, RETICCTPCT in the last 72 hours. Urine analysis:    Component Value Date/Time   COLORURINE YELLOW 11/14/2017 2051   APPEARANCEUR HAZY (A) 11/14/2017 2051   LABSPEC 1.028 11/14/2017 2051   PHURINE 5.0 11/14/2017 2051   GLUCOSEU NEGATIVE 11/14/2017 2051   HGBUR NEGATIVE 11/14/2017 2051   BILIRUBINUR NEGATIVE 11/14/2017 2051   KETONESUR NEGATIVE 11/14/2017 2051   PROTEINUR NEGATIVE 11/14/2017 2051   UROBILINOGEN 1.0 05/04/2015 0349   NITRITE NEGATIVE 11/14/2017 2051   LEUKOCYTESUR NEGATIVE 11/14/2017 2051   Sepsis Labs: @LABRCNTIP (procalcitonin:4,lacticidven:4) )No results found for this or any previous visit (from the past 240 hour(s)).   Radiological Exams on Admission: No results found.   EKG: Not done in ED, will get one.   Assessment/Plan Principal Problem:   Lower back pain Active Problems:   Hypothyroidism   Anxiety   Essential hypertension   Weakness of right lower extremity   Perineal numbness   Lower back pain: pt has new alarming  symptoms, perineal numbness. [facility] progress L4-L5 disc herniation into the right lateral recess, severe right lateral recess stenosis and query right L5 radiculitis. Neurosurgeon, Dr. Lovell SheehanJenkins was consulted by EDP-->Dr. Yetta BarreJones will see patient in the morning.  -will admit to med-surg bed as inpt - keep pt NPO in case pt need surgery -pain control: pr Percocet, Dilaudid, Tylenol, ibuprofen, gabapentin -Start Decadron 10 mg twice daily -PRN Flexeril - pt/ot when able to (not ordered yet)  Hypothyroidism: Last TSH was 1.64 on 04/26/10 -Continue home Synthroid  Anxiety and  PTSD: -Continue home Klonopin, Effexor  Essential hypertension: Blood pressure 114/92 -Hydralazine PRNcontinue prinzide  Small skin lesions in abdominal wall:  -wound care consult  DVT ppx: SQ Heparin (in case pt needs surgery, will not use lovenox) Code Status: Full code Family Communication: None at bed side.    Disposition Plan:  Anticipate discharge back to previous home environment Consults called: Neurosurgeon, Dr. Lovell Sheehan was consulted, Dr. Yetta Barre will see patient in the morning. Admission status: medical floor/inpt       Date of Service 12/01/2017    Lorretta Harp [facility] Pager [phone]  If 7PM-7AM, please contact night-coverage [email or web address] Password [email or web address] 12/01/2017, 5:20 AM

## 2017-12-01 ENCOUNTER — Other Ambulatory Visit: Payer: Self-pay | Admitting: Neurological Surgery

## 2017-12-01 LAB — BASIC METABOLIC PANEL
Anion gap: 8 (ref 5–15)
BUN: 15 mg/dL (ref 6–20)
CHLORIDE: 100 mmol/L — AB (ref 101–111)
CO2: 26 mmol/L (ref 22–32)
CREATININE: 0.89 mg/dL (ref 0.44–1.00)
Calcium: 8.9 mg/dL (ref 8.9–10.3)
Glucose, Bld: 184 mg/dL — ABNORMAL HIGH (ref 65–99)
Potassium: 4.7 mmol/L (ref 3.5–5.1)
SODIUM: 134 mmol/L — AB (ref 135–145)

## 2017-12-01 LAB — CBC
HCT: 38.7 % (ref 36.0–46.0)
Hemoglobin: 12.1 g/dL (ref 12.0–15.0)
MCH: 28.8 pg (ref 26.0–34.0)
MCHC: 31.3 g/dL (ref 30.0–36.0)
MCV: 92.1 fL (ref 78.0–100.0)
PLATELETS: 297 10*3/uL (ref 150–400)
RBC: 4.2 MIL/uL (ref 3.87–5.11)
RDW: 16.2 % — AB (ref 11.5–15.5)
WBC: 11 10*3/uL — AB (ref 4.0–10.5)

## 2017-12-01 MED ORDER — CEPHALEXIN 500 MG PO CAPS
500.0000 mg | ORAL_CAPSULE | Freq: Four times a day (QID) | ORAL | Status: DC
  Administered 2017-12-01 – 2017-12-05 (×16): 500 mg via ORAL
  Filled 2017-12-01 (×16): qty 1

## 2017-12-01 MED ORDER — DEXAMETHASONE 2 MG PO TABS
6.0000 mg | ORAL_TABLET | Freq: Four times a day (QID) | ORAL | Status: DC
  Administered 2017-12-01 – 2017-12-05 (×14): 6 mg via ORAL
  Filled 2017-12-01 (×2): qty 2
  Filled 2017-12-01: qty 3
  Filled 2017-12-01 (×5): qty 2
  Filled 2017-12-01: qty 3
  Filled 2017-12-01: qty 2
  Filled 2017-12-01: qty 3
  Filled 2017-12-01: qty 2
  Filled 2017-12-01 (×3): qty 3
  Filled 2017-12-01 (×3): qty 2

## 2017-12-01 MED ORDER — IBUPROFEN 200 MG PO TABS
600.0000 mg | ORAL_TABLET | Freq: Four times a day (QID) | ORAL | Status: DC | PRN
  Administered 2017-12-03 – 2017-12-05 (×2): 600 mg via ORAL
  Filled 2017-12-01 (×2): qty 3

## 2017-12-01 NOTE — Progress Notes (Signed)
Pt seen, MRI reviewed. HNP L4-5 R, but nothing seen that would cause cauda equina syndrome, infected lovenox shot areas on belly, on keflex and dressing changes. Will plan on surgery Tuesday when i'm on  Call if all agreeable.

## 2017-12-01 NOTE — Consult Note (Signed)
WOC Nurse wound consult note Reason for Consult: Full thickness wound in the subpannicular skin fold, intertriginous dermatitis surrounding Wound type: Moisture, pressure plus friction Pressure Injury POA: N/A Measurement: Affected area measures 4cm x 3.6cm with largest (superior) ulceration measuring 2cm x 0.8cm with depth obscured by the presence of necrotic slough. Wound bed:As described above Drainage (amount, consistency, odor) Scant serous to light yellow drainage Periwound: periwound erythema measuring 0.5cm circumferentially, mild warmth, no induration. Dressing procedure/placement/frequency: I will provide guidance for the nursing team to cleanse twice daily with NS, followed by application of our house antimicrobial textile (InterDry Ag+) for wicking of moisture, provision of a topical antimicrobial and to decrease friction in the intertriginous, subpannicular area.  Systemic antibiotic would be a useful adjunct, if you agree, please order.  WOC nursing team will not follow, but will remain available to this patient, the nursing and medical teams.  Please re-consult if needed. Thanks, Ladona MowLaurie Mikaelah Trostle, MSN, RN, GNP, Hans EdenCWOCN, CWON-AP, FAAN  Pager# [phone]

## 2017-12-01 NOTE — Progress Notes (Addendum)
[facility] PROGRESS NOTE  Sonya Gibson ZOX:[id number] DOB: 03/18/1986 DOA: 11/30/2017  PCP: Hoy RegisterNewlin, Enobong, MD  Brief History/Interval Summary: 32 year old Caucasian female with a past medical history of morbid obesity, GERD, hypertension, PTSD, depression with history of chronic back pain followed by spinal specialist in .  Has experienced a worsening symptoms in the last few weeks.  She had an epidural injection on May 31.  She went to see her spine specialist last week and had an MRI done at that time which showed that there was disc herniation.  Patient was [place] in [place].  However she started experiencing worsening symptoms in her right leg with weakness along with urinary incontinence.  She presented to the emergency department and was hospitalized.  Reason for Visit: Lower back pain with radiculopathy  Consultants: Neurosurgery  Procedures: None yet  Antibiotics: Keflex started on 6/14 for lower abdominal wall skin infection  Subjective/Interval History: Continues to have pain in the lower back with radiation down to the right leg.  She states that she is experiencing weakness in the right foot.  Has had urinary incontinence.  Currently pain is 6 out of 10 in intensity.  ROS: Denies any nausea or vomiting.  Objective:  Vital Signs  Vitals:   11/30/17 2312 12/01/17 0032 12/01/17 0357 12/01/17 0858  BP: 125/81 (!) 103/91 (!) 97/55 109/67  Pulse: 97 95 96   Resp: 16 16 18    Temp:  98 F (36.7 C) 98 F (36.7 C)   TempSrc:  Oral Oral   SpO2: 97% 99% 93%   Weight:      Height:       No intake or output data in the 24 hours ending 12/01/17 1101 Filed Weights   11/30/17 1931  Weight: (!) 149.2 kg (329 lb)    General appearance: alert, cooperative, appears stated age, no distress and morbidly obese Head: Normocephalic, without obvious abnormality, atraumatic Resp: clear to auscultation bilaterally Cardio: regular rate and  rhythm, S1, S2 normal, no murmur, click, rub or gallop GI: soft, non-tender; bowel sounds normal; no masses,  no organomegaly and Moist skin with some erythema noted in the lower abdomen. Extremities: extremities normal, atraumatic, no cyanosis or edema Lymph nodes: Cervical, supraclavicular, and axillary nodes normal. Neurologic: No cranial nerve deficits.  Does have weakness in the right lower extremity compared to the left.  Lab Results:  Data Reviewed: I have personally reviewed following labs and imaging studies  CBC: Recent Labs  Lab 11/30/17 1715 12/01/17 0500  WBC 10.9* 11.0*  NEUTROABS 6.5  --   HGB 11.8* 12.1  HCT 38.0 38.7  MCV 93.8 92.1  PLT 317 297    Basic Metabolic Panel: Recent Labs  Lab 11/30/17 1715 12/01/17 0500  NA 138 134*  K 4.3 4.7  CL 98* 100*  CO2 30 26  GLUCOSE 107* 184*  BUN 13 15  CREATININE 0.86 0.89  CALCIUM 9.9 8.9    GFR: Estimated Creatinine Clearance: 128.5 mL/min (by C-G formula based on SCr of 0.89 mg/dL).   Radiology Studies: No results found.   Medications:  Scheduled: . cephALEXin  500 mg Oral Q6H  . clonazePAM  2 mg Oral BID  . dexamethasone  10 mg Oral Q12H  . gabapentin  600 mg Oral TID  . heparin  5,000 Units Subcutaneous Q8H  . lisinopril  10 mg Oral Daily   And  . hydrochlorothiazide  12.5 mg Oral Daily  . levothyroxine  50 mcg Oral QHS  .  polyethylene glycol  17 g Oral BID  . senna-docusate  1 tablet Oral BID  . venlafaxine XR  225 mg Oral QHS   Continuous:  ZOX:WRUEAVWUJWJXB, calcium carbonate, cyclobenzaprine, hydrALAZINE, HYDROmorphone (DILAUDID) injection, ibuprofen, lactulose, Melatonin, ondansetron (ZOFRAN) IV, oxyCODONE-acetaminophen, traZODone, zolpidem  Assessment/Plan:    Low back pain, acute on chronic; with radiculopathy It appears that patient has had progression of her symptoms over the last few weeks.  She underwent epidural injection on May 31.  She is now experiencing worsening  radiculopathy and symptoms and signs concerning for neurological involvement.  Apparently had MRI done-prove recently which showed disc herniation.  Neurosurgery was consulted last night.  They will see the patient today.  Continue pain medications.  She was started on dexamethasone last night which will be continued.  Continue gabapentin.  Hypothyroidism Continue Synthroid  History of anxiety and PTSD Continue home medications.  History of essential hypertension BP is somewhat soft.  Hold her antihypertensive agents.  Possible cellulitis involving the skin over the lower abdomen Seen by wound care.  Underwent wound therapy.  We will place her on Keflex.  Morbid obesity Body mass index is 60.17 kg/m.  Followed by bariatric surgery with plans for intervention in the future.  ADDENDUM Patient not yet seen by neurosurgery. Dr. Yetta Barre' office called this afternoon. They will get a message to him.  DVT Prophylaxis: Subcutaneous heparin    Code Status: Full code Family Communication: Discussed with the patient Disposition Plan: Management as outlined above.  Await neurosurgery input.    LOS: 1 day   Osvaldo Shipper  [facility] Pager [phone] 12/01/2017, 11:01 AM  If 7PM-7AM, please contact night-coverage at [email or web address], password [facility]

## 2017-12-02 DIAGNOSIS — K5900 Constipation, unspecified: Secondary | ICD-10-CM

## 2017-12-02 LAB — GLUCOSE, CAPILLARY: Glucose-Capillary: 289 mg/dL — ABNORMAL HIGH (ref 65–99)

## 2017-12-02 MED ORDER — LACTULOSE 10 GM/15ML PO SOLN
20.0000 g | Freq: Two times a day (BID) | ORAL | Status: DC | PRN
  Administered 2017-12-02 – 2017-12-04 (×3): 20 g via ORAL
  Filled 2017-12-02 (×4): qty 30

## 2017-12-02 MED ORDER — POLYETHYLENE GLYCOL 3350 17 G PO PACK
17.0000 g | PACK | Freq: Three times a day (TID) | ORAL | Status: DC
  Administered 2017-12-02 – 2017-12-06 (×11): 17 g via ORAL
  Filled 2017-12-02 (×13): qty 1

## 2017-12-02 MED ORDER — FLEET ENEMA 7-19 GM/118ML RE ENEM
1.0000 | ENEMA | Freq: Every day | RECTAL | Status: DC | PRN
Start: 2017-12-02 — End: 2017-12-06
  Administered 2017-12-03 – 2017-12-04 (×2): 1 via RECTAL
  Filled 2017-12-02 (×2): qty 1

## 2017-12-02 MED ORDER — FLUCONAZOLE 100 MG PO TABS
100.0000 mg | ORAL_TABLET | Freq: Every day | ORAL | Status: DC
  Administered 2017-12-02 – 2017-12-06 (×5): 100 mg via ORAL
  Filled 2017-12-02 (×5): qty 1

## 2017-12-02 NOTE — Progress Notes (Signed)
Patient ID: Sonya Gibson, female   DOB: 07/27/1985, 32 y.o.   MRN: [id number] Vital signs are stable Impression undergoing treatment for infected sites of abdomen 2 over 4 disc herniation per Dr. Yetta BarreJones this coming week

## 2017-12-02 NOTE — Progress Notes (Signed)
[facility] PROGRESS NOTE  Sonya Gibson ZOX:[id number] DOB: December 22, 1985 DOA: 11/30/2017  PCP: Hoy Register, MD  Brief History/Interval Summary: 32 year old Caucasian female with a past medical history of morbid obesity, GERD, hypertension, PTSD, depression with history of chronic back pain followed by spinal specialist in [place].  Has experienced a worsening symptoms in the last few weeks.  She had an epidural injection on May 31.  She went to see her spine specialist last week and had an MRI done at that time which showed that there was disc herniation.  Patient was referred to neurosurgery in [place].  However she started experiencing worsening symptoms in her right leg with weakness along with urinary incontinence.  She presented to the emergency department and was hospitalized.  Reason for Visit: Lower back pain with radiculopathy  Consultants: Neurosurgery  Procedures: None yet  Antibiotics: Keflex started on 6/14 for lower abdominal wall skin infection  Subjective/Interval History: Patient continues to have back pain.  Continues to have lower abdominal pain at the site of her infection.  Has not had a bowel movement in a few days.    ROS: Denies any nausea or vomiting  Objective:  Vital Signs  Vitals:   12/01/17 0858 12/01/17 1415 12/01/17 2005 12/02/17 0422  BP: 109/67 112/60 130/71 131/72  Pulse:  99 (!) 117 100  Resp:  20 17 17   Temp:  97.6 F (36.4 C) 98 F (36.7 C) 98.2 F (36.8 C)  TempSrc:  Oral Oral Oral  SpO2:  95% 97% 100%  Weight:      Height:        Intake/Output Summary (Last 24 hours) at 12/02/2017 0859 Last data filed at 12/02/2017 0422 Gross per 24 hour  Intake 1920 ml  Output -  Net 1920 ml   Filed Weights   11/30/17 1931  Weight: (!) 149.2 kg (329 lb)    General appearance: Awake alert.  Morbidly obese.  In no distress. Resp: Clear to auscultation bilaterally.  No wheezing rales or rhonchi. Cardio: S1-S2 normal regular.  No  S3-S4.  No rubs murmurs or bruit GI: Abdomen is soft.  Nontender nondistended.  Bowel sounds are present.  No masses organomegaly.  Open skin wound noted in the lower abdomen.  Yellowish exudate present.  No active drainage.  Mild erythema in the surrounding area.  No warmth. Neurologic: Awake alert.  Oriented x3.  Does have weakness in the right lower extremity compared to the left.  Lab Results:  Data Reviewed: I have personally reviewed following labs and imaging studies  CBC: Recent Labs  Lab 11/30/17 1715 12/01/17 0500  WBC 10.9* 11.0*  NEUTROABS 6.5  --   HGB 11.8* 12.1  HCT 38.0 38.7  MCV 93.8 92.1  PLT 317 297    Basic Metabolic Panel: Recent Labs  Lab 11/30/17 1715 12/01/17 0500  NA 138 134*  K 4.3 4.7  CL 98* 100*  CO2 30 26  GLUCOSE 107* 184*  BUN 13 15  CREATININE 0.86 0.89  CALCIUM 9.9 8.9    GFR: Estimated Creatinine Clearance: 128.5 mL/min (by C-G formula based on SCr of 0.89 mg/dL).   Radiology Studies: No results found.   Medications:  Scheduled: . cephALEXin  500 mg Oral Q6H  . clonazePAM  2 mg Oral BID  . dexamethasone  6 mg Oral Q6H  . fluconazole  100 mg Oral Daily  . gabapentin  600 mg Oral TID  . heparin  5,000 Units Subcutaneous Q8H  . levothyroxine  50 mcg Oral QHS  . polyethylene glycol  17 g Oral BID  . senna-docusate  1 tablet Oral BID  . venlafaxine XR  225 mg Oral QHS   Continuous:  WRU:EAVWUJWJXBJYNPRN:acetaminophen, calcium carbonate, cyclobenzaprine, HYDROmorphone (DILAUDID) injection, ibuprofen, lactulose, Melatonin, ondansetron (ZOFRAN) IV, oxyCODONE-acetaminophen, sodium phosphate, traZODone, zolpidem  Assessment/Plan:    Low back pain, acute on chronic; with radiculopathy It appears that patient has had progression of her symptoms over the last few weeks.  She underwent epidural injection on May 31.  She is now experiencing worsening radiculopathy and symptoms and signs concerning for neurological involvement.  Apparently had MRI  recently which showed disc herniation.  Neurosurgery consulted.  Plan is for surgery on Tuesday.  Continue with dexamethasone for now.  Continue gabapentin.  Continue current pain medications.    Skin wound and mild cellulitis in the lower abdominal skin Seen by wound care.  Underwent wound therapy.  Placed on Keflex.  Continue.  Hypothyroidism Continue Synthroid  History of anxiety and PTSD Continue home medications.  History of essential hypertension BP was borderline low yesterday.  Her antihypertensives were held.  Blood pressure is stable now.    Constipation Most likely secondary to narcotics.  Continue laxatives.  She is noted to be on MiraLAX and lactulose.  Enema as needed.  Check TSH.  Morbid obesity Body mass index is 60.17 kg/m.  Followed by bariatric surgery with plans for intervention in the future.  DVT Prophylaxis: Subcutaneous heparin    Code Status: Full code Family Communication: Discussed with the patient Disposition Plan: Management as outlined above.  Surgery on Tuesday.    LOS: 2 days   Osvaldo ShipperGokul Shabreka Coulon  [facility] Pager [phone] 12/02/2017, 8:59 AM  If 7PM-7AM, please contact night-coverage at [email or web address], password [email or web address]

## 2017-12-03 DIAGNOSIS — T148XXA Other injury of unspecified body region, initial encounter: Secondary | ICD-10-CM

## 2017-12-03 DIAGNOSIS — L089 Local infection of the skin and subcutaneous tissue, unspecified: Secondary | ICD-10-CM

## 2017-12-03 LAB — GLUCOSE, CAPILLARY
Glucose-Capillary: 227 mg/dL — ABNORMAL HIGH (ref 65–99)
Glucose-Capillary: 267 mg/dL — ABNORMAL HIGH (ref 65–99)
Glucose-Capillary: 268 mg/dL — ABNORMAL HIGH (ref 65–99)
Glucose-Capillary: 303 mg/dL — ABNORMAL HIGH (ref 65–99)
Glucose-Capillary: 308 mg/dL — ABNORMAL HIGH (ref 65–99)

## 2017-12-03 LAB — TSH: TSH: 0.391 u[IU]/mL (ref 0.350–4.500)

## 2017-12-03 MED ORDER — INSULIN ASPART 100 UNIT/ML ~~LOC~~ SOLN
0.0000 [IU] | Freq: Three times a day (TID) | SUBCUTANEOUS | Status: DC
  Administered 2017-12-03: 11 [IU] via SUBCUTANEOUS
  Administered 2017-12-03: 8 [IU] via SUBCUTANEOUS
  Administered 2017-12-03: 5 [IU] via SUBCUTANEOUS
  Administered 2017-12-04: 11 [IU] via SUBCUTANEOUS
  Administered 2017-12-04: 8 [IU] via SUBCUTANEOUS
  Administered 2017-12-04: 3 [IU] via SUBCUTANEOUS
  Administered 2017-12-05: 5 [IU] via SUBCUTANEOUS

## 2017-12-03 MED ORDER — INSULIN ASPART 100 UNIT/ML ~~LOC~~ SOLN
0.0000 [IU] | Freq: Every day | SUBCUTANEOUS | Status: DC
  Administered 2017-12-04: 5 [IU] via SUBCUTANEOUS
  Administered 2017-12-04: 4 [IU] via SUBCUTANEOUS

## 2017-12-03 MED ORDER — BACITRACIN-NEOMYCIN-POLYMYXIN OINTMENT TUBE
TOPICAL_OINTMENT | Freq: Two times a day (BID) | CUTANEOUS | Status: DC
Start: 2017-12-03 — End: 2017-12-06
  Administered 2017-12-03 – 2017-12-06 (×7): via TOPICAL
  Filled 2017-12-03: qty 14
  Filled 2017-12-03 (×2): qty 1

## 2017-12-03 NOTE — Progress Notes (Signed)
[facility] PROGRESS NOTE  Sonya Gibson DOA: 11/30/2017  PCP: Hoy RegisterNewlin, Enobong, MD  Brief History/Interval Summary: 32 year old Caucasian female with a past medical history of morbid obesity, GERD, hypertension, PTSD, depression with history of chronic back pain followed by spinal specialist in [place].  Has experienced a worsening symptoms in the last few weeks.  She had an epidural injection on May 31.  She went to see her spine specialist last week and had an MRI done at that time which showed that there was disc herniation.  Patient was referred to [place]surgery in [place].  However she started experiencing worsening symptoms in her right leg with weakness along with urinary incontinence.  She presented to the emergency department and was hospitalized.  Reason for Visit: Lower back pain with radiculopathy  Consultants: Neurosurgery  Procedures: None yet  Antibiotics: Keflex started on 6/14 for lower abdominal wall skin infection  Subjective/Interval History: Patient continues to have back pain radiating down her right leg.  Given enema this morning and had a bowel movement.     ROS: Denies any vomiting.  No shortness of breath.  Objective:  Vital Signs  Vitals:   12/02/17 1407 12/02/17 2033 12/03/17 0410 12/03/17 0415  BP: 101/63 130/89 (!) 127/96 (!) 127/96  Pulse: 95 94 77 96  Resp: (!) 21 16 18 17   Temp:  98.3 F (36.8 C) 98 F (36.7 C) 98.1 F (36.7 C)  TempSrc:  Oral Oral Oral  SpO2: 98% 97% 96% 99%  Weight:      Gibson:        Intake/Output Summary (Last 24 hours) at 12/03/2017 1225 Last data filed at 12/03/2017 0415 Gross per 24 hour  Intake 1680 ml  Output -  Net 1680 ml   Filed Weights   11/30/17 1931  Weight: (!) 149.2 kg (329 lb)    General appearance: Alert.  In no distress. Resp: Clear to auscultation bilaterally.  No wheezing rales or rhonchi. Cardio: S1-S2 is normal regular.  No S3-S4.  No rubs murmurs  or bruit GI: Abdomen is soft.  Nontender nondistended.  Bowel sounds present.  No masses organomegaly.  Skin wound on the lower abdomen appears to be healing.  Very minimal erythema.  No active drainage. Neurologic: Awake alert.  In no distress.  Lab Results:  Data Reviewed: I have personally reviewed following labs and imaging studies  CBC: Recent Labs  Lab 11/30/17 1715 12/01/17 0500  WBC 10.9* 11.0*  NEUTROABS 6.5  --   HGB 11.8* 12.1  HCT 38.0 38.7  MCV 93.8 92.1  PLT 317 297    Basic Metabolic Panel: Recent Labs  Lab 11/30/17 1715 12/01/17 0500  NA 138 134*  K 4.3 4.7  CL 98* 100*  CO2 30 26  GLUCOSE 107* 184*  BUN 13 15  CREATININE 0.86 0.89  CALCIUM 9.9 8.9    GFR: Estimated Creatinine Clearance: 128.5 mL/min (by C-G formula based on SCr of 0.89 mg/dL).   Radiology Studies: No results found.   Medications:  Scheduled: . cephALEXin  500 mg Oral Q6H  . clonazePAM  2 mg Oral BID  . dexamethasone  6 mg Oral Q6H  . fluconazole  100 mg Oral Daily  . gabapentin  600 mg Oral TID  . heparin  5,000 Units Subcutaneous Q8H  . insulin aspart  0-15 Units Subcutaneous TID WC  . insulin aspart  0-5 Units Subcutaneous QHS  . levothyroxine  50 mcg Oral QHS  . neomycin-bacitracin-polymyxin  Topical BID  . polyethylene glycol  17 g Oral TID  . senna-docusate  1 tablet Oral BID  . venlafaxine XR  225 mg Oral QHS   Continuous:  WUJ:WJXBJYNWGNFAO, calcium carbonate, cyclobenzaprine, HYDROmorphone (DILAUDID) injection, ibuprofen, lactulose, Melatonin, ondansetron (ZOFRAN) IV, oxyCODONE-acetaminophen, sodium phosphate, traZODone, zolpidem  Assessment/Plan:    Low back pain, acute on chronic; with radiculopathy It appears that patient has had progression of her symptoms over the last few weeks.  She underwent epidural injection on May 31.  She is now experiencing worsening radiculopathy and symptoms and signs concerning for neurological involvement.  Apparently had  MRI recently in [place] which showed disc herniation.  Neurosurgery consulted.  Plan is for surgery on Tuesday.  Continue with dexamethasone for now.  Continue gabapentin.  Continue current pain medications.  Seems to be stable at this point in time.  Await surgery.  Skin wound and mild cellulitis in the lower abdominal skin Seen by wound care.  Placed on Keflex.  Might also benefit from bacitracin ointment.  Wound appears to be improving.  Hypothyroidism Continue Synthroid  History of anxiety and PTSD Continue home medications.  History of essential hypertension BP was borderline low initially.  Her antihypertensives were held.  Blood Pressure remains stable.  Constipation Most likely secondary to narcotics.  Continue laxatives.  She is noted to be on MiraLAX 3 times a day and lactulose as needed.  Enema as needed.Marland Kitchen  She was given an enema today.  She appears to have had a bowel movement.  Morbid obesity Body mass index is 60.17 kg/m.  Followed by bariatric surgery with plans for intervention in the future.  DVT Prophylaxis: Subcutaneous heparin    Code Status: Full code Family Communication: Discussed with the patient Disposition Plan: Stable.  Surgery on Tuesday.    LOS: 3 days   Osvaldo Shipper  [facility] Pager [phone] 12/03/2017, 12:25 PM  If 7PM-7AM, please contact night-coverage at [email or web address], password [facility]

## 2017-12-03 NOTE — Plan of Care (Signed)
  Problem: Pain Managment: Goal: General experience of comfort will improve Outcome: Progressing   Problem: Clinical Measurements: Goal: Ability to maintain clinical measurements within normal limits will improve Outcome: Progressing   Problem: Skin Integrity: Goal: Risk for impaired skin integrity will decrease Outcome: Progressing   

## 2017-12-03 NOTE — Progress Notes (Signed)
Subjective: Patient reports ready for surgery  Objective: Vital signs in last 24 hours: Temp:  [98 F (36.7 C)-98.3 F (36.8 C)] 98.1 F (36.7 C) (06/16 0415) Pulse Rate:  [77-96] 96 (06/16 0415) Resp:  [16-21] 17 (06/16 0415) BP: (101-130)/(63-96) 127/96 (06/16 0415) SpO2:  [96 %-99 %] 99 % (06/16 0415)  Intake/Output from previous day: 06/15 0701 - 06/16 0700 In: 2040 [P.O.:2040] Out: -  Intake/Output this shift: No intake/output data recorded.  Physical Exam: Mild DF weakness right.  Patient is quite anxious and complains of back pain.  Her abdominal injection sites appear to be healing with minimal erythema.  Lab Results: Recent Labs    11/30/17 1715 12/01/17 0500  WBC 10.9* 11.0*  HGB 11.8* 12.1  HCT 38.0 38.7  PLT 317 297   BMET Recent Labs    11/30/17 1715 12/01/17 0500  NA 138 134*  K 4.3 4.7  CL 98* 100*  CO2 30 26  GLUCOSE 107* 184*  BUN 13 15  CREATININE 0.86 0.89  CALCIUM 9.9 8.9    Studies/Results: No results found.  Assessment/Plan: Patient is ready for surgery.  Dr. Yetta BarreJones plans to proceed this Tuesday.    LOS: 3 days    Dorian HeckleSTERN,Ayana Imhof D, MD 12/03/2017, 10:47 AM

## 2017-12-04 LAB — CBC
HEMATOCRIT: 35.6 % — AB (ref 36.0–46.0)
HEMOGLOBIN: 11.1 g/dL — AB (ref 12.0–15.0)
MCH: 29 pg (ref 26.0–34.0)
MCHC: 31.2 g/dL (ref 30.0–36.0)
MCV: 93 fL (ref 78.0–100.0)
PLATELETS: 274 10*3/uL (ref 150–400)
RBC: 3.83 MIL/uL — AB (ref 3.87–5.11)
RDW: 15.7 % — ABNORMAL HIGH (ref 11.5–15.5)
WBC: 10.6 10*3/uL — AB (ref 4.0–10.5)

## 2017-12-04 LAB — HEMOGLOBIN A1C
Hgb A1c MFr Bld: 6.4 % — ABNORMAL HIGH (ref 4.8–5.6)
Mean Plasma Glucose: 136.98 mg/dL

## 2017-12-04 LAB — BASIC METABOLIC PANEL
ANION GAP: 10 (ref 5–15)
BUN: 14 mg/dL (ref 6–20)
CHLORIDE: 102 mmol/L (ref 101–111)
CO2: 24 mmol/L (ref 22–32)
Calcium: 8.6 mg/dL — ABNORMAL LOW (ref 8.9–10.3)
Creatinine, Ser: 0.94 mg/dL (ref 0.44–1.00)
GFR calc Af Amer: >60 mL/min (ref >60)
Glucose, Bld: 334 mg/dL — ABNORMAL HIGH (ref 65–99)
POTASSIUM: 4.4 mmol/L (ref 3.5–5.1)
Sodium: 136 mmol/L (ref 135–145)

## 2017-12-04 LAB — GLUCOSE, CAPILLARY
GLUCOSE-CAPILLARY: 186 mg/dL — AB (ref 65–99)
GLUCOSE-CAPILLARY: 292 mg/dL — AB (ref 65–99)
GLUCOSE-CAPILLARY: 307 mg/dL — AB (ref 65–99)
GLUCOSE-CAPILLARY: 347 mg/dL — AB (ref 65–99)
Glucose-Capillary: 260 mg/dL — ABNORMAL HIGH (ref 65–99)

## 2017-12-04 LAB — SURGICAL PCR SCREEN
MRSA, PCR: NEGATIVE
STAPHYLOCOCCUS AUREUS: NEGATIVE

## 2017-12-04 MED ORDER — TRAZODONE HCL 50 MG PO TABS
150.0000 mg | ORAL_TABLET | Freq: Every evening | ORAL | Status: DC | PRN
  Administered 2017-12-04 – 2017-12-05 (×2): 150 mg via ORAL
  Filled 2017-12-04 (×3): qty 1

## 2017-12-04 MED ORDER — CEFAZOLIN SODIUM-DEXTROSE 2-4 GM/100ML-% IV SOLN: 2.0000 g | INTRAVENOUS | Status: AC

## 2017-12-04 MED ORDER — DEXAMETHASONE SODIUM PHOSPHATE 10 MG/ML IJ SOLN
10.0000 mg | INTRAMUSCULAR | Status: AC
  Filled 2017-12-04: qty 1

## 2017-12-04 MED ORDER — CHLORHEXIDINE GLUCONATE CLOTH 2 % EX PADS
6.0000 | MEDICATED_PAD | Freq: Once | CUTANEOUS | Status: AC
  Administered 2017-12-04: 6 via TOPICAL

## 2017-12-04 NOTE — Progress Notes (Signed)
[facility] PROGRESS NOTE  Sonya Gibson ZOX:[id number] DOB: July 04, 1985 DOA: 11/30/2017  PCP: Hoy Register, MD  Brief History/Interval Summary: 32 year old Caucasian female with a past medical history of morbid obesity, GERD, hypertension, PTSD, depression with history of chronic back pain followed by spinal specialist in [place].  Has experienced a worsening symptoms in the last few weeks.  She had an epidural injection on May 31.  She went to see her spine specialist last week and had an MRI done at that time which showed that there was disc herniation.  Patient was referred to neurosurgery in [place].  However she started experiencing worsening symptoms in her right leg with weakness along with urinary incontinence.  She presented to the emergency department and was hospitalized.  Reason for Visit: Lower back pain with radiculopathy  Consultants: Neurosurgery  Procedures: None yet  Antibiotics: Keflex started on 6/14 for lower abdominal wall skin infection  Subjective/Interval History: Patient feels about the same.  She did have bowel movements yesterday.  Back pain persist.      ROS: Denies any nausea or vomiting.  Objective:  Vital Signs  Vitals:   12/03/17 0415 12/03/17 1434 12/03/17 2100 12/04/17 0421  BP: (!) 127/96 (!) 144/103 (!) 140/97 106/74  Pulse: 96 76 79 87  Resp: 17 (!) 22 20 20   Temp: 98.1 F (36.7 C) 97.8 F (36.6 C) 97.8 F (36.6 C) 98.1 F (36.7 C)  TempSrc: Oral Oral Oral Oral  SpO2: 99% 97% 95% 94%  Weight:      Height:        Intake/Output Summary (Last 24 hours) at 12/04/2017 0743 Last data filed at 12/03/2017 2259 Gross per 24 hour  Intake 1200 ml  Output -  Net 1200 ml   Filed Weights   11/30/17 1931  Weight: (!) 149.2 kg (329 lb)    General appearance: Awake alert.  In no distress. Resp: Clear to auscultation bilaterally Cardio: S1-S2 is normal regular.  No S3-S4.  No rubs murmurs or bruit GI: Abdomen is obese.   Nontender nondistended.  Continues to have yellowish exudate from the skin wound in the lower abdomen.  No surrounding erythema noted today. Neurologic: Right lower extremity weakness due to radiculopathy  Lab Results:  Data Reviewed: I have personally reviewed following labs and imaging studies  CBC: Recent Labs  Lab 11/30/17 1715 12/01/17 0500 12/04/17 0309  WBC 10.9* 11.0* 10.6*  NEUTROABS 6.5  --   --   HGB 11.8* 12.1 11.1*  HCT 38.0 38.7 35.6*  MCV 93.8 92.1 93.0  PLT 317 297 274    Basic Metabolic Panel: Recent Labs  Lab 11/30/17 1715 12/01/17 0500 12/04/17 0309  NA 138 134* 136  K 4.3 4.7 4.4  CL 98* 100* 102  CO2 30 26 24   GLUCOSE 107* 184* 334*  BUN 13 15 14   CREATININE 0.86 0.89 0.94  CALCIUM 9.9 8.9 8.6*    GFR: Estimated Creatinine Clearance: 121.7 mL/min (by C-G formula based on SCr of 0.94 mg/dL).   Radiology Studies: No results found.   Medications:  Scheduled: . cephALEXin  500 mg Oral Q6H  . clonazePAM  2 mg Oral BID  . dexamethasone  10 mg Intravenous On Call to OR  . dexamethasone  6 mg Oral Q6H  . fluconazole  100 mg Oral Daily  . gabapentin  600 mg Oral TID  . heparin  5,000 Units Subcutaneous Q8H  . insulin aspart  0-15 Units Subcutaneous TID WC  . insulin  aspart  0-5 Units Subcutaneous QHS  . levothyroxine  50 mcg Oral QHS  . neomycin-bacitracin-polymyxin   Topical BID  . polyethylene glycol  17 g Oral TID  . senna-docusate  1 tablet Oral BID  . venlafaxine XR  225 mg Oral QHS   Continuous: .  ceFAZolin (ANCEF) IV     ZOX:WRUEAVWUJWJXBPRN:acetaminophen, calcium carbonate, cyclobenzaprine, HYDROmorphone (DILAUDID) injection, ibuprofen, lactulose, Melatonin, ondansetron (ZOFRAN) IV, oxyCODONE-acetaminophen, sodium phosphate, traZODone, zolpidem  Assessment/Plan:    Low back pain, acute on chronic; with radiculopathy Patient has had progression of her symptoms over the last few weeks.  She underwent epidural injection on May 31.  She is now  experiencing worsening radiculopathy and symptoms and signs concerning for neurological involvement.  Apparently had [place] recently in [place] which showed disc herniation.  Neurosurgery consulted.  Plan is for surgery on Tuesday.  Continue with dexamethasone for now.  Continue gabapentin.  Continue current pain medications.  Seems to be stable at this point in time.  Await surgery.  Skin wound and mild cellulitis in the lower abdominal skin Seen by wound care.  Placed on Keflex due to surrounding erythema.  Erythema has improved.  Continue with local treatment including bacitracin ointment.  Hyperglycemia Due to steroids.  No known history of diabetes.  HbA1c however 6.4.  Cut back on steroids after surgery.  Will need outpatient follow-up.  Constipation Most likely secondary to narcotics.  Continue laxatives.  She is noted to be on MiraLAX 3 times a day and lactulose as needed.  Enema as needed.  Patient did have a bowel movement yesterday.  Continue to watch for now.  No change to treatment today.  Hypothyroidism Continue Synthroid  History of anxiety and PTSD Continue home medications.  History of essential hypertension BP was borderline low initially.  Her antihypertensives were held.  Blood Pressure remains stable.  Morbid obesity Body mass index is 60.17 kg/m.  Followed by bariatric surgery with plans for intervention in the future.  DVT Prophylaxis: Change heparin to SCDs    Code Status: Full code Family Communication: Discussed with the patient Disposition Plan: Await surgery on Tuesday.    LOS: 4 days   Osvaldo ShipperGokul Osie Amparo  [facility] Pager [phone] 12/04/2017, 7:43 AM  If 7PM-7AM, please contact night-coverage at [email or web address], password [email or web address]

## 2017-12-04 NOTE — Plan of Care (Signed)
  Problem: Education: Goal: Knowledge of General Education information will improve Outcome: Progressing   Problem: Clinical Measurements: Goal: Ability to maintain clinical measurements within normal limits will improve Outcome: Progressing   Problem: Activity: Goal: Risk for activity intolerance will decrease Outcome: Progressing   Problem: Coping: Goal: Level of anxiety will decrease Outcome: Progressing   Problem: Pain Managment: Goal: General experience of comfort will improve Outcome: Progressing   

## 2017-12-04 NOTE — Progress Notes (Signed)
Pt seen and examined, ready for OR tomorrow, plan R L4-5 microdisk. She understands the risks include but are not limited to bleeding, infxn, csf leak , nerve injury, NTW, loss of BB fxn, need for further surgery, lack of relief, worsening, anesthesia risks, DVT, PE, MI, death. She agrees to proceed.

## 2017-12-04 NOTE — Progress Notes (Signed)
Inpatient Diabetes Program Recommendations  AACE/ADA: New Consensus Statement on Inpatient Glycemic Control (2019)  Target Ranges:  Prepandial:   less than 140 mg/dL      Peak postprandial:   less than 180 mg/dL (1-2 hours)      Critically ill patients:  140 - 180 mg/dL   Results for Rusty AusOVERBY, Madie E (MRN [id number]) as of 12/04/2017 09:16  Ref. Range 12/03/2017 08:11 12/03/2017 11:14 12/03/2017 16:09 12/03/2017 22:09 12/04/2017 00:51 12/04/2017 02:07 12/04/2017 07:35  Glucose-Capillary Latest Ref Range: 65 - 99 mg/dL 409227 (H) 811268 (H) 914303 (H) 267 (H) 347 (H) 292 (H) 186 (H)  Results for Rusty AusOVERBY, Manilla E (MRN [id number]) as of 12/04/2017 09:16  Ref. Range 12/04/2017 03:09  Hemoglobin A1C Latest Ref Range: 4.8 - 5.6 % 6.4 (H)   Review of Glycemic Control  Diabetes history: NO Outpatient Diabetes medications: NA Current orders for Inpatient glycemic control: Novolog 0-15 units TID with meals, Novolog 0-5 units QHS; Decadron 6 mg Q6H  Inpatient Diabetes Program Recommendations: Insulin - Basal: If steroids will be continued, please consider ordering Lantus 10 units Q24H. HgbA1C: A1C 6.4% on 12/04/17 indicating an average glucose of 137 mg/dl over the past 2-3 months. Noted in H&P, patient received steroid injection on 11/17/17 and patient has been ordered steroids since admitted which is contributing to A1C results.  Thanks, Orlando PennerMarie Kemisha Bonnette, RN, MSN, CDE Diabetes Coordinator Inpatient Diabetes Program [phone] (Team Pager from 8am to 5pm)

## 2017-12-05 ENCOUNTER — Inpatient Hospital Stay (HOSPITAL_COMMUNITY): Payer: Self-pay | Admitting: Certified Registered Nurse Anesthetist

## 2017-12-05 ENCOUNTER — Encounter (HOSPITAL_COMMUNITY): Payer: Self-pay | Admitting: Certified Registered Nurse Anesthetist

## 2017-12-05 ENCOUNTER — Encounter (HOSPITAL_COMMUNITY)
Admission: EM | Disposition: A | Discharge status: Home / Self Care | Payer: Self-pay | Source: Home / Self Care | Attending: Physician Assistant

## 2017-12-05 ENCOUNTER — Inpatient Hospital Stay (HOSPITAL_COMMUNITY): Payer: Self-pay

## 2017-12-05 DIAGNOSIS — Z9889 Other specified postprocedural states: Secondary | ICD-10-CM

## 2017-12-05 HISTORY — PX: LUMBAR LAMINECTOMY/DECOMPRESSION MICRODISCECTOMY: SHX5026

## 2017-12-05 HISTORY — PX: BACK SURGERY: SHX140

## 2017-12-05 LAB — GLUCOSE, CAPILLARY
GLUCOSE-CAPILLARY: 370 mg/dL — AB (ref 65–99)
GLUCOSE-CAPILLARY: 231 mg/dL — AB (ref 65–99)
GLUCOSE-CAPILLARY: 186 mg/dL — AB (ref 65–99)
GLUCOSE-CAPILLARY: 356 mg/dL — AB (ref 65–99)
Glucose-Capillary: 178 mg/dL — ABNORMAL HIGH (ref 65–99)
Glucose-Capillary: 351 mg/dL — ABNORMAL HIGH (ref 65–99)

## 2017-12-05 LAB — POCT PREGNANCY, URINE: Preg Test, Ur: NEGATIVE

## 2017-12-05 SURGERY — LUMBAR LAMINECTOMY/DECOMPRESSION MICRODISCECTOMY 1 LEVEL
Anesthesia: General | Site: Back | Laterality: Right

## 2017-12-05 MED ORDER — SODIUM CHLORIDE 0.9 % IV SOLN
INTRAVENOUS | Status: DC | PRN
  Administered 2017-12-05: 11:00:00

## 2017-12-05 MED ORDER — MIDAZOLAM HCL 2 MG/2ML IJ SOLN
INTRAMUSCULAR | Status: AC
  Filled 2017-12-05: qty 2

## 2017-12-05 MED ORDER — BUPIVACAINE HCL (PF) 0.25 % IJ SOLN
INTRAMUSCULAR | Status: DC | PRN
  Administered 2017-12-05: 6 mL

## 2017-12-05 MED ORDER — BUPIVACAINE HCL (PF) 0.25 % IJ SOLN
INTRAMUSCULAR | Status: AC
  Filled 2017-12-05: qty 30

## 2017-12-05 MED ORDER — OXYCODONE HCL 5 MG PO TABS
ORAL_TABLET | ORAL | Status: AC
  Filled 2017-12-05: qty 2

## 2017-12-05 MED ORDER — METHYLPREDNISOLONE ACETATE 80 MG/ML IJ SUSP
INTRAMUSCULAR | Status: AC
  Filled 2017-12-05: qty 1

## 2017-12-05 MED ORDER — MIDAZOLAM HCL 2 MG/2ML IJ SOLN
0.5000 mg | Freq: Once | INTRAMUSCULAR | Status: AC | PRN
  Administered 2017-12-05: 2 mg via INTRAVENOUS

## 2017-12-05 MED ORDER — SENNA 8.6 MG PO TABS
1.0000 | ORAL_TABLET | Freq: Two times a day (BID) | ORAL | Status: DC
  Administered 2017-12-05 – 2017-12-06 (×2): 8.6 mg via ORAL
  Filled 2017-12-05 (×3): qty 1

## 2017-12-05 MED ORDER — SCOPOLAMINE 1 MG/3DAYS TD PT72
MEDICATED_PATCH | TRANSDERMAL | Status: DC | PRN
  Administered 2017-12-05: 1 via TRANSDERMAL

## 2017-12-05 MED ORDER — ROCURONIUM BROMIDE 10 MG/ML (PF) SYRINGE
PREFILLED_SYRINGE | INTRAVENOUS | Status: DC | PRN
  Administered 2017-12-05: 30 mg via INTRAVENOUS
  Administered 2017-12-05: 20 mg via INTRAVENOUS
  Administered 2017-12-05: 50 mg via INTRAVENOUS

## 2017-12-05 MED ORDER — VANCOMYCIN HCL 1000 MG IV SOLR
INTRAVENOUS | Status: AC
  Filled 2017-12-05: qty 1000

## 2017-12-05 MED ORDER — DEXTROSE 5 % IV SOLN
3.0000 g | Freq: Once | INTRAVENOUS | Status: AC
  Administered 2017-12-05: 3 g via INTRAVENOUS
  Filled 2017-12-05: qty 3

## 2017-12-05 MED ORDER — HYDROMORPHONE HCL 2 MG/ML IJ SOLN
INTRAMUSCULAR | Status: AC
  Filled 2017-12-05: qty 1

## 2017-12-05 MED ORDER — ROCURONIUM BROMIDE 50 MG/5ML IV SOLN
INTRAVENOUS | Status: AC
  Filled 2017-12-05: qty 3

## 2017-12-05 MED ORDER — PROMETHAZINE HCL 25 MG/ML IJ SOLN: 6.2500 mg | INTRAMUSCULAR | Status: DC | PRN

## 2017-12-05 MED ORDER — ALBUTEROL SULFATE HFA 108 (90 BASE) MCG/ACT IN AERS
INHALATION_SPRAY | RESPIRATORY_TRACT | Status: AC
  Filled 2017-12-05: qty 6.7

## 2017-12-05 MED ORDER — THROMBIN 5000 UNITS EX SOLR
OROMUCOSAL | Status: DC | PRN
  Administered 2017-12-05: 11:00:00 via TOPICAL

## 2017-12-05 MED ORDER — MIDAZOLAM HCL 5 MG/5ML IJ SOLN
INTRAMUSCULAR | Status: DC | PRN
  Administered 2017-12-05: 2 mg via INTRAVENOUS

## 2017-12-05 MED ORDER — LIDOCAINE 2% (20 MG/ML) 5 ML SYRINGE
INTRAMUSCULAR | Status: AC
  Filled 2017-12-05: qty 5

## 2017-12-05 MED ORDER — DEXAMETHASONE SODIUM PHOSPHATE 10 MG/ML IJ SOLN
INTRAMUSCULAR | Status: AC
  Filled 2017-12-05: qty 1

## 2017-12-05 MED ORDER — DEXAMETHASONE SODIUM PHOSPHATE 10 MG/ML IJ SOLN
INTRAMUSCULAR | Status: DC | PRN
  Administered 2017-12-05: 10 mg via INTRAVENOUS

## 2017-12-05 MED ORDER — INSULIN ASPART 100 UNIT/ML ~~LOC~~ SOLN
0.0000 [IU] | Freq: Three times a day (TID) | SUBCUTANEOUS | Status: DC
  Administered 2017-12-05 – 2017-12-06 (×2): 3 [IU] via SUBCUTANEOUS
  Administered 2017-12-06: 8 [IU] via SUBCUTANEOUS
  Administered 2017-12-06: 5 [IU] via SUBCUTANEOUS

## 2017-12-05 MED ORDER — PROPOFOL 10 MG/ML IV BOLUS
INTRAVENOUS | Status: AC
  Filled 2017-12-05: qty 20

## 2017-12-05 MED ORDER — VANCOMYCIN HCL 1000 MG IV SOLR
INTRAVENOUS | Status: DC | PRN
  Administered 2017-12-05: 1000 mg via TOPICAL

## 2017-12-05 MED ORDER — SODIUM CHLORIDE 0.9 % IV SOLN
250.0000 mL | INTRAVENOUS | Status: DC
  Administered 2017-12-05: 250 mL via INTRAVENOUS

## 2017-12-05 MED ORDER — POTASSIUM CHLORIDE IN NACL 20-0.9 MEQ/L-% IV SOLN: INTRAVENOUS | Status: DC

## 2017-12-05 MED ORDER — ACETAMINOPHEN 325 MG PO TABS: 650.0000 mg | ORAL_TABLET | ORAL | Status: DC | PRN

## 2017-12-05 MED ORDER — CELECOXIB 200 MG PO CAPS
200.0000 mg | ORAL_CAPSULE | Freq: Two times a day (BID) | ORAL | Status: DC
  Administered 2017-12-05 – 2017-12-06 (×3): 200 mg via ORAL
  Filled 2017-12-05 (×3): qty 1

## 2017-12-05 MED ORDER — THROMBIN 5000 UNITS EX SOLR
CUTANEOUS | Status: AC
  Filled 2017-12-05: qty 5000

## 2017-12-05 MED ORDER — MEPERIDINE HCL 50 MG/ML IJ SOLN: 6.2500 mg | INTRAMUSCULAR | Status: DC | PRN

## 2017-12-05 MED ORDER — SUCCINYLCHOLINE CHLORIDE 200 MG/10ML IV SOSY
PREFILLED_SYRINGE | INTRAVENOUS | Status: AC
  Filled 2017-12-05: qty 10

## 2017-12-05 MED ORDER — SUCCINYLCHOLINE CHLORIDE 200 MG/10ML IV SOSY
PREFILLED_SYRINGE | INTRAVENOUS | Status: DC | PRN
  Administered 2017-12-05: 180 mg via INTRAVENOUS

## 2017-12-05 MED ORDER — ALBUTEROL SULFATE HFA 108 (90 BASE) MCG/ACT IN AERS
INHALATION_SPRAY | RESPIRATORY_TRACT | Status: DC | PRN
  Administered 2017-12-05: 4 via RESPIRATORY_TRACT

## 2017-12-05 MED ORDER — OXYCODONE HCL 5 MG PO TABS
10.0000 mg | ORAL_TABLET | ORAL | Status: DC | PRN
  Administered 2017-12-05 – 2017-12-06 (×7): 10 mg via ORAL
  Filled 2017-12-05 (×6): qty 2

## 2017-12-05 MED ORDER — FENTANYL CITRATE (PF) 250 MCG/5ML IJ SOLN
INTRAMUSCULAR | Status: AC
  Filled 2017-12-05: qty 5

## 2017-12-05 MED ORDER — PROPOFOL 10 MG/ML IV BOLUS
INTRAVENOUS | Status: DC | PRN
Start: 2017-12-05 — End: 2017-12-05
  Administered 2017-12-05: 200 mg via INTRAVENOUS

## 2017-12-05 MED ORDER — HYDROMORPHONE HCL 2 MG/ML IJ SOLN
0.2500 mg | INTRAMUSCULAR | Status: DC | PRN
  Administered 2017-12-05: 1 mg via INTRAVENOUS
  Administered 2017-12-05: 0.5 mg via INTRAVENOUS

## 2017-12-05 MED ORDER — ONDANSETRON HCL 4 MG/2ML IJ SOLN
INTRAMUSCULAR | Status: AC
  Filled 2017-12-05: qty 2

## 2017-12-05 MED ORDER — SUGAMMADEX SODIUM 500 MG/5ML IV SOLN
INTRAVENOUS | Status: DC | PRN
  Administered 2017-12-05: 500 mg via INTRAVENOUS

## 2017-12-05 MED ORDER — SCOPOLAMINE 1 MG/3DAYS TD PT72
MEDICATED_PATCH | TRANSDERMAL | Status: AC
  Filled 2017-12-05: qty 1

## 2017-12-05 MED ORDER — LIDOCAINE 2% (20 MG/ML) 5 ML SYRINGE
INTRAMUSCULAR | Status: DC | PRN
  Administered 2017-12-05: 40 mg via INTRAVENOUS

## 2017-12-05 MED ORDER — ONDANSETRON HCL 4 MG/2ML IJ SOLN
4.0000 mg | Freq: Four times a day (QID) | INTRAMUSCULAR | Status: DC | PRN
  Administered 2017-12-05: 4 mg via INTRAVENOUS
  Filled 2017-12-05: qty 2

## 2017-12-05 MED ORDER — ONDANSETRON HCL 4 MG PO TABS: 4.0000 mg | ORAL_TABLET | Freq: Four times a day (QID) | ORAL | Status: DC | PRN

## 2017-12-05 MED ORDER — 0.9 % SODIUM CHLORIDE (POUR BTL) OPTIME
TOPICAL | Status: DC | PRN
  Administered 2017-12-05: 1000 mL

## 2017-12-05 MED ORDER — LACTATED RINGERS IV SOLN
INTRAVENOUS | Status: DC
  Administered 2017-12-05: 11:00:00 via INTRAVENOUS

## 2017-12-05 MED ORDER — METHYLPREDNISOLONE ACETATE 80 MG/ML IJ SUSP
INTRAMUSCULAR | Status: DC | PRN
  Administered 2017-12-05: 80 mg

## 2017-12-05 MED ORDER — SUGAMMADEX SODIUM 500 MG/5ML IV SOLN
INTRAVENOUS | Status: AC
  Filled 2017-12-05: qty 5

## 2017-12-05 MED ORDER — SODIUM CHLORIDE 0.9% FLUSH
3.0000 mL | Freq: Two times a day (BID) | INTRAVENOUS | Status: DC
  Administered 2017-12-05 – 2017-12-06 (×2): 3 mL via INTRAVENOUS

## 2017-12-05 MED ORDER — PHENOL 1.4 % MT LIQD: 1.0000 | OROMUCOSAL | Status: DC | PRN

## 2017-12-05 MED ORDER — MENTHOL 3 MG MT LOZG: 1.0000 | LOZENGE | OROMUCOSAL | Status: DC | PRN

## 2017-12-05 MED ORDER — CEFAZOLIN SODIUM-DEXTROSE 2-4 GM/100ML-% IV SOLN
2.0000 g | Freq: Three times a day (TID) | INTRAVENOUS | Status: AC
  Administered 2017-12-05 – 2017-12-06 (×2): 2 g via INTRAVENOUS
  Filled 2017-12-05 (×2): qty 100

## 2017-12-05 MED ORDER — CEPHALEXIN 500 MG PO CAPS
500.0000 mg | ORAL_CAPSULE | Freq: Four times a day (QID) | ORAL | Status: DC
  Administered 2017-12-06 (×2): 500 mg via ORAL
  Filled 2017-12-05 (×2): qty 1

## 2017-12-05 MED ORDER — SODIUM CHLORIDE 0.9% FLUSH: 3.0000 mL | INTRAVENOUS | Status: DC | PRN

## 2017-12-05 MED ORDER — HYDROMORPHONE HCL 2 MG/ML IJ SOLN
0.5000 mg | INTRAMUSCULAR | Status: DC | PRN
  Administered 2017-12-05 – 2017-12-06 (×3): 0.5 mg via INTRAVENOUS
  Filled 2017-12-05 (×3): qty 1

## 2017-12-05 MED ORDER — ACETAMINOPHEN 650 MG RE SUPP: 650.0000 mg | RECTAL | Status: DC | PRN

## 2017-12-05 MED ORDER — FENTANYL CITRATE (PF) 100 MCG/2ML IJ SOLN
INTRAMUSCULAR | Status: DC | PRN
  Administered 2017-12-05: 100 ug via INTRAVENOUS
  Administered 2017-12-05 (×3): 50 ug via INTRAVENOUS

## 2017-12-05 SURGICAL SUPPLY — 47 items
APL SKNCLS STERI-STRIP NONHPOA (GAUZE/BANDAGES/DRESSINGS) ×1
BAG DECANTER FOR FLEXI CONT (MISCELLANEOUS) ×3 IMPLANT
BENZOIN TINCTURE PRP APPL 2/3 (GAUZE/BANDAGES/DRESSINGS) ×3 IMPLANT
BUR MATCHSTICK NEURO 3.0 LAGG (BURR) ×3 IMPLANT
CANISTER SUCT 3000ML PPV (MISCELLANEOUS) ×3 IMPLANT
CARTRIDGE OIL MAESTRO DRILL (MISCELLANEOUS) ×1 IMPLANT
CLOSURE WOUND 1/2 X4 (GAUZE/BANDAGES/DRESSINGS) ×1
DIFFUSER DRILL AIR PNEUMATIC (MISCELLANEOUS) ×3 IMPLANT
DRAPE LAPAROTOMY 100X72X124 (DRAPES) ×3 IMPLANT
DRAPE MICROSCOPE LEICA (MISCELLANEOUS) ×3 IMPLANT
DRAPE POUCH INSTRU U-SHP 10X18 (DRAPES) ×3 IMPLANT
DRAPE SURG 17X23 STRL (DRAPES) ×3 IMPLANT
DRSG OPSITE POSTOP 4X6 (GAUZE/BANDAGES/DRESSINGS) ×3 IMPLANT
DURAPREP 26ML APPLICATOR (WOUND CARE) ×3 IMPLANT
ELECT REM PT RETURN 9FT ADLT (ELECTROSURGICAL) ×3
ELECTRODE REM PT RTRN 9FT ADLT (ELECTROSURGICAL) ×1 IMPLANT
GAUZE SPONGE 4X4 16PLY XRAY LF (GAUZE/BANDAGES/DRESSINGS) IMPLANT
GLOVE BIO SURGEON STRL SZ7 (GLOVE) IMPLANT
GLOVE BIO SURGEON STRL SZ8 (GLOVE) ×3 IMPLANT
GLOVE BIOGEL PI IND STRL 7.0 (GLOVE) IMPLANT
GLOVE BIOGEL PI INDICATOR 7.0 (GLOVE)
GOWN STRL REUS W/ TWL LRG LVL3 (GOWN DISPOSABLE) IMPLANT
GOWN STRL REUS W/ TWL XL LVL3 (GOWN DISPOSABLE) ×1 IMPLANT
GOWN STRL REUS W/TWL 2XL LVL3 (GOWN DISPOSABLE) IMPLANT
GOWN STRL REUS W/TWL LRG LVL3 (GOWN DISPOSABLE)
GOWN STRL REUS W/TWL XL LVL3 (GOWN DISPOSABLE) ×3
HEMOSTAT POWDER KIT SURGIFOAM (HEMOSTASIS) IMPLANT
KIT BASIN OR (CUSTOM PROCEDURE TRAY) ×3 IMPLANT
KIT TURNOVER KIT B (KITS) ×3 IMPLANT
NDL HYPO 25X1 1.5 SAFETY (NEEDLE) ×1 IMPLANT
NDL SPNL 20GX3.5 QUINCKE YW (NEEDLE) IMPLANT
NEEDLE HYPO 25X1 1.5 SAFETY (NEEDLE) ×3 IMPLANT
NEEDLE SPNL 20GX3.5 QUINCKE YW (NEEDLE) IMPLANT
NS IRRIG 1000ML POUR BTL (IV SOLUTION) ×3 IMPLANT
OIL CARTRIDGE MAESTRO DRILL (MISCELLANEOUS) ×3
PACK LAMINECTOMY NEURO (CUSTOM PROCEDURE TRAY) ×3 IMPLANT
PAD ARMBOARD 7.5X6 YLW CONV (MISCELLANEOUS) ×9 IMPLANT
RUBBERBAND STERILE (MISCELLANEOUS) ×6 IMPLANT
SPONGE SURGIFOAM ABS GEL SZ50 (HEMOSTASIS) IMPLANT
STRIP CLOSURE SKIN 1/2X4 (GAUZE/BANDAGES/DRESSINGS) ×2 IMPLANT
SUT VIC AB 0 CT1 18XCR BRD8 (SUTURE) ×1 IMPLANT
SUT VIC AB 0 CT1 8-18 (SUTURE) ×3
SUT VIC AB 2-0 CP2 18 (SUTURE) ×3 IMPLANT
SUT VIC AB 3-0 SH 8-18 (SUTURE) ×3 IMPLANT
TOWEL GREEN STERILE (TOWEL DISPOSABLE) ×3 IMPLANT
TOWEL GREEN STERILE FF (TOWEL DISPOSABLE) ×3 IMPLANT
WATER STERILE IRR 1000ML POUR (IV SOLUTION) ×3 IMPLANT

## 2017-12-05 NOTE — Plan of Care (Signed)
  Problem: Education: Goal: Knowledge of General Education information will improve Outcome: Progressing   Problem: Clinical Measurements: Goal: Will remain free from infection Outcome: Progressing   Problem: Activity: Goal: Risk for activity intolerance will decrease Outcome: Progressing   Problem: Coping: Goal: Level of anxiety will decrease Outcome: Progressing   Problem: Elimination: Goal: Will not experience complications related to bowel motility Outcome: Progressing   Problem: Pain Managment: Goal: General experience of comfort will improve Outcome: Progressing   

## 2017-12-05 NOTE — Anesthesia Preprocedure Evaluation (Signed)
Anesthesia Evaluation  Patient identified by MRN, date of birth, ID band Patient awake    Reviewed: Allergy & Precautions, NPO status , Patient's Chart, lab work & pertinent test results  History of Anesthesia Complications Negative for: history of anesthetic complications  Airway Mallampati: II  TM Distance: >3 FB Neck ROM: Full    Dental  (+) Dental Advisory Given   Pulmonary neg pulmonary ROS,    breath sounds clear to auscultation       Cardiovascular hypertension, Pt. on medications (-) angina Rhythm:Regular Rate:Normal     Neuro/Psych Anxiety Depression Back pain: narcotics    GI/Hepatic Neg liver ROS, GERD  Poorly Controlled,Nausea and vomiting today   Endo/Other  Hypothyroidism Morbid obesityGlu 178, receiving insulin since hospitalized  Renal/GU negative Renal ROS     Musculoskeletal   Abdominal (+) + obese,   Peds  Hematology   Anesthesia Other Findings   Reproductive/Obstetrics LMP presently                             Anesthesia Physical Anesthesia Plan  ASA: III  Anesthesia Plan: General   Post-op Pain Management:    Induction: Intravenous and Rapid sequence  PONV Risk Score and Plan: 4 or greater and Scopolamine patch - Pre-op, Ondansetron and Dexamethasone  Airway Management Planned: Oral ETT  Additional Equipment:   Intra-op Plan:   Post-operative Plan: Extubation in OR  Informed Consent: I have reviewed the patients History and Physical, chart, labs and discussed the procedure including the risks, benefits and alternatives for the proposed anesthesia with the patient or authorized representative who has indicated his/her understanding and acceptance.   Dental advisory given  Plan Discussed with: CRNA and Surgeon  Anesthesia Plan Comments: (Plan routine monitors, GETA)        Anesthesia Quick Evaluation

## 2017-12-05 NOTE — Progress Notes (Signed)
[facility] PROGRESS NOTE  Sonya Gibson WUJ:[id number] DOB: 02/27/1986 DOA: 11/30/2017  PCP: Hoy RegisterNewlin, Enobong, MD  Brief History/Interval Summary: 32 year old Caucasian female with a past medical history of morbid obesity, GERD, hypertension, PTSD, depression with history of chronic back pain followed by spinal specialist in [place].  Has experienced a worsening symptoms in the last few weeks.  She had an epidural injection on May 31.  She went to see her spine specialist last week and had an MRI done at that time which showed that there was disc herniation.  Patient [place] in [place].  However she started experiencing worsening symptoms in her right leg with weakness along with urinary incontinence.  She presented to the emergency department and was hospitalized.  Seen by neurosurgery.  Plan is for surgery today.  Reason for Visit: Lower back pain with radiculopathy  Consultants: Neurosurgery  Procedures: Lumbar surgery planned for today  Antibiotics: Keflex started on 6/14 for lower abdominal wall skin infection  Subjective/Interval History: Patient continues to have back pain with no appreciable change so far.  Some pain over the wound site on her lower abdomen.  Some nausea.  No vomiting.      ROS: Denies any headaches  Objective:  Vital Signs  Vitals:   12/04/17 0421 12/04/17 1641 12/04/17 2017 12/05/17 0342  BP: 106/74 123/78 (!) 133/91 132/83  Pulse: 87 89 82 80  Resp: 20 17 16 17   Temp: 98.1 F (36.7 C) 98.3 F (36.8 C) 97.9 F (36.6 C)   TempSrc: Oral Oral Oral   SpO2: 94% 96% 96% 95%  Weight:      Height:        Intake/Output Summary (Last 24 hours) at 12/05/2017 0919 Last data filed at 12/04/2017 2023 Gross per 24 hour  Intake 1680 ml  Output -  Net 1680 ml   Filed Weights   11/30/17 1931  Weight: (!) 149.2 kg (329 lb)    General appearance: Awake alert.  In no distress Resp: Clear to auscultation bilaterally.  No  wheezing or rhonchi Cardio: S1S2 Remains normal regular.  No S3-S4.  No rubs murmurs or bruit GI: Abdomen is obese.  Nontender nondistended.  Skin wound of the lower abdomen is stable with yellowish exudate.  Improved surrounding erythema.   Neurologic: Right lower extremity weakness due to radiculopathy  Lab Results:  Data Reviewed: I have personally reviewed following labs and imaging studies  CBC: Recent Labs  Lab 11/30/17 1715 12/01/17 0500 12/04/17 0309  WBC 10.9* 11.0* 10.6*  NEUTROABS 6.5  --   --   HGB 11.8* 12.1 11.1*  HCT 38.0 38.7 35.6*  MCV 93.8 92.1 93.0  PLT 317 297 274    Basic Metabolic Panel: Recent Labs  Lab 11/30/17 1715 12/01/17 0500 12/04/17 0309  NA 138 134* 136  K 4.3 4.7 4.4  CL 98* 100* 102  CO2 30 26 24   GLUCOSE 107* 184* 334*  BUN 13 15 14   CREATININE 0.86 0.89 0.94  CALCIUM 9.9 8.9 8.6*    GFR: Estimated Creatinine Clearance: 121.7 mL/min (by C-G formula based on SCr of 0.94 mg/dL).   Radiology Studies: No results found.   Medications:  Scheduled: . cephALEXin  500 mg Oral Q6H  . clonazePAM  2 mg Oral BID  . dexamethasone  6 mg Oral Q6H  . fluconazole  100 mg Oral Daily  . gabapentin  600 mg Oral TID  . insulin aspart  0-15 Units Subcutaneous TID WC  .  insulin aspart  0-5 Units Subcutaneous QHS  . levothyroxine  50 mcg Oral QHS  . neomycin-bacitracin-polymyxin   Topical BID  . polyethylene glycol  17 g Oral TID  . senna-docusate  1 tablet Oral BID  . venlafaxine XR  225 mg Oral QHS   Continuous:  ZOX:WRUEAVWUJWJXB, calcium carbonate, cyclobenzaprine, HYDROmorphone (DILAUDID) injection, ibuprofen, lactulose, Melatonin, ondansetron (ZOFRAN) IV, oxyCODONE-acetaminophen, sodium phosphate, traZODone, zolpidem  Assessment/Plan:    Low back pain, acute on chronic; with radiculopathy Patient has had progression of her symptoms over the last few weeks.  She underwent epidural injection on May 31.  She is now experiencing  worsening radiculopathy and symptoms and signs concerning for neurological involvement.  Apparently had MRI recently in [place] which showed disc herniation.  He was consulted.  Surgery is planned for today.  Continue dexamethasone for now.  Anticipate that we should be able to taper it quickly after surgery.  Continue gabapentin.  Continue current pain medications.    Skin wound and mild cellulitis in the lower abdominal skin Seen by wound care.  Placed on Keflex due to surrounding erythema.  Erythema has improved.  Continue with local treatment including bacitracin ointment.  Stable.  Hyperglycemia Due to steroids.  No known history of diabetes.  HbA1c however 6.4.  Cut back on steroids after surgery.  Will need outpatient follow-up.  Constipation Most likely secondary to narcotics.  Continue laxatives.  She is noted to be on MiraLAX 3 times a day and lactulose as needed.  Enema as needed.  Patient did have a bowel movement the last 2 days.  Continue to watch for now.   Hypothyroidism Continue Synthroid  History of anxiety and PTSD Continue home medications.  History of essential hypertension BP was borderline low initially.  Her antihypertensives were held.  Blood pressure has been stable.  Continue to watch.  Morbid obesity Body mass index is 60.17 kg/m.  Followed by bariatric surgery with plans for intervention in the future.  DVT Prophylaxis: SCDs    Code Status: Full code Family Communication: Discussed with the patient Disposition Plan: Surgery today.    LOS: 5 days   Osvaldo Shipper  [facility] Pager [phone] 12/05/2017, 9:19 AM  If 7PM-7AM, please contact night-coverage at [email or web address], password [facility]

## 2017-12-05 NOTE — Op Note (Signed)
12/05/2017  1:09 PM  PATIENT:  Sonya Gibson  32 y.o. 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  BLOOD ADMINISTERED: none  DRAINS: None  SPECIMEN:  none  INDICATION FOR PROCEDURE: This patient presented with severe right leg pain with numbness tingling and weakness in an L5 distribution. Imaging showed right L4-5 disc herniation with inferior free fragment. The patient tried conservative measures without relief. Pain was debilitating. Recommended right L4-5 microdiscectomy. Patient understood the risks, benefits, and alternatives and potential outcomes and wished to proceed.  PROCEDURE DETAILS: The patient was taken to the operating room and after induction of adequate generalized endotracheal anesthesia, the patient was rolled into the prone position on the Wilson frame and all pressure points were padded. The lumbar region was cleaned and then prepped with DuraPrep and draped in the usual sterile fashion. 5 cc of local anesthesia was injected and then a large dorsal midline incision was made and carried down to the lumbo sacral fascia. The fascia was opened and the paraspinous musculature was taken down in a subperiosteal fashion to expose L4-5 on the right. Intraoperative x-ray confirmed my level, and then I placed a 100 mm self-retaining retractor and I used a combination of the high-speed drill and the Kerrison punches to perform a hemilaminectomy, medial facetectomy, and foraminotomy at L4-5 on the right. The underlying yellow ligament was opened and removed in a piecemeal fashion to expose the underlying dura and  exiting nerve root. I undercut the lateral recess and dissected down until I was medial to and distal to the pedicle. The nerve root was well decompressed. We then gently retracted the nerve root medially with a retractor, coagulated the epidural venous vasculature, and found a large inferior free fragment sticking out lateral to the dura. I used a nerve hook to tease this out of the epidural space under the L5 nerve root.. I then palpated with a coronary dilator along the nerve root and into the foramen to assure adequate decompression. I felt no more compression of the nerve root. I irrigated with saline solution containing bacitracin. Achieved hemostasis with bipolar cautery, lined the dura with Depo-Medrol, and then closed the fascia with 0 Vicryl. I placed powdered vancomycin into the wound because of her body habitus. I closed the subcutaneous tissues in 2 layers with 2-0 Vicryl and the subcuticular tissues with 3-0 Vicryl. The skin was then closed with benzoin and Steri-Strips. The drapes were removed, a sterile dressing was applied. The patient was awakened from general anesthesia and transferred to the recovery room in stable condition. At the end of the procedure all sponge, needle and instrument counts were correct.    PLAN OF CARE: Admit to inpatient   PATIENT DISPOSITION:  PACU - hemodynamically stable.   Delay start of Pharmacological VTE agent (>24hrs) due to surgical blood loss or risk of bleeding:  yes

## 2017-12-05 NOTE — Evaluation (Signed)
Physical Therapy Evaluation Patient Details Name: Sonya Gibson MRN: [id number] DOB: 09/10/1985 Today's Date: 12/05/2017   History of Present Illness  Pt is a 32 y/o female presenting with increased back pain after climbing into truck. Imaging revealed disk protrusions throughout lumbar spine with some impingement on L5. PMH includes HTN, PTSD, anxiety, and depression. s/p l4-L5 microdiskectomy 12/05/17  Clinical Impression  Patient is s/p above surgery resulting in functional limitations due to the deficits listed below (see PT Problem List). Pt educated in back precautions. Pt currently min guard for bed mobility, supervision for transfers and min guard for ambulation of 18 feet with RW.  Patient will benefit from skilled PT to increase their independence and safety with mobility to allow discharge to the venue listed below.       Follow Up Recommendations No PT follow up;Supervision for mobility/OOB    Equipment Recommendations  None recommended by PT       Precautions / Restrictions Precautions Precautions: Back Precaution Booklet Issued: Yes (comment) Precaution Comments: reviewed precautions and discussed how to maintain them with ADLs Restrictions Weight Bearing Restrictions: No      Mobility  Bed Mobility Overal bed mobility: Needs Assistance       Supine to sit: Min guard     General bed mobility comments: prefers to come to long sitting with HoB elevated and walk LE across bed to maintain back alignment, can not tolerate laying flat secondary to anxiety   Transfers Overall transfer level: Needs assistance Equipment used: Rolling walker (2 wheeled) Transfers: Sit to/from Stand Sit to Stand: Supervision         General transfer comment: supervision from EoB, to RW, utilizes hand rail beside toilet to lower and raise from toilet  Ambulation/Gait Ambulation/Gait assistance: Min guard Gait Distance (Feet): 18 Feet Assistive device: Rolling walker (2  wheeled) Gait Pattern/deviations: Step-through pattern;Decreased stride length;Antalgic Gait velocity: decreased Gait velocity interpretation: 1.31 - 2.62 ft/sec, indicative of limited community ambulator General Gait Details: overall slow and steady, limited by fatigue s/p surgery and returned from bathroom to sit in recliner      Balance Overall balance assessment: Needs assistance Sitting-balance support: No upper extremity supported;Feet supported Sitting balance-Leahy Scale: Fair     Standing balance support: Bilateral upper extremity supported;During functional activity Standing balance-Leahy Scale: Fair                               Pertinent Vitals/Pain Pain Assessment: 0-10 Pain Score: 8  Pain Location: low back Pain Descriptors / Indicators: Aching;Sore Pain Intervention(s): Limited activity within patient's tolerance;Monitored during session;Patient requesting pain meds-RN notified    Home Living Family/patient expects to be discharged to:: Private residence Living Arrangements: Spouse/significant other;Children Available Help at Discharge: Family;Available PRN/intermittently Type of Home: House Home Access: Ramped entrance     Home Layout: One level Home Equipment: Walker - 2 wheels;Bedside commode      Prior Function Level of Independence: Independent with assistive device(s)               Hand Dominance        Extremity/Trunk Assessment   Upper Extremity Assessment Upper Extremity Assessment: Defer to OT evaluation    Lower Extremity Assessment Lower Extremity Assessment: RLE deficits/detail RLE Sensation: decreased light touch(sensation has returned to R LE however is diminished in comp)    Cervical / Trunk Assessment Cervical / Trunk Assessment: Other exceptions(back pain)  Communication   Communication:  No difficulties  Cognition Arousal/Alertness: Awake/alert Behavior During Therapy: Anxious Overall Cognitive Status:  Within Functional Limits for tasks assessed                                        General Comments General comments (skin integrity, edema, etc.): VSS, pt husband present during session        Assessment/Plan    PT Assessment Patient needs continued PT services  PT Problem List Decreased activity tolerance;Decreased mobility;Pain       PT Treatment Interventions DME instruction;Gait training;Stair training;Functional mobility training;Therapeutic activities;Therapeutic exercise;Balance training;Patient/family education    PT Goals (Current goals can be found in the Care Plan section)  Acute Rehab PT Goals Patient Stated Goal: get back to normal PT Goal Formulation: With patient Time For Goal Achievement: 12/19/17 Potential to Achieve Goals: Good    Frequency Min 5X/week    AM-PAC PT "6 Clicks" Daily Activity  Outcome Measure Difficulty turning over in bed (including adjusting bedclothes, sheets and blankets)?: A Little Difficulty moving from lying on back to sitting on the side of the bed? : A Little Difficulty sitting down on and standing up from a chair with arms (e.g., wheelchair, bedside commode, etc,.)?: A Little Help needed moving to and from a bed to chair (including a wheelchair)?: A Little Help needed walking in hospital room?: A Little Help needed climbing 3-5 steps with a railing? : A Lot 6 Click Score: 17    End of Session Equipment Utilized During Treatment: Gait belt Activity Tolerance: Patient limited by pain Patient left: in chair;with call bell/phone within reach;with family/visitor present;with nursing/sitter in room Nurse Communication: Mobility status PT Visit Diagnosis: Pain;Other abnormalities of gait and mobility (R26.89);Difficulty in walking, not elsewhere classified (R26.2);Muscle weakness (generalized) (M62.81) Pain - part of body: (low back )    Time: 2956-2130 PT Time Calculation (min) (ACUTE ONLY): 30 min   Charges:      PT Treatments $Therapeutic Activity: 8-22 mins   PT G Codes:        Aceson Labell B. Beverely Risen PT, DPT Acute Rehabilitation  [phone] Pager [phone]    [facility] 12/05/2017, 4:48 PM

## 2017-12-05 NOTE — Transfer of Care (Signed)
Immediate Anesthesia Transfer of Care Note  Patient: Sonya Gibson  Procedure(s) Performed: Right L4-5 Microdiskectomy (Right Back)  Patient Location: PACU  Anesthesia Type:General  Level of Consciousness: patient cooperative and responds to stimulation  Airway & Oxygen Therapy: Patient Spontanous Breathing and Patient connected to face mask oxygen  Post-op Assessment: Report given to RN and Post -op Vital signs reviewed and stable  Post vital signs: Reviewed and stable  Last Vitals:  Vitals Value Taken Time  BP 152/105 12/05/2017  1:08 PM  Temp    Pulse 85 12/05/2017  1:22 PM  Resp 14 12/05/2017  1:22 PM  SpO2 96 % 12/05/2017  1:22 PM  Vitals shown include unvalidated device data.  Last Pain:  Vitals:   12/05/17 0825  TempSrc:   PainSc: 9       Patients Stated Pain Goal: 3 (12/05/17 [id number])  Complications: No apparent anesthesia complications

## 2017-12-05 NOTE — Anesthesia Postprocedure Evaluation (Signed)
Anesthesia Post Note  Patient: Sonya Gibson  Procedure(s) Performed: Right L4-5 Microdiskectomy (Right Back)     Patient location during evaluation: PACU Anesthesia Type: General Level of consciousness: awake and alert, oriented and patient cooperative Pain management: pain level controlled Vital Signs Assessment: post-procedure vital signs reviewed and stable Respiratory status: spontaneous breathing, nonlabored ventilation, respiratory function stable and patient connected to nasal cannula oxygen Cardiovascular status: blood pressure returned to baseline and stable Postop Assessment: no apparent nausea or vomiting Anesthetic complications: no Comments: Pt groggy, received Sugammadex and uses OCPs. PACU staff to provide info regarding ineffectiveness of OCPs for 2 weeks post op.     Last Vitals:  Vitals:   12/05/17 1325 12/05/17 1443  BP: (!) 141/93 (!) 142/89  Pulse: 84 66  Resp: 12 16  Temp:  (!) 36.4 C  SpO2: 90% 99%    Last Pain:  Vitals:   12/05/17 1443  TempSrc: Oral  PainSc:                  Landen Knoedler,E. Ricahrd Schwager

## 2017-12-05 NOTE — Progress Notes (Signed)
NEUROSURGERY PROGRESS NOTE  Doing well. Complains of back pain. Answered all concerns and questions regarding surgery. She understand the risks and benefits associated with it and agrees to move forward   Temp:  [97.9 F (36.6 C)-98.3 F (36.8 C)] 97.9 F (36.6 C) (06/17 2017) Pulse Rate:  [80-89] 80 (06/18 0342) Resp:  [16-17] 17 (06/18 0342) BP: (123-133)/(78-91) 132/83 (06/18 0342) SpO2:  [95 %-96 %] 95 % (06/18 0342)    Sherryl MangesKimberly Hannah Tyrah Broers, NP 12/05/2017 10:22 AM

## 2017-12-05 NOTE — Progress Notes (Signed)
Inpatient Diabetes Program Recommendations  AACE/ADA: New Consensus Statement on Inpatient Glycemic Control (2019)  Target Ranges:  Prepandial:   less than 140 mg/dL      Peak postprandial:   less than 180 mg/dL (1-2 hours)      Critically ill patients:  140 - 180 mg/dL  Results for Sonya Gibson, Sonya Gibson (MRN [id number]) as of 12/05/2017 11:56  Ref. Range 12/04/2017 12:07 12/04/2017 16:39 12/04/2017 23:09 12/05/2017 06:32 12/05/2017 11:11  Glucose-Capillary Latest Ref Range: 65 - 99 mg/dL 409307 (H) 811260 (H) 914370 (H) 231 (H) 178 (H)   Results for Sonya Gibson, Sonya Gibson (MRN [id number]) as of 12/04/2017 09:16  Ref. Range 12/03/2017 08:11 12/03/2017 11:14 12/03/2017 16:09 12/03/2017 22:09 12/04/2017 00:51 12/04/2017 02:07 12/04/2017 07:35  Glucose-Capillary Latest Ref Range: 65 - 99 mg/dL 086227 (H) 578268 (H) 469303 (H) 267 (H) 347 (H) 292 (H) 186 (H)  Results for Sonya Gibson, Sonya Gibson (MRN [id number]) as of 12/04/2017 09:16  Ref. Range 12/04/2017 03:09  Hemoglobin A1C Latest Ref Range: 4.8 - 5.6 % 6.4 (H)   Review of Glycemic Control  Diabetes history: NO Outpatient Diabetes medications: NA Current orders for Inpatient glycemic control: Novolog 0-15 units TID with meals, Novolog 0-5 units QHS; Decadron 6 mg Q6H  Inpatient Diabetes Program Recommendations: Insulin - Basal: If steroids will be continued, please consider ordering Lantus 10 units Q24H. HgbA1C: A1C 6.4% on 12/04/17 indicating an average glucose of 137 mg/dl over the past 2-3 months. Noted in H&P, patient received steroid injection on 11/17/17 and patient has been ordered steroids since admitted which is contributing to A1C results.  Thanks, Orlando PennerMarie Zanasia Hickson, RN, MSN, CDE Diabetes Coordinator Inpatient Diabetes Program [phone] (Team Pager from 8am to 5pm)

## 2017-12-06 ENCOUNTER — Encounter (HOSPITAL_COMMUNITY): Payer: Self-pay | Admitting: Neurological Surgery

## 2017-12-06 DIAGNOSIS — M5416 Radiculopathy, lumbar region: Secondary | ICD-10-CM

## 2017-12-06 LAB — GLUCOSE, CAPILLARY
Glucose-Capillary: 234 mg/dL — ABNORMAL HIGH (ref 65–99)
Glucose-Capillary: 244 mg/dL — ABNORMAL HIGH (ref 65–99)
Glucose-Capillary: 220 mg/dL — ABNORMAL HIGH (ref 65–99)
Glucose-Capillary: 270 mg/dL — ABNORMAL HIGH (ref 65–99)

## 2017-12-06 MED ORDER — OXYCODONE HCL 5 MG PO TABS: 5.0000 mg | ORAL_TABLET | ORAL | 0 refills | Status: AC | PRN

## 2017-12-06 MED ORDER — POLYETHYLENE GLYCOL 3350 17 G PO PACK: 17.0000 g | PACK | Freq: Two times a day (BID) | ORAL | 0 refills | Status: AC

## 2017-12-06 MED ORDER — CEPHALEXIN 500 MG PO CAPS: 500.0000 mg | ORAL_CAPSULE | Freq: Four times a day (QID) | ORAL | 0 refills | Status: AC

## 2017-12-06 MED ORDER — CLONAZEPAM 2 MG PO TABS: 2.0000 mg | ORAL_TABLET | Freq: Two times a day (BID) | ORAL | 0 refills | Status: DC

## 2017-12-06 MED ORDER — FLUCONAZOLE 100 MG PO TABS: 100.0000 mg | ORAL_TABLET | Freq: Every day | ORAL | 0 refills | Status: AC

## 2017-12-06 MED ORDER — BACITRACIN-NEOMYCIN-POLYMYXIN OINTMENT TUBE: 1.0000 "application " | TOPICAL_OINTMENT | Freq: Two times a day (BID) | CUTANEOUS | 0 refills | Status: DC

## 2017-12-06 MED ORDER — SENNA 8.6 MG PO TABS: 1.0000 | ORAL_TABLET | Freq: Two times a day (BID) | ORAL | 0 refills | Status: DC

## 2017-12-06 MED ORDER — CYCLOBENZAPRINE HCL 10 MG PO TABS: 10.0000 mg | ORAL_TABLET | Freq: Three times a day (TID) | ORAL | 0 refills | Status: DC | PRN

## 2017-12-06 NOTE — Progress Notes (Signed)
Physical Therapy Treatment Patient Details Name: Sonya Gibson MRN: [id number] DOB: Nov 11, 1985 Today's Date: 12/06/2017    History of Present Illness Pt is a 32 y/o female with h/o recent disk protrusions throughout lumbar spine with some impingement on L5, treated with steroid injection. Now presents 11/30/17 with lower back pain, right leg weakness, urinary incontinence and perineal numbness; s/p L4-5 microdiscectomy on 6/18. PMH includes HTN, PTSD, anxiety, and depression.   PT Comments    Pt progressing well with mobility. Amb in hallway with RW and throughout room with no DME, supervision for safety. Will plan to use ramp to enter home instead of stairs. Pt has met short-term acute PT goals; has no further questions or concerns. Encouraged to continue ambulating with supervision from family and/or nursing staff. Will d/c acute PT.   Follow Up Recommendations  No PT follow up;Supervision for mobility/OOB     Equipment Recommendations  None recommended by PT    Recommendations for Other Services       Precautions / Restrictions Precautions Precautions: Back Precaution Comments: Verbally reviewed precautions Restrictions Weight Bearing Restrictions: No    Mobility  Bed Mobility Overal bed mobility: Modified Independent             General bed mobility comments: Mod indep with her "own technique" for bed mobility with long-sitting/HOB elevated, as pt cannot tolerate laying flat  Transfers Overall transfer level: Modified independent Equipment used: Rolling walker (2 wheeled) Transfers: Sit to/from Stand Sit to Stand: Modified independent (Device/Increase time)         General transfer comment: Mod indep sit<>stand from chair, toilet, and bed with and without RW  Ambulation/Gait Ambulation/Gait assistance: Supervision Gait Distance (Feet): 120 Feet Assistive device: Rolling walker (2 wheeled);None Gait Pattern/deviations: Step-through pattern;Decreased stride  length;Antalgic;Trunk flexed Gait velocity: Decreased Gait velocity interpretation: 1.31 - 2.62 ft/sec, indicative of limited community ambulator General Gait Details: Slow, antalgic amb in hallway with RW and supervision, trunk flexed as pt reports increased pain with fully upright posture. Amb throughout room with no UE support and improved gait speed noted   Stairs             Wheelchair Mobility    Modified Rankin (Stroke Patients Only)       Balance Overall balance assessment: Needs assistance Sitting-balance support: No upper extremity supported;Feet supported Sitting balance-Leahy Scale: Good     Standing balance support: During functional activity;No upper extremity supported Standing balance-Leahy Scale: Fair Standing balance comment: Can amb throughout room with no UE support, intermittent reaching to furniture                             Cognition Arousal/Alertness: Awake/alert Behavior During Therapy: WFL for tasks assessed/performed Overall Cognitive Status: Within Functional Limits for tasks assessed                                        Exercises      General Comments        Pertinent Vitals/Pain Pain Assessment: Faces Faces Pain Scale: Hurts little more Pain Location: low back Pain Descriptors / Indicators: Aching;Sore Pain Intervention(s): Monitored during session;Limited activity within patient's tolerance    Home Living                      Prior Function  PT Goals (current goals can now be found in the care plan section) Acute Rehab PT Goals Patient Stated Goal: get back to normal PT Goal Formulation: With patient Time For Goal Achievement: 12/19/17 Potential to Achieve Goals: Good Progress towards PT goals: Goals met/education completed, patient discharged from PT    Frequency    Min 5X/week      PT Plan Current plan remains appropriate    Co-evaluation               AM-PAC PT "6 Clicks" Daily Activity  Outcome Measure  Difficulty turning over in bed (including adjusting bedclothes, sheets and blankets)?: A Little Difficulty moving from lying on back to sitting on the side of the bed? : A Little Difficulty sitting down on and standing up from a chair with arms (e.g., wheelchair, bedside commode, etc,.)?: None Help needed moving to and from a bed to chair (including a wheelchair)?: None Help needed walking in hospital room?: A Little Help needed climbing 3-5 steps with a railing? : A Little 6 Click Score: 20    End of Session Equipment Utilized During Treatment: Gait belt Activity Tolerance: Patient tolerated treatment well Patient left: in bed;with call bell/phone within reach Nurse Communication: Mobility status PT Visit Diagnosis: Pain;Other abnormalities of gait and mobility (R26.89);Difficulty in walking, not elsewhere classified (R26.2);Muscle weakness (generalized) (M62.81)     Time: 8381-8403 PT Time Calculation (min) (ACUTE ONLY): 26 min  Charges:  $Gait Training: 8-22 mins $Therapeutic Activity: 8-22 mins                    G Codes:      Mabeline Caras, PT, DPT Acute Rehab Services  Pager: [place] 12/06/2017, 9:35 AM

## 2017-12-06 NOTE — Discharge Summary (Signed)
[facility]  Physician Discharge Summary   Patient ID: Rusty AusHolly E Purington MRN: [id number] DOB/AGE: 32/09/1985 32 y.o.  Admit date: 11/30/2017 Discharge date: 12/06/2017  PCP: Hoy RegisterNewlin, Enobong, MD  DISCHARGE DIAGNOSES:  Acute low back pain secondary to L4-L5 disc herniation  RECOMMENDATIONS FOR OUTPATIENT FOLLOW UP: 1. Follow-up with neurosurgery in 2 weeks. 2. Patient instructed to follow-up with her PCP for blood glucose check.   DISCHARGE CONDITION: fair  Diet recommendation: Patient instructed to eat a low carbohydrate diet  Filed Weights   11/30/17 1931 12/05/17 1107  Weight: (!) 149.2 kg (329 lb) (!) 149.2 kg (329 lb)    INITIAL HISTORY: 32 year old Caucasian female with a past medical history of morbid obesity, GERD, hypertension, PTSD, depression with history of chronic back pain followed by spinal specialist in [place].  Has experienced a worsening symptoms in the last few weeks.  She had an epidural injection on May 31.  She went to see her spine specialist last week and had an MRI done at that time which showed that there was disc herniation.  Patient was referred to neurosurgery in [place].  However she started experiencing worsening symptoms in her right leg with weakness along with urinary incontinence.  She presented to the emergency department and was hospitalized.  Seen by neurosurgery.  Plan is for surgery today.  Consultants: Neurosurgery  Procedures: Right L4-5 laminectomy, medial facetectomy and foraminotomy followed by microdiscectomy L4-5 on the right   HOSPITAL COURSE:   Low back pain, acute on chronic; with radiculopathy Patient has had progression of her symptoms over the last few weeks.  She underwent epidural injection on May 31.  She is now experiencing worsening radiculopathy and symptoms and signs concerning for neurological involvement.  Apparently had MRI recently in [place] which showed disc herniation.  Neurosurgery was consulted.   Patient was placed on dexamethasone.  Patient subsequently underwent surgery on 6/18.  She has improved.  She was seen by physical and Occupational Therapy.  She does not need any further therapy.  Cleared by neurosurgery for discharge today.  Dexamethasone discontinued.  Skin wound and mild cellulitis in the lower abdominal skin Seen by wound care.  Placed on Keflex due to surrounding erythema.  Erythema has improved.  Continue with local treatment including bacitracin ointment.    Keflex for a few more days.  Hyperglycemia secondary to steroid Patient tells me that she used to have diabetes and was on metformin previously.  However this was tapered down once her weight reduced.  Her HbA1c is 6.4.  Current elevation and glucose level secondary to dexamethasone which has been discontinued.  Anticipate her glucose levels will continue to improve.  Does not need any glucose lowering agents at this time.  She should however follow-up with her primary care provider for a glucose check in the next few days.    Constipation Most likely secondary to narcotics.    Laxatives with good response.  Continue same at home.     Hypothyroidism Continue Synthroid  History of anxiety and PTSD Continue home medications.  History of essential hypertension BP was borderline low initially.  Her antihypertensives were held.    Blood pressure now in the hypertensive range.  Okay to resume home medications.    Morbid obesity Body mass index is 60.17 kg/m.  Followed by bariatric surgery with plans for intervention in the future.  Overall stable.  Cleared for discharge home by neurosurgery.  Okay for discharge today.     PERTINENT LABS:  The  results of significant diagnostics from this hospitalization (including imaging, microbiology, ancillary and laboratory) are listed below for reference.    Microbiology: Recent Results (from the past 240 hour(s))  Surgical pcr screen     Status: None    Collection Time: 12/04/17  4:43 AM  Result Value Ref [place] Status   MRSA, PCR NEGATIVE NEGATIVE Final   Staphylococcus aureus NEGATIVE NEGATIVE Final    Comment: (NOTE) The Xpert SA Assay (FDA approved for NASAL specimens in patients 23 years of age and older), is one component of a comprehensive surveillance program. It is not intended to diagnose infection nor to guide or monitor treatment. Performed at [facility], [address] [place], [place] [address]      Labs: Basic Metabolic Panel: Recent Labs  Lab 11/30/17 1715 12/01/17 0500 12/04/17 0309  NA 138 134* 136  K 4.3 4.7 4.4  CL 98* 100* 102  CO2 30 26 24   GLUCOSE 107* 184* 334*  BUN 13 15 14   CREATININE 0.86 0.89 0.94  CALCIUM 9.9 8.9 8.6*   CBC: Recent Labs  Lab 11/30/17 1715 12/01/17 0500 12/04/17 0309  WBC 10.9* 11.0* 10.6*  NEUTROABS 6.5  --   --   HGB 11.8* 12.1 11.1*  HCT 38.0 38.7 35.6*  MCV 93.8 92.1 93.0  PLT 317 297 274    CBG: Recent Labs  Lab 12/05/17 2129 12/05/17 2307 12/06/17 0155 12/06/17 0646 12/06/17 1134  GLUCAP 356* 351* 234* 244* 220*     IMAGING STUDIES Dg Lumbar Spine 2-3 Views  Result Date: 12/05/2017 CLINICAL DATA:  L4-5 microdiskectomy. EXAM: LUMBAR SPINE - 2-3 VIEW COMPARISON:  MRI of November 24, 2017. FINDINGS: Two intraoperative cross-table lateral projections of the lumbar spine were obtained. The first image demonstrates surgical probe directed toward posterior spinous process of L4. The second image demonstrates surgical probe directed toward posterior elements of L5. IMPRESSION: Surgical localization as described above. Electronically Signed   By: Lupita Raider, M.D.   On: 12/05/2017 12:28   Dg Ankle Complete Right  Result Date: 11/27/2017 CLINICAL DATA:  Ankle pain. EXAM: RIGHT ANKLE - COMPLETE 3+ VIEW COMPARISON:  06/14/2016 FINDINGS: There is no evidence of fracture, dislocation, or joint effusion. There is no evidence of arthropathy or other focal bone  abnormality. Soft tissues are unremarkable. IMPRESSION: Negative. Electronically Signed   By: Kennith Center M.D.   On: 11/27/2017 20:16   Mr Lumbar Spine Wo Contrast  Result Date: 11/15/2017 CLINICAL DATA:  32 y/o F; sudden onset lower right-sided back pain radiating into the right buttocks and right thigh. EXAM: MRI LUMBAR SPINE WITHOUT CONTRAST TECHNIQUE: Multiplanar, multisequence MR imaging of the lumbar spine was performed. No intravenous contrast was administered. COMPARISON:  05/01/2016 CT abdomen and pelvis. FINDINGS: Segmentation:  Standard. Alignment:  Physiologic. Vertebrae:  No fracture, evidence of discitis, or bone lesion. Conus medullaris and cauda equina: Conus extends to the L1 level. Fatty filum. Paraspinal and other soft tissues: Negative. Disc levels: L1-2: No significant disc displacement, foraminal stenosis, or canal stenosis. L2-3: No significant disc displacement, foraminal stenosis, or canal stenosis. L3-4: Small left foraminal disc protrusion with mild left foraminal stenosis. No canal stenosis. L4-5: Small right subarticular and foraminal disc protrusion which contacts the descending L5 nerve root in the right lateral recess (series 8, image 20) and results in mild right-sided foraminal stenosis. No canal stenosis. L5-S1: Small central disc protrusion. No significant foraminal or canal stenosis. IMPRESSION: 1. No acute osseous abnormality or malalignment. 2.  Small disc protrusions are present at the L3-4, L4-5, and L5-S1 levels. 3. Mild left L3-4 and mild right L4-5 foraminal stenosis. No significant canal stenosis. 4. Protrusion contact on the descending right L5 nerve root in the right L4-5 lateral recess. Electronically Signed   By: Mitzi Hansen M.D.   On: 11/15/2017 01:02   Ir Epidurography  Result Date: 11/17/2017 CLINICAL DATA:  Right lower extremity radiculitis. Acute annular tear and disc protrusion at L4-5. Right L4 and L5 radiculopathy. FLUOROSCOPY TIME:   Radiation Exposure Index (as provided by the fluoroscopic device): 493.92 uGy*m2 Fluoroscopy Time:  24 seconds Number of Acquired Images:  0 PROCEDURE: LUMBAR EPIDURAL INJECTION: An interlaminar approach was performed on the right at L4-5. The overlying skin was cleansed and anesthetized. A 20 gauge spinal needle was advanced using loss-of-resistance technique. Injection of 2cc of Isovue-M 200 confirmed epidural placement. There was no evidence for intravascular or intrathecal spread of contrast. I then injected 120 mg of Depo-Medrol and 3ml of 1% lidocaine. The patient tolerated the procedure without evidence for complication. The patient was observed for 20 minutes prior to discharge in stable neurologic condition. IMPRESSION: Technically successful first interlaminar epidural steroid injection on the right at L4-5. Electronically Signed   By: Marin Roberts M.D.   On: 11/17/2017 13:49    DISCHARGE EXAMINATION: Vitals:   12/05/17 1443 12/05/17 2040 12/06/17 0558 12/06/17 0652  BP: (!) 142/89 (!) 142/77 (!) 155/101 138/76  Pulse: 66 93 71   Resp: 16  10   Temp: (!) 97.5 F (36.4 C) 97.6 F (36.4 C) 98.1 F (36.7 C)   TempSrc: Oral Oral Oral   SpO2: 99% 98% 97%   Weight:      Height:       General appearance: alert, cooperative, appears stated age and no distress Resp: clear to auscultation bilaterally Cardio: regular rate and rhythm, S1, S2 normal, no murmur, click, rub or gallop GI: soft, non-tender; bowel sounds normal; no masses,  no organomegaly Extremities: extremities normal, atraumatic, no cyanosis or edema Skin: Patient has a wound in the lower abdomen with yellowish exudate.  Surrounding erythema has cleared.  DISPOSITION: Home  Discharge Instructions    Call MD for:  difficulty breathing, headache or visual disturbances   Complete by:  As directed    Call MD for:  extreme fatigue   Complete by:  As directed    Call MD for:  persistant dizziness or light-headedness    Complete by:  As directed    Call MD for:  persistant nausea and vomiting   Complete by:  As directed    Call MD for:  redness, tenderness, or signs of infection (pain, swelling, redness, odor or green/yellow discharge around incision site)   Complete by:  As directed    Call MD for:  severe uncontrolled pain   Complete by:  As directed    Call MD for:  temperature >100.4   Complete by:  As directed    Diet general   Complete by:  As directed    Discharge instructions   Complete by:  As directed    Please be sure to follow-up with your primary care provider for your high glucose levels.  Your high glucose levels thought to be due to steroids that you received in the hospital.  The levels should come down within the next few days.  Eat a low-carb diet and sugar-free liquids for the next few days.  Continue with the ointment to the abdominal  wound.  Apply twice a day.  Keep that area dry and clean.  Follow-up with Dr. Yetta Barre.  Call his office for any questions regarding back issues.  You were cared for by a hospitalist during your hospital stay. If you have any questions about your discharge medications or the care you received while you were in the hospital after you are discharged, you can call the unit and asked to speak with the hospitalist on call if the hospitalist that took care of you is not available. Once you are discharged, your primary care physician will handle any further medical issues. Please note that NO REFILLS for any discharge medications will be authorized once you are discharged, as it is imperative that you return to your primary care physician (or establish a relationship with a primary care physician if you do not have one) for your aftercare needs so that they can reassess your need for medications and monitor your lab values. If you do not have a primary care physician, you can call [phone] for a physician referral.   Increase activity slowly   Complete by:  As directed          Allergies as of 12/06/2017      Reactions   Coconut Oil Anaphylaxis   Nitrofurantoin Hives      Medication List    STOP taking these medications   HYDROcodone-acetaminophen 5-325 MG tablet Commonly known as:  NORCO/VICODIN   lactulose 10 GM/15ML solution Commonly known as:  CHRONULAC   oxycodone 5 MG capsule Commonly known as:  OXY-IR Replaced by:  oxyCODONE 5 MG immediate release tablet     TAKE these medications   calcium carbonate 750 MG chewable tablet Commonly known as:  TUMS EX Chew 2 tablets by mouth as needed for heartburn.   cephALEXin 500 MG capsule Commonly known as:  KEFLEX Take 1 capsule (500 mg total) by mouth every 6 (six) hours for 4 days.   clonazePAM 2 MG tablet Commonly known as:  KLONOPIN Take 1 tablet (2 mg total) by mouth 2 (two) times daily for 15 doses.   cyclobenzaprine 10 MG tablet Commonly known as:  FLEXERIL Take 1 tablet (10 mg total) by mouth 3 (three) times daily as needed for muscle spasms.   fluconazole 100 MG tablet Commonly known as:  DIFLUCAN Take 1 tablet (100 mg total) by mouth daily for 5 days. Start taking on:  12/07/2017   gabapentin 300 MG capsule Commonly known as:  NEURONTIN Take 2 capsules (600 mg total) by mouth 3 (three) times daily.   ibuprofen 200 MG tablet Commonly known as:  ADVIL,MOTRIN Take 800 mg by mouth as needed for moderate pain.   levothyroxine 50 MCG tablet Commonly known as:  SYNTHROID, LEVOTHROID Take 50 mcg by mouth at bedtime.   lisinopril-hydrochlorothiazide 10-12.5 MG tablet Commonly known as:  PRINZIDE,ZESTORETIC Take 1 tablet by mouth daily.   Melatonin 10 MG Tabs Take 10 mg by mouth daily as needed (sleep).   neomycin-bacitracin-polymyxin Oint Commonly known as:  NEOSPORIN Apply 1 application topically 2 (two) times daily.   ondansetron 8 MG disintegrating tablet Commonly known as:  ZOFRAN ODT Take 1 tablet (8 mg total) by mouth every 8 (eight) hours as needed for up to 20  doses for nausea or vomiting.   oxyCODONE 5 MG immediate release tablet Commonly known as:  Oxy IR/ROXICODONE Take 1-2 tablets (5-10 mg total) by mouth every 4 (four) hours as needed for up to 7 days for severe pain ((  score 7 to 10)). Replaces:  oxycodone 5 MG capsule   polyethylene glycol packet Commonly known as:  MIRALAX / GLYCOLAX Take 17 g by mouth 2 (two) times daily for 15 doses. What changed:    when to take this  reasons to take this   promethazine 12.5 MG tablet Commonly known as:  PHENERGAN Take 1 tablet (12.5 mg total) by mouth every 6 (six) hours as needed for refractory nausea / vomiting.   senna 8.6 MG Tabs tablet Commonly known as:  SENOKOT Take 1 tablet (8.6 mg total) by mouth 2 (two) times daily.   traZODone 150 MG tablet Commonly known as:  DESYREL Take 150 mg by mouth at bedtime as needed for sleep.   venlafaxine XR 150 MG 24 hr capsule Commonly known as:  EFFEXOR-XR Take 225 mg by mouth at bedtime. 150mg  and 75mg  nightly            Durable Medical Equipment  (From admission, onward)        Start     Ordered   12/05/17 1408  DME Walker rolling  Once    Question:  Patient needs a walker to treat with the following condition  Answer:  Gait disturbance   12/05/17 1407       Follow-up Information    Tia Alert, MD. Schedule an appointment as soon as possible for a visit in 2 week(s).   Specialty:  Neurosurgery Contact information: [address] [place] [address] [phone]        Hoy Register, MD. Schedule an appointment as soon as possible for a visit in 1 week(s).   Specialty:  Family Medicine Why:  to follow up on high glucose levels Contact information: [address] [place] [address] [phone]           TOTAL DISCHARGE TIME: 35 minutes  Osvaldo Shipper  [facility] Pager [phone]  12/06/2017, 1:10 PM

## 2017-12-06 NOTE — Evaluation (Signed)
Occupational Therapy Evaluation Patient Details Name: Sonya Gibson MRN: [id number] DOB: [birth year] Today's Date: 12/06/2017    History of Present Illness Pt is a 32 y/o female with h/o recent disk protrusions throughout lumbar spine with some impingement on L5, treated with steroid injection. Now presents 11/30/17 with lower back pain, right leg weakness, urinary incontinence and perineal numbness; s/p L4-5 microdiscectomy on 6/18. PMH includes HTN, PTSD, anxiety, and depression.   Clinical Impression   PTA, pt had assistance from husband for dressing, bathing, and toileting hygiene. She was ambulating with RW without assistance in her home. She does have AE for LB ADL from previous admission and encouraged pt to utilize this to maximize independence. She reports desire to decrease dependence on husband for daily self-care tasks. Pt requiring max assist for toilet hygiene and min assist for LB ADL this session. Educated pt concerning safe tub/shower transfers including use of both tub bench and shower seat. She is most comfortable with step-over method using shower seat and did demonstrate good safety with this requiring supervision. She would benefit from continued OT services while admitted to improve independence and safety with ADL and functional mobility. Do not anticipate need for OT follow-up post-acute D/C. Will continue to follow while admitted.     Follow Up Recommendations  No OT follow up;Supervision/Assistance - 24 hour    Equipment Recommendations  Tub/shower seat    Recommendations for Other Services       Precautions / Restrictions Precautions Precautions: Back Precaution Booklet Issued: Yes (comment) Precaution Comments: Verbally reviewed precautions with pt able to state but requires cues.  Required Braces or Orthoses: (no brace needed per MD orders) Restrictions Weight Bearing Restrictions: No      Mobility Bed Mobility Overal bed mobility: Modified Independent              General bed mobility comments: OOB in bathroom on my arrival  Transfers Overall transfer level: Needs assistance Equipment used: None Transfers: Sit to/from Stand Sit to Stand: Supervision         General transfer comment: Supervision for sit<>stand    Balance Overall balance assessment: Needs assistance Sitting-balance support: No upper extremity supported;Feet supported Sitting balance-Leahy Scale: Good     Standing balance support: During functional activity;No upper extremity supported Standing balance-Leahy Scale: Fair Standing balance comment: Close supervision for dynamic tasks without UE support.                            ADL either performed or assessed with clinical judgement   ADL Overall ADL's : Needs assistance/impaired Eating/Feeding: Sitting;Set up   Grooming: Supervision/safety;Standing   Upper Body Bathing: Set up;Sitting   Lower Body Bathing: Minimal assistance;Sit to/from stand Lower Body Bathing Details (indicate cue type and reason): Pt has long handled sponge and has the ability to use this.  Upper Body Dressing : Set up;Sitting   Lower Body Dressing: Minimal assistance;Sit to/from stand Lower Body Dressing Details (indicate cue type and reason): Pt has reacher and sock aide at home and reports understanding of use.  Toilet Transfer: Supervision/safety;BSC;RW   Toileting- Architect and Hygiene: Sit to/from stand;Maximal assistance Toileting - Architect Details (indicate cue type and reason): RN assisting with pericare; without RW Tub/ Shower Transfer: Tub transfer;Shower seat;Tub bench;Ambulation;Supervision/safety Tub/Shower Transfer Details (indicate cue type and reason): Pt educated on both use of tub bench and shower seat. She is able to complete both with min guard assist and  is better able to utilize shower seat stepping over tub to adhere to back precautions.  Functional mobility during  ADLs: Supervision/safety General ADL Comments: Pt requesting not to use RW this session. She was able to ambulate in room with min guard assist and complete toileting tasks. RN providing assist for toileting hygiene. Educated concerning use of toilet aide but pt reports that tongs are difficult for her to use. Recommended use of toilet wand to improve ability to control and she reports that she will purchase this. Also planning to have handheld shower head installed.      Vision Patient Visual Report: No change from baseline       Perception     Praxis      Pertinent Vitals/Pain Pain Assessment: Faces Faces Pain Scale: Hurts little more Pain Location: low back Pain Descriptors / Indicators: Aching;Sore Pain Intervention(s): Limited activity within patient's tolerance;Monitored during session;Repositioned     Hand Dominance     Extremity/Trunk Assessment Upper Extremity Assessment Upper Extremity Assessment: Overall WFL for tasks assessed   Lower Extremity Assessment Lower Extremity Assessment: Defer to PT evaluation   Cervical / Trunk Assessment Cervical / Trunk Assessment: Other exceptions Cervical / Trunk Exceptions: s/p spine surgery   Communication Communication Communication: No difficulties   Cognition Arousal/Alertness: Awake/alert Behavior During Therapy: WFL for tasks assessed/performed Overall Cognitive Status: Within Functional Limits for tasks assessed                                     General Comments  No family present during session.     Exercises     Shoulder Instructions      Home Living Family/patient expects to be discharged to:: Private residence Living Arrangements: Spouse/significant other;Children Available Help at Discharge: Family;Available PRN/intermittently Type of Home: House Home Access: Ramped entrance     Home Layout: One level     Bathroom Shower/Tub: Chief Strategy Officer: Standard     Home  Equipment: Environmental consultant - 2 wheels;Bedside commode          Prior Functioning/Environment Level of Independence: Independent with assistive device(s)                 OT Problem List: Decreased strength;Decreased range of motion;Decreased activity tolerance;Impaired balance (sitting and/or standing);Decreased safety awareness;Decreased knowledge of use of DME or AE;Decreased knowledge of precautions;Pain      OT Treatment/Interventions: Self-care/ADL training;Therapeutic exercise;Energy conservation;DME and/or AE instruction;Therapeutic activities;Patient/family education;Balance training    OT Goals(Current goals can be found in the care plan section) Acute Rehab OT Goals Patient Stated Goal: get back to normal OT Goal Formulation: With patient Time For Goal Achievement: 12/20/17 Potential to Achieve Goals: Good ADL Goals Pt Will Perform Lower Body Bathing: with modified independence;sit to/from stand;with adaptive equipment Pt Will Perform Lower Body Dressing: with modified independence;with adaptive equipment;sit to/from stand Pt Will Perform Toileting - Clothing Manipulation and hygiene: with modified independence;sitting/lateral leans;with adaptive equipment  OT Frequency: Min 2X/week   Barriers to D/C:            Co-evaluation              AM-PAC PT "6 Clicks" Daily Activity     Outcome Measure Help from another person eating meals?: None Help from another person taking care of personal grooming?: A Little Help from another person toileting, which includes using toliet, bedpan, or urinal?: A Lot Help from  another person bathing (including washing, rinsing, drying)?: A Little Help from another person to put on and taking off regular upper body clothing?: A Little Help from another person to put on and taking off regular lower body clothing?: A Little 6 Click Score: 18   End of Session Nurse Communication: Mobility status(RN present at onset of session)  Activity  Tolerance: Patient tolerated treatment well Patient left: in chair;with call bell/phone within reach(with PT entering room)  OT Visit Diagnosis: Unsteadiness on feet (R26.81);Pain Pain - part of body: (back)                Time: [id number] OT Time Calculation (min): 39 min Charges:  OT General Charges $OT Visit: 1 Visit OT Evaluation $OT Eval Moderate Complexity: 1 Mod OT Treatments $Self Care/Home Management : 23-37 mins G-Codes:     Doristine Sectionharity A Darcell Yacoub, MS OTR/L  Pager: [phone]   Bronx Brogden A Awais Cobarrubias 12/06/2017, 10:36 AM

## 2017-12-06 NOTE — Plan of Care (Signed)
  Problem: Education: Goal: Knowledge of General Education information will improve Outcome: Progressing   

## 2017-12-06 NOTE — Progress Notes (Addendum)
Inpatient Diabetes Program Recommendations  AACE/ADA: New Consensus Statement on Inpatient Glycemic Control (2019)  Target Ranges:  Prepandial:   less than 140 mg/dL      Peak postprandial:   less than 180 mg/dL (1-2 hours)      Critically ill patients:  140 - 180 mg/dL  Results for Rusty AusOVERBY, Latria E (MRN [id number]) as of 12/06/2017 12:31  Ref. Range 12/05/2017 06:32 12/05/2017 11:11 12/05/2017 16:39 12/05/2017 21:29 12/05/2017 23:07 12/06/2017 01:55 12/06/2017 06:46 12/06/2017 11:34  Glucose-Capillary Latest Ref Range: 65 - 99 mg/dL 409231 (H) 811178 (H) 914186 (H) 356 (H) 351 (H) 234 (H) 244 (H) 220 (H)   Results for Rusty AusOVERBY, Asencion E (MRN [id number]) as of 12/06/2017 12:31  Ref. Range 12/04/2017 03:09  Hemoglobin A1C Latest Ref Range: 4.8 - 5.6 % 6.4 (H)   Review of Glycemic Control  Diabetes history: No Outpatient Diabetes medications: NA Current orders for Inpatient glycemic control: Novolog 0-15 units TID with meals  Inpatient Diabetes Program Recommendations:  Correction (SSI): Please consider ordering Novolog 0-5 units QHS for bedtime correction scale. HgbA1C: A1C 6.4% on 12/04/17 indicating an average glucose of 137 mg/dl over the past 2-3 months. Noted in H&P, patient received steroid injection on 11/17/17 and patient has been ordered steroids since admitted which is contributing to A1C results.  NOTE: Noted patient received Decadron 10 mg on 12/05/17 which has contributed to hyperglycemia. Anticipate glucose will improve with no further steroids ordered.   Thanks, Orlando PennerMarie Jonh Mcqueary, RN, MSN, CDE Diabetes Coordinator Inpatient Diabetes Program [phone] (Team Pager from 8am to 5pm)

## 2017-12-06 NOTE — Progress Notes (Signed)
Subjective: Patient reports appropriate back soreness, leg better, numbness and movement improved, c/o LEFT hip tightness  Objective: Vital signs in last 24 hours: Temp:  [97.5 F (36.4 C)-98.1 F (36.7 C)] 98.1 F (36.7 C) (06/19 0558) Pulse Rate:  [66-100] 71 (06/19 0558) Resp:  [10-16] 10 (06/19 0558) BP: (138-155)/(76-105) 138/76 (06/19 0652) SpO2:  [90 %-99 %] 97 % (06/19 0558) Weight:  [149.2 kg (329 lb)] 149.2 kg (329 lb) (06/18 1107)  Intake/Output from previous day: 06/18 0701 - 06/19 0700 In: 2161.5 [P.O.:960; I.V.:1001.5; IV Piggyback:200] Out: -  Intake/Output this shift: No intake/output data recorded.  Neurologic: Grossly normal, moving RLE better with improved strength and effort  Lab Results: Lab Results  Component Value Date   WBC 10.6 (H) 12/04/2017   HGB 11.1 (L) 12/04/2017   HCT 35.6 (L) 12/04/2017   MCV 93.0 12/04/2017   PLT 274 12/04/2017   No results found for: INR, PROTIME BMET Lab Results  Component Value Date   NA 136 12/04/2017   K 4.4 12/04/2017   CL 102 12/04/2017   CO2 24 12/04/2017   GLUCOSE 334 (H) 12/04/2017   BUN 14 12/04/2017   CREATININE 0.94 12/04/2017   CALCIUM 8.6 (L) 12/04/2017    Studies/Results: Dg Lumbar Spine 2-3 Views  Result Date: 12/05/2017 CLINICAL DATA:  L4-5 microdiskectomy. EXAM: LUMBAR SPINE - 2-3 VIEW COMPARISON:  MRI of November 24, 2017. FINDINGS: Two intraoperative cross-table lateral projections of the lumbar spine were obtained. The first image demonstrates surgical probe directed toward posterior spinous process of L4. The second image demonstrates surgical probe directed toward posterior elements of L5. IMPRESSION: Surgical localization as described above. Electronically Signed   By: Lupita RaiderJames  Green Jr, M.D.   On: 12/05/2017 12:28    Assessment/Plan: Able to go home from my standpoint when pain controlled and able to mobilize safely  Estimated body mass index is 60.16 kg/m as calculated from the following:  Height as of this encounter: 5' 2.01" (1.575 m).   Weight as of this encounter: 149.2 kg (329 lb).    LOS: 5 days    Sheetal Lyall S 12/06/2017, 7:44 AM

## 2017-12-08 ENCOUNTER — Encounter (HOSPITAL_COMMUNITY): Payer: Self-pay | Admitting: Emergency Medicine

## 2017-12-08 ENCOUNTER — Emergency Department (HOSPITAL_COMMUNITY)
Admission: EM | Admit: 2017-12-08 | Discharge: 2017-12-08 | Disposition: A | Discharge status: Home / Self Care | Payer: Self-pay | Attending: Emergency Medicine | Admitting: Emergency Medicine

## 2017-12-08 ENCOUNTER — Emergency Department (HOSPITAL_COMMUNITY): Payer: Self-pay

## 2017-12-08 ENCOUNTER — Other Ambulatory Visit: Payer: Self-pay

## 2017-12-08 DIAGNOSIS — E039 Hypothyroidism, unspecified: Secondary | ICD-10-CM | POA: Insufficient documentation

## 2017-12-08 DIAGNOSIS — R1084 Generalized abdominal pain: Secondary | ICD-10-CM | POA: Insufficient documentation

## 2017-12-08 DIAGNOSIS — R0602 Shortness of breath: Secondary | ICD-10-CM | POA: Insufficient documentation

## 2017-12-08 DIAGNOSIS — Z79899 Other long term (current) drug therapy: Secondary | ICD-10-CM | POA: Insufficient documentation

## 2017-12-08 DIAGNOSIS — Z9889 Other specified postprocedural states: Secondary | ICD-10-CM | POA: Insufficient documentation

## 2017-12-08 DIAGNOSIS — I10 Essential (primary) hypertension: Secondary | ICD-10-CM | POA: Insufficient documentation

## 2017-12-08 LAB — CBC WITH DIFFERENTIAL/PLATELET
BASOS ABS: 0 10*3/uL (ref 0.0–0.1)
Basophils Relative: 0 %
EOS PCT: 3 %
Eosinophils Absolute: 0.3 10*3/uL (ref 0.0–0.7)
HCT: 35.7 % — ABNORMAL LOW (ref 36.0–46.0)
Hemoglobin: 11.4 g/dL — ABNORMAL LOW (ref 12.0–15.0)
LYMPHS PCT: 35 %
Lymphs Abs: 3.8 10*3/uL (ref 0.7–4.0)
MCH: 30.1 pg (ref 26.0–34.0)
MCHC: 31.9 g/dL (ref 30.0–36.0)
MCV: 94.2 fL (ref 78.0–100.0)
MONO ABS: 0.5 10*3/uL (ref 0.1–1.0)
Monocytes Relative: 4 %
Neutro Abs: 6.4 10*3/uL (ref 1.7–7.7)
Neutrophils Relative %: 58 %
PLATELETS: 244 10*3/uL (ref 150–400)
RBC: 3.79 MIL/uL — ABNORMAL LOW (ref 3.87–5.11)
RDW: 17.1 % — AB (ref 11.5–15.5)
WBC: 11 10*3/uL — ABNORMAL HIGH (ref 4.0–10.5)

## 2017-12-08 LAB — URINALYSIS, ROUTINE W REFLEX MICROSCOPIC
BACTERIA UA: NONE SEEN
Bilirubin Urine: NEGATIVE
Glucose, UA: NEGATIVE mg/dL
KETONES UR: NEGATIVE mg/dL
Leukocytes, UA: NEGATIVE
Nitrite: NEGATIVE
PH: 7 (ref 5.0–8.0)
Protein, ur: NEGATIVE mg/dL
Specific Gravity, Urine: 1.016 (ref 1.005–1.030)

## 2017-12-08 LAB — I-STAT CHEM 8, ED
BUN: 28 mg/dL — AB (ref 6–20)
Calcium, Ion: 1.15 mmol/L (ref 1.15–1.40)
Chloride: 94 mmol/L — ABNORMAL LOW (ref 101–111)
Creatinine, Ser: 0.8 mg/dL (ref 0.44–1.00)
GLUCOSE: 113 mg/dL — AB (ref 65–99)
HCT: 33 % — ABNORMAL LOW (ref 36.0–46.0)
Hemoglobin: 11.2 g/dL — ABNORMAL LOW (ref 12.0–15.0)
Potassium: 5.1 mmol/L (ref 3.5–5.1)
Sodium: 135 mmol/L (ref 135–145)
TCO2: 35 mmol/L — AB (ref 22–32)

## 2017-12-08 LAB — COMPREHENSIVE METABOLIC PANEL
ALK PHOS: 56 U/L (ref 38–126)
ALT: 46 U/L (ref 14–54)
AST: 25 U/L (ref 15–41)
Albumin: 3.3 g/dL — ABNORMAL LOW (ref 3.5–5.0)
Anion gap: 10 (ref 5–15)
BILIRUBIN TOTAL: 0.5 mg/dL (ref 0.3–1.2)
BUN: 24 mg/dL — AB (ref 6–20)
CALCIUM: 9.3 mg/dL (ref 8.9–10.3)
CO2: 33 mmol/L — ABNORMAL HIGH (ref 22–32)
Chloride: 95 mmol/L — ABNORMAL LOW (ref 101–111)
Creatinine, Ser: 0.87 mg/dL (ref 0.44–1.00)
GFR calc Af Amer: >60 mL/min (ref >60)
GFR calc non Af Amer: >60 mL/min (ref >60)
GLUCOSE: 108 mg/dL — AB (ref 65–99)
Potassium: 5 mmol/L (ref 3.5–5.1)
Sodium: 138 mmol/L (ref 135–145)
TOTAL PROTEIN: 6.3 g/dL — AB (ref 6.5–8.1)

## 2017-12-08 LAB — LIPASE, BLOOD: Lipase: 24 U/L (ref 11–51)

## 2017-12-08 MED ORDER — HYDROMORPHONE HCL 1 MG/ML IJ SOLN
1.0000 mg | Freq: Once | INTRAMUSCULAR | Status: AC
  Administered 2017-12-08: 1 mg via INTRAVENOUS
  Filled 2017-12-08: qty 1

## 2017-12-08 MED ORDER — ONDANSETRON HCL 4 MG/2ML IJ SOLN
4.0000 mg | Freq: Once | INTRAMUSCULAR | Status: AC
  Administered 2017-12-08: 4 mg via INTRAVENOUS
  Filled 2017-12-08: qty 2

## 2017-12-08 MED ORDER — SODIUM CHLORIDE 0.9 % IV SOLN: INTRAVENOUS | Status: DC

## 2017-12-08 MED ORDER — IOPAMIDOL (ISOVUE-370) INJECTION 76%
100.0000 mL | Freq: Once | INTRAVENOUS | Status: AC | PRN
  Administered 2017-12-08: 100 mL via INTRAVENOUS

## 2017-12-08 MED ORDER — SODIUM CHLORIDE 0.9 % IV SOLN
INTRAVENOUS | Status: DC
  Administered 2017-12-08: 20:00:00 via INTRAVENOUS

## 2017-12-08 NOTE — Discharge Instructions (Signed)
Take the pain medication you have at home.  Keep your follow-up with your neurosurgeon.  Follow-up with your doctor later this week.  Today's extensive work-up without significant abnormalities.  Return for any new or worse symptoms.

## 2017-12-08 NOTE — ED Provider Notes (Signed)
[facility] EMERGENCY DEPARTMENT Provider Note   CSN: [id number] Arrival date & time: 12/08/17  1732     History   Chief Complaint Chief Complaint  Patient presents with  . Shortness of Breath    HPI Sonya Gibson is a 32 y.o. female.   Patient status post admission for an acute herniated disc.  She was admitted from June 13 discharged home on June 19.  Patient on June 18 underwent a microdiscectomy of L4-L5 on the right side.  Patient states that she is having significant abdominal distention and pain feels as if she has leg swelling and swelling in her abdomen and chest area and has shortness of breath.  Patient without any significant low back pain.  No significant pain radiating into her legs.  Denies any fevers.  Denies any cough or congestion.     Past Medical History:  Diagnosis Date  . Anxiety   . Depression   . Essential hypertension   . GERD (gastroesophageal reflux disease)   . Hypothyroidism   . PTSD (post-traumatic stress disorder)     Patient Active Problem List   Diagnosis Date Noted  . Lumbar radiculopathy   . S/P lumbar laminectomy 12/05/2017  . Perineal numbness 11/30/2017  . Weakness of right lower extremity 11/17/2017  . Lower back pain 11/15/2017  . Hypothyroidism   . Anxiety   . Essential hypertension   . PTSD (post-traumatic stress disorder)   . Decreased sensation of leg   . GESTATIONAL DIABETES 04/26/2010    Past Surgical History:  Procedure Laterality Date  . ANTERIOR CRUCIATE LIGAMENT REPAIR Left   . CESAREAN SECTION    . IR EPIDUROGRAPHY  11/17/2017  . LUMBAR LAMINECTOMY/DECOMPRESSION MICRODISCECTOMY Right 12/05/2017   Procedure: Right L4-5 Microdiskectomy;  Surgeon: Tia Alert, MD;  Location: [facility] OR;  Service: Neurosurgery;  Laterality: Right;  . middle finger reatachment       OB History   None      Home Medications    Prior to Admission medications   Medication Sig Start Date End Date Taking? Authorizing Provider    calcium carbonate (TUMS EX) 750 MG chewable tablet Chew 2 tablets by mouth as needed for heartburn.    [provider]  cephALEXin (KEFLEX) 500 MG capsule Take 1 capsule (500 mg total) by mouth every 6 (six) hours for 4 days. 12/06/17 12/10/17  Osvaldo Shipper, MD  clonazePAM (KLONOPIN) 2 MG tablet Take 1 tablet (2 mg total) by mouth 2 (two) times daily for 15 doses. 12/06/17 12/14/17  Osvaldo Shipper, MD  cyclobenzaprine (FLEXERIL) 10 MG tablet Take 1 tablet (10 mg total) by mouth 3 (three) times daily as needed for muscle spasms. 12/06/17   Osvaldo Shipper, MD  fluconazole (DIFLUCAN) 100 MG tablet Take 1 tablet (100 mg total) by mouth daily for 5 days. 12/07/17 12/12/17  Osvaldo Shipper, MD  gabapentin (NEURONTIN) 300 MG capsule Take 2 capsules (600 mg total) by mouth 3 (three) times daily. 11/21/17 12/21/17  Amin, Loura Halt, MD  ibuprofen (ADVIL,MOTRIN) 200 MG tablet Take 800 mg by mouth as needed for moderate pain.    [provider]  levothyroxine (SYNTHROID, LEVOTHROID) 50 MCG tablet Take 50 mcg by mouth at bedtime.  06/06/17   [provider]  lisinopril-hydrochlorothiazide (PRINZIDE,ZESTORETIC) 10-12.5 MG tablet Take 1 tablet by mouth daily. 10/27/17   [provider]  Melatonin 10 MG TABS Take 10 mg by mouth daily as needed (sleep).    [provider]  neomycin-bacitracin-polymyxin (  NEOSPORIN) OINT Apply 1 application topically 2 (two) times daily. 12/06/17   Osvaldo Shipper, MD  ondansetron (ZOFRAN ODT) 8 MG disintegrating tablet Take 1 tablet (8 mg total) by mouth every 8 (eight) hours as needed for up to 20 doses for nausea or vomiting. 11/21/17   Amin, Loura Halt, MD  oxyCODONE (OXY IR/ROXICODONE) 5 MG immediate release tablet Take 1-2 tablets (5-10 mg total) by mouth every 4 (four) hours as needed for up to 7 days for severe pain ((score 7 to 10)). 12/06/17 12/13/17  Osvaldo Shipper, MD  polyethylene glycol [facility] Ethelene Hal) packet Take 17 g by mouth 2  (two) times daily for 15 doses. 12/06/17 12/14/17  Osvaldo Shipper, MD  promethazine (PHENERGAN) 12.5 MG tablet Take 1 tablet (12.5 mg total) by mouth every 6 (six) hours as needed for refractory nausea / vomiting. 11/21/17   Amin, Loura Halt, MD  senna (SENOKOT) 8.6 MG TABS tablet Take 1 tablet (8.6 mg total) by mouth 2 (two) times daily. 12/06/17   Osvaldo Shipper, MD  traZODone (DESYREL) 150 MG tablet Take 150 mg by mouth at bedtime as needed for sleep.    [provider]  venlafaxine XR (EFFEXOR-XR) 150 MG 24 hr capsule Take 225 mg by mouth at bedtime. 150mg  and 75mg  nightly 06/06/17 11/30/17  [provider]    Family History Family History  Problem Relation Age of Onset  . Diabetes Mellitus II Mother   . Hypertension Mother   . Seizures Mother   . Hypertension Father   . Diabetes Mellitus II Father   . Hyperlipidemia Father   . Bipolar disorder Sister     Social History Social History   Tobacco Use  . Smoking status: Never Smoker  . Smokeless tobacco: Never Used  Substance Use Topics  . Alcohol use: Yes    Comment: rare  . Drug use: No     Allergies   Coconut oil and Nitrofurantoin   Review of Systems Review of Systems  Constitutional: Negative for fever.  HENT: Negative for congestion.   Eyes: Negative for visual disturbance.  Respiratory: Positive for shortness of breath.   Cardiovascular: Positive for leg swelling. Negative for chest pain.  Gastrointestinal: Positive for abdominal distention and abdominal pain. Negative for nausea and vomiting.  Genitourinary: Negative for dysuria.  Musculoskeletal: Positive for myalgias.  Skin: Positive for wound.  Neurological: Negative for syncope.  Hematological: Does not bruise/bleed easily.  Psychiatric/Behavioral: Negative for confusion.     Physical Exam Updated Vital Signs BP 116/72   Pulse 98   Temp (!) 97.5 F (36.4 C) (Oral)   Resp 14   LMP 12/05/2017   SpO2 96%   Physical Exam    Constitutional: She appears well-developed and well-nourished. No distress.  HENT:  Head: Normocephalic and atraumatic.  Mouth/Throat: Oropharynx is clear and moist.  Eyes: Pupils are equal, round, and reactive to light. Conjunctivae and EOM are normal.  Neck: Normal range of motion. Neck supple.  Cardiovascular: Normal rate, regular rhythm and normal heart sounds.  Pulmonary/Chest: Effort normal and breath sounds normal. No respiratory distress. She has no wheezes. She has no rales.  Abdominal: Bowel sounds are normal. She exhibits distension. She exhibits no mass. There is tenderness.  Musculoskeletal: Normal range of motion. She exhibits edema.  Trace bilateral lower extremity edema.  Equal bilaterally.  Since lumbar wound without any evidence of infection.  Dressing still in place.  No discharge no surrounding erythema.  No significant tenderness to palpation.  Neurological:  She is alert. No cranial nerve deficit or sensory deficit. She exhibits normal muscle tone. Coordination normal.  Skin: Skin is warm. No rash noted.  Nursing note and vitals reviewed.    ED Treatments / Results  Labs (all labs ordered are listed, but only abnormal results are displayed) Labs Reviewed  CBC WITH DIFFERENTIAL/PLATELET - Abnormal; Notable for the following components:      Result Value   WBC 11.0 (*)    RBC 3.79 (*)    Hemoglobin 11.4 (*)    HCT 35.7 (*)    RDW 17.1 (*)    All other components within normal limits  COMPREHENSIVE METABOLIC PANEL - Abnormal; Notable for the following components:   Chloride 95 (*)    CO2 33 (*)    Glucose, Bld 108 (*)    BUN 24 (*)    Total Protein 6.3 (*)    Albumin 3.3 (*)    All other components within normal limits  URINALYSIS, ROUTINE W REFLEX MICROSCOPIC - Abnormal; Notable for the following components:   APPearance CLOUDY (*)    Hgb urine dipstick LARGE (*)    RBC / HPF >50 (*)    All other components within normal limits  I-STAT CHEM 8, ED -  Abnormal; Notable for the following components:   Chloride 94 (*)    BUN 28 (*)    Glucose, Bld 113 (*)    TCO2 35 (*)    Hemoglobin 11.2 (*)    HCT 33.0 (*)    All other components within normal limits  LIPASE, BLOOD    EKG EKG Interpretation  Date/Time:  Friday December 08 2017 17:43:42 EDT Ventricular Rate:  106 PR Interval:  120 QRS Duration: 74 QT Interval:  330 QTC Calculation: 438 R Axis:   79 Text Interpretation:  Sinus tachycardia Otherwise normal ECG No significant change since last tracing june 2019 Confirmed by Marily Memos [phone]) on 12/08/2017 6:55:43 PM Also confirmed by Vanetta Mulders [phone])  on 12/08/2017 7:39:02 PM   Radiology Dg Chest 2 View  Result Date: 12/08/2017 CLINICAL DATA:  Shortness of breath EXAM: CHEST - 2 VIEW COMPARISON:  11/10/2016 FINDINGS: The heart size and mediastinal contours are within normal limits. Both lungs are clear. The visualized skeletal structures are unremarkable. Low lung volumes. IMPRESSION: No active cardiopulmonary disease. Electronically Signed   By: Jasmine Pang M.D.   On: 12/08/2017 18:29   Ct Angio Chest Pe W/cm &/or Wo Cm  Result Date: 12/08/2017 CLINICAL DATA:  Recent back surgery, increased shortness of breath with abdominal swelling EXAM: CT ANGIOGRAPHY CHEST CT ABDOMEN AND PELVIS WITH CONTRAST TECHNIQUE: Multidetector CT imaging of the chest was performed using the standard protocol during bolus administration of intravenous contrast. Multiplanar CT image reconstructions and MIPs were obtained to evaluate the vascular anatomy. Multidetector CT imaging of the abdomen and pelvis was performed using the standard protocol during bolus administration of intravenous contrast. CONTRAST:  ISOVUE-370 IOPAMIDOL (ISOVUE-370) INJECTION 76% COMPARISON:  CT abdomen pelvis 05/01/2016, CT chest 06/30/2016 FINDINGS: CTA CHEST FINDINGS Cardiovascular: Poor contrast opacification of the pulmonary arterial system limits evaluation for  emboli. No acute central embolus is seen. Nonaneurysmal aorta. Normal heart size. No pericardial effusion Mediastinum/Nodes: No enlarged mediastinal, hilar, or axillary lymph nodes. Thyroid gland, trachea, and esophagus demonstrate no significant findings. Lungs/Pleura: Lungs are clear. No pleural effusion or pneumothorax. Musculoskeletal: No chest wall abnormality. No acute or significant osseous findings. Review of the MIP images confirms the above findings. CT ABDOMEN and  PELVIS FINDINGS Hepatobiliary: Hepatic steatosis. No calcified gallstone or biliary dilatation. Focal fat sparing near the gallbladder fossa Pancreas: Unremarkable. No pancreatic ductal dilatation or surrounding inflammatory changes. Spleen: Normal in size without focal abnormality. Adrenals/Urinary Tract: Adrenal glands are unremarkable. Kidneys are normal, without renal calculi, focal lesion, or hydronephrosis. Bladder is unremarkable. Stomach/Bowel: Stomach is within normal limits. Appendix not well seen but no right lower quadrant inflammatory process. No evidence of bowel wall thickening, distention, or inflammatory changes. Vascular/Lymphatic: Nonaneurysmal aorta.  No significant adenopathy Reproductive: Uterus and bilateral adnexa are unremarkable. Other: Skin thickening in the periumbilical region with diffuse subcutaneous edema of the abdominal wall. Musculoskeletal: Small amount of soft tissue stranding and gas posteriorly at L4-L5, felt secondary to recent operative status. Degenerative changes at L4-L5 and L5-S1. Review of the MIP images confirms the above findings. IMPRESSION: 1. Very limited contrast bolus for evaluation of PE. No gross acute central embolus is seen. The lung fields are clear. 2. No CT evidence for acute intra-abdominal or pelvic abnormality. 3. Hepatic steatosis 4. Skin thickening in the periumbilical region and lower anterior abdominal wall with mild to moderate diffuse subcutaneous edema. 5. Small amount of gas  within the posterior soft tissues at L4-L5 presumably postoperative Electronically Signed   By: Jasmine PangKim  Fujinaga M.D.   On: 12/08/2017 21:22   Ct Abdomen Pelvis W Contrast  Result Date: 12/08/2017 CLINICAL DATA:  Recent back surgery, increased shortness of breath with abdominal swelling EXAM: CT ANGIOGRAPHY CHEST CT ABDOMEN AND PELVIS WITH CONTRAST TECHNIQUE: Multidetector CT imaging of the chest was performed using the standard protocol during bolus administration of intravenous contrast. Multiplanar CT image reconstructions and MIPs were obtained to evaluate the vascular anatomy. Multidetector CT imaging of the abdomen and pelvis was performed using the standard protocol during bolus administration of intravenous contrast. CONTRAST:  100mL ISOVUE-370 IOPAMIDOL (ISOVUE-370) INJECTION 76% COMPARISON:  CT abdomen pelvis 05/01/2016, CT chest 06/30/2016 FINDINGS: CTA CHEST FINDINGS Cardiovascular: Poor contrast opacification of the pulmonary arterial system limits evaluation for emboli. No acute central embolus is seen. Nonaneurysmal aorta. Normal heart size. No pericardial effusion Mediastinum/Nodes: No enlarged mediastinal, hilar, or axillary lymph nodes. Thyroid gland, trachea, and esophagus demonstrate no significant findings. Lungs/Pleura: Lungs are clear. No pleural effusion or pneumothorax. Musculoskeletal: No chest wall abnormality. No acute or significant osseous findings. Review of the MIP images confirms the above findings. CT ABDOMEN and PELVIS FINDINGS Hepatobiliary: Hepatic steatosis. No calcified gallstone or biliary dilatation. Focal fat sparing near the gallbladder fossa Pancreas: Unremarkable. No pancreatic ductal dilatation or surrounding inflammatory changes. Spleen: Normal in size without focal abnormality. Adrenals/Urinary Tract: Adrenal glands are unremarkable. Kidneys are normal, without renal calculi, focal lesion, or hydronephrosis. Bladder is unremarkable. Stomach/Bowel: Stomach is within  normal limits. Appendix not well seen but no right lower quadrant inflammatory process. No evidence of bowel wall thickening, distention, or inflammatory changes. Vascular/Lymphatic: Nonaneurysmal aorta.  No significant adenopathy Reproductive: Uterus and bilateral adnexa are unremarkable. Other: Skin thickening in the periumbilical region with diffuse subcutaneous edema of the abdominal wall. Musculoskeletal: Small amount of soft tissue stranding and gas posteriorly at L4-L5, felt secondary to recent operative status. Degenerative changes at L4-L5 and L5-S1. Review of the MIP images confirms the above findings. IMPRESSION: 1. Very limited contrast bolus for evaluation of PE. No gross acute central embolus is seen. The lung fields are clear. 2. No CT evidence for acute intra-abdominal or pelvic abnormality. 3. Hepatic steatosis 4. Skin thickening in the periumbilical region and lower anterior abdominal  wall with mild to moderate diffuse subcutaneous edema. 5. Small amount of gas within the posterior soft tissues at L4-L5 presumably postoperative Electronically Signed   By: Jasmine Pang M.D.   On: 12/08/2017 21:22    Procedures Procedures (including critical care time)  Medications Ordered in ED Medications  0.9 %  sodium chloride infusion ( Intravenous New Bag/Given 12/08/17 2018)  ondansetron (ZOFRAN) injection 4 mg (4 mg Intravenous Given 12/08/17 2018)  HYDROmorphone (DILAUDID) injection 1 mg (1 mg Intravenous Given 12/08/17 2018)  iopamidol (ISOVUE-370) 76 % injection 100 mL (100 mLs Intravenous Contrast Given 12/08/17 2101)     Initial Impression / Assessment and Plan / ED Course  I have reviewed the triage vital signs and the nursing notes.  Pertinent labs & imaging results that were available during my care of the patient were reviewed by me and considered in my medical decision making (see chart for details).    Patient with extensive work-up following the postop discectomy right side  L4-L5.  CT angios chest although somewhat limited without evidence of any large pulmonary embolus.  No other acute abnormalities.  CT abdomen with no intra-abdominal process at all.  Patient's labs mild leukocytosis at 11,000.  Liver function test without significant abnormalities.  No significant renal dysfunction.  Urinalysis did have hematuria will question patient whether she is on her menses or not.  Patient's lumbar wound without evidence of infection.  Patient was significant improvement with pain medication.  Some symptomatology just may have been pain related.  Patient is on her menses.  So this explains that the blood in the urine.  Feel secondary to that.  Patient stable for discharge home.  Final Clinical Impressions(s) / ED Diagnoses   Final diagnoses:  SOB (shortness of breath)  Generalized abdominal pain  Post-operative state    ED Discharge Orders    None       Vanetta Mulders, MD 12/08/17 2147

## 2017-12-08 NOTE — ED Triage Notes (Signed)
Pt here today for increased SOB starting today. Pt had back surgery Tuesday of this week. Pt complaining of swelling to abd/ and trouble breathing mainly on left side

## 2017-12-16 ENCOUNTER — Emergency Department (HOSPITAL_COMMUNITY)
Admission: EM | Admit: 2017-12-16 | Discharge: 2017-12-16 | Disposition: A | Discharge status: Home / Self Care | Payer: Self-pay | Attending: Emergency Medicine | Admitting: Emergency Medicine

## 2017-12-16 ENCOUNTER — Emergency Department (HOSPITAL_COMMUNITY): Payer: Self-pay

## 2017-12-16 ENCOUNTER — Encounter (HOSPITAL_COMMUNITY): Payer: Self-pay

## 2017-12-16 ENCOUNTER — Other Ambulatory Visit: Payer: Self-pay

## 2017-12-16 DIAGNOSIS — R51 Headache: Secondary | ICD-10-CM | POA: Insufficient documentation

## 2017-12-16 DIAGNOSIS — E039 Hypothyroidism, unspecified: Secondary | ICD-10-CM | POA: Insufficient documentation

## 2017-12-16 DIAGNOSIS — R519 Headache, unspecified: Secondary | ICD-10-CM

## 2017-12-16 DIAGNOSIS — Z79899 Other long term (current) drug therapy: Secondary | ICD-10-CM | POA: Insufficient documentation

## 2017-12-16 DIAGNOSIS — I10 Essential (primary) hypertension: Secondary | ICD-10-CM | POA: Insufficient documentation

## 2017-12-16 HISTORY — DX: Radiculopathy, lumbar region: M54.16

## 2017-12-16 HISTORY — DX: Migraine, unspecified, not intractable, without status migrainosus: G43.909

## 2017-12-16 HISTORY — DX: Other chronic pain: G89.29

## 2017-12-16 HISTORY — DX: Dorsalgia, unspecified: M54.9

## 2017-12-16 LAB — I-STAT CHEM 8, ED
BUN: 14 mg/dL (ref 6–20)
CHLORIDE: 103 mmol/L (ref 98–111)
CREATININE: 0.7 mg/dL (ref 0.44–1.00)
Calcium, Ion: 1.14 mmol/L — ABNORMAL LOW (ref 1.15–1.40)
Glucose, Bld: 127 mg/dL — ABNORMAL HIGH (ref 70–99)
HEMATOCRIT: 42 % (ref 36.0–46.0)
Hemoglobin: 14.3 g/dL (ref 12.0–15.0)
POTASSIUM: 3.9 mmol/L (ref 3.5–5.1)
SODIUM: 138 mmol/L (ref 135–145)
TCO2: 22 mmol/L (ref 22–32)

## 2017-12-16 LAB — I-STAT BETA HCG BLOOD, ED (MC, WL, AP ONLY)

## 2017-12-16 MED ORDER — METOCLOPRAMIDE HCL 10 MG PO TABS: 10.0000 mg | ORAL_TABLET | Freq: Four times a day (QID) | ORAL | 0 refills | Status: DC | PRN

## 2017-12-16 MED ORDER — MORPHINE SULFATE (PF) 4 MG/ML IV SOLN
4.0000 mg | INTRAVENOUS | Status: AC | PRN
  Administered 2017-12-16 (×2): 4 mg via INTRAVENOUS
  Filled 2017-12-16 (×2): qty 1

## 2017-12-16 MED ORDER — METOCLOPRAMIDE HCL 5 MG/ML IJ SOLN
10.0000 mg | Freq: Once | INTRAMUSCULAR | Status: AC
  Administered 2017-12-16: 10 mg via INTRAVENOUS
  Filled 2017-12-16: qty 2

## 2017-12-16 MED ORDER — DIPHENHYDRAMINE HCL 50 MG/ML IJ SOLN
50.0000 mg | Freq: Once | INTRAMUSCULAR | Status: AC
  Administered 2017-12-16: 50 mg via INTRAVENOUS
  Filled 2017-12-16: qty 1

## 2017-12-16 MED ORDER — SODIUM CHLORIDE 0.9 % IV BOLUS
1000.0000 mL | Freq: Once | INTRAVENOUS | Status: AC
Start: 2017-12-16 — End: 2017-12-16
  Administered 2017-12-16: 1000 mL via INTRAVENOUS

## 2017-12-16 MED ORDER — OXYCODONE-ACETAMINOPHEN 5-325 MG PO TABS: ORAL_TABLET | ORAL | 0 refills | Status: DC

## 2017-12-16 MED ORDER — IOPAMIDOL (ISOVUE-370) INJECTION 76%
80.0000 mL | Freq: Once | INTRAVENOUS | Status: AC | PRN
  Administered 2017-12-16: 80 mL via INTRAVENOUS

## 2017-12-16 MED ORDER — ACETAMINOPHEN 325 MG PO TABS
650.0000 mg | ORAL_TABLET | Freq: Once | ORAL | Status: AC
  Administered 2017-12-16: 650 mg via ORAL
  Filled 2017-12-16: qty 2

## 2017-12-16 MED ORDER — DEXAMETHASONE SODIUM PHOSPHATE 4 MG/ML IJ SOLN
10.0000 mg | Freq: Once | INTRAMUSCULAR | Status: AC
  Administered 2017-12-16: 10 mg via INTRAVENOUS
  Filled 2017-12-16: qty 3

## 2017-12-16 MED ORDER — KETOROLAC TROMETHAMINE 30 MG/ML IJ SOLN
30.0000 mg | Freq: Once | INTRAMUSCULAR | Status: AC
  Administered 2017-12-16: 30 mg via INTRAVENOUS
  Filled 2017-12-16: qty 1

## 2017-12-16 MED ORDER — SODIUM CHLORIDE 0.9 % IV BOLUS
1000.0000 mL | Freq: Once | INTRAVENOUS | Status: AC
  Administered 2017-12-16: 1000 mL via INTRAVENOUS

## 2017-12-16 MED ORDER — ONDANSETRON HCL 4 MG/2ML IJ SOLN
4.0000 mg | Freq: Once | INTRAMUSCULAR | Status: AC
  Administered 2017-12-16: 4 mg via INTRAVENOUS
  Filled 2017-12-16: qty 2

## 2017-12-16 NOTE — Discharge Instructions (Addendum)
Take over the counter tylenol, ibuprofen and benadryl, as directed on packaging, with the reglan prescription given to you today, as needed for headache. Do not take the percocet and tylenol together because percocet contains tylenol. Rest at home as much as possible. Increase your fluids intake for the next several days. Call your regular Neurosurgeon on Monday to schedule a follow up appointment within the next 3 to 4 days.  Return to the Emergency Department immediately sooner if worsening.

## 2017-12-16 NOTE — ED Notes (Signed)
MD aware of pt.

## 2017-12-16 NOTE — ED Provider Notes (Signed)
[facility] EMERGENCY DEPARTMENT Provider Note   CSN: [id number] Arrival date & time: 12/16/17  1825     History   Chief Complaint Chief Complaint  Patient presents with  . Headache    HPI Sonya Gibson is a 32 y.o. female.  HPI  Pt was seen at 1840. Per pt, c/o gradual onset and persistence of constant generalized headache that began at 1745 PTA.Marland Kitchen  Describes the headache as "pressure" in her entire head that began after she sat up on the side of her bed at home, and worsened over several minutes. Has been associated with nausea. States she has been on bedrest since s/p right L4-L5 microdiscectomy on 12/05/2017. Denies headache was immediately maximal at onset/thunderclap/onset <1second.  Denies any new or changed back pain s/p surgery. Denies visual changes, no focal motor weakness, no tingling/numbness in extremities, no fevers, no neck pain, no rash, no injury.     Past Medical History:  Diagnosis Date  . Anxiety   . Chronic back pain   . Depression   . Essential hypertension   . GERD (gastroesophageal reflux disease)   . Hypothyroidism   . Lumbar radiculopathy, right   . Migraine headache   . PTSD (post-traumatic stress disorder)     Patient Active Problem List   Diagnosis Date Noted  . Lumbar radiculopathy   . S/P lumbar laminectomy 12/05/2017  . Perineal numbness 11/30/2017  . Weakness of right lower extremity 11/17/2017  . Lower back pain 11/15/2017  . Hypothyroidism   . Anxiety   . Essential hypertension   . PTSD (post-traumatic stress disorder)   . Decreased sensation of leg   . GESTATIONAL DIABETES 04/26/2010    Past Surgical History:  Procedure Laterality Date  . ANTERIOR CRUCIATE LIGAMENT REPAIR Left   . CESAREAN SECTION    . IR EPIDUROGRAPHY  11/17/2017  . LUMBAR LAMINECTOMY/DECOMPRESSION MICRODISCECTOMY Right 12/05/2017   Procedure: Right L4-5 Microdiskectomy;  Surgeon: Tia Alert, MD;  Location: [facility] OR;  Service: Neurosurgery;  Laterality:  Right;  . middle finger reatachment       OB History   None      Home Medications    Prior to Admission medications   Medication Sig Start Date End Date Taking? Authorizing Provider  calcium carbonate (TUMS EX) 750 MG chewable tablet Chew 2 tablets by mouth as needed for heartburn.    [provider]  clonazePAM (KLONOPIN) 2 MG tablet Take 1 tablet (2 mg total) by mouth 2 (two) times daily for 15 doses. 12/06/17 12/14/17  Osvaldo Shipper, MD  cyclobenzaprine (FLEXERIL) 10 MG tablet Take 1 tablet (10 mg total) by mouth 3 (three) times daily as needed for muscle spasms. 12/06/17   Osvaldo Shipper, MD  gabapentin (NEURONTIN) 300 MG capsule Take 2 capsules (600 mg total) by mouth 3 (three) times daily. 11/21/17 12/21/17  Amin, Loura Halt, MD  ibuprofen (ADVIL,MOTRIN) 200 MG tablet Take 800 mg by mouth as needed for moderate pain.    [provider]  levothyroxine (SYNTHROID, LEVOTHROID) 50 MCG tablet Take 50 mcg by mouth at bedtime.  06/06/17   [provider]  lisinopril-hydrochlorothiazide (PRINZIDE,ZESTORETIC) 10-12.5 MG tablet Take 1 tablet by mouth daily. 10/27/17   [provider]  Melatonin 10 MG TABS Take 10 mg by mouth daily as needed (sleep).    [provider]  neomycin-bacitracin-polymyxin (NEOSPORIN) OINT Apply 1 application topically 2 (two) times daily. 12/06/17   Osvaldo Shipper, MD  ondansetron (ZOFRAN ODT) 8 MG disintegrating  tablet Take 1 tablet (8 mg total) by mouth every 8 (eight) hours as needed for up to 20 doses for nausea or vomiting. 11/21/17   Amin, Loura Halt, MD  promethazine (PHENERGAN) 12.5 MG tablet Take 1 tablet (12.5 mg total) by mouth every 6 (six) hours as needed for refractory nausea / vomiting. 11/21/17   Amin, Loura Halt, MD  senna (SENOKOT) 8.6 MG TABS tablet Take 1 tablet (8.6 mg total) by mouth 2 (two) times daily. 12/06/17   Osvaldo Shipper, MD  traZODone (DESYREL) 150 MG tablet Take 150 mg by mouth at bedtime as  needed for sleep.    [provider]  venlafaxine XR (EFFEXOR-XR) 150 MG 24 hr capsule Take 225 mg by mouth at bedtime. 150mg  and 75mg  nightly 06/06/17 11/30/17  [provider]    Family History Family History  Problem Relation Age of Onset  . Diabetes Mellitus II Mother   . Hypertension Mother   . Seizures Mother   . Hypertension Father   . Diabetes Mellitus II Father   . Hyperlipidemia Father   . Bipolar disorder Sister     Social History Social History   Tobacco Use  . Smoking status: Never Smoker  . Smokeless tobacco: Never Used  Substance Use Topics  . Alcohol use: Yes    Comment: rare  . Drug use: No     Allergies   Coconut oil and Nitrofurantoin   Review of Systems Review of Systems ROS: Statement: All systems negative except as marked or noted in the HPI; Constitutional: Negative for fever and chills. ; ; Eyes: Negative for eye pain, redness and discharge. ; ; ENMT: Negative for ear pain, hoarseness, nasal congestion, sinus pressure and sore throat. ; ; Cardiovascular: Negative for chest pain, palpitations, diaphoresis, dyspnea and peripheral edema. ; ; Respiratory: Negative for cough, wheezing and stridor. ; ; Gastrointestinal: +nausea. Negative for vomiting, diarrhea, abdominal pain, blood in stool, hematemesis, jaundice and rectal bleeding. . ; ; Genitourinary: Negative for dysuria, flank pain and hematuria. ; ; Musculoskeletal: Negative for back pain and neck pain. Negative for swelling and trauma.; ; Skin: Negative for pruritus, rash, abrasions, blisters, bruising and skin lesion.; ; Neuro: +headache. Negative for lightheadedness and neck stiffness. Negative for weakness, altered level of consciousness, altered mental status, extremity weakness, paresthesias, involuntary movement, seizure and syncope.       Physical Exam Updated Vital Signs BP 102/89 (BP Location: Right Arm)   Pulse (!) 126   Temp (!) 97.3 F (36.3 C) (Oral)   Resp 14    LMP 12/05/2017   SpO2 95%    BP 113/79   Pulse 96   Temp (!) 97.3 F (36.3 C) (Oral)   Resp (!) 24   LMP 12/05/2017   SpO2 98%    Patient Vitals for the past 24 hrs:  BP Temp Temp src Pulse Resp SpO2  12/16/17 2130 111/73 - - (!) 101 16 97 %  12/16/17 2100 109/65 - - (!) 104 13 95 %  12/16/17 2030 99/65 - - (!) 101 12 98 %  12/16/17 2008 119/71 - - (!) 108 20 96 %  12/16/17 2000 - - - (!) 118 18 94 %  12/16/17 1947 - - - (!) 105 15 98 %  12/16/17 1845 - - - (!) 126 14 95 %  12/16/17 1835 102/89 (!) 97.3 F (36.3 C) Oral (!) 134 14 93 %     Physical Exam 1845: Physical examination:  Nursing notes reviewed; Vital  signs and O2 SAT reviewed;  Constitutional: Well developed, Well nourished, Well hydrated, Crying.; Head:  Normocephalic, atraumatic; Eyes: EOMI, PERRL, No scleral icterus; ENMT: Mouth and pharynx normal, Mucous membranes moist; Neck: Supple, Full range of motion, No lymphadenopathy; Cardiovascular: Tachycardic rate and rhythm, No gallop; Respiratory: Breath sounds clear & equal bilaterally, No wheezes.  Speaking full sentences with ease, Normal respiratory effort/excursion; Chest: Nontender, Movement normal; Abdomen: Soft, Nontender, Nondistended, Normal bowel sounds; Genitourinary: No CVA tenderness; Spine:  No midline CS, TS, LS tenderness. +LS surgical wound clean/dry/intact. No drainage, no fluctuance. Erythema where bandage/tape located.;;  Extremities: Peripheral pulses normal, No tenderness, No edema, No calf edema or asymmetry.; Neuro: AA&Ox3, Major CN grossly intact. No facial droop. Speech clear. No gross focal motor or sensory deficits in extremities.; Skin: Color normal, Warm, Dry.; Psych:  Very anxious.     ED Treatments / Results  Labs (all labs ordered are listed, but only abnormal results are displayed)   EKG None  Radiology   Procedures Procedures (including critical care time)  Medications Ordered in ED Medications  sodium chloride 0.9 %  bolus 1,000 mL (1,000 mLs Intravenous New Bag/Given 12/16/17 1852)  morphine 4 MG/ML injection 4 mg (4 mg Intravenous Given 12/16/17 1852)  ondansetron (ZOFRAN) injection 4 mg (4 mg Intravenous Given 12/16/17 1846)  iopamidol (ISOVUE-370) 76 % injection 80 mL (80 mLs Intravenous Contrast Given 12/16/17 1906)     Initial Impression / Assessment and Plan / ED Course  I have reviewed the triage vital signs and the nursing notes.  Pertinent labs & imaging results that were available during my care of the patient were reviewed by me and considered in my medical decision making (see chart for details).  MDM Reviewed: previous chart, nursing note and vitals Reviewed previous: labs Interpretation: labs and CT scan   Results for orders placed or performed during the hospital encounter of 12/16/17  I-Stat beta hCG blood, ED  Result Value Ref Range   I-stat hCG, quantitative <5.0 <5 mIU/mL   Comment 3          I-stat Chem 8, ED  Result Value Ref Range   Sodium 138 135 - 145 mmol/L   Potassium 3.9 3.5 - 5.1 mmol/L   Chloride 103 98 - 111 mmol/L   BUN 14 6 - 20 mg/dL   Creatinine, Ser 6.570.70 0.44 - 1.00 mg/dL   Glucose, Bld 846127 (H) 70 - 99 mg/dL   Calcium, Ion 9.621.14 (L) 1.15 - 1.40 mmol/L   TCO2 22 22 - 32 mmol/L   Hemoglobin 14.3 12.0 - 15.0 g/dL   HCT 95.242.0 84.136.0 - 32.446.0 %    Ct Angio Head W/cm &/or Wo Cm Result Date: 12/16/2017 CLINICAL DATA:  [age] y/o F; spinal surgery 2 weeks ago. Now presenting with intense headache. EXAM: CT ANGIOGRAPHY HEAD TECHNIQUE: Multidetector CT imaging of the head was performed using the standard protocol during bolus administration of intravenous contrast. Multiplanar CT image reconstructions and MIPs were obtained to evaluate the vascular anatomy. CONTRAST:  80mL ISOVUE-370 IOPAMIDOL (ISOVUE-370) INJECTION 76% COMPARISON:  08/14/2016 CT head FINDINGS: CT HEAD Brain: No evidence of acute infarction, hemorrhage, hydrocephalus, extra-axial collection or mass lesion/mass  effect. Vascular: No hyperdense vessel or unexpected calcification. Skull: Normal. Negative for fracture or focal lesion. Sinuses: Imaged portions are clear. Orbits: No acute finding. CTA HEAD Anterior circulation: No significant stenosis, proximal occlusion, aneurysm, or vascular malformation. Posterior circulation: No significant stenosis, proximal occlusion, aneurysm, or vascular malformation. Venous sinuses: As permitted  by contrast timing, patent. Anatomic variants: Complete circle-of-Willis. Delayed phase: No abnormal intracranial enhancement. IMPRESSION: Normal CTA of the head. Electronically Signed   By: Mitzi Hansen M.D.   On: 12/16/2017 20:00     2050:  Per [facility] Rule and CT/CT-A head negative within 2 hours of onset of headache; SAH ruled out.  T/C returned from [facility] Neurosurgeon Dr. Jordan Likes, case discussed, including:  HPI, pertinent PM/SHx, VS/PE, dx testing, ED course and treatment:  He has reviewed the chart in Epic, agrees with ED workup and no further testing needed at this time, patients can have delayed CSF headache s/p surgery which may also be a possibility, tx rest at home, pain meds, f/u office this week; OK to give decadron and toradol while in ED to tx headache.   2200:  Pt is calmer now. States she feels better after meds and is ready to go home now. Husband and pt state she only has "a few days left" of pain meds rx and would need refill.  Dx and testing, as well as d/w Neurosurgeon, d/w pt and family.  Questions answered.  Verb understanding, agreeable to d/c home with outpt f/u.         Final Clinical Impressions(s) / ED Diagnoses   Final diagnoses:  None    ED Discharge Orders    None       Samuel Jester, DO 12/19/17 2153

## 2017-12-16 NOTE — ED Triage Notes (Signed)
Pt has had a spinal surgery 2 weeks ago.Pt had herniated discs on L4 and L5 and had them removed. Has been on bed rest since then. Pt stated she is now having intense head pain since 5:45 pm. Pt not able to open eyes without pain. Is alert and oriented. No neuro deficits noted.

## 2017-12-17 NOTE — ED Provider Notes (Signed)
[facility] [place], who was concerned that this patient is misusing narcotics.  Dr. Clarene DukeMcManus wrote a prescription for Percocet No. 12, yesterday.  The day prior to that the patient had a prescription for oxycodone No. 20 filled at a pharmacy in [facility] [place].  I told the pharmacist to void the prescription written by Dr. Clarene DukeMcManus.   Patient is diverting narcotics, illegally.   Mancel BaleWentz, Konrad Hoak, MD 12/17/17 1057

## 2017-12-20 ENCOUNTER — Encounter (HOSPITAL_COMMUNITY)
Admission: EM | Disposition: A | Discharge status: Home Health | Payer: Self-pay | Source: Home / Self Care | Attending: Neurological Surgery

## 2017-12-20 ENCOUNTER — Inpatient Hospital Stay (HOSPITAL_COMMUNITY): Payer: BLUE CROSS/BLUE SHIELD

## 2017-12-20 ENCOUNTER — Inpatient Hospital Stay (HOSPITAL_COMMUNITY)
Admission: EM | Admit: 2017-12-20 | Discharge: 2018-01-16 | DRG: 029 | Disposition: A | Discharge status: Home Health | Payer: BLUE CROSS/BLUE SHIELD | Attending: Neurological Surgery | Admitting: Neurological Surgery

## 2017-12-20 ENCOUNTER — Inpatient Hospital Stay (HOSPITAL_COMMUNITY): Payer: BLUE CROSS/BLUE SHIELD | Admitting: Certified Registered Nurse Anesthetist

## 2017-12-20 ENCOUNTER — Encounter (HOSPITAL_COMMUNITY): Payer: Self-pay | Admitting: Emergency Medicine

## 2017-12-20 ENCOUNTER — Other Ambulatory Visit: Payer: Self-pay

## 2017-12-20 DIAGNOSIS — E119 Type 2 diabetes mellitus without complications: Secondary | ICD-10-CM | POA: Diagnosis not present

## 2017-12-20 DIAGNOSIS — G9619 Other disorders of meninges, not elsewhere classified: Secondary | ICD-10-CM | POA: Diagnosis not present

## 2017-12-20 DIAGNOSIS — G9782 Other postprocedural complications and disorders of nervous system: Principal | ICD-10-CM | POA: Diagnosis present

## 2017-12-20 DIAGNOSIS — G9600 Cerebrospinal fluid leak, unspecified: Secondary | ICD-10-CM | POA: Diagnosis present

## 2017-12-20 DIAGNOSIS — R1110 Vomiting, unspecified: Secondary | ICD-10-CM | POA: Diagnosis not present

## 2017-12-20 DIAGNOSIS — Z4803 Encounter for change or removal of drains: Secondary | ICD-10-CM | POA: Diagnosis not present

## 2017-12-20 DIAGNOSIS — Z833 Family history of diabetes mellitus: Secondary | ICD-10-CM

## 2017-12-20 DIAGNOSIS — R609 Edema, unspecified: Secondary | ICD-10-CM | POA: Diagnosis not present

## 2017-12-20 DIAGNOSIS — Y848 Other medical procedures as the cause of abnormal reaction of the patient, or of later complication, without mention of misadventure at the time of the procedure: Secondary | ICD-10-CM | POA: Diagnosis present

## 2017-12-20 DIAGNOSIS — K219 Gastro-esophageal reflux disease without esophagitis: Secondary | ICD-10-CM | POA: Diagnosis not present

## 2017-12-20 DIAGNOSIS — F4310 Post-traumatic stress disorder, unspecified: Secondary | ICD-10-CM | POA: Diagnosis not present

## 2017-12-20 DIAGNOSIS — Z9119 Patient's noncompliance with other medical treatment and regimen: Secondary | ICD-10-CM | POA: Diagnosis not present

## 2017-12-20 DIAGNOSIS — L8990 Pressure ulcer of unspecified site, unspecified stage: Secondary | ICD-10-CM

## 2017-12-20 DIAGNOSIS — Z8249 Family history of ischemic heart disease and other diseases of the circulatory system: Secondary | ICD-10-CM

## 2017-12-20 DIAGNOSIS — G970 Cerebrospinal fluid leak from spinal puncture: Secondary | ICD-10-CM | POA: Diagnosis not present

## 2017-12-20 DIAGNOSIS — F418 Other specified anxiety disorders: Secondary | ICD-10-CM | POA: Diagnosis not present

## 2017-12-20 DIAGNOSIS — Z6841 Body Mass Index (BMI) 40.0 and over, adult: Secondary | ICD-10-CM

## 2017-12-20 DIAGNOSIS — G9611 Dural tear: Secondary | ICD-10-CM | POA: Diagnosis present

## 2017-12-20 DIAGNOSIS — Z419 Encounter for procedure for purposes other than remedying health state, unspecified: Secondary | ICD-10-CM

## 2017-12-20 DIAGNOSIS — R339 Retention of urine, unspecified: Secondary | ICD-10-CM | POA: Diagnosis not present

## 2017-12-20 DIAGNOSIS — I10 Essential (primary) hypertension: Secondary | ICD-10-CM | POA: Diagnosis present

## 2017-12-20 DIAGNOSIS — E039 Hypothyroidism, unspecified: Secondary | ICD-10-CM | POA: Diagnosis not present

## 2017-12-20 DIAGNOSIS — M5127 Other intervertebral disc displacement, lumbosacral region: Secondary | ICD-10-CM | POA: Diagnosis not present

## 2017-12-20 DIAGNOSIS — F419 Anxiety disorder, unspecified: Secondary | ICD-10-CM | POA: Diagnosis not present

## 2017-12-20 DIAGNOSIS — Z818 Family history of other mental and behavioral disorders: Secondary | ICD-10-CM | POA: Diagnosis not present

## 2017-12-20 DIAGNOSIS — G8918 Other acute postprocedural pain: Secondary | ICD-10-CM

## 2017-12-20 DIAGNOSIS — R51 Headache: Secondary | ICD-10-CM | POA: Diagnosis not present

## 2017-12-20 DIAGNOSIS — T8131XA Disruption of external operation (surgical) wound, not elsewhere classified, initial encounter: Secondary | ICD-10-CM | POA: Diagnosis not present

## 2017-12-20 DIAGNOSIS — M545 Low back pain: Secondary | ICD-10-CM | POA: Diagnosis not present

## 2017-12-20 DIAGNOSIS — Z9889 Other specified postprocedural states: Secondary | ICD-10-CM

## 2017-12-20 HISTORY — DX: Malingerer (conscious simulation): Z76.5

## 2017-12-20 HISTORY — PX: LUMBAR LAMINECTOMY/DECOMPRESSION MICRODISCECTOMY: SHX5026

## 2017-12-20 LAB — CBC WITH DIFFERENTIAL/PLATELET
Abs Immature Granulocytes: 0.1 10*3/uL (ref 0.0–0.1)
Basophils Absolute: 0.1 10*3/uL (ref 0.0–0.1)
Basophils Relative: 1 %
EOS ABS: 0.4 10*3/uL (ref 0.0–0.7)
EOS PCT: 5 %
HEMATOCRIT: 36.1 % (ref 36.0–46.0)
HEMOGLOBIN: 11.8 g/dL — AB (ref 12.0–15.0)
Immature Granulocytes: 1 %
LYMPHS ABS: 3.5 10*3/uL (ref 0.7–4.0)
LYMPHS PCT: 41 %
MCH: 29.6 pg (ref 26.0–34.0)
MCHC: 32.7 g/dL (ref 30.0–36.0)
MCV: 90.5 fL (ref 78.0–100.0)
MONO ABS: 0.4 10*3/uL (ref 0.1–1.0)
MONOS PCT: 5 %
Neutro Abs: 4 10*3/uL (ref 1.7–7.7)
Neutrophils Relative %: 47 %
Platelets: 247 10*3/uL (ref 150–400)
RBC: 3.99 MIL/uL (ref 3.87–5.11)
RDW: 14.8 % (ref 11.5–15.5)
WBC: 8.4 10*3/uL (ref 4.0–10.5)

## 2017-12-20 LAB — I-STAT BETA HCG BLOOD, ED (MC, WL, AP ONLY): I-stat hCG, quantitative: 7.4 m[IU]/mL — ABNORMAL HIGH (ref <5)

## 2017-12-20 LAB — BASIC METABOLIC PANEL
Anion gap: 10 (ref 5–15)
BUN: 16 mg/dL (ref 6–20)
CALCIUM: 9.2 mg/dL (ref 8.9–10.3)
CHLORIDE: 103 mmol/L (ref 98–111)
CO2: 25 mmol/L (ref 22–32)
CREATININE: 0.89 mg/dL (ref 0.44–1.00)
GFR calc Af Amer: >60 mL/min (ref >60)
GFR calc non Af Amer: >60 mL/min (ref >60)
GLUCOSE: 108 mg/dL — AB (ref 70–99)
Potassium: 4 mmol/L (ref 3.5–5.1)
Sodium: 138 mmol/L (ref 135–145)

## 2017-12-20 LAB — SURGICAL PCR SCREEN
MRSA, PCR: NEGATIVE
STAPHYLOCOCCUS AUREUS: NEGATIVE

## 2017-12-20 LAB — HCG, QUANTITATIVE, PREGNANCY: hCG, Beta Chain, Quant, S: 1 m[IU]/mL (ref <5)

## 2017-12-20 SURGERY — LUMBAR LAMINECTOMY/DECOMPRESSION MICRODISCECTOMY 1 LEVEL
Anesthesia: General | Site: Spine Lumbar

## 2017-12-20 MED ORDER — PROPOFOL 1000 MG/100ML IV EMUL
INTRAVENOUS | Status: AC
  Filled 2017-12-20: qty 100

## 2017-12-20 MED ORDER — THROMBIN 5000 UNITS EX SOLR
CUTANEOUS | Status: AC
  Filled 2017-12-20: qty 15000

## 2017-12-20 MED ORDER — DIPHENHYDRAMINE HCL 50 MG/ML IJ SOLN
25.0000 mg | Freq: Once | INTRAMUSCULAR | Status: AC
  Administered 2017-12-20: 25 mg via INTRAVENOUS
  Filled 2017-12-20: qty 1

## 2017-12-20 MED ORDER — FLUCONAZOLE 150 MG PO TABS: 150.0000 mg | ORAL_TABLET | Freq: Every day | ORAL | Status: DC

## 2017-12-20 MED ORDER — LISINOPRIL 10 MG PO TABS
10.0000 mg | ORAL_TABLET | Freq: Every day | ORAL | Status: DC
  Administered 2017-12-22 – 2018-01-16 (×20): 10 mg via ORAL
  Filled 2017-12-20 (×24): qty 1

## 2017-12-20 MED ORDER — ONDANSETRON HCL 4 MG/2ML IJ SOLN
INTRAMUSCULAR | Status: AC
  Administered 2017-12-20: 4 mg
  Filled 2017-12-20: qty 2

## 2017-12-20 MED ORDER — HYDROMORPHONE HCL 1 MG/ML IJ SOLN
0.2500 mg | INTRAMUSCULAR | Status: DC | PRN
  Administered 2017-12-20 (×3): 0.5 mg via INTRAVENOUS

## 2017-12-20 MED ORDER — SUGAMMADEX SODIUM 200 MG/2ML IV SOLN
INTRAVENOUS | Status: AC
  Filled 2017-12-20: qty 4

## 2017-12-20 MED ORDER — HYDROMORPHONE HCL 1 MG/ML IJ SOLN
0.5000 mg | INTRAMUSCULAR | Status: DC | PRN
  Administered 2017-12-20 (×2): 0.5 mg via INTRAVENOUS
  Filled 2017-12-20 (×2): qty 0.5

## 2017-12-20 MED ORDER — OXYCODONE-ACETAMINOPHEN 5-325 MG PO TABS: 1.0000 | ORAL_TABLET | ORAL | Status: DC | PRN

## 2017-12-20 MED ORDER — SODIUM CHLORIDE 0.9 % IV SOLN
250.0000 mL | INTRAVENOUS | Status: DC
  Administered 2017-12-20 – 2017-12-26 (×3): 250 mL via INTRAVENOUS

## 2017-12-20 MED ORDER — SCOPOLAMINE 1 MG/3DAYS TD PT72
MEDICATED_PATCH | TRANSDERMAL | Status: DC | PRN
  Administered 2017-12-20: 1 via TRANSDERMAL

## 2017-12-20 MED ORDER — HYDROMORPHONE HCL 1 MG/ML IJ SOLN
0.5000 mg | INTRAMUSCULAR | Status: DC | PRN
  Administered 2017-12-20 – 2017-12-22 (×8): 0.5 mg via INTRAVENOUS
  Filled 2017-12-20 (×8): qty 0.5

## 2017-12-20 MED ORDER — ACETAMINOPHEN 650 MG RE SUPP: 650.0000 mg | RECTAL | Status: DC | PRN

## 2017-12-20 MED ORDER — ONDANSETRON 4 MG PO TBDP
8.0000 mg | ORAL_TABLET | Freq: Three times a day (TID) | ORAL | Status: DC | PRN
  Administered 2017-12-20: 8 mg via ORAL
  Filled 2017-12-20: qty 2

## 2017-12-20 MED ORDER — IBUPROFEN 200 MG PO TABS
800.0000 mg | ORAL_TABLET | Freq: Two times a day (BID) | ORAL | Status: DC | PRN
Start: 2017-12-20 — End: 2018-01-01
  Administered 2017-12-21 – 2018-01-01 (×3): 800 mg via ORAL
  Filled 2017-12-20 (×4): qty 4

## 2017-12-20 MED ORDER — FENTANYL CITRATE (PF) 100 MCG/2ML IJ SOLN
INTRAMUSCULAR | Status: DC | PRN
  Administered 2017-12-20 (×2): 50 ug via INTRAVENOUS
  Administered 2017-12-20: 100 ug via INTRAVENOUS
  Administered 2017-12-20 (×2): 50 ug via INTRAVENOUS

## 2017-12-20 MED ORDER — SUGAMMADEX SODIUM 500 MG/5ML IV SOLN
INTRAVENOUS | Status: AC
  Filled 2017-12-20: qty 5

## 2017-12-20 MED ORDER — HYDROCHLOROTHIAZIDE 12.5 MG PO CAPS
12.5000 mg | ORAL_CAPSULE | Freq: Every day | ORAL | Status: DC
  Administered 2017-12-22 – 2018-01-16 (×21): 12.5 mg via ORAL
  Filled 2017-12-20 (×25): qty 1

## 2017-12-20 MED ORDER — HEMOSTATIC AGENTS (NO CHARGE) OPTIME
TOPICAL | Status: DC | PRN
  Administered 2017-12-20 (×3): 1 via TOPICAL

## 2017-12-20 MED ORDER — VENLAFAXINE HCL ER 75 MG PO CP24
225.0000 mg | ORAL_CAPSULE | Freq: Every day | ORAL | Status: DC
  Administered 2017-12-20 – 2018-01-15 (×23): 225 mg via ORAL
  Filled 2017-12-20 (×2): qty 1
  Filled 2017-12-20: qty 3
  Filled 2017-12-20: qty 1
  Filled 2017-12-20: qty 3
  Filled 2017-12-20 (×9): qty 1
  Filled 2017-12-20: qty 3
  Filled 2017-12-20: qty 1
  Filled 2017-12-20: qty 3
  Filled 2017-12-20: qty 1
  Filled 2017-12-20 (×3): qty 3
  Filled 2017-12-20 (×4): qty 1
  Filled 2017-12-20: qty 3
  Filled 2017-12-20 (×2): qty 1

## 2017-12-20 MED ORDER — MENTHOL 3 MG MT LOZG
1.0000 | LOZENGE | OROMUCOSAL | Status: DC | PRN
  Filled 2017-12-20: qty 9

## 2017-12-20 MED ORDER — ONDANSETRON HCL 4 MG/2ML IJ SOLN
INTRAMUSCULAR | Status: DC | PRN
  Administered 2017-12-20: 4 mg via INTRAVENOUS

## 2017-12-20 MED ORDER — PROPOFOL 10 MG/ML IV BOLUS
INTRAVENOUS | Status: AC
  Filled 2017-12-20: qty 40

## 2017-12-20 MED ORDER — DEXTROSE 5 % IV SOLN
INTRAVENOUS | Status: DC | PRN
  Administered 2017-12-20: 3 g via INTRAVENOUS

## 2017-12-20 MED ORDER — SUCCINYLCHOLINE CHLORIDE 200 MG/10ML IV SOSY
PREFILLED_SYRINGE | INTRAVENOUS | Status: DC | PRN
  Administered 2017-12-20: 160 mg via INTRAVENOUS

## 2017-12-20 MED ORDER — LORAZEPAM 2 MG/ML IJ SOLN
1.0000 mg | Freq: Once | INTRAMUSCULAR | Status: AC
  Administered 2017-12-20: 1 mg via INTRAVENOUS
  Filled 2017-12-20: qty 1

## 2017-12-20 MED ORDER — ONDANSETRON HCL 4 MG PO TABS: 4.0000 mg | ORAL_TABLET | Freq: Four times a day (QID) | ORAL | Status: DC | PRN

## 2017-12-20 MED ORDER — FENTANYL CITRATE (PF) 250 MCG/5ML IJ SOLN
INTRAMUSCULAR | Status: AC
  Filled 2017-12-20: qty 5

## 2017-12-20 MED ORDER — LISINOPRIL-HYDROCHLOROTHIAZIDE 10-12.5 MG PO TABS: 1.0000 | ORAL_TABLET | Freq: Every day | ORAL | Status: DC

## 2017-12-20 MED ORDER — ROCURONIUM BROMIDE 10 MG/ML (PF) SYRINGE
PREFILLED_SYRINGE | INTRAVENOUS | Status: AC
  Filled 2017-12-20: qty 20

## 2017-12-20 MED ORDER — GADOBENATE DIMEGLUMINE 529 MG/ML IV SOLN
20.0000 mL | Freq: Once | INTRAVENOUS | Status: AC | PRN
  Administered 2017-12-20: 20 mL via INTRAVENOUS

## 2017-12-20 MED ORDER — SENNA 8.6 MG PO TABS
1.0000 | ORAL_TABLET | Freq: Two times a day (BID) | ORAL | Status: DC
  Administered 2017-12-20 – 2017-12-26 (×13): 8.6 mg via ORAL
  Filled 2017-12-20 (×13): qty 1

## 2017-12-20 MED ORDER — PHENYLEPHRINE 40 MCG/ML (10ML) SYRINGE FOR IV PUSH (FOR BLOOD PRESSURE SUPPORT)
PREFILLED_SYRINGE | INTRAVENOUS | Status: AC
  Filled 2017-12-20: qty 10

## 2017-12-20 MED ORDER — ALUM & MAG HYDROXIDE-SIMETH 200-200-20 MG/5ML PO SUSP: 30.0000 mL | Freq: Four times a day (QID) | ORAL | Status: DC | PRN

## 2017-12-20 MED ORDER — ACETAMINOPHEN 325 MG PO TABS: 650.0000 mg | ORAL_TABLET | ORAL | Status: DC | PRN

## 2017-12-20 MED ORDER — THROMBIN 5000 UNITS EX SOLR
CUTANEOUS | Status: DC | PRN
  Administered 2017-12-20 (×2): 5000 [IU] via TOPICAL

## 2017-12-20 MED ORDER — POTASSIUM CHLORIDE IN NACL 20-0.9 MEQ/L-% IV SOLN
INTRAVENOUS | Status: DC
  Administered 2017-12-21 – 2017-12-25 (×8): via INTRAVENOUS
  Filled 2017-12-20 (×9): qty 1000

## 2017-12-20 MED ORDER — FLEET ENEMA 7-19 GM/118ML RE ENEM: 1.0000 | ENEMA | Freq: Once | RECTAL | Status: DC | PRN

## 2017-12-20 MED ORDER — SODIUM CHLORIDE 0.9 % IV SOLN: 250.0000 mL | INTRAVENOUS | Status: DC

## 2017-12-20 MED ORDER — LIDOCAINE 2% (20 MG/ML) 5 ML SYRINGE
INTRAMUSCULAR | Status: DC | PRN
  Administered 2017-12-20: 100 mg via INTRAVENOUS

## 2017-12-20 MED ORDER — ONDANSETRON HCL 4 MG/2ML IJ SOLN
INTRAMUSCULAR | Status: AC
  Filled 2017-12-20: qty 2

## 2017-12-20 MED ORDER — ZOLPIDEM TARTRATE 5 MG PO TABS
5.0000 mg | ORAL_TABLET | Freq: Every evening | ORAL | Status: DC | PRN
  Administered 2017-12-21 – 2018-01-15 (×15): 5 mg via ORAL
  Filled 2017-12-20 (×17): qty 1

## 2017-12-20 MED ORDER — CLONAZEPAM 1 MG PO TABS
2.0000 mg | ORAL_TABLET | Freq: Two times a day (BID) | ORAL | Status: DC
  Administered 2017-12-20 – 2018-01-16 (×52): 2 mg via ORAL
  Filled 2017-12-20 (×52): qty 2

## 2017-12-20 MED ORDER — PHENOL 1.4 % MT LIQD: 1.0000 | OROMUCOSAL | Status: DC | PRN

## 2017-12-20 MED ORDER — ACETAMINOPHEN 325 MG PO TABS
650.0000 mg | ORAL_TABLET | ORAL | Status: DC | PRN
  Administered 2017-12-21 – 2017-12-25 (×2): 650 mg via ORAL
  Filled 2017-12-20 (×2): qty 2

## 2017-12-20 MED ORDER — SODIUM CHLORIDE 0.9% FLUSH: 3.0000 mL | Freq: Two times a day (BID) | INTRAVENOUS | Status: DC

## 2017-12-20 MED ORDER — HYDROCODONE-ACETAMINOPHEN 5-325 MG PO TABS
2.0000 | ORAL_TABLET | ORAL | Status: DC | PRN
  Administered 2017-12-20 – 2017-12-21 (×2): 2 via ORAL
  Filled 2017-12-20 (×3): qty 2

## 2017-12-20 MED ORDER — DEXAMETHASONE SODIUM PHOSPHATE 10 MG/ML IJ SOLN
INTRAMUSCULAR | Status: AC
  Filled 2017-12-20: qty 1

## 2017-12-20 MED ORDER — SODIUM CHLORIDE 0.9 % IV SOLN
INTRAVENOUS | Status: DC | PRN
  Administered 2017-12-20: 17:00:00

## 2017-12-20 MED ORDER — METHOCARBAMOL 1000 MG/10ML IJ SOLN: 500.0000 mg | Freq: Four times a day (QID) | INTRAVENOUS | Status: DC | PRN

## 2017-12-20 MED ORDER — PROMETHAZINE HCL 25 MG/ML IJ SOLN: 6.2500 mg | INTRAMUSCULAR | Status: DC | PRN

## 2017-12-20 MED ORDER — MORPHINE SULFATE (PF) 2 MG/ML IV SOLN
2.0000 mg | INTRAVENOUS | Status: DC | PRN
  Administered 2017-12-21 – 2017-12-27 (×8): 2 mg via INTRAVENOUS
  Filled 2017-12-20 (×9): qty 1

## 2017-12-20 MED ORDER — CALCIUM CARBONATE ANTACID 500 MG PO CHEW
3.0000 | CHEWABLE_TABLET | ORAL | Status: DC | PRN
  Administered 2018-01-08: 600 mg via ORAL
  Filled 2017-12-20 (×2): qty 3

## 2017-12-20 MED ORDER — SCOPOLAMINE 1 MG/3DAYS TD PT72
MEDICATED_PATCH | TRANSDERMAL | Status: AC
  Filled 2017-12-20: qty 2

## 2017-12-20 MED ORDER — METHOCARBAMOL 500 MG PO TABS
500.0000 mg | ORAL_TABLET | Freq: Four times a day (QID) | ORAL | Status: DC | PRN
Start: 2017-12-20 — End: 2017-12-27
  Administered 2017-12-21 – 2017-12-27 (×8): 500 mg via ORAL
  Filled 2017-12-20 (×8): qty 1

## 2017-12-20 MED ORDER — OXYCODONE HCL 5 MG PO TABS: 5.0000 mg | ORAL_TABLET | Freq: Four times a day (QID) | ORAL | Status: DC | PRN

## 2017-12-20 MED ORDER — MELATONIN 3 MG PO TABS
3.0000 mg | ORAL_TABLET | Freq: Every evening | ORAL | Status: DC | PRN
  Administered 2017-12-26 – 2018-01-03 (×4): 3 mg via ORAL
  Filled 2017-12-20 (×8): qty 1

## 2017-12-20 MED ORDER — LEVOTHYROXINE SODIUM 50 MCG PO TABS
50.0000 ug | ORAL_TABLET | Freq: Every day | ORAL | Status: DC
  Administered 2017-12-20 – 2018-01-15 (×27): 50 ug via ORAL
  Filled 2017-12-20 (×27): qty 1

## 2017-12-20 MED ORDER — PROMETHAZINE HCL 25 MG PO TABS: 12.5000 mg | ORAL_TABLET | Freq: Four times a day (QID) | ORAL | Status: DC | PRN

## 2017-12-20 MED ORDER — HYDROMORPHONE HCL 1 MG/ML IJ SOLN
INTRAMUSCULAR | Status: AC
  Filled 2017-12-20: qty 1

## 2017-12-20 MED ORDER — POLYETHYLENE GLYCOL 3350 17 G PO PACK
17.0000 g | PACK | Freq: Every day | ORAL | Status: DC | PRN
  Administered 2017-12-24 – 2018-01-15 (×2): 17 g via ORAL
  Filled 2017-12-20 (×3): qty 1

## 2017-12-20 MED ORDER — CEFAZOLIN SODIUM 1 G IJ SOLR
INTRAMUSCULAR | Status: AC
  Filled 2017-12-20: qty 30

## 2017-12-20 MED ORDER — 0.9 % SODIUM CHLORIDE (POUR BTL) OPTIME
TOPICAL | Status: DC | PRN
  Administered 2017-12-20: 1000 mL

## 2017-12-20 MED ORDER — SODIUM CHLORIDE 0.9% FLUSH
3.0000 mL | Freq: Two times a day (BID) | INTRAVENOUS | Status: DC
  Administered 2017-12-22 – 2017-12-25 (×7): 3 mL via INTRAVENOUS

## 2017-12-20 MED ORDER — ROCURONIUM BROMIDE 100 MG/10ML IV SOLN
INTRAVENOUS | Status: DC | PRN
  Administered 2017-12-20: 70 mg via INTRAVENOUS

## 2017-12-20 MED ORDER — METHOCARBAMOL 500 MG PO TABS: 500.0000 mg | ORAL_TABLET | Freq: Four times a day (QID) | ORAL | Status: DC | PRN

## 2017-12-20 MED ORDER — LACTATED RINGERS IV SOLN
INTRAVENOUS | Status: DC
  Administered 2017-12-20: 16:00:00 via INTRAVENOUS
  Administered 2017-12-20: 50 mL/h via INTRAVENOUS
  Administered 2017-12-20: 17:00:00 via INTRAVENOUS

## 2017-12-20 MED ORDER — CYCLOBENZAPRINE HCL 10 MG PO TABS
10.0000 mg | ORAL_TABLET | Freq: Three times a day (TID) | ORAL | Status: DC | PRN
Start: 2017-12-20 — End: 2018-01-16
  Administered 2017-12-21 – 2018-01-15 (×39): 10 mg via ORAL
  Filled 2017-12-20 (×40): qty 1

## 2017-12-20 MED ORDER — METHOCARBAMOL 1000 MG/10ML IJ SOLN
500.0000 mg | Freq: Four times a day (QID) | INTRAVENOUS | Status: DC | PRN
  Filled 2017-12-20: qty 5

## 2017-12-20 MED ORDER — METOCLOPRAMIDE HCL 5 MG/ML IJ SOLN
10.0000 mg | Freq: Once | INTRAMUSCULAR | Status: AC
  Administered 2017-12-20: 10 mg via INTRAVENOUS
  Filled 2017-12-20: qty 2

## 2017-12-20 MED ORDER — MAGNESIUM CITRATE PO SOLN: 1.0000 | Freq: Once | ORAL | Status: DC

## 2017-12-20 MED ORDER — MIDAZOLAM HCL 2 MG/2ML IJ SOLN
INTRAMUSCULAR | Status: AC
  Filled 2017-12-20: qty 2

## 2017-12-20 MED ORDER — VENLAFAXINE HCL ER 75 MG PO CP24: 75.0000 mg | ORAL_CAPSULE | Freq: Every day | ORAL | Status: DC

## 2017-12-20 MED ORDER — METOCLOPRAMIDE HCL 10 MG PO TABS: 10.0000 mg | ORAL_TABLET | Freq: Four times a day (QID) | ORAL | Status: DC | PRN

## 2017-12-20 MED ORDER — OXYCODONE HCL 5 MG PO TABS
10.0000 mg | ORAL_TABLET | ORAL | Status: DC | PRN
  Administered 2017-12-21 – 2017-12-26 (×10): 10 mg via ORAL
  Filled 2017-12-20 (×12): qty 2

## 2017-12-20 MED ORDER — MENTHOL 3 MG MT LOZG: 1.0000 | LOZENGE | OROMUCOSAL | Status: DC | PRN

## 2017-12-20 MED ORDER — DOCUSATE SODIUM 100 MG PO CAPS
100.0000 mg | ORAL_CAPSULE | Freq: Two times a day (BID) | ORAL | Status: DC
  Administered 2017-12-20 – 2018-01-16 (×44): 100 mg via ORAL
  Filled 2017-12-20 (×48): qty 1

## 2017-12-20 MED ORDER — DIPHENHYDRAMINE HCL 25 MG PO CAPS
50.0000 mg | ORAL_CAPSULE | Freq: Four times a day (QID) | ORAL | Status: DC | PRN
  Administered 2017-12-21 – 2018-01-12 (×9): 50 mg via ORAL
  Filled 2017-12-20 (×13): qty 2

## 2017-12-20 MED ORDER — ALBUTEROL SULFATE (2.5 MG/3ML) 0.083% IN NEBU: 2.5000 mg | INHALATION_SOLUTION | Freq: Four times a day (QID) | RESPIRATORY_TRACT | Status: DC | PRN

## 2017-12-20 MED ORDER — KCL IN DEXTROSE-NACL 20-5-0.45 MEQ/L-%-% IV SOLN: INTRAVENOUS | Status: DC

## 2017-12-20 MED ORDER — TRAZODONE HCL 50 MG PO TABS
150.0000 mg | ORAL_TABLET | Freq: Every evening | ORAL | Status: DC | PRN
  Administered 2017-12-28 – 2018-01-08 (×6): 150 mg via ORAL
  Filled 2017-12-20 (×7): qty 1
  Filled 2017-12-20: qty 3
  Filled 2017-12-20 (×2): qty 1

## 2017-12-20 MED ORDER — ALBUTEROL SULFATE HFA 108 (90 BASE) MCG/ACT IN AERS: 2.0000 | INHALATION_SPRAY | Freq: Four times a day (QID) | RESPIRATORY_TRACT | Status: DC | PRN

## 2017-12-20 MED ORDER — ONDANSETRON HCL 4 MG/2ML IJ SOLN
4.0000 mg | Freq: Four times a day (QID) | INTRAMUSCULAR | Status: DC | PRN
  Administered 2017-12-22 – 2017-12-27 (×8): 4 mg via INTRAVENOUS
  Filled 2017-12-20 (×8): qty 2

## 2017-12-20 MED ORDER — BUPIVACAINE HCL (PF) 0.25 % IJ SOLN
INTRAMUSCULAR | Status: AC
  Filled 2017-12-20: qty 30

## 2017-12-20 MED ORDER — OXYCODONE HCL 5 MG PO TABS
5.0000 mg | ORAL_TABLET | ORAL | Status: DC | PRN
  Administered 2017-12-24 – 2017-12-25 (×3): 5 mg via ORAL
  Filled 2017-12-20 (×2): qty 1

## 2017-12-20 MED ORDER — SUGAMMADEX SODIUM 200 MG/2ML IV SOLN
INTRAVENOUS | Status: DC | PRN
  Administered 2017-12-20: 300 mg via INTRAVENOUS

## 2017-12-20 MED ORDER — ESMOLOL HCL 100 MG/10ML IV SOLN
INTRAVENOUS | Status: AC
  Filled 2017-12-20: qty 10

## 2017-12-20 MED ORDER — FAMOTIDINE IN NACL 20-0.9 MG/50ML-% IV SOLN
20.0000 mg | Freq: Two times a day (BID) | INTRAVENOUS | Status: DC
  Administered 2017-12-20: 20 mg via INTRAVENOUS
  Filled 2017-12-20: qty 50

## 2017-12-20 MED ORDER — PROPOFOL 10 MG/ML IV BOLUS
INTRAVENOUS | Status: DC | PRN
  Administered 2017-12-20: 300 mg via INTRAVENOUS
  Administered 2017-12-20 (×2): 50 mg via INTRAVENOUS

## 2017-12-20 MED ORDER — CEFAZOLIN SODIUM-DEXTROSE 2-4 GM/100ML-% IV SOLN
2.0000 g | Freq: Three times a day (TID) | INTRAVENOUS | Status: AC
  Administered 2017-12-20 – 2017-12-21 (×2): 2 g via INTRAVENOUS
  Filled 2017-12-20 (×3): qty 100

## 2017-12-20 MED ORDER — ONDANSETRON HCL 4 MG/2ML IJ SOLN: 4.0000 mg | Freq: Four times a day (QID) | INTRAMUSCULAR | Status: DC | PRN

## 2017-12-20 MED ORDER — BISACODYL 10 MG RE SUPP
10.0000 mg | Freq: Every day | RECTAL | Status: DC | PRN
Start: 2017-12-20 — End: 2018-01-16
  Administered 2017-12-24 – 2017-12-26 (×2): 10 mg via RECTAL
  Filled 2017-12-20 (×2): qty 1

## 2017-12-20 MED ORDER — MIDAZOLAM HCL 2 MG/2ML IJ SOLN
INTRAMUSCULAR | Status: DC | PRN
  Administered 2017-12-20: 2 mg via INTRAVENOUS

## 2017-12-20 MED ORDER — SODIUM CHLORIDE 0.9% FLUSH: 3.0000 mL | INTRAVENOUS | Status: DC | PRN

## 2017-12-20 MED ORDER — SUGAMMADEX SODIUM 200 MG/2ML IV SOLN
INTRAVENOUS | Status: AC
  Filled 2017-12-20: qty 2

## 2017-12-20 MED ORDER — GABAPENTIN 300 MG PO CAPS
600.0000 mg | ORAL_CAPSULE | Freq: Three times a day (TID) | ORAL | Status: DC
  Administered 2017-12-20 – 2018-01-16 (×76): 600 mg via ORAL
  Filled 2017-12-20 (×76): qty 2

## 2017-12-20 MED ORDER — DEXAMETHASONE SODIUM PHOSPHATE 10 MG/ML IJ SOLN
INTRAMUSCULAR | Status: DC | PRN
  Administered 2017-12-20: 10 mg via INTRAVENOUS

## 2017-12-20 SURGICAL SUPPLY — 46 items
APL SKNCLS STERI-STRIP NONHPOA (GAUZE/BANDAGES/DRESSINGS) ×1
BAG DECANTER FOR FLEXI CONT (MISCELLANEOUS) ×3 IMPLANT
BENZOIN TINCTURE PRP APPL 2/3 (GAUZE/BANDAGES/DRESSINGS) ×3 IMPLANT
BUR MATCHSTICK NEURO 3.0 LAGG (BURR) ×3 IMPLANT
CANISTER SUCT 3000ML PPV (MISCELLANEOUS) ×3 IMPLANT
CARTRIDGE OIL MAESTRO DRILL (MISCELLANEOUS) ×1 IMPLANT
CLOSURE WOUND 1/2 X4 (GAUZE/BANDAGES/DRESSINGS) ×1
DIFFUSER DRILL AIR PNEUMATIC (MISCELLANEOUS) ×3 IMPLANT
DRAPE LAPAROTOMY 100X72X124 (DRAPES) ×3 IMPLANT
DRAPE MICROSCOPE LEICA (MISCELLANEOUS) ×3 IMPLANT
DRAPE POUCH INSTRU U-SHP 10X18 (DRAPES) ×3 IMPLANT
DRAPE SURG 17X23 STRL (DRAPES) ×3 IMPLANT
DRSG OPSITE POSTOP 4X6 (GAUZE/BANDAGES/DRESSINGS) ×2 IMPLANT
DURAPREP 26ML APPLICATOR (WOUND CARE) ×3 IMPLANT
ELECT REM PT RETURN 9FT ADLT (ELECTROSURGICAL) ×3
ELECTRODE REM PT RTRN 9FT ADLT (ELECTROSURGICAL) ×1 IMPLANT
FLOSEAL 5ML (HEMOSTASIS) ×2 IMPLANT
GAUZE SPONGE 4X4 16PLY XRAY LF (GAUZE/BANDAGES/DRESSINGS) IMPLANT
GLOVE BIO SURGEON STRL SZ8 (GLOVE) ×3 IMPLANT
GOWN STRL REUS W/ TWL LRG LVL3 (GOWN DISPOSABLE) IMPLANT
GOWN STRL REUS W/ TWL XL LVL3 (GOWN DISPOSABLE) ×1 IMPLANT
GOWN STRL REUS W/TWL 2XL LVL3 (GOWN DISPOSABLE) IMPLANT
GOWN STRL REUS W/TWL LRG LVL3 (GOWN DISPOSABLE) ×3
GOWN STRL REUS W/TWL XL LVL3 (GOWN DISPOSABLE) ×6
KIT BASIN OR (CUSTOM PROCEDURE TRAY) ×3 IMPLANT
KIT TURNOVER KIT B (KITS) ×3 IMPLANT
NDL SPNL 20GX3.5 QUINCKE YW (NEEDLE) IMPLANT
NEEDLE HYPO 25X1 1.5 SAFETY (NEEDLE) ×3 IMPLANT
NEEDLE SPNL 20GX3.5 QUINCKE YW (NEEDLE) ×3 IMPLANT
NS IRRIG 1000ML POUR BTL (IV SOLUTION) ×3 IMPLANT
OIL CARTRIDGE MAESTRO DRILL (MISCELLANEOUS) ×3
PACK LAMINECTOMY NEURO (CUSTOM PROCEDURE TRAY) ×3 IMPLANT
PAD ARMBOARD 7.5X6 YLW CONV (MISCELLANEOUS) ×9 IMPLANT
RUBBERBAND STERILE (MISCELLANEOUS) ×6 IMPLANT
SEALANT ADHERUS EXTEND TIP (MISCELLANEOUS) ×2 IMPLANT
SPONGE SURGIFOAM ABS GEL SZ50 (HEMOSTASIS) ×2 IMPLANT
STRIP CLOSURE SKIN 1/2X4 (GAUZE/BANDAGES/DRESSINGS) ×2 IMPLANT
SUT NURALON 4 0 TR CR/8 (SUTURE) ×2 IMPLANT
SUT PROLENE 6 0 BV (SUTURE) ×2 IMPLANT
SUT VIC AB 0 CT1 18XCR BRD8 (SUTURE) ×1 IMPLANT
SUT VIC AB 0 CT1 8-18 (SUTURE) ×3
SUT VIC AB 2-0 CP2 18 (SUTURE) ×3 IMPLANT
SUT VIC AB 3-0 SH 8-18 (SUTURE) ×5 IMPLANT
TOWEL GREEN STERILE (TOWEL DISPOSABLE) ×3 IMPLANT
TOWEL GREEN STERILE FF (TOWEL DISPOSABLE) ×3 IMPLANT
WATER STERILE IRR 1000ML POUR (IV SOLUTION) ×3 IMPLANT

## 2017-12-20 NOTE — Anesthesia Procedure Notes (Addendum)
Procedure Name: Intubation Date/Time: 12/20/2017 3:44 PM Performed by: Jodell Ciproato, Autymn Omlor A, CRNA Pre-anesthesia Checklist: Patient identified, Emergency Drugs available, Suction available and Patient being monitored Patient Re-evaluated:Patient Re-evaluated prior to induction Oxygen Delivery Method: Circle System Utilized Preoxygenation: Pre-oxygenation with 100% oxygen Induction Type: IV induction, Cricoid Pressure applied and Rapid sequence Laryngoscope Size: 4 and Glidescope Grade View: Grade I Tube type: Oral Tube size: 7.0 mm Number of attempts: 1 Airway Equipment and Method: Stylet Placement Confirmation: ETT inserted through vocal cords under direct vision,  positive ETCO2 and breath sounds checked- equal and bilateral Secured at: 22 cm Tube secured with: Tape Dental Injury: Teeth and Oropharynx as per pre-operative assessment

## 2017-12-20 NOTE — Progress Notes (Signed)
Subjective: Patient remains quite tearful and anxious. She complains of headache and back pain. Dressing looks dry. MRI reviewed.  Objective: Vital signs in last 24 hours: Temp:  [97.6 F (36.4 C)-98.6 F (37 C)] 98.6 F (37 C) (07/03 0734) Pulse Rate:  [87-101] 87 (07/03 0734) Resp:  [18] 18 (07/03 0734) BP: (122-140)/(70-95) 122/70 (07/03 0734) SpO2:  [96 %-97 %] 96 % (07/03 0734) Weight:  [147.9 kg (326 lb)] 147.9 kg (326 lb) (07/03 0122)  Intake/Output from previous day: No intake/output data recorded. Intake/Output this shift: No intake/output data recorded.  Neurologic: Grossly normal  Lab Results: Lab Results  Component Value Date   WBC 8.4 12/20/2017   HGB 11.8 (L) 12/20/2017   HCT 36.1 12/20/2017   MCV 90.5 12/20/2017   PLT 247 12/20/2017   No results found for: INR, PROTIME BMET Lab Results  Component Value Date   NA 138 12/20/2017   K 4.0 12/20/2017   CL 103 12/20/2017   CO2 25 12/20/2017   GLUCOSE 108 (H) 12/20/2017   BUN 16 12/20/2017   CREATININE 0.89 12/20/2017   CALCIUM 9.2 12/20/2017    Studies/Results: Mr Lumbar Spine W Wo Contrast  Result Date: 12/20/2017 CLINICAL DATA:  32 y/o F; severe positional headache. Leaking clear fluid from lumbar wound. Lumbar microdiscectomy on 12/05/2017. EXAM: MRI LUMBAR SPINE WITHOUT AND WITH CONTRAST TECHNIQUE: Multiplanar and multiecho pulse sequences of the lumbar spine were obtained without and with intravenous contrast. CONTRAST:  20mL MULTIHANCE GADOBENATE DIMEGLUMINE 529 MG/ML IV SOLN COMPARISON:  12/08/2017 CT lumbar spine. 11/24/2017 lumbar spine MRI. FINDINGS: Segmentation:  Standard. Alignment:  Physiologic. Vertebrae: No fracture, evidence of discitis, or bone lesion. No abnormal enhancement. Conus medullaris and cauda equina: Conus extends to the T12-L1 level. Fatty filum. No posterior dysraphism. Paraspinal and other soft tissues: Fluid collection within the lumbar subcutaneous fat measuring 4.8 x 3.9 x 2.7  cm (AP x ML x CC series 14, image 7 and series 6, image 19) compatible postoperative seroma. Fluid extends along the right aspect of the L4 spinous process to the right L4-5 hemilaminectomy bed. There are small nonenhancing fluid collections within the posterior extraarachnoid space posterior to the thecal sac extending from the mid L4 level to the S1 level (series 12, image 22). Additionally, there is thin longitudinal and concentric extraarachnoid fluid accumulation throughout the visible spinal canal (series 9, image 7). Disc levels: L1-2: No significant disc displacement, foraminal stenosis, or canal stenosis. L2-3: No significant disc displacement, foraminal stenosis, or canal stenosis. L3-4: Stable small left foraminal disc protrusion with mild left foraminal stenosis. No significant canal stenosis. L4-5: Interval partial discectomy with resection of the extruded disc fragment and small residual might central protrusion. Epidural fluid collections efface the thecal sac with crowding of the cauda equina. Mild right foraminal stenosis. No significant canal stenosis. L5-S1: Stable central disc protrusion. No significant foraminal or canal stenosis. Epidural fluid accumulation effaces the thecal sac with crowding of the cauda equina. IMPRESSION: 1. Postsurgical changes related to a right L4-5 hemilaminectomy. 2. Extraarachnoid fluid collections posteriorly from the mid L4 to S1 level probably representing a CSF leak. Additionally, there is thin longitudinal and concentric extraarachnoid fluid accumulation throughout the visible spinal canal compatible with intracranial hypotension. No enhancement of fluid collections to suggest inflammation or infection. Electronically Signed   By: Mitzi HansenLance  Furusawa-Stratton M.D.   On: 12/20/2017 07:14    Assessment/Plan: I have recommended lumbar reexploration with hopeful repair of pseudomeningocele. She understands the risks include but aren't limited  to bleeding, infection,  nerve injury, numbness, weakness, loss of bowel bladder or sexual function, eat for lumbar drain, need for further surgery, lack of relief of symptoms, worsening symptoms, inability to repair the leak, and anesthesia risk including DVT pneumonia MI and death. She agrees to proceed  Estimated body mass index is 59.63 kg/m as calculated from the following:   Height as of this encounter: 5\' 2"  (1.575 m).   Weight as of this encounter: 147.9 kg (326 lb).    LOS: 0 days    JONES,DAVID S 12/20/2017, 12:02 PM

## 2017-12-20 NOTE — Transfer of Care (Signed)
Immediate Anesthesia Transfer of Care Note  Patient: Sonya Gibson  Procedure(s) Performed: Lumbar Laminectomy, Repair of CSF Leak (N/A Back)  Patient Location: PACU  Anesthesia Type:General  Level of Consciousness: awake, alert , oriented and patient cooperative  Airway & Oxygen Therapy: Patient Spontanous Breathing and Patient connected to nasal cannula oxygen  Post-op Assessment: Report given to RN, Post -op Vital signs reviewed and stable and Patient moving all extremities  Post vital signs: Reviewed and stable  Last Vitals:  Vitals Value Taken Time  BP 144/81 12/20/2017  5:40 PM  Temp    Pulse 109 12/20/2017  5:42 PM  Resp 13 12/20/2017  5:42 PM  SpO2 96 % 12/20/2017  5:42 PM  Vitals shown include unvalidated device data.  Last Pain:  Vitals:   12/20/17 1235  TempSrc: Oral  PainSc:       Patients Stated Pain Goal: 5 (12/20/17 1140)  Complications: No apparent anesthesia complications

## 2017-12-20 NOTE — ED Provider Notes (Signed)
[facility] EMERGENCY DEPARTMENT Provider Note   CSN: [id number] Arrival date & time: 12/20/17  0117     History   Chief Complaint Chief Complaint  Patient presents with  . Headache    HPI Sonya Gibson is a 32 y.o. female.  The history is provided by the patient.  Headache   This is a new problem. The current episode started more than 2 days ago. The problem occurs constantly. The problem has been gradually worsening. Associated with: recent surgery. The pain is severe. Associated symptoms include vomiting. Pertinent negatives include no fever.  Recent lumbar discectomy, presents with headache.  She reports the past several days she has been having severe headaches with nausea vomiting.  Denies fever.  She reports she is now having fluid leaking from her lumbar discectomy wound.  She was instructed to come to the emergency department by her surgeon  Past Medical History:  Diagnosis Date  . Anxiety   . Chronic back pain   . Depression   . Drug-seeking behavior   . Essential hypertension   . GERD (gastroesophageal reflux disease)   . Hypothyroidism   . Lumbar radiculopathy, right   . Migraine headache   . PTSD (post-traumatic stress disorder)     Patient Active Problem List   Diagnosis Date Noted  . CSF leak 12/20/2017  . Lumbar radiculopathy   . S/P lumbar laminectomy 12/05/2017  . Perineal numbness 11/30/2017  . Weakness of right lower extremity 11/17/2017  . Lower back pain 11/15/2017  . Hypothyroidism   . Anxiety   . Essential hypertension   . PTSD (post-traumatic stress disorder)   . Decreased sensation of leg   . GESTATIONAL DIABETES 04/26/2010    Past Surgical History:  Procedure Laterality Date  . ANTERIOR CRUCIATE LIGAMENT REPAIR Left   . BACK SURGERY  12/05/2017  . CESAREAN SECTION    . IR EPIDUROGRAPHY  11/17/2017  . LUMBAR LAMINECTOMY/DECOMPRESSION MICRODISCECTOMY Right 12/05/2017   Procedure: Right L4-5 Microdiskectomy;   Surgeon: Tia Alert, MD;  Location: [facility] OR;  Service: Neurosurgery;  Laterality: Right;  . middle finger reatachment       OB History   None      Home Medications    Prior to Admission medications   Medication Sig Start Date End Date Taking? Authorizing Provider  albuterol (VENTOLIN HFA) 108 (90 Base) MCG/ACT inhaler Inhale 2 puffs into the lungs every 6 (six) hours as needed for wheezing or shortness of breath.   Yes [provider]  calcium carbonate (TUMS EX) 750 MG chewable tablet Chew 2 tablets by mouth as needed for heartburn.   Yes [provider]  clonazePAM (KLONOPIN) 2 MG tablet Take 1 tablet (2 mg total) by mouth 2 (two) times daily for 15 doses. 12/06/17 12/20/17 Yes Osvaldo Shipper, MD  diphenhydrAMINE (BENADRYL) 25 mg capsule Take 50 mg by mouth every 6 (six) hours as needed for sleep.   Yes [provider]  gabapentin (NEURONTIN) 300 MG capsule Take 2 capsules (600 mg total) by mouth 3 (three) times daily. 11/21/17 12/21/17 Yes Amin, Loura Halt, MD  ibuprofen (ADVIL,MOTRIN) 200 MG tablet Take 800-1,000 mg by mouth every 6 (six) hours as needed (for pain or headaches).    Yes [provider]  levothyroxine (SYNTHROID, LEVOTHROID) 50 MCG tablet Take 50 mcg by mouth daily.  06/06/17  Yes [provider]  lisinopril-hydrochlorothiazide (PRINZIDE,ZESTORETIC) 10-12.5 MG tablet Take 1 tablet by mouth at bedtime.  10/27/17  Yes  [provider]  Melatonin 10 MG TABS Take 10 mg by mouth daily as needed (sleep).   Yes [provider]  metoCLOPramide (REGLAN) 10 MG tablet Take 1 tablet (10 mg total) by mouth every 6 (six) hours as needed for nausea (or headache). 12/16/17  Yes Samuel JesterMcManus, Kathleen, DO  oxyCODONE (OXY IR/ROXICODONE) 5 MG immediate release tablet Take 5 mg by mouth every 6 (six) hours as needed (for pain).  12/15/17  Yes [provider]  promethazine (PHENERGAN) 12.5 MG tablet Take 1 tablet (12.5 mg total) by  mouth every 6 (six) hours as needed for refractory nausea / vomiting. 11/21/17  Yes Amin, Loura HaltAnkit Chirag, MD  traZODone (DESYREL) 150 MG tablet Take 150 mg by mouth at bedtime as needed for sleep.   Yes [provider]  venlafaxine XR (EFFEXOR-XR) 150 MG 24 hr capsule Take 150 mg by mouth at bedtime.  06/06/17 12/20/17 Yes [provider]  venlafaxine XR (EFFEXOR-XR) 75 MG 24 hr capsule Take 75 mg by mouth at bedtime.  12/12/17  Yes [provider]  cyclobenzaprine (FLEXERIL) 10 MG tablet Take 1 tablet (10 mg total) by mouth 3 (three) times daily as needed for muscle spasms. 12/06/17   Osvaldo ShipperKrishnan, Gokul, MD  magnesium citrate SOLN Take 1 Bottle by mouth once.    [provider]  neomycin-bacitracin-polymyxin (NEOSPORIN) OINT Apply 1 application topically 2 (two) times daily. 12/06/17   Osvaldo ShipperKrishnan, Gokul, MD  ondansetron (ZOFRAN ODT) 8 MG disintegrating tablet Take 1 tablet (8 mg total) by mouth every 8 (eight) hours as needed for up to 20 doses for nausea or vomiting. 11/21/17   Dimple NanasAmin, Ankit Chirag, MD  oxyCODONE-acetaminophen (PERCOCET/ROXICET) 5-325 MG tablet 1 or 2 tabs PO q6h prn pain Patient not taking: Reported on 12/20/2017 12/16/17   Samuel JesterMcManus, Kathleen, DO    Family History Family History  Problem Relation Age of Onset  . Diabetes Mellitus II Mother   . Hypertension Mother   . Seizures Mother   . Hypertension Father   . Diabetes Mellitus II Father   . Hyperlipidemia Father   . Bipolar disorder Sister     Social History Social History   Tobacco Use  . Smoking status: Never Smoker  . Smokeless tobacco: Never Used  Substance Use Topics  . Alcohol use: Yes    Comment: rare  . Drug use: No     Allergies   Cinnamon; Coconut oil; and Nitrofurantoin   Review of Systems Review of Systems  Constitutional: Negative for fever.  Gastrointestinal: Positive for vomiting.  Neurological: Positive for headaches.  Psychiatric/Behavioral: The patient is  nervous/anxious.   All other systems reviewed and are negative.    Physical Exam Updated Vital Signs BP (!) 140/92 (BP Location: Right Arm)   Pulse (!) 101   Temp 97.6 F (36.4 C) (Oral)   Resp 18   Ht 1.575 m (5\' 2" )   Wt (!) 147.9 kg (326 lb)   LMP 12/05/2017   SpO2 96%   BMI 59.63 kg/m   Physical Exam CONSTITUTIONAL: Well developed/well nourished, anxious HEAD: Normocephalic/atraumatic EYES: EOMI/PERRL ENMT: Mucous membranes moist NECK: supple no meningeal signs CV: S1/S2 noted, no murmurs/rubs/gallops noted LUNGS: Lungs are clear to auscultation bilaterally, no apparent distress ABDOMEN: soft, nontender NEURO: Pt is awake/alert/appropriate, moves all extremitiesx4.  No facial droop.   EXTREMITIES:  full ROM SKIN: warm, color normal PSYCH: anxious  ED Treatments / Results  Labs (all labs ordered are listed, but only abnormal results are displayed) Labs  Reviewed  CBC WITH DIFFERENTIAL/PLATELET - Abnormal; Notable for the following components:      Result Value   Hemoglobin 11.8 (*)    All other components within normal limits  I-STAT BETA HCG BLOOD, ED ([facility], [facility], [facility] ONLY) - Abnormal; Notable for the following components:   I-stat hCG, quantitative 7.4 (*)    All other components within normal limits  BASIC METABOLIC PANEL    EKG None  Radiology No results found.  Procedures Procedures (including critical care time)  Medications Ordered in ED Medications  Melatonin TABS 3 mg (has no administration in time range)  traZODone (DESYREL) tablet 150 mg (has no administration in time range)  levothyroxine (SYNTHROID, LEVOTHROID) tablet 50 mcg (has no administration in time range)  venlafaxine XR (EFFEXOR-XR) 24 hr capsule 225 mg (has no administration in time range)  ondansetron (ZOFRAN-ODT) disintegrating tablet 8 mg (8 mg Oral Given 12/20/17 0254)  gabapentin (NEURONTIN) capsule 600 mg (has no administration in time range)  promethazine (PHENERGAN) tablet 12.5  mg (has no administration in time range)  calcium carbonate (TUMS - dosed in mg elemental calcium) chewable tablet 600 mg of elemental calcium (has no administration in time range)  ibuprofen (ADVIL,MOTRIN) tablet 800 mg (has no administration in time range)  clonazePAM (KLONOPIN) tablet 2 mg (has no administration in time range)  cyclobenzaprine (FLEXERIL) tablet 10 mg (has no administration in time range)  oxyCODONE (Oxy IR/ROXICODONE) immediate release tablet 5 mg (has no administration in time range)  metoCLOPramide (REGLAN) tablet 10 mg (has no administration in time range)  oxyCODONE-acetaminophen (PERCOCET/ROXICET) 5-325 MG per tablet 1-2 tablet (has no administration in time range)  lisinopril (PRINIVIL,ZESTRIL) tablet 10 mg (has no administration in time range)    And  hydrochlorothiazide (MICROZIDE) capsule 12.5 mg (has no administration in time range)  diphenhydrAMINE (BENADRYL) injection 25 mg (has no administration in time range)  metoCLOPramide (REGLAN) injection 10 mg (has no administration in time range)  LORazepam (ATIVAN) injection 1 mg (has no administration in time range)     Initial Impression / Assessment and Plan / ED Course  I have reviewed the triage vital signs and the nursing notes.  Pertinent labs results that were available during my care of the patient were reviewed by me and considered in my medical decision making (see chart for details).     3:32 AM Patient had a lumbar discectomy on June 18.  She reports over the past day she has noted draining fluid from the wound.  She is already been seen by neurosurgery and admitted to their service.  I will defer examination of the wound as it was just checked by  the surgeon Plan admit to neurosurgical service, will obtain MRI lumbar spine She is requesting medications for headache as well as medicines for her anxiety this has been ordered Final Clinical Impressions(s) / ED Diagnoses   Final diagnoses:    Post-operative pain    ED Discharge Orders    None       Zadie Rhine, MD 12/20/17 [phone]

## 2017-12-20 NOTE — ED Notes (Signed)
Dr. Venetia MaxonStern is at the patients bedside

## 2017-12-20 NOTE — ED Triage Notes (Signed)
Pt states she has HA on/off since 6/29. Pt is 2 weeks post "L4-L5 Microdiskectomy" Pt was recently seen at [facility] for same. Pt also followed up with Dr. Jordan LikesPool (Neurosurgery) today in regards to the HA. Pt states she "thinks she is leaking spinal fluid" out of her incision site.

## 2017-12-20 NOTE — H&P (Signed)
Reason for Consult:clear fluid from lumbar wound Referring Physician: Eligha BridegroomWickline  Sonya Gibson is an 32 y.o. female.  HPI: Patient called me tonight stating that she was having a severe positional headache (worse with sitting up or standing) and that she was leaking clear fluid from her lumbar wound.  She had previously undergone lumbar microdiscectomy on 12/05/2017 by Dr. Yetta BarreJones for a right foot drop.  Her right foot strength and numbness have improved considerably since the surgery.  She is wearing sunglasses and complains that bright light is bothering her eyes.  She also presented to the ER on 12/16/17, but was not leaking from her lumbar wound at that time.  Past Medical History:  Diagnosis Date  . Anxiety   . Chronic back pain   . Depression   . Drug-seeking behavior   . Essential hypertension   . GERD (gastroesophageal reflux disease)   . Hypothyroidism   . Lumbar radiculopathy, right   . Migraine headache   . PTSD (post-traumatic stress disorder)     Past Surgical History:  Procedure Laterality Date  . ANTERIOR CRUCIATE LIGAMENT REPAIR Left   . BACK SURGERY  12/05/2017  . CESAREAN SECTION    . IR EPIDUROGRAPHY  11/17/2017  . LUMBAR LAMINECTOMY/DECOMPRESSION MICRODISCECTOMY Right 12/05/2017   Procedure: Right L4-5 Microdiskectomy;  Surgeon: Tia AlertJones, David S, MD;  [facility];  Service: Neurosurgery;  Laterality: Right;  . middle finger reatachment      Family History  Problem Relation Age of Onset  . Diabetes Mellitus II Mother   . Hypertension Mother   . Seizures Mother   . Hypertension Father   . Diabetes Mellitus II Father   . Hyperlipidemia Father   . Bipolar disorder Sister     Social History:  reports that she has never smoked. She has never used smokeless tobacco. She reports that she drinks alcohol. She reports that she does not use drugs.  Allergies:  Allergies  Allergen Reactions  . Coconut Oil Anaphylaxis  . Nitrofurantoin Hives    Medications: I have  reviewed the patient's current medications.  No results found for this or any previous visit (from the past 48 hour(s)).  No results found.  Review of Systems - Negative except As above    Blood pressure (!) 140/92, pulse (!) 101, temperature 97.6 F (36.4 C), temperature source Oral, resp. rate 18, height 5\' 2"  (1.575 m), weight (!) 147.9 kg (326 lb), last menstrual period 12/05/2017, SpO2 96 %. Physical Exam  Constitutional: She is oriented to person, place, and time. She appears well-developed and well-nourished.  Obese  HENT:  Head: Normocephalic and atraumatic.  Eyes: Pupils are equal, round, and reactive to light. EOM are normal.  Neck:  Mild neck stiffness  Neurological: She is alert and oriented to person, place, and time. She has normal strength. No cranial nerve deficit or sensory deficit. GCS eye subscore is 4. GCS verbal subscore is 5. GCS motor subscore is 6.  Mild right DF weakness    Assessment/Plan: Patient presents with headache and draining lumbar wound.  We will obtain a lumbar MRI and plan to admit patient.  She may require wound exploration.  Dorian HeckleSTERN,[name], MD 12/20/2017, 2:22 AM

## 2017-12-20 NOTE — Anesthesia Preprocedure Evaluation (Signed)
Anesthesia Evaluation  Patient identified by MRN, date of birth, ID band Patient awake    Reviewed: Allergy & Precautions, NPO status , Patient's Chart, lab work & pertinent test results  History of Anesthesia Complications Negative for: history of anesthetic complications  Airway Mallampati: II  TM Distance: >3 FB Neck ROM: Full    Dental  (+) Dental Advisory Given   Pulmonary neg pulmonary ROS,    breath sounds clear to auscultation       Cardiovascular hypertension, Pt. on medications (-) angina Rhythm:Regular Rate:Normal     Neuro/Psych Anxiety Depression Back pain: narcotics    GI/Hepatic Neg liver ROS, GERD  Poorly Controlled,Nausea and vomiting today   Endo/Other  Hypothyroidism Morbid obesityGlu 178, receiving insulin since hospitalized  Renal/GU negative Renal ROS     Musculoskeletal   Abdominal (+) + obese,   Peds  Hematology   Anesthesia Other Findings   Reproductive/Obstetrics                             Lab Results  Component Value Date   WBC 8.4 12/20/2017   HGB 11.8 (L) 12/20/2017   HCT 36.1 12/20/2017   MCV 90.5 12/20/2017   PLT 247 12/20/2017   Lab Results  Component Value Date   CREATININE 0.89 12/20/2017   BUN 16 12/20/2017   NA 138 12/20/2017   K 4.0 12/20/2017   CL 103 12/20/2017   CO2 25 12/20/2017    Anesthesia Physical  Anesthesia Plan  ASA: III  Anesthesia Plan: General   Post-op Pain Management:    Induction: Intravenous  PONV Risk Score and Plan: 4 or greater and Scopolamine patch - Pre-op, Ondansetron and Dexamethasone  Airway Management Planned: Oral ETT  Additional Equipment:   Intra-op Plan:   Post-operative Plan: Extubation in OR  Informed Consent: I have reviewed the patients History and Physical, chart, labs and discussed the procedure including the risks, benefits and alternatives for the proposed anesthesia with the patient  or authorized representative who has indicated his/her understanding and acceptance.   Dental advisory given  Plan Discussed with: CRNA and Surgeon  Anesthesia Plan Comments: (Plan routine monitors, GETA)        Anesthesia Quick Evaluation

## 2017-12-20 NOTE — Op Note (Signed)
12/20/2017  5:32 PM  PATIENT:  Sonya Gibson  32 y.o. female  PRE-OPERATIVE DIAGNOSIS:  pseudomeningocele  POST-OPERATIVE DIAGNOSIS:  same  PROCEDURE:  Lumbar reexploration with repair of severe meningocele requiring laminectomy  SURGEON:  Marikay Alaravid Jrue Jarriel, MD  ASSISTANTS: none  ANESTHESIA:   General  EBL: 20 ml  Total I/O In: 1000 [I.V.:1000] Out: 20 [Blood:20]  BLOOD ADMINISTERED: none  DRAINS: none  SPECIMEN:  none  INDICATION FOR PROCEDURE: This patient presented with severe postural headache.  Imaging showed pseudomeningocele. The patient tried conservative measures without relief. Pain was debilitating. Recommended lumbar exploration with repair of pseudomeningocele. Patient understood the risks, benefits, and alternatives and potential outcomes and wished to proceed.  PROCEDURE DETAILS: The patient was taken to the operating room and after induction of adequate generalized endotracheal anesthesia, the patient was rolled into the prone position on the Wilson frame and all pressure points were padded. The lumbar region was cleaned and then prepped with DuraPrep and draped in the usual sterile fashion. 5 cc of local anesthesia was injected and then a dorsal midline incision was made and carried down to the lumbo sacral fascia. There was immediate release of CSF.The fascia was opened and the paraspinous musculature was taken down in a subperiosteal fashion to expose L4-5 on the right. I removed any soft tissue from the surface of the dura. It appeared the leak was coming from up under the superior lamina of L5 in the midline. Therefore I exposed the left side by taking down the muscle in a subperiosteal fashion. A self-tapping retractor was placed. I removed the spinous process of L4. He used the high-speed drill and the Kerrison punches to remove the superior part of lamina of L5 to expose a small midline durotomy. Is able to close this with 2 interrupted 6-0 Prolene sutures. I felt  like I had a good repair of the leak. I irrigated with saline solution containing bacitracin. Achieved hemostasis with bipolar cautery, lined the dura with to small pieces of muscle over the repair site followed by Tisseel fibrin glue and Gelfoam, and then closed the fascia with 0 Vicryl. I closed the subcutaneous tissues with layers of 1-0 Vicryl and2-0 Vicryl and the subcuticular tissues with 3-0 Vicryl. The skin was then closed with benzoin and Steri-Strips. The drapes were removed, a sterile dressing was applied. The patient was awakened from general anesthesia and transferred to the recovery room in stable condition. At the end of the procedure all sponge, needle and instrument counts were correct.     PLAN OF CARE: Admit to inpatient   PATIENT DISPOSITION:  PACU - hemodynamically stable.   Delay start of Pharmacological VTE agent (>24hrs) due to surgical blood loss or risk of bleeding:  yes

## 2017-12-21 MED ORDER — FLUCONAZOLE 150 MG PO TABS
150.0000 mg | ORAL_TABLET | Freq: Once | ORAL | Status: AC
  Administered 2017-12-21: 150 mg via ORAL
  Filled 2017-12-21: qty 1

## 2017-12-21 MED ORDER — FAMOTIDINE 20 MG PO TABS
20.0000 mg | ORAL_TABLET | Freq: Two times a day (BID) | ORAL | Status: DC
  Administered 2017-12-21 – 2018-01-16 (×51): 20 mg via ORAL
  Filled 2017-12-21 (×51): qty 1

## 2017-12-21 NOTE — Progress Notes (Signed)
Subjective: Patient reports no headache, back sore, no leg pain, stable tingling in toes  Objective: Vital signs in last 24 hours: Temp:  [97.5 F (36.4 C)-98.6 F (37 C)] 97.6 F (36.4 C) (07/04 0335) Pulse Rate:  [87-109] 92 (07/04 0335) Resp:  [8-20] 18 (07/04 0335) BP: (100-145)/(64-99) 100/64 (07/04 0335) SpO2:  [94 %-100 %] 97 % (07/04 0335)  Intake/Output from previous day: 07/03 0701 - 07/04 0700 In: 1200 [I.V.:1200] Out: 620 [Urine:600; Blood:20] Intake/Output this shift: Total I/O In: -  Out: 600 [Urine:600]  Neurologic: Grossly normal, dressing dry and flat  Lab Results: Lab Results  Component Value Date   WBC 8.4 12/20/2017   HGB 11.8 (L) 12/20/2017   HCT 36.1 12/20/2017   MCV 90.5 12/20/2017   PLT 247 12/20/2017   No results found for: INR, PROTIME BMET Lab Results  Component Value Date   NA 138 12/20/2017   K 4.0 12/20/2017   CL 103 12/20/2017   CO2 25 12/20/2017   GLUCOSE 108 (H) 12/20/2017   BUN 16 12/20/2017   CREATININE 0.89 12/20/2017   CALCIUM 9.2 12/20/2017    Studies/Results: Mr Lumbar Spine W Wo Contrast  Result Date: 12/20/2017 CLINICAL DATA:  32 y/o F; severe positional headache. Leaking clear fluid from lumbar wound. Lumbar microdiscectomy on 12/05/2017. EXAM: MRI LUMBAR SPINE WITHOUT AND WITH CONTRAST TECHNIQUE: Multiplanar and multiecho pulse sequences of the lumbar spine were obtained without and with intravenous contrast. CONTRAST:  20mL MULTIHANCE GADOBENATE DIMEGLUMINE 529 MG/ML IV SOLN COMPARISON:  12/08/2017 CT lumbar spine. 11/24/2017 lumbar spine MRI. FINDINGS: Segmentation:  Standard. Alignment:  Physiologic. Vertebrae: No fracture, evidence of discitis, or bone lesion. No abnormal enhancement. Conus medullaris and cauda equina: Conus extends to the T12-L1 level. Fatty filum. No posterior dysraphism. Paraspinal and other soft tissues: Fluid collection within the lumbar subcutaneous fat measuring 4.8 x 3.9 x 2.7 cm (AP x ML x CC  series 14, image 7 and series 6, image 19) compatible postoperative seroma. Fluid extends along the right aspect of the L4 spinous process to the right L4-5 hemilaminectomy bed. There are small nonenhancing fluid collections within the posterior extraarachnoid space posterior to the thecal sac extending from the mid L4 level to the S1 level (series 12, image 22). Additionally, there is thin longitudinal and concentric extraarachnoid fluid accumulation throughout the visible spinal canal (series 9, image 7). Disc levels: L1-2: No significant disc displacement, foraminal stenosis, or canal stenosis. L2-3: No significant disc displacement, foraminal stenosis, or canal stenosis. L3-4: Stable small left foraminal disc protrusion with mild left foraminal stenosis. No significant canal stenosis. L4-5: Interval partial discectomy with resection of the extruded disc fragment and small residual might central protrusion. Epidural fluid collections efface the thecal sac with crowding of the cauda equina. Mild right foraminal stenosis. No significant canal stenosis. L5-S1: Stable central disc protrusion. No significant foraminal or canal stenosis. Epidural fluid accumulation effaces the thecal sac with crowding of the cauda equina. IMPRESSION: 1. Postsurgical changes related to a right L4-5 hemilaminectomy. 2. Extraarachnoid fluid collections posteriorly from the mid L4 to S1 level probably representing a CSF leak. Additionally, there is thin longitudinal and concentric extraarachnoid fluid accumulation throughout the visible spinal canal compatible with intracranial hypotension. No enhancement of fluid collections to suggest inflammation or infection. Electronically Signed   By: Mitzi HansenLance  Furusawa-Stratton M.D.   On: 12/20/2017 07:14    Assessment/Plan: Foley catheter for urinary retention - no other signs or sxs of cauda equina  Flat bedrest for 2  more days  Pain control  Estimated body mass index is 59.63 kg/m as  calculated from the following:   Height as of this encounter: 5\' 2"  (1.575 m).   Weight as of this encounter: 147.9 kg (326 lb).    LOS: 1 day    Sonya Gibson S 12/21/2017, 6:39 AM

## 2017-12-21 NOTE — Progress Notes (Signed)
PHARMACIST - PHYSICIAN COMMUNICATION  CONCERNING: IV to Oral Route Change Policy  RECOMMENDATION: This patient is receiving Famotidine (Pepcid) by the intravenous route.  Based on criteria approved by the Pharmacy and Therapeutics Committee, the intravenous medication(s) is/are being converted to the equivalent oral dose form(s).   DESCRIPTION: These criteria include:  The patient is eating (either orally or via tube) and/or has been taking other orally administered medications for a least 24 hours  The patient has no evidence of active gastrointestinal bleeding or impaired GI absorption (gastrectomy, short bowel, patient on TNA or NPO).  If you have questions about this conversion, please contact the Pharmacy Department  []   [phone]( [phone] )  [facility] []   [phone]( [phone] )  [facility] [x]   [phone]( [phone] )  [facility] []   [phone]( [phone] )  [facility] []   [phone]( [phone] )  [facility]    Estella HuskMichelle Niajah Sipos, VermontPharm.D., BCPS, BCIDP Clinical Pharmacist Phone: [phone] Please check AMION for all [facility] numbers 12/21/2017, 8:46 AM

## 2017-12-22 ENCOUNTER — Other Ambulatory Visit: Payer: Self-pay | Admitting: Family Medicine

## 2017-12-22 ENCOUNTER — Encounter (HOSPITAL_COMMUNITY): Payer: Self-pay | Admitting: Neurological Surgery

## 2017-12-22 MED ORDER — OXYCODONE HCL ER 20 MG PO T12A
20.0000 mg | EXTENDED_RELEASE_TABLET | Freq: Two times a day (BID) | ORAL | Status: DC
  Administered 2017-12-22 – 2017-12-26 (×10): 20 mg via ORAL
  Filled 2017-12-22 (×10): qty 1

## 2017-12-22 MED ORDER — HYDROMORPHONE HCL 1 MG/ML IJ SOLN
1.0000 mg | INTRAMUSCULAR | Status: DC | PRN
  Administered 2017-12-22 – 2017-12-27 (×32): 1 mg via INTRAVENOUS
  Filled 2017-12-22 (×32): qty 1

## 2017-12-22 NOTE — Anesthesia Postprocedure Evaluation (Signed)
Anesthesia Post Note  Patient: Sonya Gibson  Procedure(s) Performed: Repair of CSF Leak (N/A Spine Lumbar)     Patient location during evaluation: PACU Anesthesia Type: General Level of consciousness: awake and alert Pain management: pain level controlled Vital Signs Assessment: post-procedure vital signs reviewed and stable Respiratory status: spontaneous breathing, nonlabored ventilation, respiratory function stable and patient connected to nasal cannula oxygen Cardiovascular status: blood pressure returned to baseline and stable Postop Assessment: no apparent nausea or vomiting Anesthetic complications: no    Last Vitals:  Vitals:   12/22/17 0419 12/22/17 0810  BP: 115/73 126/86  Pulse: 96 89  Resp: 18 20  Temp: 36.6 C 36.8 C  SpO2: 97% 95%    Last Pain:  Vitals:   12/22/17 0810  TempSrc: Oral  PainSc:                  Ryan P Ellender

## 2017-12-22 NOTE — Progress Notes (Signed)
Neurosurgery Progress Note  No issues overnight. Complains of poorly controlled back pain and bloody discharge from incision with movement (showed picture on phone of bloody discharge on bandage) Denies LE pains.  EXAM:  BP 126/86 (BP Location: Left Arm)   Pulse 89   Temp 98.2 F (36.8 C) (Oral)   Resp 20   Ht 5\' 2"  (1.575 m)   Wt (!) 147.9 kg (326 lb)   LMP 12/05/2017   SpO2 95%   BMI 59.63 kg/m   Awake, alert, oriented  Speech fluent, appropriate  Laying flat MAEW Incision: c/d/i, bo fluctuance, no active drainage Catheter remains in place.  PLAN Relatively stable. Pain poorly controlled. Will make adjustments - add OxyContin ER Bloody discharge on bandage on picture from phone. No active drainage currently. Will monitor closely.  Will remove bedrest tomorrow and monitor for HA and drainage.

## 2017-12-23 NOTE — Progress Notes (Signed)
Neurosurgery Progress Note  No issues overnight. Pain slightly improved with adjustments yesterday No LE pain, HA  EXAM:  BP 125/76 (BP Location: Left Arm)   Pulse 95   Temp 98.2 F (36.8 C) (Oral)   Resp (!) 32   Ht 5\' 2"  (1.575 m)   Wt (!) 147.9 kg (326 lb)   LMP 12/05/2017   SpO2 96%   BMI 59.63 kg/m   Awake, alert, oriented  Speech fluent, appropriate  MAEW with good strength Incision: c/d/i, no fluctuance, no active drainage  PLAN Stable this am. No drainage from incision.  Bedrest removed. Discussed with nursing protocol for activity Will monitor for HA and drainage

## 2017-12-23 NOTE — Clinical Social Work Note (Signed)
CSW acknowledges SNF consult. Please consult PT and OT for evaluations.  Charlynn CourtSarah Marguerette Sheller, CSW [phone]

## 2017-12-24 MED ORDER — PROMETHAZINE HCL 25 MG/ML IJ SOLN
12.5000 mg | INTRAMUSCULAR | Status: DC | PRN
  Administered 2017-12-25 – 2017-12-27 (×2): 12.5 mg via INTRAVENOUS
  Administered 2017-12-27 – 2017-12-28 (×2): 25 mg via INTRAVENOUS
  Administered 2017-12-28: 12.5 mg via INTRAVENOUS
  Administered 2017-12-29 – 2017-12-30 (×3): 25 mg via INTRAVENOUS
  Administered 2017-12-31: 12.5 mg via INTRAVENOUS
  Administered 2017-12-31: 25 mg via INTRAVENOUS
  Administered 2017-12-31: 12.5 mg via INTRAVENOUS
  Administered 2018-01-01 – 2018-01-02 (×3): 25 mg via INTRAVENOUS
  Filled 2017-12-24 (×16): qty 1

## 2017-12-24 NOTE — Plan of Care (Signed)
Patient stable, discussed POC with patient, agreeable w/plan. Dsg change tol well, remains C/D/I, denies question/concerns at this time.

## 2017-12-24 NOTE — Consult Note (Signed)
WOC Nurse wound consult note Reason for Consult: Patient seen by this writer three weeks ago for the same problem (intertriginous dermatitis in the pannicular region).  Wound is 80% better and now nearly healed.  Patient states she was compliant with antimicrobial textile use and that it healed her "sore". Wound type: Moisture associated skin damage, specifically intertriginous dermatitis at the pannicular skin fold Pressure Injury POA: NA Measurement: 2.8cm x 2.2cm x 0.1cm (previously full thickness). Has contracted and granulated in from original depth but is at high risk for re injury. Wound ZOX:WRUEbed:Pink, moist Drainage (amount, consistency, odor) small amount serous Periwound: with evidence of previous wound healing, e.g. scarring Dressing procedure/placement/frequency: I will continue the previously effective therapy as patient no longer has the supply to treat this area. Orders for twice daily wound care using InterDry Ag+ are placed.  WOC nursing team will not follow, but will remain available to this patient, the nursing and medical teams.  Please re-consult if needed. Thanks, Ladona MowLaurie Adaya Garmany, MSN, RN, GNP, Hans EdenCWOCN, CWON-AP, FAAN  Pager# [phone]

## 2017-12-24 NOTE — Progress Notes (Signed)
Neurosurgery Progress Note  Nausea overnight not relieved with zofran. Requesting new med When got upright, clear fluid drained from back. HA has returned and is as severe as it was prior to surgery.  EXAM:  BP 132/73 (BP Location: Left Arm)   Pulse 98   Temp 98 F (36.7 C) (Oral)   Resp 20   Ht 5\' 2"  (1.575 m)   Wt (!) 147.9 kg (326 lb)   LMP 12/05/2017   SpO2 95%   BMI 59.63 kg/m   Awake, alert, oriented  Speech fluent, appropriate  MAEW with good strength Bandage saturated with serosanguinous fluid. Upright pressure causes drainage.  PLAN Appears to have CSF draining from wound when upright. Rec being flat again. Will potentially need lumbar drain placed. Dr Yetta BarreJones to return tomorrow to make definitive plan.  Nausea: added pherghan Pain manageable with current regimen

## 2017-12-25 ENCOUNTER — Inpatient Hospital Stay: Payer: Self-pay

## 2017-12-25 ENCOUNTER — Inpatient Hospital Stay (HOSPITAL_COMMUNITY): Payer: BLUE CROSS/BLUE SHIELD

## 2017-12-25 LAB — BASIC METABOLIC PANEL
Anion gap: 9 (ref 5–15)
BUN: 8 mg/dL (ref 6–20)
CALCIUM: 8.1 mg/dL — AB (ref 8.9–10.3)
CHLORIDE: 101 mmol/L (ref 98–111)
CO2: 26 mmol/L (ref 22–32)
CREATININE: 0.91 mg/dL (ref 0.44–1.00)
GFR calc Af Amer: >60 mL/min (ref >60)
GFR calc non Af Amer: >60 mL/min (ref >60)
GLUCOSE: 138 mg/dL — AB (ref 70–99)
Potassium: 4.3 mmol/L (ref 3.5–5.1)
Sodium: 136 mmol/L (ref 135–145)

## 2017-12-25 LAB — CBC
HCT: 33.6 % — ABNORMAL LOW (ref 36.0–46.0)
HEMOGLOBIN: 10.8 g/dL — AB (ref 12.0–15.0)
MCH: 29.7 pg (ref 26.0–34.0)
MCHC: 32.1 g/dL (ref 30.0–36.0)
MCV: 92.3 fL (ref 78.0–100.0)
Platelets: 303 10*3/uL (ref 150–400)
RBC: 3.64 MIL/uL — ABNORMAL LOW (ref 3.87–5.11)
RDW: 15.5 % (ref 11.5–15.5)
WBC: 8.7 10*3/uL (ref 4.0–10.5)

## 2017-12-25 MED ORDER — DEXAMETHASONE SODIUM PHOSPHATE 4 MG/ML IJ SOLN
4.0000 mg | Freq: Four times a day (QID) | INTRAMUSCULAR | Status: AC
  Administered 2017-12-25 – 2017-12-26 (×4): 4 mg via INTRAVENOUS
  Filled 2017-12-25 (×4): qty 1

## 2017-12-25 MED ORDER — SODIUM CHLORIDE 0.9% FLUSH: 10.0000 mL | INTRAVENOUS | Status: DC | PRN

## 2017-12-25 MED ORDER — VANCOMYCIN HCL 10 G IV SOLR
2500.0000 mg | Freq: Once | INTRAVENOUS | Status: DC
  Filled 2017-12-25: qty 2500

## 2017-12-25 MED ORDER — VANCOMYCIN HCL 10 G IV SOLR
2500.0000 mg | Freq: Once | INTRAVENOUS | Status: AC
  Administered 2017-12-25: 2500 mg via INTRAVENOUS
  Filled 2017-12-25: qty 2500

## 2017-12-25 MED ORDER — VANCOMYCIN HCL 10 G IV SOLR
1500.0000 mg | Freq: Three times a day (TID) | INTRAVENOUS | Status: DC
  Filled 2017-12-25: qty 1500

## 2017-12-25 MED ORDER — HYDROCORTISONE 1 % EX CREA
1.0000 "application " | TOPICAL_CREAM | Freq: Two times a day (BID) | CUTANEOUS | Status: DC
  Administered 2017-12-25 – 2018-01-16 (×40): 1 via TOPICAL
  Filled 2017-12-25 (×3): qty 28

## 2017-12-25 MED ORDER — SODIUM CHLORIDE 0.9% FLUSH
10.0000 mL | Freq: Two times a day (BID) | INTRAVENOUS | Status: DC
  Administered 2017-12-25 (×2): 10 mL
  Administered 2017-12-26: 20 mL

## 2017-12-25 MED ORDER — VANCOMYCIN HCL 10 G IV SOLR
1500.0000 mg | Freq: Three times a day (TID) | INTRAVENOUS | Status: DC
Start: 2017-12-25 — End: 2017-12-27
  Administered 2017-12-26 (×4): 1500 mg via INTRAVENOUS
  Filled 2017-12-25 (×5): qty 1500

## 2017-12-25 NOTE — Progress Notes (Signed)
Pharmacy Antibiotic Note  Sonya Gibson is a 32 y.o. female admitted on 12/20/2017 with possible wound infection.  Pharmacy has been consulted for vancomycin dosing. Patient had lumbar microdiscectomy 12/05/17 and presented 12/20/17 with a severe headache and leaking clear fluid from wound. Wound is now red which is believed to be a skin reaction to the dressing.   Plan: Loading dose Vancomycin 2500 mg IV once  Maintenance dose: Vancomycin 1500 IV every 8 hours.  Goal trough 15-20 mcg/mL.  Height: 5\' 2"  (157.5 cm) Weight: (!) 326 lb (147.9 kg) IBW/kg (Calculated) : 50.1  Temp (24hrs), Avg:99.1 F (37.3 C), Min:97.8 F (36.6 C), Max:101.1 F (38.4 C)  Recent Labs  Lab 12/20/17 0237 12/25/17 0853  WBC 8.4  --   CREATININE 0.89 0.91    Estimated Creatinine Clearance: 125 mL/min (by C-G formula based on SCr of 0.91 mg/dL).  nCrCl: 15900ml/min  Allergies  Allergen Reactions  . Cinnamon Anaphylaxis    (NO "REAL"/TREE BARK CINNAMON)  . Coconut Oil Anaphylaxis    Can eat "fake" coconut  . Lovenox [Enoxaparin] Other (See Comments)    BROKE DOWN THE SKIN AT INJECTION SITE AND CAUSED A WOUND  . Nitrofurantoin Hives    Antimicrobials this admission: 7/8 Vancomycin >>   Dose adjustments this admission:  Microbiology results: 7/8 UCx: collected    Thank you for allowing pharmacy to be a part of this patient's care.  Amanda PeaAnna Love, Ilda BassetPharm D PGY1 Pharmacy Resident  Phone [phone] 12/25/2017      10:02 AM

## 2017-12-25 NOTE — Progress Notes (Signed)
Pt C/O IV pain in upper left arm site, states IV causing severe pain when accessed. Pt states she has had to have several IVs placed this admission. IV is also occluding very easily during IV push administrations and has to be repositioned to get blood return. Paged on call for Neurosurgery x2 to seek advisement, no call back received at this time. IV team consulted to assess. Jorge Nyimothy S Thereasa Iannello

## 2017-12-25 NOTE — Progress Notes (Signed)
Subjective: Patient reports headache, fever, drainage from wound , has not been lying flat as instructed, is sitting up eating at present  Objective: Vital signs in last 24 hours: Temp:  [97.8 F (36.6 C)-101.1 F (38.4 C)] 101.1 F (38.4 C) (07/08 0750) Pulse Rate:  [87-128] 128 (07/08 0750) Resp:  [18-20] 20 (07/08 0750) BP: (106-142)/(67-76) 142/69 (07/08 0750) SpO2:  [89 %-95 %] 91 % (07/08 0750)  Intake/Output from previous day: 07/07 0701 - 07/08 0700 In: 2971.4 [P.O.:1750; I.V.:1221.4] Out: 1100 [Urine:1100] Intake/Output this shift: No intake/output data recorded.  Neurologic: Grossly normal, wound is red but this is a skin reaction to the dressing, some leakage of serosanguinous fluid from inferior part of wound  Lab Results: Lab Results  Component Value Date   WBC 8.4 12/20/2017   HGB 11.8 (L) 12/20/2017   HCT 36.1 12/20/2017   MCV 90.5 12/20/2017   PLT 247 12/20/2017   No results found for: INR, PROTIME BMET Lab Results  Component Value Date   NA 138 12/20/2017   K 4.0 12/20/2017   CL 103 12/20/2017   CO2 25 12/20/2017   GLUCOSE 108 (H) 12/20/2017   BUN 16 12/20/2017   CREATININE 0.89 12/20/2017   CALCIUM 9.2 12/20/2017    Studies/Results: [place] Ekg Site Rite  Result Date: 12/25/2017 If Site Rite image not attached, placement could not be confirmed due to current cardiac rhythm.   Assessment/Plan: CT/ myelo today to rule out CSF leak vs serosanguinous drainage - this may help us avoid re-exploration, though I worry her size may limit ability to get myelogram  picc line for IV access  Flat bedrest  Decadron and hydrocort cream for skin   Estimated body mass index is 59.63 kg/m as calculated from the following:   Height as of this encounter: 5\' 2"  (1.575 m).   Weight as of this encounter: 147.9 kg (326 lb).    LOS: 5 days    Akhil Piscopo S 12/25/2017, 8:28 AM

## 2017-12-25 NOTE — Progress Notes (Signed)
Peripherally Inserted Central Catheter/Midline Placement  The IV Nurse has discussed with the patient and/or persons authorized to consent for the patient, the purpose of this procedure and the potential benefits and risks involved with this procedure.  The benefits include less needle sticks, lab draws from the catheter, and the patient may be discharged home with the catheter. Risks include, but not limited to, infection, bleeding, blood clot (thrombus formation), and puncture of an artery; nerve damage and irregular heartbeat and possibility to perform a PICC exchange if needed/ordered by physician.  Alternatives to this procedure were also discussed.  Bard Power PICC patient education guide, fact sheet on infection prevention and patient information card has been provided to patient /or left at bedside.    PICC/Midline Placement Documentation  PICC Single Lumen 12/25/17 PICC Right Cephalic 40 cm (Active)  Indication for Insertion or Continuance of Line Poor Vasculature-patient has had multiple peripheral attempts or PIVs lasting less than 24 hours 12/25/2017 12:00 PM  Site Assessment Clean;Dry;Intact 12/25/2017 12:00 PM  Line Status Flushed;Blood return noted 12/25/2017 12:00 PM  Dressing Type Transparent 12/25/2017 12:00 PM  Dressing Status Clean;Dry;Intact;Antimicrobial disc in place 12/25/2017 12:00 PM  Dressing Change Due 01/01/18 12/25/2017 12:00 PM       Stacie GlazeJoyce, Raman Featherston Horton 12/25/2017, 12:13 PM

## 2017-12-25 NOTE — Progress Notes (Signed)
Pt been advised multiple times on the purpose of keeping the bed flat, however, patient continues to raise the [facility] above 30 degrees. Will continue to reinforce teaching. Sonya Gibson

## 2017-12-25 NOTE — Plan of Care (Signed)
Patient stable, discussed POC with patient including flat HOB per MD order. Patient up ad lib, sitting at side of bed several times throughout the day when this RN and CNA enter room, re-education provided, denies questions at this time.

## 2017-12-26 ENCOUNTER — Inpatient Hospital Stay (HOSPITAL_COMMUNITY): Payer: BLUE CROSS/BLUE SHIELD

## 2017-12-26 ENCOUNTER — Ambulatory Visit: Payer: Self-pay | Admitting: Nurse Practitioner

## 2017-12-26 ENCOUNTER — Other Ambulatory Visit: Payer: Self-pay | Admitting: Neurological Surgery

## 2017-12-26 LAB — URINE CULTURE: Culture: NO GROWTH

## 2017-12-26 MED ORDER — OXYCODONE HCL 5 MG PO TABS
ORAL_TABLET | ORAL | Status: AC
  Administered 2017-12-26: 10 mg via ORAL
  Filled 2017-12-26: qty 2

## 2017-12-26 MED ORDER — IOPAMIDOL (ISOVUE-M 200) INJECTION 41%
20.0000 mL | Freq: Once | INTRAMUSCULAR | Status: AC
  Administered 2017-12-26: 10 mL via INTRATHECAL

## 2017-12-26 MED ORDER — LIDOCAINE HCL (PF) 1 % IJ SOLN
INTRAMUSCULAR | Status: AC
  Administered 2017-12-26: 2.5 mL via INTRADERMAL
  Filled 2017-12-26: qty 5

## 2017-12-26 MED ORDER — IOPAMIDOL (ISOVUE-M 200) INJECTION 41%
INTRAMUSCULAR | Status: AC
  Administered 2017-12-26: 10 mL via INTRATHECAL
  Filled 2017-12-26: qty 10

## 2017-12-26 MED ORDER — LIDOCAINE HCL (PF) 1 % IJ SOLN
5.0000 mL | Freq: Once | INTRAMUSCULAR | Status: AC
  Administered 2017-12-26: 2.5 mL via INTRADERMAL

## 2017-12-26 NOTE — Consult Note (Signed)
WOC Nurse wound consult note [facility]  No family present. Reason for Consult: Leaking lower back surgical site The patient presents with localized dermatitis to peri-incision skin.  No leakage observed at the time of my assessment. Plan of care:  Aquacel Ag+ hydrofiber [place] [phone]) over incision, cover with ABD pad.  Change daily and prn saturation.  Additional order includes a Sizewise bariatric air mattress with bariatric frame.  Patient's BMI is 59.63 and the current surface is too narrow. Monitor the wound area(s) for worsening of condition such as: Signs/symptoms of infection,  Increase in size,  Development of or worsening of odor, Development of pain, or increased pain at the affected locations.  Notify the medical team if any of these develop.  Thank you for the consult.  Discussed plan of care with the patient and bedside nurse.  WOC nurse will not follow at this time.  Please re-consult the WOC team if needed.  Helmut MusterSherry Dayln Tugwell, RN, MSN, CWOCN, CNS-BC, pager [phone]  .

## 2017-12-26 NOTE — Progress Notes (Signed)
Called and asked pharmacy to see if Zofran could be given, since pt will be having a Myelogram today. Pharmacy stated that would be fine. [name]

## 2017-12-26 NOTE — Progress Notes (Signed)
Pt is out of bed again despite flat bed rest orders. She is sitting on her bed side camode straining. She is leaking from her incision, which remains red from a skin reaction to a bandage or prep or both. On vanco prophylactically. Awaiting myelo but I suspect she'll need repeat wound exploration today.

## 2017-12-26 NOTE — Procedures (Signed)
LP at L1/2 on one pass with 6 inch 22 gauge needle from rt paracentral approach.  12cc Isovue 200 instilled.  Good injection without visible mixed component.  Initial filming negative.  Pt to CT.

## 2017-12-26 NOTE — Progress Notes (Signed)
RN changed pt's dressing per new orders (aquacel Dressing)  from wound RN. Pt tolerated Pt also given a CHG bath. Informed consent signed.

## 2017-12-26 NOTE — Progress Notes (Signed)
Subjective: Patient reports she's doing ok, no change in R foot tingling at times, no weakness, no cauda equina sxs, mid headache  Objective: Vital signs in last 24 hours: Temp:  [97.4 F (36.3 C)-98.2 F (36.8 C)] 97.9 F (36.6 C) (07/09 0414) Pulse Rate:  [86-109] 87 (07/09 0414) Resp:  [18-20] 18 (07/09 0414) BP: (112-121)/(66-80) 120/80 (07/09 1000) SpO2:  [92 %-100 %] 100 % (07/09 1000)  Intake/Output from previous day: 07/08 0701 - 07/09 0700 In: 942.3 [I.V.:348.9; IV Piggyback:593.5] Out: 1950 [Urine:1950] Intake/Output this shift: Total I/O In: -  Out: 600 [Urine:600]  Neurologic: Grossly normal , some drainage thru wound that is stapled, redness improved Lab Results: Lab Results  Component Value Date   WBC 8.7 12/25/2017   HGB 10.8 (L) 12/25/2017   HCT 33.6 (L) 12/25/2017   MCV 92.3 12/25/2017   PLT 303 12/25/2017   No results found for: INR, PROTIME BMET Lab Results  Component Value Date   NA 136 12/25/2017   K 4.3 12/25/2017   CL 101 12/25/2017   CO2 26 12/25/2017   GLUCOSE 138 (H) 12/25/2017   BUN 8 12/25/2017   CREATININE 0.91 12/25/2017   CALCIUM 8.1 (L) 12/25/2017    Studies/Results: Ct Lumbar Spine W Contrast  Result Date: 12/26/2017 CLINICAL DATA:  Previous discectomy L4-5. CSF leak repair. Concern for persistent week. EXAM: LUMBAR MYELOGRAM FLUOROSCOPY TIME:  0 minutes 54 seconds. 1908.03 micro gray meter squared PROCEDURE: After thorough discussion of risks and benefits of the procedure including bleeding, infection, injury to nerves, blood vessels, adjacent structures as well as headache and CSF leak, written and oral informed consent was obtained. Consent was obtained by Dr. Paulina FusiMark Shogry. Time out form was completed. Patient was positioned prone on the fluoroscopy table. Local anesthesia was provided with 1% lidocaine without epinephrine after prepped and draped in the usual sterile fashion. Puncture was performed at L2-3 using a 6 inch 22-gauge  spinal needle via left para median approach. Using a single pass through the dura, the needle was placed within the thecal sac, with return of clear CSF. 12 cc of Isovue 200 was injected into the thecal sac, with normal opacification of the nerve roots and cauda equina consistent with free flow within the subarachnoid space. I personally performed the lumbar puncture and administered the intrathecal contrast. I also personally performed acquisition of the myelogram images. TECHNIQUE: Contiguous axial images were obtained through the Lumbar spine after the intrathecal infusion of infusion. Coronal and sagittal reconstructions were obtained of the axial image sets. COMPARISON:  MRI 12/20/2017. FINDINGS: LUMBAR MYELOGRAM FINDINGS: No abnormality at L3-4 or above. At L4-5, there is mild narrowing of thecal sac. No gross leak is demonstrated at myelography. CT LUMBAR MYELOGRAM FINDINGS: Immediate post myelogram scanning does not demonstrate leaking contrast. Cecal sac is narrowed at the L4-5 level. Fluid is seen posterior to the spinal canal and within the subcutaneous tissues. The patient was brought back 2 hours later for rescanning. At this time, there has been marked increase in density of the fluid posterior to the surgical level and within the superficial fatty tissues, demonstrating/confirming CSF leak in this location. IMPRESSION: CSF leak confirmed at the operative level. Marked increase in density of the fluid posterior to the spinal canal and within the subcutaneous tissues on the delayed imaging. Electronically Signed   By: Paulina FusiMark  Shogry M.D.   On: 12/26/2017 13:49   Dg Myelography Lumbar Inj Lumbosacral  Result Date: 12/26/2017 Paulina FusiShogry, Mark, MD  12/26/2017  9:19 AM LP at L1/2 on one pass with 6 inch 22 gauge needle from rt paracentral approach.  12cc Isovue 200 instilled.  Good injection without visible mixed component.  Initial filming negative.  Pt to CT.  [place] Ekg Site Rite  Result Date: 12/25/2017 If  Site Rite image not attached, placement could not be confirmed due to current cardiac rhythm.   Assessment/Plan: Plan for OR tomorrow for lumbar re exploration for CSF leak repair and placement of lumbar drain. I have explained the procedure to her in detail, the findings on delayed imaging, the reason for further surgery, and she understands the risks include but not limited to bleeding, infection, need for further surgery, lack of relief, nerve injury, NTW, loss of B/B control, and anesthesia risks  Estimated body mass index is 59.63 kg/m as calculated from the following:   Height as of this encounter: 5\' 2"  (1.575 m).   Weight as of this encounter: 147.9 kg (326 lb).    LOS: 6 days    Sonya Gibson S 12/26/2017, 3:05 PM

## 2017-12-27 ENCOUNTER — Inpatient Hospital Stay (HOSPITAL_COMMUNITY): Payer: BLUE CROSS/BLUE SHIELD | Admitting: Certified Registered Nurse Anesthetist

## 2017-12-27 ENCOUNTER — Encounter (HOSPITAL_COMMUNITY)
Admission: EM | Disposition: A | Discharge status: Home Health | Payer: Self-pay | Source: Home / Self Care | Attending: Neurological Surgery

## 2017-12-27 ENCOUNTER — Encounter (HOSPITAL_COMMUNITY): Payer: Self-pay | Admitting: Certified Registered Nurse Anesthetist

## 2017-12-27 HISTORY — PX: PLACEMENT OF LUMBAR DRAIN: SHX6028

## 2017-12-27 HISTORY — PX: LUMBAR WOUND DEBRIDEMENT: SHX1988

## 2017-12-27 LAB — BASIC METABOLIC PANEL
Anion gap: 7 (ref 5–15)
BUN: 7 mg/dL (ref 6–20)
CHLORIDE: 104 mmol/L (ref 98–111)
CO2: 30 mmol/L (ref 22–32)
CREATININE: 0.76 mg/dL (ref 0.44–1.00)
Calcium: 8.2 mg/dL — ABNORMAL LOW (ref 8.9–10.3)
Glucose, Bld: 154 mg/dL — ABNORMAL HIGH (ref 70–99)
POTASSIUM: 3.8 mmol/L (ref 3.5–5.1)
SODIUM: 141 mmol/L (ref 135–145)

## 2017-12-27 LAB — VANCOMYCIN, TROUGH: Vancomycin Tr: 20 ug/mL (ref 15–20)

## 2017-12-27 SURGERY — LUMBAR WOUND DEBRIDEMENT
Anesthesia: General | Site: Spine Lumbar

## 2017-12-27 MED ORDER — CEFAZOLIN SODIUM 1 G IJ SOLR
INTRAMUSCULAR | Status: AC
  Filled 2017-12-27: qty 10

## 2017-12-27 MED ORDER — ROCURONIUM BROMIDE 10 MG/ML (PF) SYRINGE
PREFILLED_SYRINGE | INTRAVENOUS | Status: AC
  Filled 2017-12-27: qty 10

## 2017-12-27 MED ORDER — BACITRACIN ZINC 500 UNIT/GM EX OINT
TOPICAL_OINTMENT | CUTANEOUS | Status: DC | PRN
  Administered 2017-12-27: 1 via TOPICAL

## 2017-12-27 MED ORDER — OXYCODONE HCL 5 MG PO TABS
5.0000 mg | ORAL_TABLET | ORAL | Status: DC | PRN
  Administered 2017-12-27 – 2018-01-01 (×19): 5 mg via ORAL
  Filled 2017-12-27 (×19): qty 1

## 2017-12-27 MED ORDER — ACETAMINOPHEN 650 MG RE SUPP: 650.0000 mg | RECTAL | Status: DC | PRN

## 2017-12-27 MED ORDER — POTASSIUM CHLORIDE IN NACL 20-0.9 MEQ/L-% IV SOLN
INTRAVENOUS | Status: DC
  Administered 2017-12-27 – 2017-12-30 (×6): via INTRAVENOUS
  Administered 2017-12-31: 75 mL/h via INTRAVENOUS
  Administered 2018-01-01 – 2018-01-02 (×2): 1 mL via INTRAVENOUS
  Administered 2018-01-02 – 2018-01-08 (×9): via INTRAVENOUS
  Filled 2017-12-27 (×24): qty 1000

## 2017-12-27 MED ORDER — SCOPOLAMINE 1 MG/3DAYS TD PT72
MEDICATED_PATCH | TRANSDERMAL | Status: AC
  Filled 2017-12-27: qty 1

## 2017-12-27 MED ORDER — SENNA 8.6 MG PO TABS
1.0000 | ORAL_TABLET | Freq: Two times a day (BID) | ORAL | Status: DC
  Administered 2017-12-27 – 2018-01-16 (×31): 8.6 mg via ORAL
  Filled 2017-12-27 (×35): qty 1

## 2017-12-27 MED ORDER — SODIUM CHLORIDE 0.9 % IV SOLN
INTRAVENOUS | Status: DC | PRN
  Administered 2017-12-27: 500 mL

## 2017-12-27 MED ORDER — GLYCOPYRROLATE 0.2 MG/ML IJ SOLN
INTRAMUSCULAR | Status: DC | PRN
  Administered 2017-12-27: 0.1 mg via INTRAVENOUS

## 2017-12-27 MED ORDER — LIDOCAINE 2% (20 MG/ML) 5 ML SYRINGE
INTRAMUSCULAR | Status: AC
  Filled 2017-12-27: qty 10

## 2017-12-27 MED ORDER — SUGAMMADEX SODIUM 500 MG/5ML IV SOLN
INTRAVENOUS | Status: AC
  Filled 2017-12-27: qty 5

## 2017-12-27 MED ORDER — PHENYLEPHRINE 40 MCG/ML (10ML) SYRINGE FOR IV PUSH (FOR BLOOD PRESSURE SUPPORT)
PREFILLED_SYRINGE | INTRAVENOUS | Status: AC
  Filled 2017-12-27: qty 10

## 2017-12-27 MED ORDER — DEXAMETHASONE SODIUM PHOSPHATE 10 MG/ML IJ SOLN
10.0000 mg | INTRAMUSCULAR | Status: DC
  Filled 2017-12-27: qty 1

## 2017-12-27 MED ORDER — FENTANYL CITRATE (PF) 250 MCG/5ML IJ SOLN
INTRAMUSCULAR | Status: AC
  Filled 2017-12-27: qty 5

## 2017-12-27 MED ORDER — SODIUM CHLORIDE 0.9% FLUSH
3.0000 mL | Freq: Two times a day (BID) | INTRAVENOUS | Status: DC
  Administered 2017-12-27 – 2018-01-02 (×6): 3 mL via INTRAVENOUS

## 2017-12-27 MED ORDER — PROPOFOL 10 MG/ML IV BOLUS
INTRAVENOUS | Status: AC
  Filled 2017-12-27: qty 40

## 2017-12-27 MED ORDER — SCOPOLAMINE 1 MG/3DAYS TD PT72
MEDICATED_PATCH | TRANSDERMAL | Status: DC | PRN
  Administered 2017-12-27: 1 via TRANSDERMAL

## 2017-12-27 MED ORDER — BUPIVACAINE HCL (PF) 0.25 % IJ SOLN
INTRAMUSCULAR | Status: AC
  Filled 2017-12-27: qty 30

## 2017-12-27 MED ORDER — HYDROMORPHONE HCL 1 MG/ML IJ SOLN
0.5000 mg | INTRAMUSCULAR | Status: DC | PRN
  Administered 2017-12-27 – 2018-01-01 (×20): 0.5 mg via INTRAVENOUS
  Filled 2017-12-27 (×20): qty 1

## 2017-12-27 MED ORDER — LACTATED RINGERS IV SOLN
INTRAVENOUS | Status: DC
  Administered 2017-12-27: 12:00:00 via INTRAVENOUS

## 2017-12-27 MED ORDER — ONDANSETRON HCL 4 MG/2ML IJ SOLN
4.0000 mg | Freq: Four times a day (QID) | INTRAMUSCULAR | Status: DC | PRN
  Administered 2017-12-28 – 2018-01-16 (×21): 4 mg via INTRAVENOUS
  Filled 2017-12-27 (×23): qty 2

## 2017-12-27 MED ORDER — BACITRACIN ZINC 500 UNIT/GM EX OINT
TOPICAL_OINTMENT | CUTANEOUS | Status: AC
  Filled 2017-12-27: qty 28.35

## 2017-12-27 MED ORDER — OXYCODONE HCL 5 MG PO TABS: 5.0000 mg | ORAL_TABLET | Freq: Once | ORAL | Status: DC | PRN

## 2017-12-27 MED ORDER — VANCOMYCIN HCL 1000 MG IV SOLR
INTRAVENOUS | Status: DC | PRN
  Administered 2017-12-27: 1000 mg

## 2017-12-27 MED ORDER — ONDANSETRON HCL 4 MG PO TABS: 4.0000 mg | ORAL_TABLET | Freq: Four times a day (QID) | ORAL | Status: DC | PRN

## 2017-12-27 MED ORDER — MIDAZOLAM HCL 5 MG/5ML IJ SOLN
INTRAMUSCULAR | Status: DC | PRN
  Administered 2017-12-27: 1 mg via INTRAVENOUS

## 2017-12-27 MED ORDER — ACETAMINOPHEN 325 MG PO TABS
650.0000 mg | ORAL_TABLET | ORAL | Status: DC | PRN
  Administered 2018-01-01 – 2018-01-06 (×5): 650 mg via ORAL
  Filled 2017-12-27 (×7): qty 2

## 2017-12-27 MED ORDER — CHLORHEXIDINE GLUCONATE CLOTH 2 % EX PADS: 6.0000 | MEDICATED_PAD | Freq: Once | CUTANEOUS | Status: DC

## 2017-12-27 MED ORDER — FENTANYL CITRATE (PF) 100 MCG/2ML IJ SOLN
INTRAMUSCULAR | Status: AC
  Filled 2017-12-27: qty 2

## 2017-12-27 MED ORDER — SUCCINYLCHOLINE CHLORIDE 20 MG/ML IJ SOLN
INTRAMUSCULAR | Status: DC | PRN
  Administered 2017-12-27: 160 mg via INTRAVENOUS

## 2017-12-27 MED ORDER — PROPOFOL 10 MG/ML IV BOLUS
INTRAVENOUS | Status: DC | PRN
  Administered 2017-12-27: 160 mg via INTRAVENOUS
  Administered 2017-12-27: 100 mg via INTRAVENOUS

## 2017-12-27 MED ORDER — MIDAZOLAM HCL 2 MG/2ML IJ SOLN
INTRAMUSCULAR | Status: AC
Start: 2017-12-27 — End: ?
  Filled 2017-12-27: qty 2

## 2017-12-27 MED ORDER — FENTANYL CITRATE (PF) 100 MCG/2ML IJ SOLN
INTRAMUSCULAR | Status: DC | PRN
  Administered 2017-12-27 (×6): 50 ug via INTRAVENOUS

## 2017-12-27 MED ORDER — LACTATED RINGERS IV SOLN
INTRAVENOUS | Status: DC | PRN
  Administered 2017-12-27 (×2): via INTRAVENOUS

## 2017-12-27 MED ORDER — ROCURONIUM BROMIDE 100 MG/10ML IV SOLN
INTRAVENOUS | Status: DC | PRN
  Administered 2017-12-27: 10 mg via INTRAVENOUS
  Administered 2017-12-27: 50 mg via INTRAVENOUS

## 2017-12-27 MED ORDER — THROMBIN (RECOMBINANT) 20000 UNITS EX SOLR
CUTANEOUS | Status: AC
  Filled 2017-12-27: qty 20000

## 2017-12-27 MED ORDER — MENTHOL 3 MG MT LOZG: 1.0000 | LOZENGE | OROMUCOSAL | Status: DC | PRN

## 2017-12-27 MED ORDER — SUCCINYLCHOLINE CHLORIDE 200 MG/10ML IV SOSY
PREFILLED_SYRINGE | INTRAVENOUS | Status: AC
  Filled 2017-12-27: qty 10

## 2017-12-27 MED ORDER — DEXAMETHASONE SODIUM PHOSPHATE 10 MG/ML IJ SOLN
INTRAMUSCULAR | Status: DC | PRN
  Administered 2017-12-27: 10 mg via INTRAVENOUS

## 2017-12-27 MED ORDER — HEMOSTATIC AGENTS (NO CHARGE) OPTIME
TOPICAL | Status: DC | PRN
  Administered 2017-12-27: 1 via TOPICAL

## 2017-12-27 MED ORDER — 0.9 % SODIUM CHLORIDE (POUR BTL) OPTIME
TOPICAL | Status: DC | PRN
  Administered 2017-12-27: 1000 mL

## 2017-12-27 MED ORDER — DEXAMETHASONE SODIUM PHOSPHATE 10 MG/ML IJ SOLN
INTRAMUSCULAR | Status: AC
Start: 2017-12-27 — End: ?
  Filled 2017-12-27: qty 1

## 2017-12-27 MED ORDER — SUGAMMADEX SODIUM 200 MG/2ML IV SOLN
INTRAVENOUS | Status: DC | PRN
  Administered 2017-12-27: 300 mg via INTRAVENOUS

## 2017-12-27 MED ORDER — VANCOMYCIN HCL 10 G IV SOLR
1250.0000 mg | Freq: Three times a day (TID) | INTRAVENOUS | Status: DC
Start: 2017-12-27 — End: 2018-01-04
  Administered 2017-12-27 – 2018-01-04 (×24): 1250 mg via INTRAVENOUS
  Filled 2017-12-27 (×27): qty 1250

## 2017-12-27 MED ORDER — GLYCOPYRROLATE PF 0.2 MG/ML IJ SOSY
PREFILLED_SYRINGE | INTRAMUSCULAR | Status: AC
  Filled 2017-12-27: qty 1

## 2017-12-27 MED ORDER — PHENOL 1.4 % MT LIQD: 1.0000 | OROMUCOSAL | Status: DC | PRN

## 2017-12-27 MED ORDER — CEFAZOLIN SODIUM-DEXTROSE 2-4 GM/100ML-% IV SOLN
2.0000 g | INTRAVENOUS | Status: AC
  Administered 2017-12-27: 3 g via INTRAVENOUS
  Filled 2017-12-27: qty 100

## 2017-12-27 MED ORDER — OXYCODONE HCL 5 MG/5ML PO SOLN: 5.0000 mg | Freq: Once | ORAL | Status: DC | PRN

## 2017-12-27 MED ORDER — LIDOCAINE 2% (20 MG/ML) 5 ML SYRINGE
INTRAMUSCULAR | Status: AC
  Filled 2017-12-27: qty 5

## 2017-12-27 MED ORDER — ONDANSETRON HCL 4 MG/2ML IJ SOLN
INTRAMUSCULAR | Status: DC | PRN
  Administered 2017-12-27: 4 mg via INTRAVENOUS

## 2017-12-27 MED ORDER — SODIUM CHLORIDE 0.9 % IV SOLN
250.0000 mL | INTRAVENOUS | Status: DC
  Administered 2017-12-27 – 2017-12-29 (×2): 250 mL via INTRAVENOUS

## 2017-12-27 MED ORDER — ROCURONIUM BROMIDE 10 MG/ML (PF) SYRINGE
PREFILLED_SYRINGE | INTRAVENOUS | Status: AC
  Filled 2017-12-27: qty 20

## 2017-12-27 MED ORDER — ONDANSETRON HCL 4 MG/2ML IJ SOLN
INTRAMUSCULAR | Status: AC
  Filled 2017-12-27: qty 2

## 2017-12-27 MED ORDER — ONDANSETRON HCL 4 MG/2ML IJ SOLN: 4.0000 mg | Freq: Once | INTRAMUSCULAR | Status: DC | PRN

## 2017-12-27 MED ORDER — SODIUM CHLORIDE 0.9% FLUSH: 3.0000 mL | INTRAVENOUS | Status: DC | PRN

## 2017-12-27 MED ORDER — VANCOMYCIN HCL 1000 MG IV SOLR
INTRAVENOUS | Status: AC
  Filled 2017-12-27: qty 1000

## 2017-12-27 MED ORDER — FENTANYL CITRATE (PF) 100 MCG/2ML IJ SOLN
25.0000 ug | INTRAMUSCULAR | Status: DC | PRN
  Administered 2017-12-27 (×3): 50 ug via INTRAVENOUS

## 2017-12-27 SURGICAL SUPPLY — 71 items
APL SKNCLS STERI-STRIP NONHPOA (GAUZE/BANDAGES/DRESSINGS) ×2
APL SRG 60D 8 XTD TIP BNDBL (TIP) ×2
BAG DECANTER FOR FLEXI CONT (MISCELLANEOUS) ×5 IMPLANT
BENZOIN TINCTURE PRP APPL 2/3 (GAUZE/BANDAGES/DRESSINGS) ×4 IMPLANT
CANISTER SUCT 3000ML PPV (MISCELLANEOUS) ×4 IMPLANT
CARTRIDGE OIL MAESTRO DRILL (MISCELLANEOUS) ×1 IMPLANT
CLOSURE STERI-STRIP 1/2X4 (GAUZE/BANDAGES/DRESSINGS) ×1
CLOSURE WOUND 1/2 X4 (GAUZE/BANDAGES/DRESSINGS) ×1
CLSR STERI-STRIP ANTIMIC 1/2X4 (GAUZE/BANDAGES/DRESSINGS) ×2 IMPLANT
CONT SPEC 4OZ CLIKSEAL STRL BL (MISCELLANEOUS) ×4 IMPLANT
COVER BACK TABLE 60X90IN (DRAPES) ×4 IMPLANT
DIFFUSER DRILL AIR PNEUMATIC (MISCELLANEOUS) ×1 IMPLANT
DRAIN SUBARACHNOID (WOUND CARE) ×4 IMPLANT
DRAPE C-ARM 42X72 X-RAY (DRAPES) IMPLANT
DRAPE INCISE IOBAN 66X45 STRL (DRAPES) IMPLANT
DRAPE LAPAROTOMY 100X72X124 (DRAPES) ×4 IMPLANT
DRAPE POUCH INSTRU U-SHP 10X18 (DRAPES) ×1 IMPLANT
DRSG OPSITE POSTOP 4X6 (GAUZE/BANDAGES/DRESSINGS) ×3 IMPLANT
DRSG OPSITE POSTOP 4X8 (GAUZE/BANDAGES/DRESSINGS) ×3 IMPLANT
DURAPREP 26ML APPLICATOR (WOUND CARE) ×4 IMPLANT
DURAPREP 6ML APPLICATOR 50/CS (WOUND CARE) IMPLANT
DURASEAL APPLICATOR TIP (TIP) ×3 IMPLANT
DURASEAL SPINE SEALANT 3ML (MISCELLANEOUS) ×3 IMPLANT
ELECT REM PT RETURN 9FT ADLT (ELECTROSURGICAL) ×4
ELECTRODE REM PT RTRN 9FT ADLT (ELECTROSURGICAL) ×2 IMPLANT
FLOSEAL 5ML (HEMOSTASIS) ×3 IMPLANT
GAUZE SPONGE 4X4 16PLY XRAY LF (GAUZE/BANDAGES/DRESSINGS) ×1 IMPLANT
GLOVE BIO SURGEON STRL SZ7 (GLOVE) ×3 IMPLANT
GLOVE BIO SURGEON STRL SZ8 (GLOVE) ×5 IMPLANT
GLOVE BIOGEL PI IND STRL 6.5 (GLOVE) ×3 IMPLANT
GLOVE BIOGEL PI IND STRL 7.0 (GLOVE) ×1 IMPLANT
GLOVE BIOGEL PI INDICATOR 6.5 (GLOVE) ×6
GLOVE BIOGEL PI INDICATOR 7.0 (GLOVE) ×2
GLOVE EXAM NITRILE LRG STRL (GLOVE) IMPLANT
GLOVE EXAM NITRILE XL STR (GLOVE) IMPLANT
GLOVE EXAM NITRILE XS STR PU (GLOVE) IMPLANT
GLOVE SURG SS PI 6.5 STRL IVOR (GLOVE) ×12 IMPLANT
GOWN STRL REUS W/ TWL LRG LVL3 (GOWN DISPOSABLE) ×4 IMPLANT
GOWN STRL REUS W/ TWL XL LVL3 (GOWN DISPOSABLE) ×2 IMPLANT
GOWN STRL REUS W/TWL 2XL LVL3 (GOWN DISPOSABLE) ×1 IMPLANT
GOWN STRL REUS W/TWL LRG LVL3 (GOWN DISPOSABLE) ×12
GOWN STRL REUS W/TWL XL LVL3 (GOWN DISPOSABLE) ×4
KIT BASIN OR (CUSTOM PROCEDURE TRAY) ×4 IMPLANT
KIT DRAIN CSF ACCUDRAIN (MISCELLANEOUS) ×3 IMPLANT
KIT TURNOVER KIT B (KITS) ×8 IMPLANT
MARKER SKIN DUAL TIP RULER LAB (MISCELLANEOUS) ×4 IMPLANT
NDL HYPO 18GX1.5 BLUNT FILL (NEEDLE) IMPLANT
NDL HYPO 25X1 1.5 SAFETY (NEEDLE) ×1 IMPLANT
NDL SPNL 20GX3.5 QUINCKE YW (NEEDLE) IMPLANT
NEEDLE HYPO 18GX1.5 BLUNT FILL (NEEDLE) IMPLANT
NEEDLE HYPO 22GX1.5 SAFETY (NEEDLE) ×4 IMPLANT
NEEDLE HYPO 25X1 1.5 SAFETY (NEEDLE) ×4 IMPLANT
NEEDLE SPNL 20GX3.5 QUINCKE YW (NEEDLE) IMPLANT
NS IRRIG 1000ML POUR BTL (IV SOLUTION) ×4 IMPLANT
OIL CARTRIDGE MAESTRO DRILL (MISCELLANEOUS)
PACK LAMINECTOMY NEURO (CUSTOM PROCEDURE TRAY) ×4 IMPLANT
PAD ARMBOARD 7.5X6 YLW CONV (MISCELLANEOUS) ×16 IMPLANT
STRIP CLOSURE SKIN 1/2X4 (GAUZE/BANDAGES/DRESSINGS) ×3 IMPLANT
SUT ETHILON 3 0 FSL (SUTURE) ×3 IMPLANT
SUT NURALON 4 0 TR CR/8 (SUTURE) ×3 IMPLANT
SUT VIC AB 0 CT1 18XCR BRD8 (SUTURE) ×3 IMPLANT
SUT VIC AB 0 CT1 8-18 (SUTURE) ×8
SUT VIC AB 2-0 CP2 18 (SUTURE) ×4 IMPLANT
SUT VIC AB 3-0 SH 8-18 (SUTURE) ×7 IMPLANT
SWAB COLLECTION DEVICE MRSA (MISCELLANEOUS) ×1 IMPLANT
SWAB CULTURE ESWAB REG 1ML (MISCELLANEOUS) ×1 IMPLANT
SYR 3ML LL SCALE MARK (SYRINGE) IMPLANT
SYR CONTROL 10ML LL (SYRINGE) ×4 IMPLANT
TOWEL GREEN STERILE (TOWEL DISPOSABLE) ×5 IMPLANT
TOWEL GREEN STERILE FF (TOWEL DISPOSABLE) ×5 IMPLANT
WATER STERILE IRR 1000ML POUR (IV SOLUTION) ×4 IMPLANT

## 2017-12-27 NOTE — Anesthesia Procedure Notes (Addendum)
Procedure Name: Intubation Date/Time: 12/27/2017 1:15 PM Performed by: Julian ReilWelty, Ram Haugan F, CRNA Pre-anesthesia Checklist: Patient identified, Emergency Drugs available, Suction available, Patient being monitored and Timeout performed Patient Re-evaluated:Patient Re-evaluated prior to induction Oxygen Delivery Method: Circle system utilized Preoxygenation: Pre-oxygenation with 100% oxygen Induction Type: IV induction, Rapid sequence and Cricoid Pressure applied Ventilation: Oral airway inserted - appropriate to patient size and Two handed mask ventilation required Laryngoscope Size: Glidescope and 4 Grade View: Grade I Tube type: Oral Tube size: 7.0 mm Number of attempts: 2 Airway Equipment and Method: Stylet and Video-laryngoscopy Placement Confirmation: ETT inserted through vocal cords under direct vision,  positive ETCO2 and breath sounds checked- equal and bilateral Secured at: 21 cm Tube secured with: Tape Dental Injury: Teeth and Oropharynx as per pre-operative assessment  Difficulty Due To: Difficulty was anticipated, Difficult Airway- due to large tongue and Difficult Airway- due to reduced neck mobility Comments: Pre-op noted chipped upper right front tooth and tissue mass left corner of mouth.  Patient states "I chew that."  1st DL with Glidescope 4 per [facility] HospitalWel[facility], CRNA and with grade 1 view but unable to align ETT with glottic opening.  Mask vent with oral airway, 2 hand mask.  2nd DL with Glidescope 4 per Dr Noreene LarssonJoslin, Grade 1 view and ETT passed without difficulty, +EtCO2. 4x4s bite block used.

## 2017-12-27 NOTE — Anesthesia Postprocedure Evaluation (Signed)
Anesthesia Post Note  Patient: Sonya Gibson  Procedure(s) Performed: Re-exploration for CSF leak repair and placement of lumbar drain (N/A Spine Lumbar) PLACEMENT OF LUMBAR DRAIN (N/A Spine Lumbar)     Patient location during evaluation: PACU Anesthesia Type: General Level of consciousness: awake Pain management: pain level controlled Vital Signs Assessment: post-procedure vital signs reviewed and stable Respiratory status: spontaneous breathing, nonlabored ventilation, respiratory function stable and patient connected to nasal cannula oxygen Cardiovascular status: blood pressure returned to baseline and stable Postop Assessment: no apparent nausea or vomiting Anesthetic complications: no    Last Vitals:  Vitals:   12/27/17 1518 12/27/17 1533  BP: (!) 142/94 (!) 130/91  Pulse: 97 90  Resp: (!) 21 17  Temp:    SpO2: 91% 94%    Last Pain:  Vitals:   12/27/17 1533  TempSrc:   PainSc: Asleep                 Ryan P Ellender

## 2017-12-27 NOTE — Transfer of Care (Signed)
Immediate Anesthesia Transfer of Care Note  Patient: Sonya Gibson  Procedure(s) Performed: Re-exploration for CSF leak repair and placement of lumbar drain (N/A Spine Lumbar) PLACEMENT OF LUMBAR DRAIN (N/A Spine Lumbar)  Patient Location: PACU  Anesthesia Type:General  Level of Consciousness: drowsy  Airway & Oxygen Therapy: Patient Spontanous Breathing and Patient connected to face mask oxygen  Post-op Assessment: Report given to RN and Post -op Vital signs reviewed and stable  Post vital signs: Reviewed and stable  Last Vitals:  Vitals Value Taken Time  BP 148/91 12/27/2017  3:03 PM  Temp    Pulse 99 12/27/2017  3:06 PM  Resp 20 12/27/2017  3:06 PM  SpO2 98 % 12/27/2017  3:06 PM  Vitals shown include unvalidated device data.  Last Pain:  Vitals:   12/27/17 1142  TempSrc:   PainSc: Asleep      Patients Stated Pain Goal: 2 (12/27/17 1100)  Complications: No apparent anesthesia complications

## 2017-12-27 NOTE — Anesthesia Preprocedure Evaluation (Addendum)
Anesthesia Evaluation  Patient identified by MRN, date of birth, ID band Patient awake    Reviewed: Allergy & Precautions, NPO status , Patient's Chart, lab work & pertinent test results  Airway Mallampati: III  TM Distance: >3 FB Neck ROM: Full    Dental  (+) Teeth Intact, Dental Advisory Given   Pulmonary    breath sounds clear to auscultation       Cardiovascular hypertension,  Rhythm:Regular Rate:Normal     Neuro/Psych    GI/Hepatic   Endo/Other    Renal/GU      Musculoskeletal   Abdominal (+) + obese,   Peds  Hematology   Anesthesia Other Findings   Reproductive/Obstetrics                            Anesthesia Physical Anesthesia Plan  ASA: III  Anesthesia Plan: General   Post-op Pain Management:    Induction: Intravenous  PONV Risk Score and Plan: Ondansetron and Dexamethasone  Airway Management Planned: Oral ETT  Additional Equipment:   Intra-op Plan:   Post-operative Plan: Extubation in OR  Informed Consent: I have reviewed the patients History and Physical, chart, labs and discussed the procedure including the risks, benefits and alternatives for the proposed anesthesia with the patient or authorized representative who has indicated his/her understanding and acceptance.   Dental advisory given  Plan Discussed with: CRNA and Anesthesiologist  Anesthesia Plan Comments:        Anesthesia Quick Evaluation  

## 2017-12-27 NOTE — Progress Notes (Signed)
RN has found pt sitting up; despite bedrest orders; and being educated about her CSF leak. RN re-educated the pt and advised that laying back down would help with the headache pain.

## 2017-12-27 NOTE — Op Note (Signed)
12/27/2017  2:59 PM  PATIENT:  Sonya Gibson  32 y.o. female  PRE-OPERATIVE DIAGNOSIS:  Recurrent pseudomeningocele  POST-OPERATIVE DIAGNOSIS:  same  PROCEDURE:  Lumbar reexploration with attempted repair of CSF leak followed by placement of lumbar drain  SURGEON:  Marikay Alaravid Kameelah Minish, MD  ASSISTANTS: Leo GrosserKim meyran FNP  ANESTHESIA:   General  EBL: minimal ml  Total I/O In: 1041.1 [I.V.:1041.1] Out: 1100 [Urine:1100]  BLOOD ADMINISTERED: none  DRAINS: lumbar drain  SPECIMEN:  none  INDICATION FOR PROCEDURE: This patient presented with recurrent CSF leak. Imaging showedCSF leak. The patient tried conservative measures without relief. Pain was debilitating. Recommended lumbar reexploration with attempted repair of CSF leak followed by placement of lumbar drain. Patient understood the risks, benefits, and alternatives and potential outcomes and wished to proceed.  PROCEDURE DETAILS: The patient was taken to the operating room and after induction of adequate generalized endotracheal anesthesia, the patient was rolled into the prone position on the Wilson frame and all pressure points were padded. The lumbar region was cleaned and prepped with a done scrub and DuraPrep and draped in the usual sterile fashion. 5 cc of local anesthesia was injected and then a dorsal midline incision was made and carried down to the lumbo sacral fascia. There was immediate release of fluid. We dissected down through the tissues and opened the fascia and placed our retractor. I sucked away the old fibrin glue and Gelfoam. The CSF was coming from the repair site coming from the suture entry and exit sites. I tried to oversew this with a single 4-0 Nurolon suture but again she leaked from where the suture went in and out of the dura. Therefore I used a bar drain needle and passed it through the L3-4 interspace with good flow of CSF under significant pressure. We placed our lumbar drain to a depth of about 15 cm and removed  the needle. We passed the lumbar drain through a separate stab incision and sewed it to the skin. We had good flow of clear CSF and this immediately stopped any drainage from the repair site. We checked with Valsalva to make sure that there was no continued CSF leak. I removed a piece of fat from the subcutaneous tissues. I irrigated with saline solution containing bacitracin. Achieved hemostasis with bipolar cautery, lined the dura with the fat and Tisseel fibrin glue, placed powdered vancomycin into the wound and then closed the fascia with 0 Vicryl. I closed the subcutaneous tissues with layers of 1-0 Vicryl and2-0 Vicryl and the subcuticular tissues with 3-0 Vicryl. The skin was then closed with a running 4-0 Ethilon suture. The drapes were removed, a sterile dressing was applied. The patient was awakened from general anesthesia and transferred to the recovery room in stable condition. At the end of the procedure all sponge, needle and instrument counts were correct.    PLAN OF CARE: Admit to inpatient   PATIENT DISPOSITION:  PACU - hemodynamically stable.   Delay start of Pharmacological VTE agent (>24hrs) due to surgical blood loss or risk of bleeding:  yes

## 2017-12-27 NOTE — Progress Notes (Signed)
Pharmacy Antibiotic Note  Sonya Gibson is a 32 y.o. female admitted on 12/20/2017 with possible wound infection.  Pharmacy has been consulted for vancomycin dosing. Patient had lumbar microdiscectomy 12/05/17 and presented 12/20/17 with a severe headache and leaking clear fluid from wound. Wound is now red which is believed to be a skin reaction to the dressing.  Vancomycin trough this am 20 mg/L  Plan: Change vancomycin to 1250 mg IV q8 hours F/u renal function, cultures and clinical course  Height: 5\' 2"  (157.5 cm) Weight: (!) 326 lb (147.9 kg) IBW/kg (Calculated) : 50.1  Temp (24hrs), Avg:98.3 F (36.8 C), Min:97.8 F (36.6 C), Max:99.3 F (37.4 C)  Recent Labs  Lab 12/25/17 0853 12/25/17 1017 12/27/17 0500 12/27/17 0530  WBC  --  8.7  --   --   CREATININE 0.91  --  0.76  --   VANCOTROUGH  --   --   --  20    Estimated Creatinine Clearance: 142.2 mL/min (by C-G formula based on SCr of 0.76 mg/dL).  nCrCl: 15300ml/min  Allergies  Allergen Reactions  . Cinnamon Anaphylaxis    (NO "REAL"/TREE BARK CINNAMON)  . Coconut Oil Anaphylaxis    Can eat "fake" coconut  . Lovenox [Enoxaparin] Other (See Comments)    BROKE DOWN THE SKIN AT INJECTION SITE AND CAUSED A WOUND  . Nitrofurantoin Hives    Antimicrobials this admission: 7/8 Vancomycin >>   Dose adjustments this admission: 7/10:  Dose decreased from 1500 q8 to 1250 q8 Microbiology results: 7/8 UCx: collected    Thank you for allowing pharmacy to be a part of this patient's care.  Talbert CageLora Nakia Remmers, Pharm D 12/27/2017      6:30 AM

## 2017-12-27 NOTE — Progress Notes (Signed)
Pt's belonging were transferred to 4N31. Pt belonging included clothes, black debit card, sun glasses, food items and plastic rings.

## 2017-12-28 ENCOUNTER — Encounter (HOSPITAL_COMMUNITY): Payer: Self-pay | Admitting: Neurological Surgery

## 2017-12-28 NOTE — Progress Notes (Signed)
Patient resting comfortably and sleeping at the moment. I did not awake her at this time because she is fairly noncompliant with flat bed rest and lying still. Nurses have great concern with how much she turns in the bed she will pull her lumbar drain out. The lumbar drain is dripping nicely and has clear CSF. When the patient is awake she is complaining of leg pain. Nurses state she is moving everything well. I will check on her later when she is awake.

## 2017-12-29 LAB — VANCOMYCIN, TROUGH: Vancomycin Tr: 17 ug/mL (ref 15–20)

## 2017-12-29 NOTE — Progress Notes (Signed)
Pharmacy Antibiotic Note  Sonya Gibson is a 32 y.o. female admitted on 12/20/2017 with possible wound infection.  Pharmacy has been consulted for vancomycin dosing. Patient had lumbar microdiscectomy 12/05/17 and presented 12/20/17 with a severe headache and leaking clear fluid from wound. Wound is now red which is believed to be a skin reaction to the dressing.  Vancomycin trough this evening 17 mg/L  Plan: Continue vancomycin at 1250 mg IV q8 hours F/u renal function, cultures and clinical course  Height: 5\' 2"  (157.5 cm) Weight: (!) 344 lb 9.3 oz (156.3 kg) IBW/kg (Calculated) : 50.1  Temp (24hrs), Avg:99 F (37.2 C), Min:98.3 F (36.8 C), Max:100 F (37.8 C)  Recent Labs  Lab 12/25/17 0853 12/25/17 1017 12/27/17 0500 12/27/17 0530 12/29/17 1559  WBC  --  8.7  --   --   --   CREATININE 0.91  --  0.76  --   --   VANCOTROUGH  --   --   --  20 17    Estimated Creatinine Clearance: 147.6 mL/min (by C-G formula based on SCr of 0.76 mg/dL).  nCrCl: 13600ml/min  Allergies  Allergen Reactions  . Cinnamon Anaphylaxis    (NO "REAL"/TREE BARK CINNAMON)  . Coconut Oil Anaphylaxis    Can eat "fake" coconut  . Lovenox [Enoxaparin] Other (See Comments)    BROKE DOWN THE SKIN AT INJECTION SITE AND CAUSED A WOUND  . Nitrofurantoin Hives    Antimicrobials this admission: 7/8 Vancomycin >>   Dose adjustments this admission: 7/10:  Dose decreased from 1500 q8 to 1250 q8 Microbiology results: 7/8 UCx: collected    Thank you for allowing pharmacy to be a part of this patient's care.  [address]Cathy Kerrie Latour, Pharm D, [place] 12/29/2017      5:22 PM

## 2017-12-29 NOTE — Progress Notes (Addendum)
Subjective: Patient reports one episode of headache last night. As well as one episode of leg pain.  Objective: Vital signs in last 24 hours: Temp:  [98.3 F (36.8 C)-100 F (37.8 C)] 99 F (37.2 C) (07/12 0749) Pulse Rate:  [84-126] 104 (07/12 0700) Resp:  [7-25] 16 (07/12 0700) BP: (94-130)/(52-81) 96/60 (07/12 0700) SpO2:  [92 %-97 %] 94 % (07/12 0700)  Intake/Output from previous day: 07/11 0701 - 07/12 0700 In: 2892.7 [P.O.:350; I.V.:1791.7; IV Piggyback:751] Out: 5474 [Urine:5100; Drains:374] Intake/Output this shift: No intake/output data recorded.  Neurologic: Grossly normal, Aand O, MAEx4 with good strength, LD patent with clear CSF  Lab Results: Lab Results  Component Value Date   WBC 8.7 12/25/2017   HGB 10.8 (L) 12/25/2017   HCT 33.6 (L) 12/25/2017   MCV 92.3 12/25/2017   PLT 303 12/25/2017   No results found for: INR, PROTIME BMET Lab Results  Component Value Date   NA 141 12/27/2017   K 3.8 12/27/2017   CL 104 12/27/2017   CO2 30 12/27/2017   GLUCOSE 154 (H) 12/27/2017   BUN 7 12/27/2017   CREATININE 0.76 12/27/2017   CALCIUM 8.2 (L) 12/27/2017    Studies/Results: No results found.  Assessment/Plan: Continue flat bedrest, continue LD for at least 3 more days. I do wonder whether there is an element of pseudotumor cerebri here causing increased CSF pressure, especially given her morbid obesity and history of "migraines." continue vancomycin for drain prophylaxis  Estimated body mass index is 63.02 kg/m as calculated from the following:   Height as of this encounter: 5\' 2"  (1.575 m).   Weight as of this encounter: 156.3 kg (344 lb 9.3 oz).    LOS: 9 days    Sonya Gibson 12/29/2017, 8:34 AM

## 2017-12-30 MED ORDER — CHLORHEXIDINE GLUCONATE CLOTH 2 % EX PADS
6.0000 | MEDICATED_PAD | Freq: Every day | CUTANEOUS | Status: DC
  Administered 2017-12-30 – 2018-01-16 (×16): 6 via TOPICAL

## 2017-12-30 MED ORDER — SODIUM CHLORIDE 0.9% FLUSH
10.0000 mL | Freq: Two times a day (BID) | INTRAVENOUS | Status: DC
  Administered 2017-12-30 – 2018-01-08 (×8): 10 mL

## 2017-12-30 NOTE — Progress Notes (Signed)
Patient ID: Rusty AusHolly E Tietze, female   DOB: 08/24/1985, 32 y.o.   MRN: [id number] BP (!) 83/45   Pulse (!) 117   Temp 98.9 F (37.2 C) (Oral)   Resp 16   Ht 5\' 2"  (1.575 m)   Wt (!) 156.3 kg (344 lb 9.3 oz)   LMP 12/05/2017   SpO2 95%   BMI 63.02 kg/m  Alert and oriented x4 Have modified blood pressure medication parameters Headaches improved today

## 2017-12-30 NOTE — Plan of Care (Signed)
Patient was able to have multiple bowel movements yesterday after having several days with no bm's.  Patient needs to have foley remain in to keep incision clear of any infection according to Dr Yetta BarreJones.  Will continue to monitor.  Sonya PurpuraSusan Dessiree Sze RN

## 2017-12-31 LAB — BASIC METABOLIC PANEL
ANION GAP: 7 (ref 5–15)
BUN: 7 mg/dL (ref 6–20)
CHLORIDE: 103 mmol/L (ref 98–111)
CO2: 27 mmol/L (ref 22–32)
Calcium: 8.4 mg/dL — ABNORMAL LOW (ref 8.9–10.3)
Creatinine, Ser: 0.84 mg/dL (ref 0.44–1.00)
GFR calc Af Amer: >60 mL/min (ref >60)
GFR calc non Af Amer: >60 mL/min (ref >60)
GLUCOSE: 165 mg/dL — AB (ref 70–99)
POTASSIUM: 4 mmol/L (ref 3.5–5.1)
Sodium: 137 mmol/L (ref 135–145)

## 2017-12-31 NOTE — Progress Notes (Signed)
Pt continuously complaining of head and leg pain throughout the night even with all pain medication being given

## 2017-12-31 NOTE — Progress Notes (Signed)
Patient ID: Sonya Gibson, female   DOB: 12/31/1985, 32 y.o.   MRN: [id number] Lumbar drain in place, draining around the tube at the skin site No neuro changes

## 2017-12-31 NOTE — Progress Notes (Signed)
Called into the patient's room with her crying out in pain.  I reassessed the lumbar site and it was noted to be leaking quite a bit, but the drain itself is still dropping at a steady consistent rate.  I reinforced the dressing and will let the on call neurosurgeon know.

## 2018-01-01 ENCOUNTER — Inpatient Hospital Stay (HOSPITAL_COMMUNITY): Payer: BLUE CROSS/BLUE SHIELD

## 2018-01-01 DIAGNOSIS — R609 Edema, unspecified: Secondary | ICD-10-CM

## 2018-01-01 LAB — GRAM STAIN: Special Requests: NORMAL

## 2018-01-01 LAB — CBC
HEMATOCRIT: 33.1 % — AB (ref 36.0–46.0)
Hemoglobin: 10.3 g/dL — ABNORMAL LOW (ref 12.0–15.0)
MCH: 28.4 pg (ref 26.0–34.0)
MCHC: 31.1 g/dL (ref 30.0–36.0)
MCV: 91.2 fL (ref 78.0–100.0)
Platelets: 281 10*3/uL (ref 150–400)
RBC: 3.63 MIL/uL — AB (ref 3.87–5.11)
RDW: 14.5 % (ref 11.5–15.5)
WBC: 7.4 10*3/uL (ref 4.0–10.5)

## 2018-01-01 LAB — PROTEIN AND GLUCOSE, CSF
GLUCOSE CSF: 61 mg/dL (ref 40–70)
Total  Protein, CSF: 106 mg/dL — ABNORMAL HIGH (ref 15–45)

## 2018-01-01 MED ORDER — SODIUM CHLORIDE 0.9 % IV SOLN
2.0000 g | INTRAVENOUS | Status: DC
  Administered 2018-01-01 – 2018-01-14 (×14): 2 g via INTRAVENOUS
  Filled 2018-01-01 (×16): qty 20

## 2018-01-01 MED ORDER — KETOROLAC TROMETHAMINE 30 MG/ML IJ SOLN
30.0000 mg | Freq: Four times a day (QID) | INTRAMUSCULAR | Status: AC
  Administered 2018-01-01 – 2018-01-02 (×4): 30 mg via INTRAVENOUS
  Filled 2018-01-01 (×4): qty 1

## 2018-01-01 MED ORDER — HYDROMORPHONE HCL 2 MG PO TABS
4.0000 mg | ORAL_TABLET | ORAL | Status: DC | PRN
  Administered 2018-01-02 – 2018-01-09 (×18): 4 mg via ORAL
  Filled 2018-01-01 (×21): qty 2

## 2018-01-01 MED ORDER — ALTEPLASE 2 MG IJ SOLR
2.0000 mg | Freq: Once | INTRAMUSCULAR | Status: AC
  Administered 2018-01-01: 2 mg

## 2018-01-01 MED ORDER — LIDOCAINE HCL (PF) 1 % IJ SOLN
INTRAMUSCULAR | Status: AC
  Administered 2018-01-01: 5 mL
  Filled 2018-01-01: qty 5

## 2018-01-01 MED ORDER — LIDOCAINE HCL (PF) 1 % IJ SOLN: 5.0000 mL | Freq: Once | INTRAMUSCULAR | Status: AC

## 2018-01-01 MED ORDER — HYDROMORPHONE HCL 1 MG/ML IJ SOLN
1.0000 mg | INTRAMUSCULAR | Status: DC | PRN
  Administered 2018-01-01 – 2018-01-10 (×47): 1 mg via INTRAVENOUS
  Filled 2018-01-01 (×50): qty 1

## 2018-01-01 NOTE — Progress Notes (Signed)
Pharmacy Antibiotic Note  Rusty AusHolly E Vides is a 32 y.o. female admitted on 12/20/2017 with possible wound infection.  Pharmacy has been consulted for vancomycin dosing. Patient had lumbar microdiscectomy 12/05/17 and presented 12/20/17 with a severe headache and leaking clear fluid from wound. Wound is now red which is believed to be a skin reaction to the dressing. Currently on Vancomycin day 8.  Plan: Continue vancomycin at 1250 mg IV q8 hours F/u renal function, cultures, clinical course and LOT  Height: 5\' 2"  (157.5 cm) Weight: (!) 344 lb 9.3 oz (156.3 kg) IBW/kg (Calculated) : 50.1  Temp (24hrs), Avg:98.3 F (36.8 C), Min:97.8 F (36.6 C), Max:98.8 F (37.1 C)  Recent Labs  Lab 12/27/17 0500 12/27/17 0530 12/29/17 1559 12/31/17 0540 01/01/18 0941  WBC  --   --   --   --  7.4  CREATININE 0.76  --   --  0.84  --   VANCOTROUGH  --  20 17  --   --     Estimated Creatinine Clearance: 140.6 mL/min (by C-G formula based on SCr of 0.84 mg/dL).  nCrCl: 13300ml/min  Allergies  Allergen Reactions  . Cinnamon Anaphylaxis    (NO "REAL"/TREE BARK CINNAMON)  . Coconut Oil Anaphylaxis    Can eat "fake" coconut  . Lovenox [Enoxaparin] Other (See Comments)    BROKE DOWN THE SKIN AT INJECTION SITE AND CAUSED A WOUND  . Nitrofurantoin Hives    Antimicrobials this admission: 7/8 Vancomycin >>   Dose adjustments this admission: 7/10:  Dose decreased from 1500 q8 to 1250 q8  Thank you for allowing pharmacy to be a part of this patient's care.  Jeanella CaraCathy Eilyn Polack, Pharm D, [place] 01/01/2018      10:54 AM

## 2018-01-01 NOTE — Progress Notes (Signed)
Preliminary notes--Bilateral lower extremities venous duplex exam completed. 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 Very limited study due to patient combativeness and body habitus.  Hongying Richardson DoppCole (RDMS RVT) 01/01/18 12:56 PM

## 2018-01-01 NOTE — Progress Notes (Signed)
Notes from this weekend read. Apparently at some point on Saturday she was only draining 2-3 mL/h and then she started to have headache and leakage from her incision. The lumbar drain is working well at present but the spinal fluid has become somewhat less clear. Her incision looks clean but she is certainly draining pink fluid and the sutures have loosened. I removed the sutures and injected local anesthetic and stapled her wound. There is no drainage at present. I do not see that she is draining around the lumbar drain. She has good strength in her legs but she is complaining of a lot of leg pain especially in the calves.  Extremely difficult situation. While the drainage from her back may or may not be CSF, it is certainly concerning. But why would she continued to leak with a working lumbar drain? Unless she has some type of high CSF pressure situation such as pseudotumor cerebri.  1. CSF sent for studies. She is on vancomycin prophylactically 2. We will do duplex Dopplers to rule out DVT as she has not been overly compliant with the SCDs. Would like to avoid Lovenox while lumbar drain is in place at all possible so we have not used that to this point 3. Need CT scan of the head, I suspect she will have slit ventricles but this will rule out hydrocephalus or subdural hygroma though it cannot rule out pseudotumor 4. Will need a CT scan of the lumbar spine with intrathecal contrast can be injected through the lumbar drain with delayed imaging to rule out CSF leak. If she continues to leak spinal fluid, what is the next step? Any permanent CSF diversion such as LP shunt or VP shunt would be extremely difficult given her body habitus. 5. Continue pain control 6. More aggressive lumbar drainage

## 2018-01-01 NOTE — Progress Notes (Signed)
R arm SL PICC occluded. tPA instilled. Pt and RN request peripheral IV for pain management while PICC is declotting. Attempted x1. Requested assessment with ultrasound. Thomasene LotSuzy, RN is asking if PICC can be exchanged for double lumen. Will address w PICC RN.

## 2018-01-02 ENCOUNTER — Inpatient Hospital Stay: Payer: Self-pay

## 2018-01-02 LAB — CSF CELL COUNT WITH DIFFERENTIAL
Eosinophils, CSF: 0 % (ref 0–1)
Lymphs, CSF: 23 % — ABNORMAL LOW (ref 40–80)
Monocyte-Macrophage-Spinal Fluid: 6 % — ABNORMAL LOW (ref 15–45)
RBC COUNT CSF: 64 /mm3 — AB
SEGMENTED NEUTROPHILS-CSF: 71 % — AB (ref 0–6)
WBC CSF: 3137 /mm3 — AB (ref 0–5)

## 2018-01-02 MED ORDER — SODIUM CHLORIDE 0.9% FLUSH
10.0000 mL | INTRAVENOUS | Status: DC | PRN
  Administered 2018-01-03: 10 mL
  Filled 2018-01-02: qty 40

## 2018-01-02 MED ORDER — ALTEPLASE 2 MG IJ SOLR: 2.0000 mg | Freq: Once | INTRAMUSCULAR | Status: DC

## 2018-01-02 MED ORDER — CHLORHEXIDINE GLUCONATE CLOTH 2 % EX PADS: 6.0000 | MEDICATED_PAD | Freq: Every day | CUTANEOUS | Status: DC

## 2018-01-02 MED ORDER — SODIUM CHLORIDE 0.9% FLUSH
10.0000 mL | Freq: Two times a day (BID) | INTRAVENOUS | Status: DC
  Administered 2018-01-02: 20 mL
  Administered 2018-01-03 – 2018-01-07 (×7): 10 mL

## 2018-01-02 MED ORDER — KETOROLAC TROMETHAMINE 30 MG/ML IJ SOLN
30.0000 mg | Freq: Four times a day (QID) | INTRAMUSCULAR | Status: AC
  Administered 2018-01-02 – 2018-01-03 (×5): 30 mg via INTRAVENOUS
  Filled 2018-01-02 (×5): qty 1

## 2018-01-02 NOTE — Progress Notes (Signed)
Staying dry, LD patent and csf is clearer. C/o headache and leg pain. On vanc and rocephin empirically. Moves legs well, U/S neg for DVT. CT head reviewed. Will plan myelogram tomorrow.

## 2018-01-02 NOTE — Progress Notes (Signed)
Peripherally Inserted Central Catheter/Midline Placement  The IV Nurse has discussed with the patient and/or persons authorized to consent for the patient, the purpose of this procedure and the potential benefits and risks involved with this procedure.  The benefits include less needle sticks, lab draws from the catheter, and the patient may be discharged home with the catheter. Risks include, but not limited to, infection, bleeding, blood clot (thrombus formation), and puncture of an artery; nerve damage and irregular heartbeat and possibility to perform a PICC exchange if needed/ordered by physician.  Alternatives to this procedure were also discussed.  Bard Power PICC patient education guide, fact sheet on infection prevention and patient information card has been provided to patient /or left at bedside.    PICC/Midline Placement Documentation  PICC Double Lumen 01/02/18 PICC Left Cephalic 45 cm 1 cm (Active)  Indication for Insertion or Continuance of Line Poor Vasculature-patient has had multiple peripheral attempts or PIVs lasting less than 24 hours 01/02/2018  1:00 PM  Exposed Catheter (cm) 1 cm 01/02/2018  1:00 PM  Site Assessment Clean;Dry;Intact 01/02/2018  1:00 PM  Lumen #1 Status Flushed;Saline locked;Blood return noted 01/02/2018  1:00 PM  Lumen #2 Status Flushed;Saline locked;Blood return noted 01/02/2018  1:00 PM  Dressing Type Transparent;Securing device 01/02/2018  1:00 PM  Dressing Status Clean;Dry;Intact;Antimicrobial disc in place 01/02/2018  1:00 PM  Dressing Change Due 01/09/18 01/02/2018  1:00 PM       Romie Jumperlford, Magdalene Tardiff Terry 01/02/2018, 1:23 PM

## 2018-01-02 NOTE — Progress Notes (Signed)
IV Team: Spoke with RN about PICC placement on patient preferably L arm. R PICC occluded, TPA'd yesterday but unsuccessful. RN stated patient lays on her right side often. RN will placed order for PICC. Will monitor.   Annett Fabianosalyn Kamir Selover RN [facility] [facility] Team

## 2018-01-02 NOTE — Progress Notes (Signed)
CRITICAL VALUE ALERT  Critical Value:  WBC from CSF 3137  Date & Time Notied:  01/01/18 1100  Provider Notified: Leo GrosserKim Meyran NP  Orders Received/Actions taken: Antibiotics ordered

## 2018-01-03 ENCOUNTER — Inpatient Hospital Stay (HOSPITAL_COMMUNITY): Payer: BLUE CROSS/BLUE SHIELD

## 2018-01-03 LAB — BASIC METABOLIC PANEL
Anion gap: 8 (ref 5–15)
BUN: 6 mg/dL (ref 6–20)
CO2: 27 mmol/L (ref 22–32)
Calcium: 8.5 mg/dL — ABNORMAL LOW (ref 8.9–10.3)
Chloride: 104 mmol/L (ref 98–111)
Creatinine, Ser: 0.76 mg/dL (ref 0.44–1.00)
GFR calc Af Amer: >60 mL/min (ref >60)
GFR calc non Af Amer: >60 mL/min (ref >60)
Glucose, Bld: 157 mg/dL — ABNORMAL HIGH (ref 70–99)
POTASSIUM: 3.6 mmol/L (ref 3.5–5.1)
SODIUM: 139 mmol/L (ref 135–145)

## 2018-01-03 LAB — CSF CELL COUNT WITH DIFFERENTIAL
Lymphs, CSF: 61 % (ref 40–80)
Monocyte-Macrophage-Spinal Fluid: 5 % — ABNORMAL LOW (ref 15–45)
RBC Count, CSF: 18 /mm3 — ABNORMAL HIGH
Segmented Neutrophils-CSF: 34 % — ABNORMAL HIGH (ref 0–6)
WBC, CSF: 300 /mm3 (ref 0–5)

## 2018-01-03 LAB — PROTEIN AND GLUCOSE, CSF
GLUCOSE CSF: 60 mg/dL (ref 40–70)
TOTAL PROTEIN, CSF: 67 mg/dL — AB (ref 15–45)

## 2018-01-03 LAB — PATHOLOGIST SMEAR REVIEW: Path Review: INCREASED

## 2018-01-03 MED ORDER — BUTALBITAL-APAP-CAFFEINE 50-325-40 MG PO TABS
2.0000 | ORAL_TABLET | Freq: Four times a day (QID) | ORAL | Status: DC | PRN
  Administered 2018-01-03 – 2018-01-14 (×20): 2 via ORAL
  Filled 2018-01-03 (×20): qty 2

## 2018-01-03 NOTE — Progress Notes (Signed)
Wound remains clean and dry without signs of infection, LD patent. C/o headache and leg pain. No NTW, moves legs well. Continue LD for 24 more hrs and consider pulling if wound looks good, CSF studies sent

## 2018-01-04 ENCOUNTER — Inpatient Hospital Stay (HOSPITAL_COMMUNITY): Payer: BLUE CROSS/BLUE SHIELD

## 2018-01-04 LAB — VANCOMYCIN, TROUGH: Vancomycin Tr: 24 ug/mL (ref 15–20)

## 2018-01-04 LAB — PATHOLOGIST SMEAR REVIEW

## 2018-01-04 MED ORDER — SODIUM CHLORIDE 0.9 % IV SOLN
INTRAVENOUS | Status: DC
  Administered 2018-01-06: 12:00:00 via INTRAVENOUS

## 2018-01-04 MED ORDER — PROMETHAZINE HCL 25 MG/ML IJ SOLN
12.5000 mg | Freq: Four times a day (QID) | INTRAMUSCULAR | Status: DC | PRN
  Administered 2018-01-04 – 2018-01-14 (×23): 25 mg via INTRAVENOUS
  Administered 2018-01-15: 12.5 mg via INTRAVENOUS
  Filled 2018-01-04 (×24): qty 1

## 2018-01-04 MED ORDER — VANCOMYCIN HCL 10 G IV SOLR
1250.0000 mg | Freq: Two times a day (BID) | INTRAVENOUS | Status: DC
  Administered 2018-01-05 – 2018-01-07 (×5): 1250 mg via INTRAVENOUS
  Filled 2018-01-04 (×6): qty 1250

## 2018-01-04 MED ORDER — IOPAMIDOL (ISOVUE-M 200) INJECTION 41%
20.0000 mL | Freq: Once | INTRAMUSCULAR | Status: AC
  Administered 2018-01-04: 12 mL via INTRATHECAL

## 2018-01-04 MED ORDER — LIDOCAINE HCL (PF) 1 % IJ SOLN
5.0000 mL | Freq: Once | INTRAMUSCULAR | Status: AC
  Administered 2018-01-04: 5 mL via INTRADERMAL

## 2018-01-04 NOTE — Progress Notes (Signed)
Myelogram does not show evidence of CSF leak. I have spoken personally to the radiologist. I have removed her lumbar drain. At present her wound is clean dry and intact. Headache is somewhat better. Left leg pain is gone.

## 2018-01-04 NOTE — Progress Notes (Signed)
Pharmacy Antibiotic Note  Sonya Gibson is a 32 y.o. female admitted on 12/20/2017 with possible wound infection.  Pharmacy has been consulted for vancomycin dosing. Patient had lumbar microdiscectomy 12/05/17 and presented 12/20/17 with a severe headache and leaking clear fluid from wound. Wound is now red which is believed to be a skin reaction to the dressing. Currently on Vancomycin day   Vancomycin trough elevated at 24 on 1250 mg IV every 8 hours. Good UOP. SCr is stable. Drain removed today after myelogram did not show CSF leak. Wound is clean and dry.   Plan: Reduce vancomycin to 1250 mg IV every 12 hours as accumulating on q8h dosing.  Now that drain removed, consider monitoring off antibiotics.  F/u renal function, cultures, clinical course and LOT  Height: 5\' 2"  (157.5 cm) Weight: (!) 344 lb 9.3 oz (156.3 kg) IBW/kg (Calculated) : 50.1  Temp (24hrs), Avg:98.1 F (36.7 C), Min:97.7 F (36.5 C), Max:99 F (37.2 C)  Recent Labs  Lab 12/29/17 1559 12/31/17 0540 01/01/18 0941 01/03/18 0520 01/04/18 1446  WBC  --   --  7.4  --   --   CREATININE  --  0.84  --  0.76  --   VANCOTROUGH 17  --   --   --  24*    Estimated Creatinine Clearance: 147.6 mL/min (by C-G formula based on SCr of 0.76 mg/dL).  nCrCl: 12300ml/min  Allergies  Allergen Reactions  . Cinnamon Anaphylaxis    (NO "REAL"/TREE BARK CINNAMON)  . Coconut Oil Anaphylaxis    Can eat "fake" coconut  . Lovenox [Enoxaparin] Other (See Comments)    BROKE DOWN THE SKIN AT INJECTION SITE AND CAUSED A WOUND  . Nitrofurantoin Hives    Antimicrobials this admission: 7/8 Vancomycin >>   Dose adjustments this admission: 7/10:  Dose decreased from 1500 q8 to 1250 q8  Thank you for allowing pharmacy to be a part of this patient's care.  Link SnufferJessica Coye Dawood, PharmD, BCPS, BCCCP Clinical Pharmacist Clinical phone 01/04/2018 until 10PM [phone]- #[phone] Please refer to [facility] for clinical pharmacist numbers 01/04/2018       4:08 PM

## 2018-01-04 NOTE — Progress Notes (Signed)
Subjective: Patient reports some headache and neck pain  Objective: Vital signs in last 24 hours: Temp:  [97.7 F (36.5 C)-98 F (36.7 C)] 97.7 F (36.5 C) (07/18 0400) Pulse Rate:  [88-129] 101 (07/18 0700) Resp:  [9-17] 17 (07/18 0700) BP: (115-184)/(64-165) 145/78 (07/18 0700) SpO2:  [90 %-100 %] 97 % (07/18 0700)  Intake/Output from previous day: 07/17 0701 - 07/18 0700 In: 4014 [P.O.:120; I.V.:1824.3; IV Piggyback:2069.7] Out: 4713 [Urine:4400; Drains:313] Intake/Output this shift: No intake/output data recorded.  Neurologic: Grossly normal, incision with minimal drainage that looks most like serosanguinous drainage (brownish red), no signs of infection such as redness or induration  Lab Results: Lab Results  Component Value Date   WBC 7.4 01/01/2018   HGB 10.3 (L) 01/01/2018   HCT 33.1 (L) 01/01/2018   MCV 91.2 01/01/2018   PLT 281 01/01/2018   No results found for: INR, PROTIME BMET Lab Results  Component Value Date   NA 139 01/03/2018   K 3.6 01/03/2018   CL 104 01/03/2018   CO2 27 01/03/2018   GLUCOSE 157 (H) 01/03/2018   BUN 6 01/03/2018   CREATININE 0.76 01/03/2018   CALCIUM 8.5 (L) 01/03/2018    Studies/Results: [place] Ekg Site Rite  Result Date: 01/02/2018 If Site Rite image not attached, placement could not be confirmed due to current cardiac rhythm.   Assessment/Plan: CT/myelo today to look for healing of leak, CSF looks much better, continue empiric abx  Estimated body mass index is 63.02 kg/m as calculated from the following:   Height as of this encounter: 5\' 2"  (1.575 m).   Weight as of this encounter: 156.3 kg (344 lb 9.3 oz).    LOS: 15 days    Haili Donofrio S 01/04/2018, 7:36 AM

## 2018-01-05 ENCOUNTER — Other Ambulatory Visit: Payer: Self-pay | Admitting: Neurological Surgery

## 2018-01-05 LAB — BASIC METABOLIC PANEL
ANION GAP: 11 (ref 5–15)
BUN: 12 mg/dL (ref 6–20)
CO2: 27 mmol/L (ref 22–32)
Calcium: 9.3 mg/dL (ref 8.9–10.3)
Chloride: 100 mmol/L (ref 98–111)
Creatinine, Ser: 0.92 mg/dL (ref 0.44–1.00)
GFR calc non Af Amer: >60 mL/min (ref >60)
GLUCOSE: 162 mg/dL — AB (ref 70–99)
Potassium: 3.7 mmol/L (ref 3.5–5.1)
Sodium: 138 mmol/L (ref 135–145)

## 2018-01-05 NOTE — Progress Notes (Signed)
PT Cancellation Note  Patient Details Name: Rusty AusHolly E Farnan MRN: [id number] DOB: 12/02/1985   Cancelled Treatment:    Reason Eval/Treat Not Completed: Medical issues which prohibited therapy(pt with return of HA and leak. Will hold and await medical clearance)   Amberlin Utke B Gilmar Bua 01/05/2018, 8:40 AM  Delaney MeigsMaija Tabor Beautifull Cisar, PT [phone]

## 2018-01-05 NOTE — Progress Notes (Signed)
Pt continues to c/o headache and leg pain, and wound is draining more. On empiric abx. Moves legs well. I'm unsure what the next best step is. Over sew wound? Re-exploration? Permanent shunting?

## 2018-01-06 LAB — CSF CULTURE: CULTURE: NO GROWTH

## 2018-01-06 NOTE — Progress Notes (Signed)
PT Cancellation Note  Patient Details Name: Rusty AusHolly E Laurich MRN: [id number] DOB: 04/18/1986   Cancelled Treatment:    Reason Eval/Treat Not Completed: Patient not medically ready, pt to return to OR Monday, continued bedrest until that time. Please reorder when appropriate after surgery.    Kylani Wires L Deshawn Skelley 01/06/2018, 2:13 PM

## 2018-01-06 NOTE — Progress Notes (Signed)
No new issues or problems overnight.  Patient complains of back pain.  Still having significant CSF leakage from the wound.  Currently afebrile.  One brief temperature elevation earlier yesterday.  Patient is awake and alert.  She does not appear toxic.  Her neck is mildly stiff but not severely so.  Motor and sensory exam stable.  She is sitting upright currently.  Her wound is mildly erythemic.  There is no current evidence of drainage however her bed sheets were saturated earlier.  Postoperative CSF leak with difficulty healing.  Plan for continued bedrest and placement of lumbar peritoneal shunt on Monday per Dr. Yetta BarreJones.

## 2018-01-07 LAB — BASIC METABOLIC PANEL
ANION GAP: 7 (ref 5–15)
BUN: 6 mg/dL (ref 6–20)
CALCIUM: 8.3 mg/dL — AB (ref 8.9–10.3)
CO2: 28 mmol/L (ref 22–32)
Chloride: 105 mmol/L (ref 98–111)
Creatinine, Ser: 0.75 mg/dL (ref 0.44–1.00)
GLUCOSE: 180 mg/dL — AB (ref 70–99)
POTASSIUM: 3.7 mmol/L (ref 3.5–5.1)
Sodium: 140 mmol/L (ref 135–145)

## 2018-01-07 LAB — VANCOMYCIN, TROUGH: VANCOMYCIN TR: 10 ug/mL — AB (ref 15–20)

## 2018-01-07 MED ORDER — VANCOMYCIN HCL 10 G IV SOLR
1750.0000 mg | Freq: Two times a day (BID) | INTRAVENOUS | Status: DC
  Administered 2018-01-08 – 2018-01-14 (×14): 1750 mg via INTRAVENOUS
  Filled 2018-01-07 (×18): qty 1750

## 2018-01-07 NOTE — Progress Notes (Signed)
Patient complains of headache and back pain.  No new lower extremity symptoms.  She has numerous questions with regard to her surgery tomorrow.  She is afebrile.  Her vital signs are stable.  She is oriented and appropriate.  Motor and sensory function are intact.  Her wound has some clear drainage.She is awake and alert.  There is no evidence of any purulence or significant erythema.  Persistent postoperative CSF leak following lumbar surgery.  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  Dr. Yetta BarreJones is going to move forward with lumbar peritoneal shunting tomorrow.

## 2018-01-07 NOTE — Anesthesia Preprocedure Evaluation (Signed)
Anesthesia Evaluation  Patient identified by MRN, date of birth, ID band Patient awake    Reviewed: Allergy & Precautions, NPO status , Patient's Chart, lab work & pertinent test results  Airway Mallampati: III  TM Distance: >3 FB Neck ROM: Full    Dental  (+) Teeth Intact, Dental Advisory Given   Pulmonary    breath sounds clear to auscultation       Cardiovascular hypertension,  Rhythm:Regular Rate:Normal     Neuro/Psych    GI/Hepatic GERD  ,  Endo/Other  Hypothyroidism Morbid obesity  Renal/GU      Musculoskeletal   Abdominal (+) + obese,   Peds  Hematology   Anesthesia Other Findings   Reproductive/Obstetrics                             Anesthesia Physical  Anesthesia Plan  ASA: III  Anesthesia Plan: General   Post-op Pain Management:    Induction: Intravenous  PONV Risk Score and Plan: 4 or greater and Ondansetron, Dexamethasone, Midazolam and Treatment may vary due to age or medical condition  Airway Management Planned: Oral ETT  Additional Equipment:   Intra-op Plan:   Post-operative Plan: Extubation in OR  Informed Consent: I have reviewed the patients History and Physical, chart, labs and discussed the procedure including the risks, benefits and alternatives for the proposed anesthesia with the patient or authorized representative who has indicated his/her understanding and acceptance.   Dental advisory given  Plan Discussed with: CRNA  Anesthesia Plan Comments:         Anesthesia Quick Evaluation

## 2018-01-07 NOTE — Progress Notes (Signed)
Pharmacy Antibiotic Note  Sonya Gibson is a 32 y.o. female admitted on 12/20/2017 with possible wound infection.  Pharmacy has been consulted for vancomycin dosing. Patient had lumbar microdiscectomy 12/05/17 and presented 12/20/17 with a severe headache and leaking clear fluid from wound. Wound is now red which is believed to be a skin reaction to the dressing. Currently on Vancomycin day   Vancomycin trough low at 10  Plan: Increase vanc 1750 q12  Height: 5\' 2"  (157.5 cm) Weight: (!) 344 lb 9.3 oz (156.3 kg) IBW/kg (Calculated) : 50.1  Temp (24hrs), Avg:98 F (36.7 C), Min:97.5 F (36.4 C), Max:98.5 F (36.9 C)  Recent Labs  Lab 01/01/18 0941 01/03/18 0520 01/04/18 1446 01/05/18 0500 01/07/18 0521 01/07/18 1455  WBC 7.4  --   --   --   --   --   CREATININE  --  0.76  --  0.92 0.75  --   VANCOTROUGH  --   --  24*  --   --  10*    Estimated Creatinine Clearance: 147.6 mL/min (by C-G formula based on SCr of 0.75 mg/dL).  nCrCl: 13700ml/min  Allergies  Allergen Reactions  . Cinnamon Anaphylaxis    (NO "REAL"/TREE BARK CINNAMON)  . Coconut Oil Anaphylaxis    Can eat "fake" coconut  . Lovenox [Enoxaparin] Other (See Comments)    BROKE DOWN THE SKIN AT INJECTION SITE AND CAUSED A WOUND  . Nitrofurantoin Hives   Sonya Gibson, PharmD, BCPS, BCCCP Clinical Pharmacist [phone]  Please check AMION for all [facility] numbers  01/07/2018 3:54 PM

## 2018-01-08 ENCOUNTER — Inpatient Hospital Stay (HOSPITAL_COMMUNITY): Payer: BLUE CROSS/BLUE SHIELD | Admitting: Anesthesiology

## 2018-01-08 ENCOUNTER — Encounter (HOSPITAL_COMMUNITY): Payer: Self-pay | Admitting: Certified Registered"

## 2018-01-08 ENCOUNTER — Encounter (HOSPITAL_COMMUNITY)
Admission: EM | Disposition: A | Discharge status: Home Health | Payer: Self-pay | Source: Home / Self Care | Attending: Neurological Surgery

## 2018-01-08 ENCOUNTER — Inpatient Hospital Stay (HOSPITAL_COMMUNITY): Payer: BLUE CROSS/BLUE SHIELD

## 2018-01-08 HISTORY — PX: VENTRICULOPERITONEAL SHUNT: SHX204

## 2018-01-08 SURGERY — SHUNT INSERTION VENTRICULAR-PERITONEAL
Anesthesia: General | Site: Spine Lumbar

## 2018-01-08 MED ORDER — BACITRACIN ZINC 500 UNIT/GM EX OINT
TOPICAL_OINTMENT | CUTANEOUS | Status: AC
  Filled 2018-01-08: qty 28.35

## 2018-01-08 MED ORDER — ROCURONIUM BROMIDE 10 MG/ML (PF) SYRINGE
PREFILLED_SYRINGE | INTRAVENOUS | Status: AC
  Filled 2018-01-08: qty 10

## 2018-01-08 MED ORDER — LIDOCAINE 2% (20 MG/ML) 5 ML SYRINGE
INTRAMUSCULAR | Status: AC
  Filled 2018-01-08: qty 5

## 2018-01-08 MED ORDER — HEMOSTATIC AGENTS (NO CHARGE) OPTIME
TOPICAL | Status: DC | PRN
  Administered 2018-01-08 (×5): 1

## 2018-01-08 MED ORDER — ALBUTEROL SULFATE HFA 108 (90 BASE) MCG/ACT IN AERS
INHALATION_SPRAY | RESPIRATORY_TRACT | Status: DC | PRN
  Administered 2018-01-08: 2 via RESPIRATORY_TRACT

## 2018-01-08 MED ORDER — SODIUM CHLORIDE 0.9% FLUSH
3.0000 mL | Freq: Two times a day (BID) | INTRAVENOUS | Status: DC
  Administered 2018-01-08 – 2018-01-15 (×10): 3 mL via INTRAVENOUS

## 2018-01-08 MED ORDER — SUGAMMADEX SODIUM 200 MG/2ML IV SOLN
INTRAVENOUS | Status: DC | PRN
  Administered 2018-01-08: 315 mg via INTRAVENOUS

## 2018-01-08 MED ORDER — MEPERIDINE HCL 50 MG/ML IJ SOLN: 6.2500 mg | INTRAMUSCULAR | Status: DC | PRN

## 2018-01-08 MED ORDER — SCOPOLAMINE 1 MG/3DAYS TD PT72
MEDICATED_PATCH | TRANSDERMAL | Status: DC | PRN
  Administered 2018-01-08: 1 via TRANSDERMAL

## 2018-01-08 MED ORDER — LIDOCAINE-EPINEPHRINE 1 %-1:100000 IJ SOLN
INTRAMUSCULAR | Status: AC
  Filled 2018-01-08: qty 1

## 2018-01-08 MED ORDER — FENTANYL CITRATE (PF) 250 MCG/5ML IJ SOLN
INTRAMUSCULAR | Status: AC
  Filled 2018-01-08: qty 5

## 2018-01-08 MED ORDER — THROMBIN 5000 UNITS EX SOLR
CUTANEOUS | Status: AC
  Filled 2018-01-08: qty 10000

## 2018-01-08 MED ORDER — CHLORHEXIDINE GLUCONATE CLOTH 2 % EX PADS
6.0000 | MEDICATED_PAD | Freq: Once | CUTANEOUS | Status: AC
  Administered 2018-01-08: 6 via TOPICAL

## 2018-01-08 MED ORDER — SUCCINYLCHOLINE CHLORIDE 200 MG/10ML IV SOSY
PREFILLED_SYRINGE | INTRAVENOUS | Status: DC | PRN
  Administered 2018-01-08: 120 mg via INTRAVENOUS

## 2018-01-08 MED ORDER — LACTATED RINGERS IV SOLN
INTRAVENOUS | Status: DC | PRN
  Administered 2018-01-08: 16:00:00 via INTRAVENOUS

## 2018-01-08 MED ORDER — FENTANYL CITRATE (PF) 100 MCG/2ML IJ SOLN
INTRAMUSCULAR | Status: AC
Start: 2018-01-08 — End: 2018-01-08
  Administered 2018-01-08: 50 ug via INTRAVENOUS
  Filled 2018-01-08: qty 2

## 2018-01-08 MED ORDER — DIAZEPAM 5 MG/ML IJ SOLN
2.5000 mg | Freq: Once | INTRAMUSCULAR | Status: AC
  Administered 2018-01-08: 2.5 mg via INTRAVENOUS

## 2018-01-08 MED ORDER — MIDAZOLAM HCL 2 MG/2ML IJ SOLN
INTRAMUSCULAR | Status: AC
  Filled 2018-01-08: qty 2

## 2018-01-08 MED ORDER — SUCCINYLCHOLINE CHLORIDE 200 MG/10ML IV SOSY
PREFILLED_SYRINGE | INTRAVENOUS | Status: AC
  Filled 2018-01-08: qty 10

## 2018-01-08 MED ORDER — ROCURONIUM BROMIDE 10 MG/ML (PF) SYRINGE
PREFILLED_SYRINGE | INTRAVENOUS | Status: DC | PRN
  Administered 2018-01-08 (×3): 10 mg via INTRAVENOUS
  Administered 2018-01-08: 50 mg via INTRAVENOUS

## 2018-01-08 MED ORDER — OXYCODONE HCL 5 MG/5ML PO SOLN: 5.0000 mg | Freq: Once | ORAL | Status: DC | PRN

## 2018-01-08 MED ORDER — SODIUM CHLORIDE 0.9 % IV SOLN
250.0000 mL | INTRAVENOUS | Status: DC
Start: 2018-01-08 — End: 2018-01-16

## 2018-01-08 MED ORDER — BACITRACIN ZINC 500 UNIT/GM EX OINT: TOPICAL_OINTMENT | CUTANEOUS | Status: DC | PRN

## 2018-01-08 MED ORDER — ONDANSETRON HCL 4 MG/2ML IJ SOLN
INTRAMUSCULAR | Status: DC | PRN
  Administered 2018-01-08: 4 mg via INTRAVENOUS

## 2018-01-08 MED ORDER — LACTATED RINGERS IV SOLN
INTRAVENOUS | Status: DC
  Administered 2018-01-08: 15:00:00 via INTRAVENOUS

## 2018-01-08 MED ORDER — ACETAMINOPHEN 650 MG RE SUPP: 650.0000 mg | RECTAL | Status: DC | PRN

## 2018-01-08 MED ORDER — PROMETHAZINE HCL 25 MG/ML IJ SOLN: 6.2500 mg | INTRAMUSCULAR | Status: DC | PRN

## 2018-01-08 MED ORDER — PROPOFOL 10 MG/ML IV BOLUS
INTRAVENOUS | Status: AC
  Filled 2018-01-08: qty 20

## 2018-01-08 MED ORDER — OXYCODONE HCL 5 MG PO TABS: 5.0000 mg | ORAL_TABLET | Freq: Once | ORAL | Status: DC | PRN

## 2018-01-08 MED ORDER — LIDOCAINE-EPINEPHRINE 1 %-1:100000 IJ SOLN
INTRAMUSCULAR | Status: DC | PRN
  Administered 2018-01-08: 20 mL

## 2018-01-08 MED ORDER — 0.9 % SODIUM CHLORIDE (POUR BTL) OPTIME
TOPICAL | Status: DC | PRN
  Administered 2018-01-08: 1000 mL

## 2018-01-08 MED ORDER — POTASSIUM CHLORIDE IN NACL 20-0.9 MEQ/L-% IV SOLN
INTRAVENOUS | Status: DC
  Administered 2018-01-08 – 2018-01-10 (×3): via INTRAVENOUS
  Filled 2018-01-08 (×6): qty 1000

## 2018-01-08 MED ORDER — DIAZEPAM 5 MG/ML IJ SOLN
INTRAMUSCULAR | Status: AC
  Administered 2018-01-08: 2.5 mg via INTRAVENOUS
  Filled 2018-01-08: qty 2

## 2018-01-08 MED ORDER — ACETAMINOPHEN 325 MG PO TABS: 650.0000 mg | ORAL_TABLET | ORAL | Status: DC | PRN

## 2018-01-08 MED ORDER — LIDOCAINE 2% (20 MG/ML) 5 ML SYRINGE
INTRAMUSCULAR | Status: DC | PRN
  Administered 2018-01-08: 100 mg via INTRAVENOUS

## 2018-01-08 MED ORDER — FENTANYL CITRATE (PF) 100 MCG/2ML IJ SOLN
25.0000 ug | INTRAMUSCULAR | Status: DC | PRN
  Administered 2018-01-08: 50 ug via INTRAVENOUS

## 2018-01-08 MED ORDER — MIDAZOLAM HCL 5 MG/5ML IJ SOLN
INTRAMUSCULAR | Status: DC | PRN
  Administered 2018-01-08 (×2): 1 mg via INTRAVENOUS

## 2018-01-08 MED ORDER — FENTANYL CITRATE (PF) 100 MCG/2ML IJ SOLN
INTRAMUSCULAR | Status: DC | PRN
  Administered 2018-01-08 (×3): 50 ug via INTRAVENOUS
  Administered 2018-01-08: 25 ug via INTRAVENOUS
  Administered 2018-01-08 (×3): 50 ug via INTRAVENOUS
  Administered 2018-01-08: 25 ug via INTRAVENOUS
  Administered 2018-01-08 (×3): 50 ug via INTRAVENOUS

## 2018-01-08 MED ORDER — VANCOMYCIN HCL 1000 MG IV SOLR
INTRAVENOUS | Status: DC | PRN
  Administered 2018-01-08: 1000 mg

## 2018-01-08 MED ORDER — PROPOFOL 10 MG/ML IV BOLUS
INTRAVENOUS | Status: DC | PRN
  Administered 2018-01-08 (×2): 10 mg via INTRAVENOUS
  Administered 2018-01-08: 200 mg via INTRAVENOUS
  Administered 2018-01-08 (×4): 10 mg via INTRAVENOUS

## 2018-01-08 MED ORDER — SODIUM CHLORIDE 0.9% FLUSH
3.0000 mL | INTRAVENOUS | Status: DC | PRN
  Administered 2018-01-15: 3 mL via INTRAVENOUS
  Filled 2018-01-08: qty 3

## 2018-01-08 MED ORDER — VANCOMYCIN HCL 1000 MG IV SOLR
INTRAVENOUS | Status: AC
  Filled 2018-01-08: qty 1000

## 2018-01-08 SURGICAL SUPPLY — 80 items
ADH SKN CLS APL DERMABOND .7 (GAUZE/BANDAGES/DRESSINGS)
APL SKNCLS STERI-STRIP NONHPOA (GAUZE/BANDAGES/DRESSINGS) ×2
BAG DECANTER FOR FLEXI CONT (MISCELLANEOUS) ×3 IMPLANT
BENZOIN TINCTURE PRP APPL 2/3 (GAUZE/BANDAGES/DRESSINGS) ×6 IMPLANT
BLADE CLIPPER SPEC (BLADE) ×3 IMPLANT
BUR ACORN 6.0 PRECISION (BURR) ×2 IMPLANT
BUR ACORN 6.0MM PRECISION (BURR) ×1
CANISTER SUCT 3000ML PPV (MISCELLANEOUS) ×3 IMPLANT
CARTRIDGE OIL MAESTRO DRILL (MISCELLANEOUS) ×1 IMPLANT
CATH LUMBAR HERMETIC 14G (Neuro Prosthesis/Implant) ×1 IMPLANT
CATHETER LUMBAR HERMETIC 14G (Neuro Prosthesis/Implant) ×3 IMPLANT
CLIP RANEY DISP (INSTRUMENTS) IMPLANT
CLOSURE WOUND 1/2 X4 (GAUZE/BANDAGES/DRESSINGS) ×1
DERMABOND ADVANCED (GAUZE/BANDAGES/DRESSINGS)
DERMABOND ADVANCED .7 DNX12 (GAUZE/BANDAGES/DRESSINGS) IMPLANT
DIFFUSER DRILL AIR PNEUMATIC (MISCELLANEOUS) ×3 IMPLANT
DRAPE C-ARM 35X43 STRL (DRAPES) ×4 IMPLANT
DRAPE INCISE IOBAN 66X45 STRL (DRAPES) ×3 IMPLANT
DRAPE ORTHO SPLIT 77X108 STRL (DRAPES) ×6
DRAPE POUCH INSTRU U-SHP 10X18 (DRAPES) ×3 IMPLANT
DRAPE SHEET LG 3/4 BI-LAMINATE (DRAPES) ×4 IMPLANT
DRAPE SURG 17X23 STRL (DRAPES) IMPLANT
DRAPE SURG ORHT 6 SPLT 77X108 (DRAPES) ×2 IMPLANT
DRSG OPSITE 4X5.5 SM (GAUZE/BANDAGES/DRESSINGS) ×4 IMPLANT
DRSG OPSITE POSTOP 4X6 (GAUZE/BANDAGES/DRESSINGS) IMPLANT
DRSG TELFA 3X8 NADH (GAUZE/BANDAGES/DRESSINGS) ×3 IMPLANT
ELECT REM PT RETURN 9FT ADLT (ELECTROSURGICAL) ×3
ELECTRODE REM PT RTRN 9FT ADLT (ELECTROSURGICAL) ×1 IMPLANT
GAUZE SPONGE 4X4 16PLY XRAY LF (GAUZE/BANDAGES/DRESSINGS) ×3 IMPLANT
GLOVE BIO SURGEON STRL SZ7 (GLOVE) IMPLANT
GLOVE BIO SURGEON STRL SZ8 (GLOVE) ×3 IMPLANT
GLOVE BIOGEL PI IND STRL 7.0 (GLOVE) IMPLANT
GLOVE BIOGEL PI INDICATOR 7.0 (GLOVE)
GOWN STRL REUS W/ TWL LRG LVL3 (GOWN DISPOSABLE) ×3 IMPLANT
GOWN STRL REUS W/ TWL XL LVL3 (GOWN DISPOSABLE) IMPLANT
GOWN STRL REUS W/TWL 2XL LVL3 (GOWN DISPOSABLE) ×3 IMPLANT
GOWN STRL REUS W/TWL LRG LVL3 (GOWN DISPOSABLE) ×9
GOWN STRL REUS W/TWL XL LVL3 (GOWN DISPOSABLE) ×6
KIT BASIN OR (CUSTOM PROCEDURE TRAY) ×3 IMPLANT
KIT DRAIN CSF ACCUDRAIN (MISCELLANEOUS) ×2 IMPLANT
KIT TURNOVER KIT B (KITS) ×3 IMPLANT
NDL HYPO 25X1 1.5 SAFETY (NEEDLE) ×1 IMPLANT
NEEDLE HYPO 25X1 1.5 SAFETY (NEEDLE) ×3 IMPLANT
NS IRRIG 1000ML POUR BTL (IV SOLUTION) ×3 IMPLANT
OIL CARTRIDGE MAESTRO DRILL (MISCELLANEOUS) ×3
PACK LAMINECTOMY NEURO (CUSTOM PROCEDURE TRAY) ×3 IMPLANT
PAD ARMBOARD 7.5X6 YLW CONV (MISCELLANEOUS) ×9 IMPLANT
PATTIES SURGICAL .5 X.5 (GAUZE/BANDAGES/DRESSINGS) IMPLANT
PATTIES SURGICAL .5 X3 (DISPOSABLE) IMPLANT
PATTIES SURGICAL .75X.75 (GAUZE/BANDAGES/DRESSINGS) IMPLANT
RUBBERBAND STERILE (MISCELLANEOUS) IMPLANT
SEALANT ADHERUS EXTEND TIP (MISCELLANEOUS) ×3 IMPLANT
SHEATH PERITONEAL INTRO 46 (MISCELLANEOUS) IMPLANT
SHEATH PERITONEAL INTRO 61 (MISCELLANEOUS) ×2 IMPLANT
SPONGE LAP 4X18 RFD (DISPOSABLE) IMPLANT
SPONGE SURGIFOAM ABS GEL SZ50 (HEMOSTASIS) ×3 IMPLANT
STAPLER VISISTAT 35W (STAPLE) ×3 IMPLANT
STRIP CLOSURE SKIN 1/2X4 (GAUZE/BANDAGES/DRESSINGS) ×2 IMPLANT
SUT ETHILON 3 0 FSL (SUTURE) ×4 IMPLANT
SUT NURALON 4 0 TR CR/8 (SUTURE) ×2 IMPLANT
SUT SILK 0 TIES 10X30 (SUTURE) IMPLANT
SUT SILK 2 0 TIES 17X18 (SUTURE) ×3
SUT SILK 2-0 18XBRD TIE BLK (SUTURE) ×1 IMPLANT
SUT SILK 3 0 SH 30 (SUTURE) IMPLANT
SUT VIC AB 0 CT1 18XCR BRD8 (SUTURE) IMPLANT
SUT VIC AB 0 CT1 8-18 (SUTURE) ×6
SUT VIC AB 2-0 CP2 18 (SUTURE) ×6 IMPLANT
SUT VIC AB 3-0 SH 8-18 (SUTURE) ×3 IMPLANT
SYR 20ML ECCENTRIC (SYRINGE) ×2 IMPLANT
SYR 5ML LL (SYRINGE) IMPLANT
SYR CONTROL 10ML LL (SYRINGE) ×6 IMPLANT
TOWEL GREEN STERILE (TOWEL DISPOSABLE) ×3 IMPLANT
TOWEL GREEN STERILE FF (TOWEL DISPOSABLE) ×3 IMPLANT
TRAY FOLEY MTR SLVR 16FR STAT (SET/KITS/TRAYS/PACK) IMPLANT
TUBE CONNECTING 12'X1/4 (SUCTIONS) ×1
TUBE CONNECTING 12X1/4 (SUCTIONS) ×1 IMPLANT
UNDERPAD 30X30 (UNDERPADS AND DIAPERS) ×3 IMPLANT
VALVE PROGRAM CODMAN HAKIM SG (NEUROSURGERY SUPPLIES) IMPLANT
VALVE W DISTAL CATH 2.5X14X120 (NEUROSURGERY SUPPLIES) IMPLANT
WATER STERILE IRR 1000ML POUR (IV SOLUTION) ×3 IMPLANT

## 2018-01-08 NOTE — Anesthesia Postprocedure Evaluation (Signed)
Anesthesia Post Note  Patient: Sonya Gibson  Procedure(s) Performed: Re-exploration and repair of previous Lumbar cerebro-spinal fluid leak and placement of Lumbar drain (N/A Spine Lumbar)     Patient location during evaluation: PACU Anesthesia Type: General Level of consciousness: awake and alert Pain management: pain level controlled Vital Signs Assessment: post-procedure vital signs reviewed and stable Respiratory status: spontaneous breathing, nonlabored ventilation, respiratory function stable and patient connected to nasal cannula oxygen Cardiovascular status: blood pressure returned to baseline and stable Postop Assessment: no apparent nausea or vomiting Anesthetic complications: no    Last Vitals:  Vitals:   01/08/18 1925 01/08/18 1940  BP: 138/84 (!) 142/90  Pulse: (!) 107 (!) 107  Resp: 11 12  Temp: (!) 36.2 C   SpO2: 98% 97%    Last Pain:  Vitals:   01/08/18 1920  TempSrc:   PainSc: 10-Worst pain ever                 Aras Albarran COKER

## 2018-01-08 NOTE — Anesthesia Procedure Notes (Signed)
Procedure Name: Intubation Date/Time: 01/08/2018 3:40 PM Performed by: Wilder GladeWinn, Mishel Sans G, CRNA Pre-anesthesia Checklist: Patient identified, Emergency Drugs available, Suction available, Patient being monitored and Timeout performed Patient Re-evaluated:Patient Re-evaluated prior to induction Oxygen Delivery Method: Circle system utilized Preoxygenation: Pre-oxygenation with 100% oxygen Induction Type: IV induction Ventilation: Mask ventilation without difficulty Laryngoscope Size: Miller and 2 Grade View: Grade I Tube type: Oral Tube size: 7.5 mm Number of attempts: 1 (brief atraumatic easily placed ) Airway Equipment and Method: Stylet Placement Confirmation: ETT inserted through vocal cords under direct vision,  positive ETCO2 and breath sounds checked- equal and bilateral Secured at: 22 cm Tube secured with: Tape Dental Injury: Teeth and Oropharynx as per pre-operative assessment

## 2018-01-08 NOTE — Transfer of Care (Signed)
Immediate Anesthesia Transfer of Care Note  Patient: Sonya Gibson  Procedure(s) Performed: Re-exploration and repair of previous Lumbar cerebro-spinal fluid leak and placement of Lumbar drain (N/A Spine Lumbar)  Patient Location: PACU  Anesthesia Type:General  Level of Consciousness: awake, alert , oriented and patient cooperative  Airway & Oxygen Therapy: Patient Spontanous Breathing and Patient connected to face mask oxygen  Post-op Assessment: Report given to RN and Post -op Vital signs reviewed and stable  Post vital signs: Reviewed and stable  Last Vitals:  Vitals Value Taken Time  BP    Temp    Pulse 104 01/08/2018  6:36 PM  Resp 14 01/08/2018  6:36 PM  SpO2 99 % 01/08/2018  6:36 PM  Vitals shown include unvalidated device data.  Last Pain:  Vitals:   01/08/18 1244  TempSrc:   PainSc: 7       Patients Stated Pain Goal: 5 (01/08/18 1200)  Complications: No apparent anesthesia complications

## 2018-01-08 NOTE — Progress Notes (Signed)
She continues to drain from her wound. It is worse when she sits up. Her headaches are worse when she sits up. Obviously, the myelogram was not accurate and showed no CSF leak. It is unclear to me how she would not heal this leak with fat graft, fibrin glue, and 8 days of aggressive lumbar drainage.I have explained this to she and her husband at length. Obviously this is a very frustrating situation. I have spoken to the multiple partners and based on these conversations I think the next best step is the lumboperitoneal shunt to be placed today. She remains on prophylactic antibiotics. I have some concern that even that will not be enough but I will oversew her lumbar wound. Hopefully this will be enough to at least stop the leak enough and allow the incision to heal. I suspect given her body habitus and the CSF pressure I saw last surgery that she has some type of high-pressure pseudotumor-like situation, and I hope the lumboperitoneal shunt will help correct that situation on a more permanent basis and allow the leak to heal. They understand the risks of the surgery include but are not limited to bleeding, infection, need for further surgery, continued leakage, failure of the device, need for revision of the device,increased pain, lack of relief of symptoms, worsening symptoms, and anesthesia risks including DVT pneumonia MI and death. They agree to proceed.

## 2018-01-08 NOTE — Op Note (Signed)
01/08/2018  6:12 PM  PATIENT:  Rusty Aus  32 y.o. female  PRE-OPERATIVE DIAGNOSIS:  Recurrent postoperative pseudomeningocele  POST-OPERATIVE DIAGNOSIS:  Same  PROCEDURE:  Lumbar reexploration with repair of pseudomeningocele not requiring laminectomy, placement of lumbar drain  SURGEON:  Marikay Alar, MD  ASSISTANTS: Dr. Danielle Dess  ANESTHESIA:   General  EBL: 150 ml  Total I/O In: 554.5 [P.O.:1; I.V.:553.5] Out: 875 [Urine:650; Emesis/NG output:75; Blood:150]  BLOOD ADMINISTERED: none  DRAINS: Lumbar drain  SPECIMEN:  none  INDICATION FOR PROCEDURE: This patient presented with recurrent drainage of spinal fluid from her wound. Imaging showed no evidence of CSF leak but this is obviously an accurate.  Recommended lumbar reexploration and placement of a lumboperitoneal shunt. Patient understood the risks, benefits, and alternatives and potential outcomes and wished to proceed.  Findings at operation: She had torn through her small suture line and there was an obvious 3 mm tear in the dura and medial to the old suture line with a nerve rootlets herniated through this. Once this was repaired we saw no evidence of continued spinal fluid leak and like the previous repair attempts. Dr. Danielle Dess and I discussed the situation thoroughly. We both decided that it was best to trust our suture repair, though we did decide to place another lumbar drain. This time was very different because her spinal fluid was under very low pressure whereas at the time of the last surgery she was under very high pressure. Therefore we felt that this time had a good chance of healing without placement of a permanent lumboperitoneal shunt which has his own risks and potential complications.  PROCEDURE DETAILS: The patient was taken to the operating room and after induction of adequate general endotracheal anesthesia she was rolled into the prone position on chest rolls and all pressure points were padded. Her  lumbar region was cleaned with Betadine scrub and then prepped with DuraPrep and draped in usual sterile fashion. Rolled incision was ellipsed out to get better skin edges to heal. We dissected down through the subcutaneous tissues which were quite deep. Debridement and clean these. We opened the fascia and found a small amount of spinal fluid. I removed the old fibrin glue and the old fat graft and under this I found a 3 mm open tear in the dura with a nerve rootlets herniating through this. This was medial to the old repair site. I was able to put the rootlets back into the dura. There was a much lower CSF pressure state during the surgery. I placed 2 4 Nurolon sutures into the site and repaired the dural tear primarily. We then tested Valsalva 4 different times up to 40 with no evidence of leakage of spinal fluid. She did not leak around the suture repair like she did last time. Dr. Danielle Dess and I discussed the situation. We weighed our options. We weighed permanent lumboperitoneal shunting versus primary closure versus placement of lumbar drain with primary closure. We decided to place the lumbar drain. This was placed through a separate stab incision at L2-3. This was under very low pressure. We brought in fluoroscopy to make sure we were in the CSF space. This helped to place the lumbar drain. It placed easily. It was under extremely low pressure. We drew back on a twice to make sure we were still getting spinal fluid. It did drop a few times during the case. We then irrigated with saline solution containing bacitracin. We dried all bleeding points. We inspected our repair  site once again and it looked completely dry. We sprayed Tisseel fibrin glue over this. We then closed the muscle and the fascia with 0 Vicryl. We close the cutaneous tissues in layers of 0 and 2-0 Vicryl. We then closed the subcuticular tissue with 3-0 Vicryl and the skin with interrupted 3-0 Ethilon vertical mattress sutures. The drain was  sewn into place. It was then hooked to his distal reservoir. The wound was then cleaned and a dressing was applied. The patient was then awakened from general anesthesia and transferred curving stable condition. At the end of the procedure all sponge needle and a short counts were correct.   PLAN OF CARE: Admit to inpatient   PATIENT DISPOSITION:  PACU - hemodynamically stable.   Delay start of Pharmacological VTE agent (>24hrs) due to surgical blood loss or risk of bleeding:  yes

## 2018-01-09 LAB — VANCOMYCIN, TROUGH: Vancomycin Tr: 14 ug/mL — ABNORMAL LOW (ref 15–20)

## 2018-01-09 NOTE — Progress Notes (Signed)
Called into the room by patient, on assessment, the lumbar drain was disconnected between the metal piece and the white rubber piece. MD on call was paged. PA Costella called back and said to reconnect it. Both ends cleansed thoroughly with povidone-Iodine swabstick and was reconnected back.

## 2018-01-09 NOTE — Progress Notes (Addendum)
Pharmacy Antibiotic Note  Rusty AusHolly E Clinard is a 32 y.o. female admitted on 12/20/2017 with possible wound infection.  Pharmacy has been consulted for vancomycin dosing. Patient had lumbar microdiscectomy 12/05/17 and presented 12/20/17 with a severe headache and leaking clear fluid from wound. Wound is now red which is believed to be a skin reaction to the dressing. Currently on Vancomycin  Vancomycin trough at 14  Plan: Will continue vanc 1750 q12 as this will continue to accumulate Will monitor renal function and recheck vanc trough in 5 to 7 days if it continues for that long. Length of therapy to be determined.  Height: 5\' 2"  (157.5 cm) Weight: (!) 344 lb 9.3 oz (156.3 kg) IBW/kg (Calculated) : 50.1  Temp (24hrs), Avg:97.8 F (36.6 C), Min:97 F (36.1 C), Max:98.7 F (37.1 C)  Recent Labs  Lab 01/03/18 0520  01/05/18 0500 01/07/18 0521 01/07/18 1455 01/09/18 1418  CREATININE 0.76  --  0.92 0.75  --   --   VANCOTROUGH  --    < >  --   --  10* 14*   < > = values in this interval not displayed.    Estimated Creatinine Clearance: 147.6 mL/min (by C-G formula based on SCr of 0.75 mg/dL).  nCrCl: 1200ml/min  Allergies  Allergen Reactions  . Cinnamon Anaphylaxis    (NO "REAL"/TREE BARK CINNAMON)  . Coconut Oil Anaphylaxis    Can eat "fake" coconut  . Lovenox [Enoxaparin] Other (See Comments)    BROKE DOWN THE SKIN AT INJECTION SITE AND CAUSED A WOUND  . Nitrofurantoin Hives   Jeanella Caraathy Leata Dominy, PharmD, [place]PS, [facility] Hospital[facility]CCM Clinical Pharmacist  Please check AMION for all [facility] numbers  01/09/2018 3:09 PM

## 2018-01-09 NOTE — Progress Notes (Signed)
Subjective: Patient reports back soreness, occ knee aching  Objective: Vital signs in last 24 hours: Temp:  [97 F (36.1 C)-98.2 F (36.8 C)] 97.7 F (36.5 C) (07/23 0400) Pulse Rate:  [45-120] 120 (07/23 0700) Resp:  [9-23] 23 (07/23 0700) BP: (96-159)/(66-100) 109/86 (07/23 0700) SpO2:  [86 %-100 %] 86 % (07/23 0700)  Intake/Output from previous day: 07/22 0701 - 07/23 0700 In: 3520.8 [P.O.:1; I.V.:2519.7; IV Piggyback:1000.1] Out: 4873 [Urine:4525; Emesis/NG output:75; Drains:123; Blood:150] Intake/Output this shift: No intake/output data recorded.  Neurologic: Grossly normal  Lab Results: Lab Results  Component Value Date   WBC 7.4 01/01/2018   HGB 10.3 (L) 01/01/2018   HCT 33.1 (L) 01/01/2018   MCV 91.2 01/01/2018   PLT 281 01/01/2018   No results found for: INR, PROTIME BMET Lab Results  Component Value Date   NA 140 01/07/2018   K 3.7 01/07/2018   CL 105 01/07/2018   CO2 28 01/07/2018   GLUCOSE 180 (H) 01/07/2018   BUN 6 01/07/2018   CREATININE 0.75 01/07/2018   CALCIUM 8.3 (L) 01/07/2018    Studies/Results: Dg Lumbar Spine 2-3 Views  Result Date: 01/08/2018 CLINICAL DATA:  Exploration and placement of lumbar drain. EXAM: DG C-ARM 61-120 MIN; LUMBAR SPINE - 2-3 VIEW Radiation exposure index: 12.98 mGy. COMPARISON:  CT scan of January 04, 2018. FINDINGS: Two intraoperative fluoroscopic images demonstrate surgical probe tip positioned over spinal canal in lower lumbar spine. Exact level cannot be determined based on images obtained. IMPRESSION: Fluoroscopic guidance provided during lumbar surgery. Electronically Signed   By: Lupita RaiderJames  Green Jr, M.D.   On: 01/08/2018 18:52   Dg C-arm 1-60 Min  Result Date: 01/08/2018 CLINICAL DATA:  Exploration and placement of lumbar drain. EXAM: DG C-ARM 61-120 MIN; LUMBAR SPINE - 2-3 VIEW Radiation exposure index: 12.98 mGy. COMPARISON:  CT scan of January 04, 2018. FINDINGS: Two intraoperative fluoroscopic images demonstrate  surgical probe tip positioned over spinal canal in lower lumbar spine. Exact level cannot be determined based on images obtained. IMPRESSION: Fluoroscopic guidance provided during lumbar surgery. Electronically Signed   By: Lupita RaiderJames  Green Jr, M.D.   On: 01/08/2018 18:52    Assessment/Plan: Continue LD and prophylactic abx and flat BR  Estimated body mass index is 63.02 kg/m as calculated from the following:   Height as of this encounter: 5\' 2"  (1.575 m).   Weight as of this encounter: 156.3 kg (344 lb 9.3 oz).    LOS: 20 days    Sonya Gibson 01/09/2018, 7:53 AM

## 2018-01-10 ENCOUNTER — Encounter (HOSPITAL_COMMUNITY): Payer: Self-pay | Admitting: Neurological Surgery

## 2018-01-10 MED ORDER — NALOXONE HCL 0.4 MG/ML IJ SOLN: 0.4000 mg | INTRAMUSCULAR | Status: DC | PRN

## 2018-01-10 MED ORDER — SODIUM CHLORIDE 0.9% FLUSH: 9.0000 mL | INTRAVENOUS | Status: DC | PRN

## 2018-01-10 MED ORDER — DIPHENHYDRAMINE HCL 12.5 MG/5ML PO ELIX: 12.5000 mg | ORAL_SOLUTION | Freq: Four times a day (QID) | ORAL | Status: DC | PRN

## 2018-01-10 MED ORDER — DIPHENHYDRAMINE HCL 50 MG/ML IJ SOLN
12.5000 mg | Freq: Four times a day (QID) | INTRAMUSCULAR | Status: DC | PRN
  Administered 2018-01-13: 12.5 mg via INTRAVENOUS
  Filled 2018-01-10: qty 1

## 2018-01-10 MED ORDER — HYDROMORPHONE 1 MG/ML IV SOLN
INTRAVENOUS | Status: DC
  Administered 2018-01-10: 3.2 mg via INTRAVENOUS
  Administered 2018-01-10: 13:00:00 via INTRAVENOUS
  Administered 2018-01-11: 1.8 mg via INTRAVENOUS
  Administered 2018-01-11: 1.2 mg via INTRAVENOUS
  Administered 2018-01-11: 1.8 mg via INTRAVENOUS
  Administered 2018-01-12: 2.4 mg via INTRAVENOUS
  Administered 2018-01-12: 1.5 mg via INTRAVENOUS
  Administered 2018-01-12: 0.6 mg via INTRAVENOUS
  Administered 2018-01-12: 2.1 mg via INTRAVENOUS
  Administered 2018-01-12: 1.2 mg via INTRAVENOUS
  Administered 2018-01-12: 25 mg via INTRAVENOUS
  Administered 2018-01-13: 1.2 mg via INTRAVENOUS
  Administered 2018-01-13: 2.1 mg via INTRAVENOUS
  Administered 2018-01-13: 2.7 mg via INTRAVENOUS
  Administered 2018-01-13: 1.5 mg via INTRAVENOUS
  Administered 2018-01-14: 2.4 mg via INTRAVENOUS
  Administered 2018-01-14: 0.9 mg via INTRAVENOUS
  Administered 2018-01-14: 0 mg via INTRAVENOUS
  Administered 2018-01-14: 2.7 mg via INTRAVENOUS
  Administered 2018-01-15: via INTRAVENOUS
  Administered 2018-01-15: 0.3 mg via INTRAVENOUS
  Filled 2018-01-10 (×3): qty 25

## 2018-01-10 MED ORDER — ONDANSETRON HCL 4 MG/2ML IJ SOLN: 4.0000 mg | Freq: Four times a day (QID) | INTRAMUSCULAR | Status: DC | PRN

## 2018-01-10 NOTE — Progress Notes (Signed)
Subjective: Patient reports back soreness and burning  Objective: Vital signs in last 24 hours: Temp:  [97.9 F (36.6 C)-99.4 F (37.4 C)] 98.4 F (36.9 C) (07/24 0900) Pulse Rate:  [106-120] 114 (07/23 1900) Resp:  [16-24] 19 (07/24 1000) BP: (100-131)/(62-103) 123/101 (07/24 1000) SpO2:  [89 %-97 %] 92 % (07/23 1900)  Intake/Output from previous day: 07/23 0701 - 07/24 0700 In: 3092.3 [P.O.:120; I.V.:1785.7; IV Piggyback:1186.6] Out: 3744 [Urine:3400; Drains:344] Intake/Output this shift: Total I/O In: 224.8 [I.V.:224.8] Out: 51 [Drains:51]  Neurologic: Grossly normal  Lab Results: Lab Results  Component Value Date   WBC 7.4 01/01/2018   HGB 10.3 (L) 01/01/2018   HCT 33.1 (L) 01/01/2018   MCV 91.2 01/01/2018   PLT 281 01/01/2018   No results found for: INR, PROTIME BMET Lab Results  Component Value Date   NA 140 01/07/2018   K 3.7 01/07/2018   CL 105 01/07/2018   CO2 28 01/07/2018   GLUCOSE 180 (H) 01/07/2018   BUN 6 01/07/2018   CREATININE 0.75 01/07/2018   CALCIUM 8.3 (L) 01/07/2018    Studies/Results: Dg Lumbar Spine 2-3 Views  Result Date: 01/08/2018 CLINICAL DATA:  Exploration and placement of lumbar drain. EXAM: DG C-ARM 61-120 MIN; LUMBAR SPINE - 2-3 VIEW Radiation exposure index: 12.98 mGy. COMPARISON:  CT scan of January 04, 2018. FINDINGS: Two intraoperative fluoroscopic images demonstrate surgical probe tip positioned over spinal canal in lower lumbar spine. Exact level cannot be determined based on images obtained. IMPRESSION: Fluoroscopic guidance provided during lumbar surgery. Electronically Signed   By: Lupita RaiderJames  Green Jr, M.D.   On: 01/08/2018 18:52   Dg C-arm 1-60 Min  Result Date: 01/08/2018 CLINICAL DATA:  Exploration and placement of lumbar drain. EXAM: DG C-ARM 61-120 MIN; LUMBAR SPINE - 2-3 VIEW Radiation exposure index: 12.98 mGy. COMPARISON:  CT scan of January 04, 2018. FINDINGS: Two intraoperative fluoroscopic images demonstrate surgical  probe tip positioned over spinal canal in lower lumbar spine. Exact level cannot be determined based on images obtained. IMPRESSION: Fluoroscopic guidance provided during lumbar surgery. Electronically Signed   By: Lupita RaiderJames  Green Jr, M.D.   On: 01/08/2018 18:52    Assessment/Plan: LD patent, continue abx and LD for 2 more days  Estimated body mass index is 63.02 kg/m as calculated from the following:   Height as of this encounter: 5\' 2"  (1.575 m).   Weight as of this encounter: 156.3 kg (344 lb 9.3 oz).    LOS: 21 days    Sonya Gibson 01/10/2018, 10:35 AM

## 2018-01-11 MED ORDER — SODIUM CHLORIDE 0.9 % IV SOLN
INTRAVENOUS | Status: DC
  Administered 2018-01-11: 12:00:00 via INTRAVENOUS

## 2018-01-11 NOTE — Progress Notes (Signed)
Subjective: Patient reports mild headache at times, hips ache, no leg pain or NTW  Objective: Vital signs in last 24 hours: Temp:  [98 F (36.7 C)-98.5 F (36.9 C)] 98 F (36.7 C) (07/25 0000) Pulse Rate:  [73-102] 73 (07/25 0700) Resp:  [11-29] 15 (07/25 0700) BP: (88-123)/(49-103) 92/63 (07/25 0045) SpO2:  [65 %-100 %] 91 % (07/25 0700)  Intake/Output from previous day: 07/24 0701 - 07/25 0700 In: 1212.3 [P.O.:240; I.V.:372.2; IV Piggyback:600.1] Out: 2800 [Urine:2500; Drains:300] Intake/Output this shift: No intake/output data recorded.  Neurologic: Grossly normal  Lab Results: Lab Results  Component Value Date   WBC 7.4 01/01/2018   HGB 10.3 (L) 01/01/2018   HCT 33.1 (L) 01/01/2018   MCV 91.2 01/01/2018   PLT 281 01/01/2018   No results found for: INR, PROTIME BMET Lab Results  Component Value Date   NA 140 01/07/2018   K 3.7 01/07/2018   CL 105 01/07/2018   CO2 28 01/07/2018   GLUCOSE 180 (H) 01/07/2018   BUN 6 01/07/2018   CREATININE 0.75 01/07/2018   CALCIUM 8.3 (L) 01/07/2018    Studies/Results: No results found.  Assessment/Plan: Doing better, continue LD and prophylactic ABX  Estimated body mass index is 63.02 kg/m as calculated from the following:   Height as of this encounter: 5\' 2"  (1.575 m).   Weight as of this encounter: 156.3 kg (344 lb 9.3 oz).    LOS: 22 days    Sonya Gibson S 01/11/2018, 8:02 AM

## 2018-01-12 LAB — BASIC METABOLIC PANEL
ANION GAP: 12 (ref 5–15)
BUN: <5 mg/dL — ABNORMAL LOW (ref 6–20)
CO2: 27 mmol/L (ref 22–32)
Calcium: 8.6 mg/dL — ABNORMAL LOW (ref 8.9–10.3)
Chloride: 101 mmol/L (ref 98–111)
Creatinine, Ser: 0.84 mg/dL (ref 0.44–1.00)
GFR calc non Af Amer: >60 mL/min (ref >60)
Glucose, Bld: 119 mg/dL — ABNORMAL HIGH (ref 70–99)
POTASSIUM: 3.9 mmol/L (ref 3.5–5.1)
Sodium: 140 mmol/L (ref 135–145)

## 2018-01-12 NOTE — Progress Notes (Signed)
Pharmacy Antibiotic Note  Sonya Gibson is a 32 y.o. female admitted on 12/20/2017 with possible wound infection.  Pharmacy has been consulted for vancomycin dosing. Patient had lumbar microdiscectomy 12/05/17 and presented 12/20/17 with a severe headache and leaking clear fluid from wound. Wound red, believed to be a skin reaction to the dressing. Vancomycin trough therapeutic on 7/23. Scr stable.  Per neurosurgery note on 7/24, continue vancomycin and lumbar drain x 2 more days. No updatated note yet today.  Plan: Continue vanc 1750 q12h for now Monitor clinical progress, c/s, renal function, vancomycin trough as indicated F/u de-escalation plan/LOT - possibly d/c today per neurosurgery previous notes  Height: 5\' 2"  (157.5 cm) Weight: (!) 344 lb 9.3 oz (156.3 kg) IBW/kg (Calculated) : 50.1  Temp (24hrs), Avg:98.5 F (36.9 C), Min:98.3 F (36.8 C), Max:99 F (37.2 C)  Recent Labs  Lab 01/07/18 0521 01/07/18 1455 01/09/18 1418 01/12/18 0616  CREATININE 0.75  --   --  0.84  VANCOTROUGH  --  10* 14*  --     Estimated Creatinine Clearance: 140.6 mL/min (by C-G formula based on SCr of 0.84 mg/dL).  nCrCl: 1200ml/min  Allergies  Allergen Reactions  . Cinnamon Anaphylaxis    (NO "REAL"/TREE BARK CINNAMON)  . Coconut Oil Anaphylaxis    Can eat "fake" coconut  . Lovenox [Enoxaparin] Other (See Comments)    BROKE DOWN THE SKIN AT INJECTION SITE AND CAUSED A WOUND  . Nitrofurantoin Hives   Sonya Gibson, PharmD, BCPS Clinical Pharmacist Please check AMION for all [facility] contact numbers 01/12/2018 3:08 PM

## 2018-01-12 NOTE — Progress Notes (Signed)
Patient looks really good today. No headache or leg pain. Tolerating lumbar drainage well. Lumbar drain patent with clear CSF. An well and we will continue her lumbar drainage for at least 1 more day with flat bed rest

## 2018-01-13 NOTE — Progress Notes (Signed)
Lumbar drain got detached from tubing, OK to reattach per Selena BattenKim NP.

## 2018-01-13 NOTE — Progress Notes (Signed)
Reapply dressing over lower back incision, keep lumbar brain clamped until reassessment with Selena BattenKim NP per Dr. Yetta BarreJones.

## 2018-01-13 NOTE — Progress Notes (Signed)
Subjective: Patient reports Doing well mild headache no evidence of drainage from her lumbar wound  Objective: Vital signs in last 24 hours: Temp:  [97.5 F (36.4 C)-99 F (37.2 C)] 97.5 F (36.4 C) (07/27 0000) Pulse Rate:  [87-101] 95 (07/27 0700) Resp:  [10-43] 12 (07/27 0700) BP: (83-114)/(47-80) 91/63 (07/27 0600) SpO2:  [89 %-98 %] 98 % (07/27 0700)  Intake/Output from previous day: 07/26 0701 - 07/27 0700 In: 1957.2 [P.O.:720; I.V.:62.2; IV Piggyback:1175] Out: 2084 [Urine:1815; Drains:269] Intake/Output this shift: No intake/output data recorded.  Wound dry neurologically intact  Lab Results: No results for input(s): WBC, HGB, HCT, PLT in the last 72 hours. BMET Recent Labs    01/12/18 0616  NA 140  K 3.9  CL 101  CO2 27  GLUCOSE 119*  BUN <5*  CREATININE 0.84  CALCIUM 8.6*    Studies/Results: No results found.  Assessment/Plan: Possible discontinuing lumbar drain today we'll discuss with Dr. Yetta BarreJones.  LOS: 24 days     Sonya Gibson P 01/13/2018, 7:19 AM

## 2018-01-14 MED ORDER — TRAZODONE HCL 50 MG PO TABS
150.0000 mg | ORAL_TABLET | Freq: Every evening | ORAL | Status: DC | PRN
  Administered 2018-01-14: 150 mg via ORAL

## 2018-01-14 NOTE — Progress Notes (Signed)
NEUROSURGERY PROGRESS NOTE  Doing well. Very small amount of serosanguinous drainage from bottom of incision.  Temp:  [97.6 F (36.4 C)-98.2 F (36.8 C)] 97.9 F (36.6 C) (07/28 0400) Pulse Rate:  [94-107] 95 (07/28 0700) Resp:  [12-22] 15 (07/28 0700) BP: (80-123)/(49-80) 110/65 (07/28 0700) SpO2:  [87 %-98 %] 94 % (07/28 0700)  Plan:  Will slowly raise head of bed more today and get up to bathroom and back. Will transfer to progressive unit. Continue to assess for drainage from incision.   Sherryl MangesKimberly Hannah Bonney Berres, NP 01/14/2018 8:12 AM

## 2018-01-14 NOTE — Progress Notes (Signed)
Patient just arrived to the unit from [facility]ICU as a transfer. Patient arrived to the unit with a PCA pump. Called the RN back due to not having the CO2 nasal cannula with them. She said she would tube it up.

## 2018-01-14 NOTE — Progress Notes (Signed)
OK for patient to be transferred to [facility] per Dr Mikal Planeabell

## 2018-01-15 LAB — VANCOMYCIN, TROUGH: Vancomycin Tr: 19 ug/mL (ref 15–20)

## 2018-01-15 MED ORDER — OXYCODONE HCL 5 MG PO TABS
10.0000 mg | ORAL_TABLET | ORAL | Status: DC | PRN
  Administered 2018-01-15 – 2018-01-16 (×5): 10 mg via ORAL
  Filled 2018-01-15 (×7): qty 2

## 2018-01-15 MED ORDER — ALTEPLASE 2 MG IJ SOLR
2.0000 mg | Freq: Once | INTRAMUSCULAR | Status: AC
  Administered 2018-01-15: 2 mg
  Filled 2018-01-15: qty 2

## 2018-01-15 MED ORDER — HYDROMORPHONE HCL 1 MG/ML IJ SOLN
1.0000 mg | INTRAMUSCULAR | Status: DC | PRN
  Administered 2018-01-15 – 2018-01-16 (×6): 1 mg via INTRAVENOUS
  Filled 2018-01-15 (×6): qty 1

## 2018-01-15 NOTE — Care Management Note (Signed)
Case Management Note  Patient Details  Name: Rusty AusHolly E Bayard MRN: [id number] Date of Birth: 06/01/1986  Subjective/Objective:                    Action/Plan: Pt s/p lumbar drain. She continues to have some drainage at base of incision. Awaiting PT/OT evals. CM following for d/c disposition.  Expected Discharge Date:                  Expected Discharge Plan:     In-House Referral:     Discharge planning Services     Post Acute Care Choice:    Choice offered to:     DME Arranged:    DME Agency:     HH Arranged:    HH Agency:     Status of Service:  In process, will continue to follow  If discussed at Long Length of Stay Meetings, dates discussed:    Additional Comments:  Kermit BaloKelli F Exodus Kutzer, RN 01/15/2018, 11:29 AM

## 2018-01-15 NOTE — Plan of Care (Signed)
°  Problem: Coping: °Goal: Level of anxiety will decrease °Outcome: Progressing °  °

## 2018-01-15 NOTE — Progress Notes (Signed)
Subjective: Patient reports occassional leg pain. Some very mild headaches at times but does not feel like they were before.   Objective: Vital signs in last 24 hours: Temp:  [98.4 F (36.9 C)-99.1 F (37.3 C)] 98.5 F (36.9 C) (07/29 0008) Pulse Rate:  [95-104] 99 (07/28 1500) Resp:  [13-22] 14 (07/29 0526) BP: (94-117)/(56-96) 109/62 (07/29 0410) SpO2:  [94 %-100 %] 96 % (07/29 0526) FiO2 (%):  [97 %-99 %] 97 % (07/29 0526)  Intake/Output from previous day: 07/28 0701 - 07/29 0700 In: 3596.7 [IV Piggyback:3596.7] Out: -  Intake/Output this shift: No intake/output data recorded.  Neurologic: Grossly normal  Lab Results: Lab Results  Component Value Date   WBC 7.4 01/01/2018   HGB 10.3 (L) 01/01/2018   HCT 33.1 (L) 01/01/2018   MCV 91.2 01/01/2018   PLT 281 01/01/2018   No results found for: INR, PROTIME BMET Lab Results  Component Value Date   NA 140 01/12/2018   K 3.9 01/12/2018   CL 101 01/12/2018   CO2 27 01/12/2018   GLUCOSE 119 (H) 01/12/2018   BUN <5 (L) 01/12/2018   CREATININE 0.84 01/12/2018   CALCIUM 8.6 (L) 01/12/2018    Studies/Results: No results found.  Assessment/Plan: Wound does have some serosanguinous fluid from the bottom part of the incision. Placed a staple over it to see if this stops drainage. Got up to the bedside commode yesterday and did well. Will work with PT/OT today. Discontinue PCA pump and foley today.    LOS: 26 days    Sonya Gibson 01/15/2018, 8:11 AM

## 2018-01-16 MED ORDER — OXYCODONE HCL 5 MG PO TABS: 5.0000 mg | ORAL_TABLET | ORAL | 0 refills | Status: DC | PRN

## 2018-01-16 NOTE — Evaluation (Signed)
Physical Therapy Evaluation Patient Details Name: Sonya Gibson MRN: [id number] DOB: 03-14-1986 Today's Date: 01/16/2018   History of Present Illness  Pt is a 32 y/o female admitted with HA and CSF leak with microdiscectomy initially on 6/18 now s/p exploration of wound with lumbar drain on 7/3 and 7/10. 7/22 Lumbar reexploration with repair of pseudomeningocele not requiring laminectomy, placement of lumbar drain followed by 5 days flat bedrest. PMHx: HTN, PTSD, anxiety, and depression.    Clinical Impression  Patient admitted with the above listed diagnosis. Patient today presenting with reduced strength, endurance, safety, and functional mobility. Requires Min A to min guard for transfers and mobility. One instance of near LOB requiring seated rest break - able to then further progress mobility without issue. VSS throughout mobility. PT to follow acutely to maximize safe functional mobility prior to d/c. Will recommend HHPT at discharge to ensure safe transition into the home environment.    Follow Up Recommendations Home health PT;Supervision - Intermittent    Equipment Recommendations  None recommended by PT    Recommendations for Other Services       Precautions / Restrictions Precautions Precautions: Back Restrictions Weight Bearing Restrictions: No      Mobility  Bed Mobility Overal bed mobility: Modified Independent             General bed mobility comments: HOB elevated with multiple pillows propped. Will likely require increased assistance from flat bed  Transfers Overall transfer level: Needs assistance Equipment used: Rolling walker (2 wheeled) Transfers: Sit to/from Stand Sit to Stand: Min assist;Min guard;+2 safety/equipment         General transfer comment: Min A to min guard - unsteadiness with initial transfer - short ambulation distance with near LOB requiring sit to rest  Ambulation/Gait Ambulation/Gait assistance: Min guard;+2  safety/equipment Gait Distance (Feet): 120 Feet Assistive device: Rolling walker (2 wheeled);None Gait Pattern/deviations: Step-through pattern;Decreased stride length;Trunk flexed Gait velocity: Decreased   General Gait Details: verbal cueing for upright posture; slow steady pattern, able to progress gait while keeping patient occupied from task; does require 2 seated rest breaks at initiation of mobility  Stairs            Wheelchair Mobility    Modified Rankin (Stroke Patients Only)       Balance Overall balance assessment: Needs assistance Sitting-balance support: No upper extremity supported;Feet supported Sitting balance-Leahy Scale: Good     Standing balance support: Bilateral upper extremity supported;During functional activity Standing balance-Leahy Scale: Fair Standing balance comment: reliance on RW                             Pertinent Vitals/Pain Pain Assessment: Faces Faces Pain Scale: Hurts a little bit Pain Location: low back Pain Descriptors / Indicators: Guarding Pain Intervention(s): Limited activity within patient's tolerance;Monitored during session;Repositioned    Home Living Family/patient expects to be discharged to:: Private residence Living Arrangements: Spouse/significant other Available Help at Discharge: Family;Available PRN/intermittently Type of Home: House Home Access: Ramped entrance     Home Layout: One level Home Equipment: Walker - 2 wheels;Bedside commode      Prior Function Level of Independence: Independent with assistive device(s)         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      Hand Dominance   Dominant Hand: Right    Extremity/Trunk Assessment   Upper Extremity Assessment Upper Extremity Assessment: Defer to OT evaluation    Lower Extremity  Assessment Lower Extremity Assessment: Generalized weakness    Cervical / Trunk Assessment Cervical / Trunk Assessment: Other exceptions Cervical /  Trunk Exceptions: s/p surg  Communication   Communication: No difficulties  Cognition Arousal/Alertness: Awake/alert Behavior During Therapy: WFL for tasks assessed/performed Overall Cognitive Status: Within Functional Limits for tasks assessed                                        General Comments General comments (skin integrity, edema, etc.): PT assessing incision - clean and dry    Exercises     Assessment/Plan    PT Assessment Patient needs continued PT services  PT Problem List Decreased activity tolerance;Decreased mobility;Pain;Decreased strength;Decreased balance;Decreased knowledge of use of DME;Decreased safety awareness       PT Treatment Interventions DME instruction;Gait training;Stair training;Functional mobility training;Therapeutic activities;Therapeutic exercise;Balance training;Patient/family education    PT Goals (Current goals can be found in the Care Plan section)  Acute Rehab PT Goals Patient Stated Goal: to go home today PT Goal Formulation: With patient Time For Goal Achievement: 01/23/18 Potential to Achieve Goals: Good    Frequency Min 5X/week   Barriers to discharge        Co-evaluation PT/OT/SLP Co-Evaluation/Treatment: Yes Reason for Co-Treatment: For patient/therapist safety;To address functional/ADL transfers PT goals addressed during session: Mobility/safety with mobility         AM-PAC PT "6 Clicks" Daily Activity  Outcome Measure Difficulty turning over in bed (including adjusting bedclothes, sheets and blankets)?: A Little Difficulty moving from lying on back to sitting on the side of the bed? : A Little Difficulty sitting down on and standing up from a chair with arms (e.g., wheelchair, bedside commode, etc,.)?: Unable Help needed moving to and from a bed to chair (including a wheelchair)?: A Little Help needed walking in hospital room?: A Little Help needed climbing 3-5 steps with a railing? : A Lot 6 Click  Score: 15    End of Session Equipment Utilized During Treatment: Gait belt Activity Tolerance: Patient tolerated treatment well Patient left: in chair;with call bell/phone within reach;with chair alarm set Nurse Communication: Mobility status PT Visit Diagnosis: Unsteadiness on feet (R26.81);Other abnormalities of gait and mobility (R26.89);Muscle weakness (generalized) (M62.81);Pain Pain - part of body: (low back)    Time: 1308-65780918-0951 PT Time Calculation (min) (ACUTE ONLY): 33 min   Charges:   PT Evaluation $PT Eval Moderate Complexity: 1 Mod          Kipp LaurenceStephanie R Aaron, PT, DPT 01/16/18 10:55 AM Pager: [phone]

## 2018-01-16 NOTE — Evaluation (Signed)
Occupational Therapy Evaluation Patient Details Name: Sonya Gibson MRN: [id number] DOB: [birth year] Today's Date: 01/16/2018    History of Present Illness Pt is a 32 y/o female admitted with HA and CSF leak with microdiscectomy initially on 6/18 now s/p exploration of wound with lumbar drain on 7/3 and 7/10. 7/22 Lumbar reexploration with repair of pseudomeningocele not requiring laminectomy, placement of lumbar drain followed by 5 days flat bedrest. PMHx: HTN, PTSD, anxiety, and depression.   Clinical Impression   Patient is s/p CSF leak with x3 surgeries ( 73/, 7/10, 7/22) resulting in functional limitations due to the deficits listed below (see OT problem list). Pt currently with decr balance that affects all adls. Pt progressed OOB and out of room this session. Pt did have one event of LOB with return to sitting and able to continue the session.  Patient will benefit from skilled OT acutely to increase independence and safety with ADLS to allow discharge hhot. Pt has all necessary DME.     Follow Up Recommendations  Home health OT    Equipment Recommendations       Recommendations for Other Services       Precautions / Restrictions Precautions Precautions: Back      Mobility Bed Mobility Overal bed mobility: Modified Independent             General bed mobility comments: pt with HOB elevated for transfer and elevated on x2 pillows. pt will have incr need to (A) from flat bed surface  Transfers Overall transfer level: Needs assistance   Transfers: Sit to/from Stand Sit to Stand: Min guard         General transfer comment: pt requires more guarding after LOB but able to progress with session. pt without second event and progressing outside the room    Balance     Sitting balance-Leahy Scale: Good       Standing balance-Leahy Scale: Fair Standing balance comment: reliance on RW                           ADL either performed or assessed with  clinical judgement   ADL Overall ADL's : Needs assistance/impaired Eating/Feeding: Modified independent;Sitting   Grooming: Supervision/safety;Sitting   Upper Body Bathing: Set up;Sitting   Lower Body Bathing: Moderate assistance;Sit to/from stand Lower Body Bathing Details (indicate cue type and reason): pt has AE at home that uses at baseline         Toilet Transfer: Min guard;RW           Functional mobility during ADLs: +2 for safety/equipment;Minimal assistance General ADL Comments: pt during session with (A) for LOB and pt reports "light headed and dizzy" and required return to sitting. pt reports "i am shaking" pt once sitting for extended period of time able to rebound. BP obtain and stable. BP stable throughout session     Vision Baseline Vision/History: Wears glasses Wears Glasses: At all times       Perception     Praxis      Pertinent Vitals/Pain Pain Assessment: Faces Faces Pain Scale: Hurts a little bit Pain Location: low back Pain Descriptors / Indicators: Guarding Pain Intervention(s): Limited activity within patient's tolerance;Monitored during session;Premedicated before session;Repositioned     Hand Dominance Right   Extremity/Trunk Assessment Upper Extremity Assessment Upper Extremity Assessment: Overall WFL for tasks assessed   Lower Extremity Assessment Lower Extremity Assessment: Defer to PT evaluation   Cervical / Trunk Assessment Cervical /  Trunk Assessment: Other exceptions Cervical / Trunk Exceptions: s/p surg   Communication Communication Communication: No difficulties   Cognition Arousal/Alertness: Awake/alert Behavior During Therapy: WFL for tasks assessed/performed Overall Cognitive Status: Within Functional Limits for tasks assessed                                     General Comments  dressing assess by PT Judeth CornfieldStephanie during session and reports "good"    Exercises     Shoulder Instructions      Home  Living Family/patient expects to be discharged to:: Private residence Living Arrangements: Spouse/significant other Available Help at Discharge: Family;Available PRN/intermittently Type of Home: House Home Access: Ramped entrance     Home Layout: One level     Bathroom Shower/Tub: Chief Strategy OfficerTub/shower unit   Bathroom Toilet: Standard     Home Equipment: Environmental consultantWalker - 2 wheels;Bedside commode          Prior Functioning/Environment Level of Independence: Independent with assistive device(s)        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         OT Problem List: Decreased strength;Decreased activity tolerance;Decreased range of motion;Impaired balance (sitting and/or standing);Decreased coordination;Decreased safety awareness;Decreased knowledge of use of DME or AE;Decreased knowledge of precautions;Obesity;Pain      OT Treatment/Interventions: Self-care/ADL training;Therapeutic exercise;Neuromuscular education;Energy conservation;DME and/or AE instruction;Therapeutic activities;Patient/family education;Balance training    OT Goals(Current goals can be found in the care plan section) Acute Rehab OT Goals Patient Stated Goal: to go home today OT Goal Formulation: With patient Time For Goal Achievement: 01/30/18 Potential to Achieve Goals: Good  OT Frequency: Min 2X/week   Barriers to D/C:            Co-evaluation              AM-PAC PT "6 Clicks" Daily Activity     Outcome Measure Help from another person eating meals?: None Help from another person taking care of personal grooming?: A Little Help from another person toileting, which includes using toliet, bedpan, or urinal?: A Lot Help from another person bathing (including washing, rinsing, drying)?: A Little Help from another person to put on and taking off regular upper body clothing?: A Little Help from another person to put on and taking off regular lower body clothing?: A Little 6 Click Score: 18   End of Session  Equipment Utilized During Treatment: Gait belt;Rolling walker Nurse Communication: Mobility status;Precautions  Activity Tolerance: Patient tolerated treatment well Patient left: in chair;with call bell/phone within reach;with chair alarm set  OT Visit Diagnosis: Unsteadiness on feet (R26.81);Muscle weakness (generalized) (M62.81) Pain - part of body: (back)                Time: [id number] OT Time Calculation (min): 33 min Charges:  OT General Charges $OT Visit: 1 Visit OT Evaluation $OT Eval Moderate Complexity: 1 Mod   Mateo FlowJones, Brynn   OTR/L Pager: [phone] Office: [phone] .   Boone MasterJones, Adenike Shidler B 01/16/2018, 10:38 AM

## 2018-01-16 NOTE — Care Management Note (Signed)
Case Management Note  Patient Details  Name: Sonya Gibson MRN: [id number] Date of Birth: 07/01/1985  Subjective/Objective:                    Action/Plan: [facility]. [facility]. [facility]. Pt states her spouse will provide assistance at home and transportation to home.   Expected Discharge Date:  01/16/18               Expected Discharge Plan:  Home w Home Health Services  In-House Referral:     Discharge planning Services  CM Consult  Post Acute Care Choice:  Home Health Choice offered to:  Patient  DME Arranged:    DME Agency:     HH Arranged:  PT HH Agency:  [facility]  Status of Service:  Completed, signed off  If discussed at Long Length of Stay Meetings, dates discussed:    Additional Comments:  Kermit BaloKelli F Jaelani Posa, RN 01/16/2018, 11:23 AM

## 2018-01-16 NOTE — Progress Notes (Signed)
NURSING PROGRESS NOTE  Sonya Gibson [id number] Discharge Data: 01/16/2018 12:13 PM Attending Provider: Tia AlertJones, David S, MD WNU:[id number]:Newlin, Odette HornsEnobong, MD     Sonya Gibson to be D/C'd Home per MD order.  Discussed with the patient the After Visit Summary and all questions fully answered. All IV's discontinued with no bleeding noted. All belongings returned to patient for patient to take home.   @ pt's request RN applied a dressing at the pt's incision for comfort.   Last Vital Signs:  Blood pressure 100/66, pulse 96, temperature 97.7 F (36.5 C), temperature source Oral, resp. rate (!) 21, height 5\' 2"  (1.575 m), weight (!) 156.3 kg (344 lb 9.3 oz), SpO2 92 %.  Discharge Medication List Allergies as of 01/16/2018      Reactions   Cinnamon Anaphylaxis   (NO "REAL"/TREE BARK CINNAMON)   Coconut Oil Anaphylaxis   Can eat "fake" coconut   Lovenox [enoxaparin] Other (See Comments)   BROKE DOWN THE SKIN AT INJECTION SITE AND CAUSED A WOUND   Nitrofurantoin Hives      Medication List    TAKE these medications   calcium carbonate 750 MG chewable tablet Commonly known as:  TUMS EX Chew 2 tablets by mouth as needed for heartburn.   clonazePAM 2 MG tablet Commonly known as:  KLONOPIN Take 1 tablet (2 mg total) by mouth 2 (two) times daily for 15 doses.   cyclobenzaprine 10 MG tablet Commonly known as:  FLEXERIL Take 1 tablet (10 mg total) by mouth 3 (three) times daily as needed for muscle spasms.   diphenhydrAMINE 25 mg capsule Commonly known as:  BENADRYL Take 50 mg by mouth every 6 (six) hours as needed for sleep.   gabapentin 300 MG capsule Commonly known as:  NEURONTIN Take 2 capsules (600 mg total) by mouth 3 (three) times daily.   ibuprofen 200 MG tablet Commonly known as:  ADVIL,MOTRIN Take 800-1,000 mg by mouth every 6 (six) hours as needed (for pain or headaches).   levothyroxine 50 MCG tablet Commonly known as:  SYNTHROID, LEVOTHROID Take 50 mcg by mouth daily.    lisinopril-hydrochlorothiazide 10-12.5 MG tablet Commonly known as:  PRINZIDE,ZESTORETIC Take 1 tablet by mouth at bedtime.   magnesium citrate Soln Take 1 Bottle by mouth once as needed for mild constipation or moderate constipation.   Melatonin 10 MG Tabs Take 10 mg by mouth daily as needed (sleep).   metoCLOPramide 10 MG tablet Commonly known as:  REGLAN Take 1 tablet (10 mg total) by mouth every 6 (six) hours as needed for nausea (or headache).   neomycin-bacitracin-polymyxin Oint Commonly known as:  NEOSPORIN Apply 1 application topically 2 (two) times daily. What changed:    when to take this  additional instructions   ondansetron 8 MG disintegrating tablet Commonly known as:  ZOFRAN ODT Take 1 tablet (8 mg total) by mouth every 8 (eight) hours as needed for up to 20 doses for nausea or vomiting.   oxyCODONE 5 MG immediate release tablet Commonly known as:  Oxy IR/ROXICODONE Take 1 tablet (5 mg total) by mouth every 4 (four) hours as needed (for pain). What changed:  when to take this   promethazine 12.5 MG tablet Commonly known as:  PHENERGAN Take 1 tablet (12.5 mg total) by mouth every 6 (six) hours as needed for refractory nausea / vomiting.   traZODone 150 MG tablet Commonly known as:  DESYREL Take 150 mg by mouth at bedtime as needed for sleep.   venlafaxine  XR 150 MG 24 hr capsule Commonly known as:  EFFEXOR-XR Take 150 mg by mouth at bedtime.   venlafaxine XR 75 MG 24 hr capsule Commonly known as:  EFFEXOR-XR Take 75 mg by mouth at bedtime.   VENTOLIN HFA 108 (90 Base) MCG/ACT inhaler Generic drug:  albuterol Inhale 2 puffs into the lungs every 6 (six) hours as needed for wheezing or shortness of breath.

## 2018-01-16 NOTE — Discharge Summary (Signed)
Physician Discharge Summary  Patient ID: Sonya Gibson MRN: [id number] DOB/AGE: 1987 32 y.o.  Admit date: 12/20/2017 Discharge date: 01/16/2018  Admission Diagnoses: pseudomeningocele   Discharge Diagnoses: same   Discharged Condition: stable  Hospital Course: The patient was admitted on 12/20/2017 and taken to the operating room where the patient underwent lumbar reexploration with repair of CSF leak and placement of lumbar drain. The patient tolerated the procedure well and was taken to the recovery room and then to the ICU in stable condition.  He had a long and complicated hospital course. The lumbar drain remained in place for 8 days, and a CT myelogram suggested no evidence of leak. The lumbar drain was removed. She then once again started to leak spinal fluid from her wound. She was taken back to the operating room for exploration and placement of a lumboperitoneal shunt. At that exploration was found that she had torn through her suture line. The dura was resutured, 's time under much lower pressure, and a lumbar drain was placed. We decided against lumboperitoneal shunting. This remained in place 5 days with flat bed rest. clinically, she did much better at this time. Her drain was removed after 5 days and her wound remained clean dry and intact. She had no headache. She had appropriate soreness but no radicular pain. He was taken off of vancomycin which was used prophylactically on the lumbar drain was in place. The patient remained afebrile with stable vital signs, and tolerated a regular diet. The patient continued to increase activities, and pain was well controlled with oral pain medications.   Consults: None  Significant Diagnostic Studies:  Results for orders placed or performed during the hospital encounter of 12/20/17  Surgical pcr screen  Result Value Ref Range   MRSA, PCR NEGATIVE NEGATIVE   Staphylococcus aureus NEGATIVE NEGATIVE  Culture, Urine  Result Value Ref Range    Specimen Description URINE, CATHETERIZED    Special Requests NONE    Culture      NO GROWTH Performed at [facility], [address], [place], [place] [address]    Report Status 12/26/2017 FINAL   Stat Gram stain  Result Value Ref Range   Specimen Description BACK    Special Requests Normal    Gram Stain      CYTOSPIN SMEAR WBC PRESENT,BOTH PMN AND MONONUCLEAR NO ORGANISMS SEEN Performed at [facility], [address] [place], [place] [address]    Report Status 01/01/2018 FINAL   CSF culture  Result Value Ref Range   Specimen Description CSF    Special Requests NONE    Gram Stain      CYTOSPIN SMEAR WBC PRESENT, PREDOMINANTLY MONONUCLEAR NO ORGANISMS SEEN    Culture      NO GROWTH 3 DAYS Performed at [facility], [address] [place], [place] [address]    Report Status 01/06/2018 FINAL   CBC with Differential/Platelet  Result Value Ref Range   WBC 8.4 4.0 - 10.5 K/uL   RBC 3.99 3.87 - 5.11 MIL/uL   Hemoglobin 11.8 (L) 12.0 - 15.0 g/dL   HCT 52.8 41.3 - 24.4 %   MCV 90.5 78.0 - 100.0 fL   MCH 29.6 26.0 - 34.0 pg   MCHC 32.7 30.0 - 36.0 g/dL   RDW 01.0 27.2 - 53.6 %   Platelets 247 150 - 400 K/uL   Neutrophils Relative % 47 %   Neutro Abs 4.0 1.7 - 7.7 K/uL  Lymphocytes Relative 41 %   Lymphs Abs 3.5 0.7 - 4.0 K/uL   Monocytes Relative 5 %   Monocytes Absolute 0.4 0.1 - 1.0 K/uL   Eosinophils Relative 5 %   Eosinophils Absolute 0.4 0.0 - 0.7 K/uL   Basophils Relative 1 %   Basophils Absolute 0.1 0.0 - 0.1 K/uL   Immature Granulocytes 1 %   Abs Immature Granulocytes 0.1 0.0 - 0.1 K/uL  Basic metabolic panel  Result Value Ref Range   Sodium 138 135 - 145 mmol/L   Potassium 4.0 3.5 - 5.1 mmol/L   Chloride 103 98 - 111 mmol/L   CO2 25 22 - 32 mmol/L   Glucose, Bld 108 (H) 70 - 99 mg/dL   BUN 16 6 - 20 mg/dL   Creatinine, Ser 0.98 0.44 - 1.00 mg/dL   Calcium 9.2 8.9 - 11.9 mg/dL   GFR calc non Af Amer >60 >60 mL/min   GFR calc Af  Amer >60 >60 mL/min   Anion gap 10 5 - 15  hCG, quantitative, pregnancy  Result Value Ref Range   hCG, Beta Chain, Quant, S 1 <5 mIU/mL  Basic metabolic panel  Result Value Ref Range   Sodium 136 135 - 145 mmol/L   Potassium 4.3 3.5 - 5.1 mmol/L   Chloride 101 98 - 111 mmol/L   CO2 26 22 - 32 mmol/L   Glucose, Bld 138 (H) 70 - 99 mg/dL   BUN 8 6 - 20 mg/dL   Creatinine, Ser 1.47 0.44 - 1.00 mg/dL   Calcium 8.1 (L) 8.9 - 10.3 mg/dL   GFR calc non Af Amer >60 >60 mL/min   GFR calc Af Amer >60 >60 mL/min   Anion gap 9 5 - 15  CBC  Result Value Ref Range   WBC 8.7 4.0 - 10.5 K/uL   RBC 3.64 (L) 3.87 - 5.11 MIL/uL   Hemoglobin 10.8 (L) 12.0 - 15.0 g/dL   HCT 82.9 (L) 56.2 - 13.0 %   MCV 92.3 78.0 - 100.0 fL   MCH 29.7 26.0 - 34.0 pg   MCHC 32.1 30.0 - 36.0 g/dL   RDW 86.5 78.4 - 69.6 %   Platelets 303 150 - 400 K/uL  Basic metabolic panel  Result Value Ref Range   Sodium 141 135 - 145 mmol/L   Potassium 3.8 3.5 - 5.1 mmol/L   Chloride 104 98 - 111 mmol/L   CO2 30 22 - 32 mmol/L   Glucose, Bld 154 (H) 70 - 99 mg/dL   BUN 7 6 - 20 mg/dL   Creatinine, Ser 2.95 0.44 - 1.00 mg/dL   Calcium 8.2 (L) 8.9 - 10.3 mg/dL   GFR calc non Af Amer >60 >60 mL/min   GFR calc Af Amer >60 >60 mL/min   Anion gap 7 5 - 15  Vancomycin, trough  Result Value Ref Range   Vancomycin Tr 20 15 - 20 ug/mL  Vancomycin, trough  Result Value Ref Range   Vancomycin Tr 17 15 - 20 ug/mL  Basic metabolic panel  Result Value Ref Range   Sodium 137 135 - 145 mmol/L   Potassium 4.0 3.5 - 5.1 mmol/L   Chloride 103 98 - 111 mmol/L   CO2 27 22 - 32 mmol/L   Glucose, Bld 165 (H) 70 - 99 mg/dL   BUN 7 6 - 20 mg/dL   Creatinine, Ser 2.84 0.44 - 1.00 mg/dL   Calcium 8.4 (L) 8.9 - 10.3 mg/dL  GFR calc non Af Amer >60 >60 mL/min   GFR calc Af Amer >60 >60 mL/min   Anion gap 7 5 - 15  Protein and glucose, CSF  Result Value Ref Range   Glucose, CSF 61 40 - 70 mg/dL   Total  Protein, CSF 119 (H) 15 - 45  mg/dL  CBC  Result Value Ref Range   WBC 7.4 4.0 - 10.5 K/uL   RBC 3.63 (L) 3.87 - 5.11 MIL/uL   Hemoglobin 10.3 (L) 12.0 - 15.0 g/dL   HCT 14.7 (L) 82.9 - 56.2 %   MCV 91.2 78.0 - 100.0 fL   MCH 28.4 26.0 - 34.0 pg   MCHC 31.1 30.0 - 36.0 g/dL   RDW 13.0 86.5 - 78.4 %   Platelets 281 150 - 400 K/uL  CSF cell count with differential  Result Value Ref Range   Tube # COLLECTED IN CUP    Color, CSF STRAW (A) COLORLESS   Appearance, CSF CLOUDY (A) CLEAR   Supernatant XANTHOCHROMIC    RBC Count, CSF 64 (H) 0 /cu mm   WBC, CSF 3,137 (HH) 0 - 5 /cu mm   Segmented Neutrophils-CSF 71 (H) 0 - 6 %   Lymphs, CSF 23 (L) 40 - 80 %   Monocyte-Macrophage-Spinal Fluid 6 (L) 15 - 45 %   Eosinophils, CSF 0 0 - 1 %  Basic metabolic panel  Result Value Ref Range   Sodium 139 135 - 145 mmol/L   Potassium 3.6 3.5 - 5.1 mmol/L   Chloride 104 98 - 111 mmol/L   CO2 27 22 - 32 mmol/L   Glucose, Bld 157 (H) 70 - 99 mg/dL   BUN 6 6 - 20 mg/dL   Creatinine, Ser 6.96 0.44 - 1.00 mg/dL   Calcium 8.5 (L) 8.9 - 10.3 mg/dL   GFR calc non Af Amer >60 >60 mL/min   GFR calc Af Amer >60 >60 mL/min   Anion gap 8 5 - 15  Basic metabolic panel  Result Value Ref Range   Sodium 138 135 - 145 mmol/L   Potassium 3.7 3.5 - 5.1 mmol/L   Chloride 100 98 - 111 mmol/L   CO2 27 22 - 32 mmol/L   Glucose, Bld 162 (H) 70 - 99 mg/dL   BUN 12 6 - 20 mg/dL   Creatinine, Ser 2.95 0.44 - 1.00 mg/dL   Calcium 9.3 8.9 - 28.4 mg/dL   GFR calc non Af Amer >60 >60 mL/min   GFR calc Af Amer >60 >60 mL/min   Anion gap 11 5 - 15  Protein and glucose, CSF  Result Value Ref Range   Glucose, CSF 60 40 - 70 mg/dL   Total  Protein, CSF 67 (H) 15 - 45 mg/dL  CSF cell count with differential  Result Value Ref Range   Tube # LUMBAR SHUNT    Color, CSF COLORLESS COLORLESS   Appearance, CSF CLEAR CLEAR   Supernatant NOT INDICATED    RBC Count, CSF 18 (H) 0 /cu mm   WBC, CSF 300 (HH) 0 - 5 /cu mm   Segmented Neutrophils-CSF 34 (H) 0  - 6 %   Lymphs, CSF 61 40 - 80 %   Monocyte-Macrophage-Spinal Fluid 5 (L) 15 - 45 %  Pathologist smear review  Result Value Ref Range   Path Review Increased neutrophils   Pathologist smear review  Result Value Ref Range   Path Review MIXED INFLAMMATORY CELLS   Vancomycin, trough  Result  Value Ref Range   Vancomycin Tr 24 (HH) 15 - 20 ug/mL  Basic metabolic panel  Result Value Ref Range   Sodium 140 135 - 145 mmol/L   Potassium 3.7 3.5 - 5.1 mmol/L   Chloride 105 98 - 111 mmol/L   CO2 28 22 - 32 mmol/L   Glucose, Bld 180 (H) 70 - 99 mg/dL   BUN 6 6 - 20 mg/dL   Creatinine, Ser 0.98 0.44 - 1.00 mg/dL   Calcium 8.3 (L) 8.9 - 10.3 mg/dL   GFR calc non Af Amer >60 >60 mL/min   GFR calc Af Amer >60 >60 mL/min   Anion gap 7 5 - 15  Vancomycin, trough  Result Value Ref Range   Vancomycin Tr 10 (L) 15 - 20 ug/mL  Vancomycin, trough  Result Value Ref Range   Vancomycin Tr 14 (L) 15 - 20 ug/mL  Basic metabolic panel  Result Value Ref Range   Sodium 140 135 - 145 mmol/L   Potassium 3.9 3.5 - 5.1 mmol/L   Chloride 101 98 - 111 mmol/L   CO2 27 22 - 32 mmol/L   Glucose, Bld 119 (H) 70 - 99 mg/dL   BUN <5 (L) 6 - 20 mg/dL   Creatinine, Ser 1.19 0.44 - 1.00 mg/dL   Calcium 8.6 (L) 8.9 - 10.3 mg/dL   GFR calc non Af Amer >60 >60 mL/min   GFR calc Af Amer >60 >60 mL/min   Anion gap 12 5 - 15  Vancomycin, trough  Result Value Ref Range   Vancomycin Tr 19 15 - 20 ug/mL  I-Stat Beta hCG blood, ED ([facility], [facility], [facility] only)  Result Value Ref Range   I-stat hCG, quantitative 7.4 (H) <5 mIU/mL   Comment 3            Dg Lumbar Spine 2-3 Views  Result Date: 01/08/2018 CLINICAL DATA:  Exploration and placement of lumbar drain. EXAM: DG C-ARM 61-120 MIN; LUMBAR SPINE - 2-3 VIEW Radiation exposure index: 12.98 mGy. COMPARISON:  CT scan of January 04, 2018. FINDINGS: Two intraoperative fluoroscopic images demonstrate surgical probe tip positioned over spinal canal in lower lumbar spine. Exact level  cannot be determined based on images obtained. IMPRESSION: Fluoroscopic guidance provided during lumbar surgery. Electronically Signed   By: Lupita Raider, M.D.   On: 01/08/2018 18:52   Ct Head Wo Contrast  Result Date: 01/01/2018 CLINICAL DATA:  Headache, CSF leak EXAM: CT HEAD WITHOUT CONTRAST TECHNIQUE: Contiguous axial images were obtained from the base of the skull through the vertex without intravenous contrast. COMPARISON:  None. FINDINGS: Brain: No acute intracranial hemorrhage. No focal mass lesion. No CT evidence of acute infarction. No midline shift or mass effect. No hydrocephalus. Basilar cisterns are patent. Vascular: No hyperdense vessel or unexpected calcification. Skull: Normal. Negative for fracture or focal lesion. Sinuses/Orbits: Paranasal sinuses and mastoid air cells are clear. Orbits are clear. Other: None. IMPRESSION: No acute intracranial findings. Electronically Signed   By: Genevive Bi M.D.   On: 01/01/2018 14:13   Ct Lumbar Spine W Contrast  Result Date: 01/04/2018 CLINICAL DATA:  CSF leak, continued assessment. EXAM: LUMBAR MYELOGRAM FLUOROSCOPY TIME:  1 minutes and 1 second corresponding PROCEDURE: After thorough discussion of risks and benefits of the procedure including bleeding, infection, injury to nerves, blood vessels, adjacent structures as well as headache and CSF leak, written and oral informed consent was obtained. Consent was obtained by Dr. Davonna Belling. Time out form was completed. Patient  was positioned prone on the fluoroscopy table. Local anesthesia was provided with 1% lidocaine without epinephrine after prepped and draped in the usual sterile fashion. Puncture was performed at L5-S1 using a 5 inch 22-gauge spinal needle via midline approach. Using a single pass through the dura, the needle was placed within the thecal sac, with return of clear CSF. 15 mL of Isovue M-200 was injected into the thecal sac, with normal opacification of the nerve roots and  cauda equina consistent with free flow within the subarachnoid space. I personally performed the lumbar puncture and administered the intrathecal contrast. I also personally supervised acquisition of the myelogram images. TECHNIQUE: Contiguous axial images were obtained through the Lumbar spine after the intrathecal infusion of infusion. Coronal and sagittal reconstructions were obtained of the axial image sets. COMPARISON:  Lumbar MRI lumbar spine 12/20/2017. Myelogram 12/26/2017. FINDINGS: LUMBAR MYELOGRAM FINDINGS: Good opacification lumbar subarachnoid space. The subarachnoid spaces attenuated over the lower lumbar segments, likely postoperative in nature. No CSF leak is identified either at the site of repair or at the site of the lumbar drain entry. Patient was examined with intermittent fluoroscopy over several minutes in the lateral position, and no CSF leak is observed. CT LUMBAR MYELOGRAM FINDINGS: The patient was taken to CT scan, after supine positioning, to allow for potential egress of CSF if a leak was persistent. There was no CSF leak identified, either at the site of repair, or at the site of lumbar drain entry. There is mild circumferential attenuation of the thecal sac, attributed to epidural lipomatosis, possible subdural CSF, and short pedicles. There is confirmation of subarachnoid contrast, and patency of the lumbar drain established, with contrast in the tubing. Dorsal subcutaneous lower lumbar fluid collection is redemonstrated, estimated 2 x 4 cm, likely seroma. IMPRESSION: LUMBAR MYELOGRAM IMPRESSION: No CSF leak is identified at the time of myelography, with particular attention to the area of surgical repair using lateral fluoroscopy. CT LUMBAR MYELOGRAM IMPRESSION: No CSF leak is identified, either at the surgical repair site or at the lumbar drain entry site. Findings discussed with ordering provider. Electronically Signed   By: Elsie Stain M.D.   On: 01/04/2018 10:50   Ct Lumbar  Spine W Contrast  Result Date: 12/26/2017 CLINICAL DATA:  Previous discectomy L4-5. CSF leak repair. Concern for persistent week. EXAM: LUMBAR MYELOGRAM FLUOROSCOPY TIME:  0 minutes 54 seconds. 1908.03 micro gray meter squared PROCEDURE: After thorough discussion of risks and benefits of the procedure including bleeding, infection, injury to nerves, blood vessels, adjacent structures as well as headache and CSF leak, written and oral informed consent was obtained. Consent was obtained by Dr. Paulina Fusi. Time out form was completed. Patient was positioned prone on the fluoroscopy table. Local anesthesia was provided with 1% lidocaine without epinephrine after prepped and draped in the usual sterile fashion. Puncture was performed at L2-3 using a 6 inch 22-gauge spinal needle via left para median approach. Using a single pass through the dura, the needle was placed within the thecal sac, with return of clear CSF. 12 cc of Isovue 200 was injected into the thecal sac, with normal opacification of the nerve roots and cauda equina consistent with free flow within the subarachnoid space. I personally performed the lumbar puncture and administered the intrathecal contrast. I also personally performed acquisition of the myelogram images. TECHNIQUE: Contiguous axial images were obtained through the Lumbar spine after the intrathecal infusion of infusion. Coronal and sagittal reconstructions were obtained of the axial image sets. COMPARISON:  MRI 12/20/2017. FINDINGS: LUMBAR MYELOGRAM FINDINGS: No abnormality at L3-4 or above. At L4-5, there is mild narrowing of thecal sac. No gross leak is demonstrated at myelography. CT LUMBAR MYELOGRAM FINDINGS: Immediate post myelogram scanning does not demonstrate leaking contrast. Cecal sac is narrowed at the L4-5 level. Fluid is seen posterior to the spinal canal and within the subcutaneous tissues. The patient was brought back 2 hours later for rescanning. At this time, there has  been marked increase in density of the fluid posterior to the surgical level and within the superficial fatty tissues, demonstrating/confirming CSF leak in this location. IMPRESSION: CSF leak confirmed at the operative level. Marked increase in density of the fluid posterior to the spinal canal and within the subcutaneous tissues on the delayed imaging. Electronically Signed   By: Paulina Fusi M.D.   On: 12/26/2017 13:49   Mr Lumbar Spine W Wo Contrast  Result Date: 12/20/2017 CLINICAL DATA:  32 y/o F; severe positional headache. Leaking clear fluid from lumbar wound. Lumbar microdiscectomy on 12/05/2017. EXAM: MRI LUMBAR SPINE WITHOUT AND WITH CONTRAST TECHNIQUE: Multiplanar and multiecho pulse sequences of the lumbar spine were obtained without and with intravenous contrast. CONTRAST:  20mL MULTIHANCE GADOBENATE DIMEGLUMINE 529 MG/ML IV SOLN COMPARISON:  12/08/2017 CT lumbar spine. 11/24/2017 lumbar spine MRI. FINDINGS: Segmentation:  Standard. Alignment:  Physiologic. Vertebrae: No fracture, evidence of discitis, or bone lesion. No abnormal enhancement. Conus medullaris and cauda equina: Conus extends to the T12-L1 level. Fatty filum. No posterior dysraphism. Paraspinal and other soft tissues: Fluid collection within the lumbar subcutaneous fat measuring 4.8 x 3.9 x 2.7 cm (AP x ML x CC series 14, image 7 and series 6, image 19) compatible postoperative seroma. Fluid extends along the right aspect of the L4 spinous process to the right L4-5 hemilaminectomy bed. There are small nonenhancing fluid collections within the posterior extraarachnoid space posterior to the thecal sac extending from the mid L4 level to the S1 level (series 12, image 22). Additionally, there is thin longitudinal and concentric extraarachnoid fluid accumulation throughout the visible spinal canal (series 9, image 7). Disc levels: L1-2: No significant disc displacement, foraminal stenosis, or canal stenosis. L2-3: No significant disc  displacement, foraminal stenosis, or canal stenosis. L3-4: Stable small left foraminal disc protrusion with mild left foraminal stenosis. No significant canal stenosis. L4-5: Interval partial discectomy with resection of the extruded disc fragment and small residual might central protrusion. Epidural fluid collections efface the thecal sac with crowding of the cauda equina. Mild right foraminal stenosis. No significant canal stenosis. L5-S1: Stable central disc protrusion. No significant foraminal or canal stenosis. Epidural fluid accumulation effaces the thecal sac with crowding of the cauda equina. IMPRESSION: 1. Postsurgical changes related to a right L4-5 hemilaminectomy. 2. Extraarachnoid fluid collections posteriorly from the mid L4 to S1 level probably representing a CSF leak. Additionally, there is thin longitudinal and concentric extraarachnoid fluid accumulation throughout the visible spinal canal compatible with intracranial hypotension. No enhancement of fluid collections to suggest inflammation or infection. Electronically Signed   By: Mitzi Hansen M.D.   On: 12/20/2017 07:14   Dg C-arm 1-60 Min  Result Date: 01/08/2018 CLINICAL DATA:  Exploration and placement of lumbar drain. EXAM: DG C-ARM 61-120 MIN; LUMBAR SPINE - 2-3 VIEW Radiation exposure index: 12.98 mGy. COMPARISON:  CT scan of January 04, 2018. FINDINGS: Two intraoperative fluoroscopic images demonstrate surgical probe tip positioned over spinal canal in lower lumbar spine. Exact level cannot be determined based on images obtained. IMPRESSION: Fluoroscopic  guidance provided during lumbar surgery. Electronically Signed   By: Lupita RaiderJames  Green Jr, M.D.   On: 01/08/2018 18:52   Dg Myelography Lumbar Inj Lumbosacral  Result Date: 01/04/2018 CLINICAL DATA:  CSF leak, continued assessment. EXAM: LUMBAR MYELOGRAM FLUOROSCOPY TIME:  1 minutes and 1 second corresponding PROCEDURE: After thorough discussion of risks and benefits of the  procedure including bleeding, infection, injury to nerves, blood vessels, adjacent structures as well as headache and CSF leak, written and oral informed consent was obtained. Consent was obtained by Dr. Davonna BellingJohn Curnes. Time out form was completed. Patient was positioned prone on the fluoroscopy table. Local anesthesia was provided with 1% lidocaine without epinephrine after prepped and draped in the usual sterile fashion. Puncture was performed at L5-S1 using a 5 inch 22-gauge spinal needle via midline approach. Using a single pass through the dura, the needle was placed within the thecal sac, with return of clear CSF. 15 mL of Isovue M-200 was injected into the thecal sac, with normal opacification of the nerve roots and cauda equina consistent with free flow within the subarachnoid space. I personally performed the lumbar puncture and administered the intrathecal contrast. I also personally supervised acquisition of the myelogram images. TECHNIQUE: Contiguous axial images were obtained through the Lumbar spine after the intrathecal infusion of infusion. Coronal and sagittal reconstructions were obtained of the axial image sets. COMPARISON:  Lumbar MRI lumbar spine 12/20/2017. Myelogram 12/26/2017. FINDINGS: LUMBAR MYELOGRAM FINDINGS: Good opacification lumbar subarachnoid space. The subarachnoid spaces attenuated over the lower lumbar segments, likely postoperative in nature. No CSF leak is identified either at the site of repair or at the site of the lumbar drain entry. Patient was examined with intermittent fluoroscopy over several minutes in the lateral position, and no CSF leak is observed. CT LUMBAR MYELOGRAM FINDINGS: The patient was taken to CT scan, after supine positioning, to allow for potential egress of CSF if a leak was persistent. There was no CSF leak identified, either at the site of repair, or at the site of lumbar drain entry. There is mild circumferential attenuation of the thecal sac, attributed  to epidural lipomatosis, possible subdural CSF, and short pedicles. There is confirmation of subarachnoid contrast, and patency of the lumbar drain established, with contrast in the tubing. Dorsal subcutaneous lower lumbar fluid collection is redemonstrated, estimated 2 x 4 cm, likely seroma. IMPRESSION: LUMBAR MYELOGRAM IMPRESSION: No CSF leak is identified at the time of myelography, with particular attention to the area of surgical repair using lateral fluoroscopy. CT LUMBAR MYELOGRAM IMPRESSION: No CSF leak is identified, either at the surgical repair site or at the lumbar drain entry site. Findings discussed with ordering provider. Electronically Signed   By: Elsie StainJohn T Curnes M.D.   On: 01/04/2018 10:50   Dg Myelography Lumbar Inj Lumbosacral  Result Date: 12/26/2017 CLINICAL DATA:  Previous discectomy L4-5. CSF leak repair. Concern for persistent week. EXAM: LUMBAR MYELOGRAM FLUOROSCOPY TIME:  0 minutes 54 seconds. 1908.03 micro gray meter squared PROCEDURE: After thorough discussion of risks and benefits of the procedure including bleeding, infection, injury to nerves, blood vessels, adjacent structures as well as headache and CSF leak, written and oral informed consent was obtained. Consent was obtained by Dr. Paulina FusiMark Shogry. Time out form was completed. Patient was positioned prone on the fluoroscopy table. Local anesthesia was provided with 1% lidocaine without epinephrine after prepped and draped in the usual sterile fashion. Puncture was performed at L2-3 using a 6 inch 22-gauge spinal needle via left para median approach.  Using a single pass through the dura, the needle was placed within the thecal sac, with return of clear CSF. 12 cc of Isovue 200 was injected into the thecal sac, with normal opacification of the nerve roots and cauda equina consistent with free flow within the subarachnoid space. I personally performed the lumbar puncture and administered the intrathecal contrast. I also personally  performed acquisition of the myelogram images. TECHNIQUE: Contiguous axial images were obtained through the Lumbar spine after the intrathecal infusion of infusion. Coronal and sagittal reconstructions were obtained of the axial image sets. COMPARISON:  MRI 12/20/2017. FINDINGS: LUMBAR MYELOGRAM FINDINGS: No abnormality at L3-4 or above. At L4-5, there is mild narrowing of thecal sac. No gross leak is demonstrated at myelography. CT LUMBAR MYELOGRAM FINDINGS: Immediate post myelogram scanning does not demonstrate leaking contrast. Cecal sac is narrowed at the L4-5 level. Fluid is seen posterior to the spinal canal and within the subcutaneous tissues. The patient was brought back 2 hours later for rescanning. At this time, there has been marked increase in density of the fluid posterior to the surgical level and within the superficial fatty tissues, demonstrating/confirming CSF leak in this location. IMPRESSION: CSF leak confirmed at the operative level. Marked increase in density of the fluid posterior to the spinal canal and within the subcutaneous tissues on the delayed imaging. Electronically Signed   By: Paulina Fusi M.D.   On: 12/26/2017 13:49   [place] Ekg Site Rite  Result Date: 01/02/2018 If Site Rite image not attached, placement could not be confirmed due to current cardiac rhythm.  [place] Ekg Site Rite  Result Date: 12/25/2017 If Site Rite image not attached, placement could not be confirmed due to current cardiac rhythm.   Antibiotics:  Anti-infectives (From admission, onward)   Start     Dose/Rate Route Frequency Ordered Stop   01/08/18 1713  vancomycin (VANCOCIN) powder  Status:  Discontinued       As needed 01/08/18 1714 01/08/18 1815   01/08/18 0300  vancomycin (VANCOCIN) 1,750 mg in sodium chloride 0.9 % 500 mL IVPB  Status:  Discontinued     1,750 mg 250 mL/hr over 120 Minutes Intravenous Every 12 hours 01/07/18 1553 01/15/18 0814   01/05/18 0300  vancomycin (VANCOCIN) 1,250 mg in sodium  chloride 0.9 % 250 mL IVPB  Status:  Discontinued     1,250 mg 166.7 mL/hr over 90 Minutes Intravenous Every 12 hours 01/04/18 1631 01/07/18 1553   01/01/18 1215  cefTRIAXone (ROCEPHIN) 2 g in sodium chloride 0.9 % 100 mL IVPB  Status:  Discontinued    Note to Pharmacy:  Pharmacy dosing please   2 g 200 mL/hr over 30 Minutes Intravenous Every 24 hours 01/01/18 1203 01/15/18 0814   12/27/17 1421  vancomycin (VANCOCIN) powder  Status:  Discontinued       As needed 12/27/17 1421 12/27/17 1456   12/27/17 1354  bacitracin 50,000 Units in sodium chloride 0.9 % 500 mL irrigation  Status:  Discontinued       As needed 12/27/17 1355 12/27/17 1456   12/27/17 0800  vancomycin (VANCOCIN) 1,250 mg in sodium chloride 0.9 % 250 mL IVPB  Status:  Discontinued     1,250 mg 166.7 mL/hr over 90 Minutes Intravenous Every 8 hours 12/27/17 0632 01/04/18 1626   12/27/17 0730  ceFAZolin (ANCEF) IVPB 2g/100 mL premix     2 g 200 mL/hr over 30 Minutes Intravenous On call to O.R. 12/27/17 0720 12/27/17 1345   12/25/17 2200  vancomycin (VANCOCIN) 1,500 mg in sodium chloride 0.9 % 500 mL IVPB  Status:  Discontinued     1,500 mg 250 mL/hr over 120 Minutes Intravenous Every 8 hours 12/25/17 1315 12/27/17 0632   12/25/17 1830  vancomycin (VANCOCIN) 1,500 mg in sodium chloride 0.9 % 500 mL IVPB  Status:  Discontinued     1,500 mg 250 mL/hr over 120 Minutes Intravenous Every 8 hours 12/25/17 1009 12/25/17 1316   12/25/17 1400  vancomycin (VANCOCIN) 2,500 mg in sodium chloride 0.9 % 500 mL IVPB     2,500 mg 250 mL/hr over 120 Minutes Intravenous  Once 12/25/17 1315 12/25/17 1700   12/25/17 1030  vancomycin (VANCOCIN) 2,500 mg in sodium chloride 0.9 % 500 mL IVPB  Status:  Discontinued     2,500 mg 250 mL/hr over 120 Minutes Intravenous  Once 12/25/17 1009 12/25/17 1316   12/21/17 1045  fluconazole (DIFLUCAN) tablet 150 mg     150 mg Oral  Once 12/21/17 1037 12/21/17 1143   12/20/17 1945  ceFAZolin (ANCEF) IVPB 2g/100 mL  premix     2 g 200 mL/hr over 30 Minutes Intravenous Every 8 hours 12/20/17 1937 12/21/17 0946   12/20/17 1708  bacitracin 50,000 Units in sodium chloride 0.9 % 500 mL irrigation  Status:  Discontinued       As needed 12/20/17 1708 12/20/17 1737   12/20/17 1000  fluconazole (DIFLUCAN) tablet 150 mg  Status:  Discontinued    Note to Pharmacy:  Take one a day for 7 days     150 mg Oral Daily 12/20/17 0237 12/20/17 0251      Discharge Exam: Blood pressure 109/81, pulse (!) 104, temperature 98.6 F (37 C), temperature source Oral, resp. rate 20, height 5\' 2"  (1.575 m), weight (!) 156.3 kg (344 lb 9.3 oz), SpO2 98 %. Neurologic: Grossly normal Wound clean dry and intact  Discharge Medications:   Allergies as of 01/16/2018      Reactions   Cinnamon Anaphylaxis   (NO "REAL"/TREE BARK CINNAMON)   Coconut Oil Anaphylaxis   Can eat "fake" coconut   Lovenox [enoxaparin] Other (See Comments)   BROKE DOWN THE SKIN AT INJECTION SITE AND CAUSED A WOUND   Nitrofurantoin Hives      Medication List    TAKE these medications   calcium carbonate 750 MG chewable tablet Commonly known as:  TUMS EX Chew 2 tablets by mouth as needed for heartburn.   clonazePAM 2 MG tablet Commonly known as:  KLONOPIN Take 1 tablet (2 mg total) by mouth 2 (two) times daily for 15 doses.   cyclobenzaprine 10 MG tablet Commonly known as:  FLEXERIL Take 1 tablet (10 mg total) by mouth 3 (three) times daily as needed for muscle spasms.   diphenhydrAMINE 25 mg capsule Commonly known as:  BENADRYL Take 50 mg by mouth every 6 (six) hours as needed for sleep.   gabapentin 300 MG capsule Commonly known as:  NEURONTIN Take 2 capsules (600 mg total) by mouth 3 (three) times daily.   ibuprofen 200 MG tablet Commonly known as:  ADVIL,MOTRIN Take 800-1,000 mg by mouth every 6 (six) hours as needed (for pain or headaches).   levothyroxine 50 MCG tablet Commonly known as:  SYNTHROID, LEVOTHROID Take 50 mcg by mouth  daily.   lisinopril-hydrochlorothiazide 10-12.5 MG tablet Commonly known as:  PRINZIDE,ZESTORETIC Take 1 tablet by mouth at bedtime.   magnesium citrate Soln Take 1 Bottle by mouth once as needed for mild constipation or  moderate constipation.   Melatonin 10 MG Tabs Take 10 mg by mouth daily as needed (sleep).   metoCLOPramide 10 MG tablet Commonly known as:  REGLAN Take 1 tablet (10 mg total) by mouth every 6 (six) hours as needed for nausea (or headache).   neomycin-bacitracin-polymyxin Oint Commonly known as:  NEOSPORIN Apply 1 application topically 2 (two) times daily. What changed:    when to take this  additional instructions   ondansetron 8 MG disintegrating tablet Commonly known as:  ZOFRAN ODT Take 1 tablet (8 mg total) by mouth every 8 (eight) hours as needed for up to 20 doses for nausea or vomiting.   oxyCODONE 5 MG immediate release tablet Commonly known as:  Oxy IR/ROXICODONE Take 1 tablet (5 mg total) by mouth every 4 (four) hours as needed (for pain). What changed:  when to take this   promethazine 12.5 MG tablet Commonly known as:  PHENERGAN Take 1 tablet (12.5 mg total) by mouth every 6 (six) hours as needed for refractory nausea / vomiting.   traZODone 150 MG tablet Commonly known as:  DESYREL Take 150 mg by mouth at bedtime as needed for sleep.   venlafaxine XR 150 MG 24 hr capsule Commonly known as:  EFFEXOR-XR Take 150 mg by mouth at bedtime.   venlafaxine XR 75 MG 24 hr capsule Commonly known as:  EFFEXOR-XR Take 75 mg by mouth at bedtime.   VENTOLIN HFA 108 (90 Base) MCG/ACT inhaler Generic drug:  albuterol Inhale 2 puffs into the lungs every 6 (six) hours as needed for wheezing or shortness of breath.       Disposition: home   Final Dx: repair of pseudomeningocele  Discharge Instructions    Call MD for:  difficulty breathing, headache or visual disturbances   Complete by:  As directed    Call MD for:  persistant nausea and  vomiting   Complete by:  As directed    Call MD for:  redness, tenderness, or signs of infection (pain, swelling, redness, odor or green/yellow discharge around incision site)   Complete by:  As directed    Call MD for:  severe uncontrolled pain   Complete by:  As directed    Call MD for:  temperature >100.4   Complete by:  As directed    Diet - low sodium heart healthy   Complete by:  As directed    Increase activity slowly   Complete by:  As directed       Follow-up Information    Tia Alert, MD. Schedule an appointment as soon as possible for a visit in 1 week(s).   Specialty:  Neurosurgery Contact information: [address] [place] [address] [phone]            Signed: Tia Alert 01/16/2018, 7:31 AM

## 2018-01-17 NOTE — Addendum Note (Signed)
Addendum  created 01/17/18 2203 by Kipp BroodJoslin, Bryor Rami, MD   [facility] Staff edited

## 2018-01-18 DIAGNOSIS — F329 Major depressive disorder, single episode, unspecified: Secondary | ICD-10-CM | POA: Diagnosis not present

## 2018-01-18 DIAGNOSIS — E039 Hypothyroidism, unspecified: Secondary | ICD-10-CM | POA: Diagnosis not present

## 2018-01-18 DIAGNOSIS — F419 Anxiety disorder, unspecified: Secondary | ICD-10-CM | POA: Diagnosis not present

## 2018-01-18 DIAGNOSIS — I10 Essential (primary) hypertension: Secondary | ICD-10-CM | POA: Diagnosis not present

## 2018-01-18 DIAGNOSIS — F4310 Post-traumatic stress disorder, unspecified: Secondary | ICD-10-CM | POA: Diagnosis not present

## 2018-01-18 DIAGNOSIS — M5416 Radiculopathy, lumbar region: Secondary | ICD-10-CM | POA: Diagnosis not present

## 2018-01-18 DIAGNOSIS — G970 Cerebrospinal fluid leak from spinal puncture: Secondary | ICD-10-CM | POA: Diagnosis not present

## 2018-01-18 DIAGNOSIS — G8929 Other chronic pain: Secondary | ICD-10-CM | POA: Diagnosis not present

## 2018-02-01 DIAGNOSIS — F329 Major depressive disorder, single episode, unspecified: Secondary | ICD-10-CM | POA: Diagnosis not present

## 2018-02-01 DIAGNOSIS — F419 Anxiety disorder, unspecified: Secondary | ICD-10-CM | POA: Diagnosis not present

## 2018-02-01 DIAGNOSIS — G8929 Other chronic pain: Secondary | ICD-10-CM | POA: Diagnosis not present

## 2018-02-01 DIAGNOSIS — I10 Essential (primary) hypertension: Secondary | ICD-10-CM | POA: Diagnosis not present

## 2018-02-01 DIAGNOSIS — F4310 Post-traumatic stress disorder, unspecified: Secondary | ICD-10-CM | POA: Diagnosis not present

## 2018-02-01 DIAGNOSIS — G970 Cerebrospinal fluid leak from spinal puncture: Secondary | ICD-10-CM | POA: Diagnosis not present

## 2018-02-01 DIAGNOSIS — M5416 Radiculopathy, lumbar region: Secondary | ICD-10-CM | POA: Diagnosis not present

## 2018-02-01 DIAGNOSIS — E039 Hypothyroidism, unspecified: Secondary | ICD-10-CM | POA: Diagnosis not present

## 2018-02-03 ENCOUNTER — Other Ambulatory Visit: Payer: Self-pay

## 2018-02-03 ENCOUNTER — Encounter (HOSPITAL_COMMUNITY): Payer: Self-pay | Admitting: Emergency Medicine

## 2018-02-03 ENCOUNTER — Emergency Department (HOSPITAL_COMMUNITY)
Admission: EM | Admit: 2018-02-03 | Discharge: 2018-02-04 | Disposition: A | Discharge status: Home / Self Care | Payer: BLUE CROSS/BLUE SHIELD | Attending: Emergency Medicine | Admitting: Emergency Medicine

## 2018-02-03 DIAGNOSIS — I10 Essential (primary) hypertension: Secondary | ICD-10-CM | POA: Insufficient documentation

## 2018-02-03 DIAGNOSIS — E039 Hypothyroidism, unspecified: Secondary | ICD-10-CM | POA: Diagnosis not present

## 2018-02-03 DIAGNOSIS — R51 Headache: Secondary | ICD-10-CM | POA: Diagnosis not present

## 2018-02-03 DIAGNOSIS — R519 Headache, unspecified: Secondary | ICD-10-CM

## 2018-02-03 DIAGNOSIS — Z79899 Other long term (current) drug therapy: Secondary | ICD-10-CM | POA: Insufficient documentation

## 2018-02-03 DIAGNOSIS — G43909 Migraine, unspecified, not intractable, without status migrainosus: Secondary | ICD-10-CM | POA: Diagnosis not present

## 2018-02-03 LAB — CBC WITH DIFFERENTIAL/PLATELET
Abs Immature Granulocytes: 0 10*3/uL (ref 0.0–0.1)
BASOS ABS: 0.1 10*3/uL (ref 0.0–0.1)
BASOS PCT: 1 %
EOS ABS: 0.2 10*3/uL (ref 0.0–0.7)
EOS PCT: 2 %
HCT: 39.6 % (ref 36.0–46.0)
Hemoglobin: 12.6 g/dL (ref 12.0–15.0)
Immature Granulocytes: 0 %
LYMPHS PCT: 36 %
Lymphs Abs: 3.2 10*3/uL (ref 0.7–4.0)
MCH: 28.9 pg (ref 26.0–34.0)
MCHC: 31.8 g/dL (ref 30.0–36.0)
MCV: 90.8 fL (ref 78.0–100.0)
MONO ABS: 0.5 10*3/uL (ref 0.1–1.0)
Monocytes Relative: 5 %
Neutro Abs: 4.9 10*3/uL (ref 1.7–7.7)
Neutrophils Relative %: 56 %
PLATELETS: 334 10*3/uL (ref 150–400)
RBC: 4.36 MIL/uL (ref 3.87–5.11)
RDW: 15.9 % — AB (ref 11.5–15.5)
WBC: 8.9 10*3/uL (ref 4.0–10.5)

## 2018-02-03 LAB — COMPREHENSIVE METABOLIC PANEL
ALBUMIN: 3.9 g/dL (ref 3.5–5.0)
ALK PHOS: 77 U/L (ref 38–126)
ALT: 31 U/L (ref 0–44)
ANION GAP: 10 (ref 5–15)
AST: 26 U/L (ref 15–41)
BUN: 6 mg/dL (ref 6–20)
CALCIUM: 9.3 mg/dL (ref 8.9–10.3)
CO2: 27 mmol/L (ref 22–32)
Chloride: 99 mmol/L (ref 98–111)
Creatinine, Ser: 0.9 mg/dL (ref 0.44–1.00)
GFR calc non Af Amer: >60 mL/min (ref >60)
GLUCOSE: 109 mg/dL — AB (ref 70–99)
POTASSIUM: 3.5 mmol/L (ref 3.5–5.1)
SODIUM: 136 mmol/L (ref 135–145)
TOTAL PROTEIN: 7.7 g/dL (ref 6.5–8.1)
Total Bilirubin: 0.5 mg/dL (ref 0.3–1.2)

## 2018-02-03 NOTE — ED Triage Notes (Signed)
Pt c/o constant headache and light sensitivity. Recent back surgery to repair CSF leak. States when her headache is severe, clear fluid comes out of her nose. No drainage at this time. Dr. Rosalia Hammersay aware of pt, verbal order for labs.

## 2018-02-04 ENCOUNTER — Emergency Department (HOSPITAL_COMMUNITY): Payer: BLUE CROSS/BLUE SHIELD

## 2018-02-04 DIAGNOSIS — R51 Headache: Secondary | ICD-10-CM | POA: Diagnosis not present

## 2018-02-04 MED ORDER — SODIUM CHLORIDE 0.9 % IV BOLUS
1000.0000 mL | Freq: Once | INTRAVENOUS | Status: AC
  Administered 2018-02-04: 1000 mL via INTRAVENOUS

## 2018-02-04 MED ORDER — PROCHLORPERAZINE EDISYLATE 10 MG/2ML IJ SOLN
10.0000 mg | Freq: Once | INTRAMUSCULAR | Status: AC
  Administered 2018-02-04: 10 mg via INTRAVENOUS
  Filled 2018-02-04: qty 2

## 2018-02-04 MED ORDER — DIPHENHYDRAMINE HCL 50 MG/ML IJ SOLN
25.0000 mg | Freq: Once | INTRAMUSCULAR | Status: AC
  Administered 2018-02-04: 25 mg via INTRAVENOUS
  Filled 2018-02-04: qty 1

## 2018-02-04 MED ORDER — METOCLOPRAMIDE HCL 5 MG/ML IJ SOLN
10.0000 mg | Freq: Once | INTRAMUSCULAR | Status: AC
  Administered 2018-02-04: 10 mg via INTRAVENOUS
  Filled 2018-02-04: qty 2

## 2018-02-04 NOTE — ED Provider Notes (Signed)
[facility] Provider Note   CSN: [id number] Arrival date & time: 02/03/18  2050     History   Chief Complaint Chief Complaint  Patient presents with  . Headache    HPI Rusty AusHolly E Ackerman is a 32 y.o. female.  32 yo F with a chief complaint of a headache.  Patient has been having complications from her low back surgery.  She had a microdiscectomy and then had a resultant CSF leak.  She had multiple surgeries after which.  Most recently was on 22 July.  She was then sutured closed after a drain was removed.  She had her sutures removed 4 days ago.  Since then she has had a progressive headache that is worse upon sitting up or standing.  She has been trying to improve her symptoms with Fioricet and caffeine tablets and ibuprofen without improvement.  She called her neurosurgeon for pain medicine be called and he suggested she come to the emergency department.  The history is provided by the patient.  Headache   This is a new problem. The current episode started more than 2 days ago. The problem occurs constantly. The problem has been gradually worsening. The headache is associated with bright light (sitting, standing). Pain location: diffuse. The quality of the pain is described as dull. The pain is at a severity of 8/10. The pain is moderate. The pain does not radiate. Pertinent negatives include no fever, no palpitations, no shortness of breath, no nausea and no vomiting. The treatment provided no relief.    Past Medical History:  Diagnosis Date  . Anxiety   . Chronic back pain   . Depression   . Drug-seeking behavior   . Essential hypertension   . GERD (gastroesophageal reflux disease)   . Hypothyroidism   . Lumbar radiculopathy, right   . Migraine headache   . PTSD (post-traumatic stress disorder)     Patient Active Problem List   Diagnosis Date Noted  . CSF leak 12/20/2017  . Pressure injury of skin 12/20/2017  . Lumbar radiculopathy     . S/P lumbar laminectomy 12/05/2017  . Perineal numbness 11/30/2017  . Weakness of right lower extremity 11/17/2017  . Lower back pain 11/15/2017  . Hypothyroidism   . Anxiety   . Essential hypertension   . PTSD (post-traumatic stress disorder)   . Decreased sensation of leg   . GESTATIONAL DIABETES 04/26/2010    Past Surgical History:  Procedure Laterality Date  . ANTERIOR CRUCIATE LIGAMENT REPAIR Left   . BACK SURGERY  12/05/2017  . CESAREAN SECTION    . IR EPIDUROGRAPHY  11/17/2017  . LUMBAR LAMINECTOMY/DECOMPRESSION MICRODISCECTOMY Right 12/05/2017   Procedure: Right L4-5 Microdiskectomy;  Surgeon: Tia AlertJones, David S, MD;  [facility];  Service: Neurosurgery;  Laterality: Right;  . LUMBAR LAMINECTOMY/DECOMPRESSION MICRODISCECTOMY N/A 12/20/2017   Procedure: Repair of CSF Leak;  Surgeon: Tia AlertJones, David S, MD;  [facility];  Service: Neurosurgery;  Laterality: N/A;  . LUMBAR WOUND DEBRIDEMENT N/A 12/27/2017   Procedure: Re-exploration for CSF leak repair and placement of lumbar drain;  Surgeon: Tia AlertJones, David S, MD;  [facility];  Service: Neurosurgery;  Laterality: N/A;  . middle finger reatachment    . PLACEMENT OF LUMBAR DRAIN N/A 12/27/2017   Procedure: PLACEMENT OF LUMBAR DRAIN;  Surgeon: Tia AlertJones, David S, MD;  [facility];  Service: Neurosurgery;  Laterality: N/A;  . VENTRICULOPERITONEAL SHUNT N/A 01/08/2018   Procedure: Re-exploration and repair of previous Lumbar  cerebro-spinal fluid leak and placement of Lumbar drain;  Surgeon: Tia Alert, MD;  Location: [facility] OR;  Service: Neurosurgery;  Laterality: N/A;     OB History   None      Home Medications    Prior to Admission medications   Medication Sig Start Date End Date Taking? Authorizing Provider  albuterol (VENTOLIN HFA) 108 (90 Base) MCG/ACT inhaler Inhale 2 puffs into the lungs every 6 (six) hours as needed for wheezing or shortness of breath.    [provider]  calcium carbonate (TUMS EX) 750 MG  chewable tablet Chew 2 tablets by mouth as needed for heartburn.    [provider]  clonazePAM (KLONOPIN) 2 MG tablet Take 1 tablet (2 mg total) by mouth 2 (two) times daily for 15 doses. 12/06/17 12/20/17  Osvaldo Shipper, MD  cyclobenzaprine (FLEXERIL) 10 MG tablet Take 1 tablet (10 mg total) by mouth 3 (three) times daily as needed for muscle spasms. 12/06/17   Osvaldo Shipper, MD  diphenhydrAMINE (BENADRYL) 25 mg capsule Take 50 mg by mouth every 6 (six) hours as needed for sleep.    [provider]  gabapentin (NEURONTIN) 300 MG capsule Take 2 capsules (600 mg total) by mouth 3 (three) times daily. 11/21/17 12/21/17  Amin, Loura Halt, MD  ibuprofen (ADVIL,MOTRIN) 200 MG tablet Take 800-1,000 mg by mouth every 6 (six) hours as needed (for pain or headaches).     [provider]  levothyroxine (SYNTHROID, LEVOTHROID) 50 MCG tablet Take 50 mcg by mouth daily.  06/06/17   [provider]  lisinopril-hydrochlorothiazide (PRINZIDE,ZESTORETIC) 10-12.5 MG tablet Take 1 tablet by mouth at bedtime.  10/27/17   [provider]  magnesium citrate SOLN Take 1 Bottle by mouth once as needed for mild constipation or moderate constipation.     [provider]  Melatonin 10 MG TABS Take 10 mg by mouth daily as needed (sleep).    [provider]  metoCLOPramide (REGLAN) 10 MG tablet Take 1 tablet (10 mg total) by mouth every 6 (six) hours as needed for nausea (or headache). 12/16/17   Samuel Jester, DO  neomycin-bacitracin-polymyxin (NEOSPORIN) OINT Apply 1 application topically 2 (two) times daily. Patient taking differently: Apply 1 application topically See admin instructions. Apply to wound on stomach two times a day as directed 12/06/17   Osvaldo Shipper, MD  ondansetron (ZOFRAN ODT) 8 MG disintegrating tablet Take 1 tablet (8 mg total) by mouth every 8 (eight) hours as needed for up to 20 doses for nausea or vomiting. 11/21/17   Amin, Loura Halt, MD    oxyCODONE (OXY IR/ROXICODONE) 5 MG immediate release tablet Take 1 tablet (5 mg total) by mouth every 4 (four) hours as needed (for pain). 01/16/18   Tia Alert, MD  promethazine (PHENERGAN) 12.5 MG tablet Take 1 tablet (12.5 mg total) by mouth every 6 (six) hours as needed for refractory nausea / vomiting. 11/21/17   Amin, Loura Halt, MD  traZODone (DESYREL) 150 MG tablet Take 150 mg by mouth at bedtime as needed for sleep.    [provider]  venlafaxine XR (EFFEXOR-XR) 150 MG 24 hr capsule Take 150 mg by mouth at bedtime.  06/06/17 12/20/17  [provider]  venlafaxine XR (EFFEXOR-XR) 75 MG 24 hr capsule Take 75 mg by mouth at bedtime.  12/12/17   [provider]    Family History Family History  Problem Relation Age of Onset  . Diabetes Mellitus II Mother   . Hypertension  Mother   . Seizures Mother   . Hypertension Father   . Diabetes Mellitus II Father   . Hyperlipidemia Father   . Bipolar disorder Sister     Social History Social History   Tobacco Use  . Smoking status: Never Smoker  . Smokeless tobacco: Never Used  Substance Use Topics  . Alcohol use: Yes    Comment: rare  . Drug use: No     Allergies   Cinnamon; Coconut oil; Lovenox [enoxaparin]; and Nitrofurantoin   Review of Systems Review of Systems  Constitutional: Negative for chills and fever.  HENT: Negative for congestion and rhinorrhea.   Eyes: Negative for redness and visual disturbance.  Respiratory: Negative for shortness of breath and wheezing.   Cardiovascular: Negative for chest pain and palpitations.  Gastrointestinal: Negative for nausea and vomiting.  Genitourinary: Negative for dysuria and urgency.  Musculoskeletal: Negative for arthralgias and myalgias.  Skin: Negative for pallor and wound.  Neurological: Positive for headaches. Negative for dizziness.     Physical Exam Updated Vital Signs BP 93/62   Pulse (!) 111   Temp 98.2 F (36.8 C) (Oral)   Resp  16   Ht 5\' 1"  (1.549 m)   Wt (!) 145.2 kg   SpO2 99%   BMI 60.46 kg/m   Physical Exam  Constitutional: She is oriented to person, place, and time. She appears well-developed and well-nourished. No distress.  HENT:  Head: Normocephalic and atraumatic.  Eyes: Pupils are equal, round, and reactive to light. EOM are normal.  Neck: Normal range of motion. Neck supple.  Cardiovascular: Normal rate and regular rhythm. Exam reveals no gallop and no friction rub.  No murmur heard. Pulmonary/Chest: Effort normal. She has no wheezes. She has no rales.  Abdominal: Soft. She exhibits no distension. There is no tenderness.  Musculoskeletal: She exhibits no edema or tenderness.  Neurological: She is alert and oriented to person, place, and time.  Cranial nerves II through XII intact.  Patient has decreased sensation to the right lower extremity from the thigh down.  She has decreased motion with plantarflexion and dorsiflexion.  Pulses intact distally.  Skin: Skin is warm and dry. She is not diaphoretic.  Midline incision over the low back, 2 staples in place some granulation tissue no obvious drainage.  Tender to palpation.  Psychiatric: She has a normal mood and affect. Her behavior is normal.  Nursing note and vitals reviewed.    ED Treatments / Results  Labs (all labs ordered are listed, but only abnormal results are displayed) Labs Reviewed  CBC WITH DIFFERENTIAL/PLATELET - Abnormal; Notable for the following components:      Result Value   RDW 15.9 (*)    All other components within normal limits  COMPREHENSIVE METABOLIC PANEL - Abnormal; Notable for the following components:   Glucose, Bld 109 (*)    All other components within normal limits    EKG None  Radiology Ct Head Wo Contrast  Result Date: 02/04/2018 CLINICAL DATA:  Headache and light sensitivity. Recent back surgery to repair CSF leak. Clear fluid comes out of the nose. EXAM: CT HEAD WITHOUT CONTRAST TECHNIQUE:  Contiguous axial images were obtained from the base of the skull through the vertex without intravenous contrast. COMPARISON:  01/01/2018 FINDINGS: Brain: No evidence of acute infarction, hemorrhage, hydrocephalus, extra-axial collection or mass lesion/mass effect. Vascular: No hyperdense vessel or unexpected calcification. Skull: Normal. Negative for fracture or focal lesion. Sinuses/Orbits: No acute finding. Other: None. IMPRESSION: Negative. Electronically Signed  By: Burman NievesWilliam  Stevens M.D.   On: 02/04/2018 03:25    Procedures Procedures (including critical care time)  Medications Ordered in ED Medications  sodium chloride 0.9 % bolus 1,000 mL (1,000 mLs Intravenous New Bag/Given 02/04/18 0212)  prochlorperazine (COMPAZINE) injection 10 mg (10 mg Intravenous Given 02/04/18 [id number])  diphenhydrAMINE (BENADRYL) injection 25 mg (25 mg Intravenous Given 02/04/18 0212)  metoCLOPramide (REGLAN) injection 10 mg (10 mg Intravenous Given 02/04/18 0240)     Initial Impression / Assessment and Plan / ED Course  I have reviewed the triage vital signs and the nursing notes.  Pertinent labs & imaging results that were available during my care of the patient were reviewed by me and considered in my medical decision making (see chart for details).     32 yo F with a chief complaint of a headache.  This feels similar to when she had her CSF leak out her spinal incision.  I discussed the case with neurosurgery, Dr. Maurice Smallstergard, he recommended doing a CT scan of the head to evaluate for pneumocephalus.  He agreed with giving a headache cocktail.  As I had seen no overt fluid leaking from the wound do not feel the urgent neurosurgical evaluation was required.  CT of the head is negative.  I discussed my conversation with the neurosurgeon with the patient.  She does not feel that her headache is much better but at this point she would prefer to go home and she will try and take her home medications.  She will give a call  to her neurosurgeon on Monday.  She will return to the ED for any worsening.  4:08 AM:  I have discussed the diagnosis/risks/treatment options with the patient and believe the pt to be eligible for discharge home to follow-up with Neurosurgery. We also discussed returning to the ED immediately if new or worsening sx occur. We discussed the sx which are most concerning (e.g., sudden worsening pain, fever, inability to tolerate by mouth) that necessitate immediate return. Medications administered to the patient during their visit and any new prescriptions provided to the patient are listed below.  Medications given during this visit Medications  sodium chloride 0.9 % bolus 1,000 mL (1,000 mLs Intravenous New Bag/Given 02/04/18 0212)  prochlorperazine (COMPAZINE) injection 10 mg (10 mg Intravenous Given 02/04/18 0212)  diphenhydrAMINE (BENADRYL) injection 25 mg (25 mg Intravenous Given 02/04/18 0212)  metoCLOPramide (REGLAN) injection 10 mg (10 mg Intravenous Given 02/04/18 0240)      The patient appears reasonably screen and/or stabilized for discharge and I doubt any other medical condition or other [facility] requiring further screening, evaluation, or treatment in the ED at this time prior to discharge.    Final Clinical Impressions(s) / ED Diagnoses   Final diagnoses:  Bad headache    ED Discharge Orders    None       Melene PlanFloyd, Amnah Breuer, DO 02/04/18 0408

## 2018-02-04 NOTE — ED Notes (Signed)
Patient transported to CT 

## 2018-02-04 NOTE — Discharge Instructions (Signed)
Follow up with your neurosurgeon.  Please return for any worsening symptoms, or new or worsening weakness or numbness

## 2018-02-05 DIAGNOSIS — M5416 Radiculopathy, lumbar region: Secondary | ICD-10-CM | POA: Diagnosis not present

## 2018-02-05 DIAGNOSIS — F419 Anxiety disorder, unspecified: Secondary | ICD-10-CM | POA: Diagnosis not present

## 2018-02-05 DIAGNOSIS — E039 Hypothyroidism, unspecified: Secondary | ICD-10-CM | POA: Diagnosis not present

## 2018-02-05 DIAGNOSIS — F329 Major depressive disorder, single episode, unspecified: Secondary | ICD-10-CM | POA: Diagnosis not present

## 2018-02-05 DIAGNOSIS — G970 Cerebrospinal fluid leak from spinal puncture: Secondary | ICD-10-CM | POA: Diagnosis not present

## 2018-02-05 DIAGNOSIS — I10 Essential (primary) hypertension: Secondary | ICD-10-CM | POA: Diagnosis not present

## 2018-02-05 DIAGNOSIS — G8929 Other chronic pain: Secondary | ICD-10-CM | POA: Diagnosis not present

## 2018-02-05 DIAGNOSIS — F4310 Post-traumatic stress disorder, unspecified: Secondary | ICD-10-CM | POA: Diagnosis not present

## 2018-02-14 DIAGNOSIS — G970 Cerebrospinal fluid leak from spinal puncture: Secondary | ICD-10-CM | POA: Diagnosis not present

## 2018-02-14 DIAGNOSIS — F4310 Post-traumatic stress disorder, unspecified: Secondary | ICD-10-CM | POA: Diagnosis not present

## 2018-02-14 DIAGNOSIS — F419 Anxiety disorder, unspecified: Secondary | ICD-10-CM | POA: Diagnosis not present

## 2018-02-14 DIAGNOSIS — G8929 Other chronic pain: Secondary | ICD-10-CM | POA: Diagnosis not present

## 2018-02-14 DIAGNOSIS — M5416 Radiculopathy, lumbar region: Secondary | ICD-10-CM | POA: Diagnosis not present

## 2018-02-14 DIAGNOSIS — I10 Essential (primary) hypertension: Secondary | ICD-10-CM | POA: Diagnosis not present

## 2018-02-14 DIAGNOSIS — E039 Hypothyroidism, unspecified: Secondary | ICD-10-CM | POA: Diagnosis not present

## 2018-02-14 DIAGNOSIS — F329 Major depressive disorder, single episode, unspecified: Secondary | ICD-10-CM | POA: Diagnosis not present

## 2018-02-20 DIAGNOSIS — L98491 Non-pressure chronic ulcer of skin of other sites limited to breakdown of skin: Secondary | ICD-10-CM | POA: Diagnosis not present

## 2018-02-20 DIAGNOSIS — G960 Cerebrospinal fluid leak: Secondary | ICD-10-CM | POA: Diagnosis not present

## 2018-02-20 DIAGNOSIS — Z79899 Other long term (current) drug therapy: Secondary | ICD-10-CM | POA: Diagnosis not present

## 2018-02-20 DIAGNOSIS — F419 Anxiety disorder, unspecified: Secondary | ICD-10-CM | POA: Diagnosis not present

## 2018-02-20 DIAGNOSIS — R1900 Intra-abdominal and pelvic swelling, mass and lump, unspecified site: Secondary | ICD-10-CM | POA: Diagnosis not present

## 2018-02-20 DIAGNOSIS — G9782 Other postprocedural complications and disorders of nervous system: Secondary | ICD-10-CM | POA: Diagnosis not present

## 2018-02-20 DIAGNOSIS — Y838 Other surgical procedures as the cause of abnormal reaction of the patient, or of later complication, without mention of misadventure at the time of the procedure: Secondary | ICD-10-CM | POA: Diagnosis not present

## 2018-02-20 DIAGNOSIS — L02211 Cutaneous abscess of abdominal wall: Secondary | ICD-10-CM | POA: Diagnosis not present

## 2018-02-20 DIAGNOSIS — L98498 Non-pressure chronic ulcer of skin of other sites with other specified severity: Secondary | ICD-10-CM | POA: Diagnosis not present

## 2018-02-20 DIAGNOSIS — G970 Cerebrospinal fluid leak from spinal puncture: Secondary | ICD-10-CM | POA: Diagnosis not present

## 2018-02-20 DIAGNOSIS — F329 Major depressive disorder, single episode, unspecified: Secondary | ICD-10-CM | POA: Diagnosis not present

## 2018-02-20 DIAGNOSIS — K219 Gastro-esophageal reflux disease without esophagitis: Secondary | ICD-10-CM | POA: Diagnosis not present

## 2018-03-26 ENCOUNTER — Emergency Department (HOSPITAL_COMMUNITY): Payer: BLUE CROSS/BLUE SHIELD

## 2018-03-26 ENCOUNTER — Other Ambulatory Visit: Payer: Self-pay

## 2018-03-26 ENCOUNTER — Emergency Department (HOSPITAL_COMMUNITY)
Admission: EM | Admit: 2018-03-26 | Discharge: 2018-03-26 | Disposition: A | Discharge status: Home / Self Care | Payer: BLUE CROSS/BLUE SHIELD | Attending: Emergency Medicine | Admitting: Emergency Medicine

## 2018-03-26 ENCOUNTER — Encounter (HOSPITAL_COMMUNITY): Payer: Self-pay

## 2018-03-26 DIAGNOSIS — R51 Headache: Secondary | ICD-10-CM | POA: Diagnosis not present

## 2018-03-26 DIAGNOSIS — E039 Hypothyroidism, unspecified: Secondary | ICD-10-CM | POA: Insufficient documentation

## 2018-03-26 DIAGNOSIS — H538 Other visual disturbances: Secondary | ICD-10-CM | POA: Diagnosis not present

## 2018-03-26 DIAGNOSIS — Z79899 Other long term (current) drug therapy: Secondary | ICD-10-CM | POA: Insufficient documentation

## 2018-03-26 DIAGNOSIS — Z8669 Personal history of other diseases of the nervous system and sense organs: Secondary | ICD-10-CM | POA: Insufficient documentation

## 2018-03-26 DIAGNOSIS — I10 Essential (primary) hypertension: Secondary | ICD-10-CM | POA: Diagnosis not present

## 2018-03-26 DIAGNOSIS — G9389 Other specified disorders of brain: Secondary | ICD-10-CM | POA: Diagnosis not present

## 2018-03-26 LAB — CBC WITH DIFFERENTIAL/PLATELET
BASOS PCT: 0 %
Basophils Absolute: 0 10*3/uL (ref 0.0–0.1)
EOS ABS: 0.2 10*3/uL (ref 0.0–0.7)
EOS PCT: 4 %
HCT: 37.6 % (ref 36.0–46.0)
Hemoglobin: 12.1 g/dL (ref 12.0–15.0)
Lymphocytes Relative: 42 %
Lymphs Abs: 2.8 10*3/uL (ref 0.7–4.0)
MCH: 29.2 pg (ref 26.0–34.0)
MCHC: 32.2 g/dL (ref 30.0–36.0)
MCV: 90.6 fL (ref 78.0–100.0)
MONOS PCT: 5 %
Monocytes Absolute: 0.4 10*3/uL (ref 0.1–1.0)
NEUTROS PCT: 49 %
Neutro Abs: 3.3 10*3/uL (ref 1.7–7.7)
Platelets: 325 10*3/uL (ref 150–400)
RBC: 4.15 MIL/uL (ref 3.87–5.11)
RDW: 15.1 % (ref 11.5–15.5)
WBC: 6.7 10*3/uL (ref 4.0–10.5)

## 2018-03-26 LAB — BASIC METABOLIC PANEL
Anion gap: 8 (ref 5–15)
BUN: 8 mg/dL (ref 6–20)
CO2: 25 mmol/L (ref 22–32)
CREATININE: 0.71 mg/dL (ref 0.44–1.00)
Calcium: 8.6 mg/dL — ABNORMAL LOW (ref 8.9–10.3)
Chloride: 105 mmol/L (ref 98–111)
GFR calc Af Amer: >60 mL/min (ref >60)
GFR calc non Af Amer: >60 mL/min (ref >60)
Glucose, Bld: 135 mg/dL — ABNORMAL HIGH (ref 70–99)
Potassium: 3.9 mmol/L (ref 3.5–5.1)
SODIUM: 138 mmol/L (ref 135–145)

## 2018-03-26 LAB — I-STAT BETA HCG BLOOD, ED (MC, WL, AP ONLY)

## 2018-03-26 MED ORDER — DIPHENHYDRAMINE HCL 50 MG/ML IJ SOLN
25.0000 mg | Freq: Once | INTRAMUSCULAR | Status: AC
  Administered 2018-03-26: 25 mg via INTRAVENOUS
  Filled 2018-03-26: qty 1

## 2018-03-26 MED ORDER — ONDANSETRON HCL 4 MG/2ML IJ SOLN
4.0000 mg | Freq: Once | INTRAMUSCULAR | Status: AC
  Administered 2018-03-26: 4 mg via INTRAVENOUS
  Filled 2018-03-26: qty 2

## 2018-03-26 MED ORDER — OXYCODONE-ACETAMINOPHEN 5-325 MG PO TABS: 1.0000 | ORAL_TABLET | ORAL | 0 refills | Status: DC | PRN

## 2018-03-26 MED ORDER — METOCLOPRAMIDE HCL 5 MG/ML IJ SOLN
10.0000 mg | Freq: Once | INTRAMUSCULAR | Status: AC
  Administered 2018-03-26: 10 mg via INTRAVENOUS
  Filled 2018-03-26: qty 2

## 2018-03-26 MED ORDER — MORPHINE SULFATE (PF) 4 MG/ML IV SOLN
4.0000 mg | Freq: Once | INTRAVENOUS | Status: AC
  Administered 2018-03-26: 4 mg via INTRAVENOUS
  Filled 2018-03-26: qty 1

## 2018-03-26 MED ORDER — SODIUM CHLORIDE 0.9 % IV BOLUS
500.0000 mL | Freq: Once | INTRAVENOUS | Status: AC
  Administered 2018-03-26: 500 mL via INTRAVENOUS

## 2018-03-26 MED ORDER — DIPHENHYDRAMINE HCL 50 MG/ML IJ SOLN
INTRAMUSCULAR | Status: AC
  Filled 2018-03-26: qty 1

## 2018-03-26 NOTE — Discharge Instructions (Addendum)
I discussed your case with the eye doctor on call.  Follow-up with him Monday, Tuesday, or Wednesday.  Call office for appointment.  Rest in a quiet dark room.  Prescription for pain medicine.

## 2018-03-26 NOTE — ED Triage Notes (Signed)
Pt reports had surgery in June for herniated disc and had spinal fluid leak.  Reports has had 3 surgeries from June to July to fix the leak but still has spinal headaches.  Pt says she woke up with headache and couldn't see out of left eye.  Pt says she can only see shadows with her left eye.

## 2018-03-26 NOTE — ED Provider Notes (Signed)
[facility] EMERGENCY DEPARTMENT Provider Note   CSN: [id number] Arrival date & time: 03/26/18  0807     History   Chief Complaint Chief Complaint  Patient presents with  . Headache  . vision changes    HPI Sonya Gibson is a 32 y.o. female.  Patient reports blurred vision in the left eye upon upon awakening this morning at 6:30 AM.  She had normal vision when she went to bed last night.  Past medical history includes right microdiscectomy on L4-5 on 12/05/2017.  She had 3 subsequent surgeries to repair a CSF leak.  No fever, sweats, chills, neck pain, neuro deficits, extremity weakness, facial asymmetry, dysphasia.  Nothing makes symptoms better or worse.  Severity is moderate to severe.     Past Medical History:  Diagnosis Date  . Anxiety   . Chronic back pain   . Depression   . Drug-seeking behavior   . Essential hypertension   . GERD (gastroesophageal reflux disease)   . Hypothyroidism   . Lumbar radiculopathy, right   . Migraine headache   . PTSD (post-traumatic stress disorder)     Patient Active Problem List   Diagnosis Date Noted  . CSF leak 12/20/2017  . Pressure injury of skin 12/20/2017  . Lumbar radiculopathy   . S/P lumbar laminectomy 12/05/2017  . Perineal numbness 11/30/2017  . Weakness of right lower extremity 11/17/2017  . Lower back pain 11/15/2017  . Hypothyroidism   . Anxiety   . Essential hypertension   . PTSD (post-traumatic stress disorder)   . Decreased sensation of leg   . GESTATIONAL DIABETES 04/26/2010    Past Surgical History:  Procedure Laterality Date  . ANTERIOR CRUCIATE LIGAMENT REPAIR Left   . BACK SURGERY  12/05/2017  . CESAREAN SECTION    . IR EPIDUROGRAPHY  11/17/2017  . KNEE SURGERY    . LUMBAR LAMINECTOMY/DECOMPRESSION MICRODISCECTOMY Right 12/05/2017   Procedure: Right L4-5 Microdiskectomy;  Surgeon: Tia Alert, MD;  Location: [facility] OR;  Service: Neurosurgery;  Laterality: Right;  . LUMBAR LAMINECTOMY/DECOMPRESSION  MICRODISCECTOMY N/A 12/20/2017   Procedure: Repair of CSF Leak;  Surgeon: Tia Alert, MD;  Location: [facility] OR;  Service: Neurosurgery;  Laterality: N/A;  . LUMBAR WOUND DEBRIDEMENT N/A 12/27/2017   Procedure: Re-exploration for CSF leak repair and placement of lumbar drain;  Surgeon: Tia Alert, MD;  Location: [facility] OR;  Service: Neurosurgery;  Laterality: N/A;  . middle finger reatachment    . PLACEMENT OF LUMBAR DRAIN N/A 12/27/2017   Procedure: PLACEMENT OF LUMBAR DRAIN;  Surgeon: Tia Alert, MD;  Location: [facility] OR;  Service: Neurosurgery;  Laterality: N/A;  . VENTRICULOPERITONEAL SHUNT N/A 01/08/2018   Procedure: Re-exploration and repair of previous Lumbar cerebro-spinal fluid leak and placement of Lumbar drain;  Surgeon: Tia Alert, MD;  Location: [facility] OR;  Service: Neurosurgery;  Laterality: N/A;     OB History   None      Home Medications    Prior to Admission medications   Medication Sig Start Date End Date Taking? Authorizing Provider  albuterol (VENTOLIN HFA) 108 (90 Base) MCG/ACT inhaler Inhale 2 puffs into the lungs every 6 (six) hours as needed for wheezing or shortness of breath.   Yes [provider]  Butalbital-APAP-Caffeine 50-300-40 MG CAPS TAKE 1 CAPSULE BY MOUTH EVERY 4 HOURS AS NEEDED. MAX OF 6 PER 24 HOURS 12/19/17  Yes [provider]  calcium carbonate (TUMS EX) 750 MG chewable tablet Chew 2 tablets by  mouth as needed for heartburn.   Yes [provider]  cyanocobalamin (,VITAMIN B-12,) 1000 MCG/ML injection INJECT 1 ML (CC) INTRAMUSCULARLY EVERY TWO WEEKS 02/16/18  Yes [provider]  cyclobenzaprine (FLEXERIL) 10 MG tablet Take 1 tablet (10 mg total) by mouth 3 (three) times daily as needed for muscle spasms. 12/06/17  Yes Osvaldo Shipper, MD  diphenhydrAMINE (BENADRYL) 25 mg capsule Take 50 mg by mouth every 6 (six) hours as needed for sleep.   Yes [provider]  ibuprofen (ADVIL,MOTRIN) 200 MG tablet Take 800-1,000  mg by mouth every 6 (six) hours as needed (for pain or headaches).    Yes [provider]  levothyroxine (SYNTHROID, LEVOTHROID) 50 MCG tablet Take 50 mcg by mouth daily.  06/06/17  Yes [provider]  lisinopril-hydrochlorothiazide (PRINZIDE,ZESTORETIC) 10-12.5 MG tablet Take 1 tablet by mouth at bedtime.  10/27/17  Yes [provider]  magnesium citrate SOLN Take 1 Bottle by mouth once as needed for mild constipation or moderate constipation.    Yes [provider]  Melatonin 10 MG TABS Take 10 mg by mouth daily as needed (sleep).   Yes [provider]  ondansetron (ZOFRAN ODT) 8 MG disintegrating tablet Take 1 tablet (8 mg total) by mouth every 8 (eight) hours as needed for up to 20 doses for nausea or vomiting. 11/21/17  Yes Amin, Ankit Chirag, MD  oxyCODONE (OXY IR/ROXICODONE) 5 MG immediate release tablet Take 1 tablet (5 mg total) by mouth every 4 (four) hours as needed (for pain). 01/16/18  Yes Tia Alert, MD  traZODone (DESYREL) 150 MG tablet Take 150 mg by mouth at bedtime as needed for sleep.   Yes [provider]  venlafaxine XR (EFFEXOR-XR) 75 MG 24 hr capsule Take 75 mg by mouth at bedtime.  12/12/17  Yes [provider]  clonazePAM (KLONOPIN) 2 MG tablet Take 1 tablet (2 mg total) by mouth 2 (two) times daily for 15 doses. 12/06/17 12/20/17  Osvaldo Shipper, MD  gabapentin (NEURONTIN) 300 MG capsule Take 2 capsules (600 mg total) by mouth 3 (three) times daily. 11/21/17 12/21/17  Amin, Loura Halt, MD  metoCLOPramide (REGLAN) 10 MG tablet Take 1 tablet (10 mg total) by mouth every 6 (six) hours as needed for nausea (or headache). Patient not taking: Reported on 03/26/2018 12/16/17   Samuel Jester, DO  neomycin-bacitracin-polymyxin (NEOSPORIN) OINT Apply 1 application topically 2 (two) times daily. Patient not taking: Reported on 03/26/2018 12/06/17   Osvaldo Shipper, MD  oxyCODONE-acetaminophen (PERCOCET/ROXICET) 5-325 MG tablet  Take 1 tablet by mouth every 4 (four) hours as needed for severe pain. 03/26/18   Donnetta Hutching, MD  promethazine (PHENERGAN) 12.5 MG tablet Take 1 tablet (12.5 mg total) by mouth every 6 (six) hours as needed for refractory nausea / vomiting. Patient not taking: Reported on 03/26/2018 11/21/17   Dimple Nanas, MD  venlafaxine XR (EFFEXOR-XR) 150 MG 24 hr capsule Take 150 mg by mouth at bedtime.  06/06/17 12/20/17  [provider]    Family History Family History  Problem Relation Age of Onset  . Diabetes Mellitus II Mother   . Hypertension Mother   . Seizures Mother   . Hypertension Father   . Diabetes Mellitus II Father   . Hyperlipidemia Father   . Bipolar disorder Sister     Social History Social History   Tobacco Use  . Smoking status: Never Smoker  . Smokeless tobacco: Never Used  Substance Use Topics  . Alcohol use:  Yes    Comment: rare  . Drug use: No     Allergies   Cinnamon; Coconut oil; Lovenox [enoxaparin]; Nitrofurantoin; and Other   Review of Systems Review of Systems  All other systems reviewed and are negative.    Physical Exam Updated Vital Signs BP 108/81   Pulse 98   Temp 98 F (36.7 C) (Oral)   Resp 14   Ht 5\' 1"  (1.549 m)   Wt 135.2 kg   LMP 02/11/2018 (Approximate)   SpO2 97%   BMI 56.31 kg/m   Physical Exam  Constitutional: She is oriented to person, place, and time. She appears well-developed and well-nourished.  HENT:  Head: Normocephalic and atraumatic.  Eyes: Conjunctivae and EOM are normal.  PEARLA.  Fundus examined bilaterally; appears normal.  Grossly abnormal vision in left eye  Neck: Neck supple.  Cardiovascular: Normal rate and regular rhythm.  Pulmonary/Chest: Effort normal and breath sounds normal.  Abdominal: Soft. Bowel sounds are normal.  Musculoskeletal: Normal range of motion.  Neurological: She is alert and oriented to person, place, and time.  Skin: Skin is warm and dry.  Psychiatric: She has a normal  mood and affect. Her behavior is normal.  Nursing note and vitals reviewed.    ED Treatments / Results  Labs (all labs ordered are listed, but only abnormal results are displayed) Labs Reviewed  BASIC METABOLIC PANEL - Abnormal; Notable for the following components:      Result Value   Glucose, Bld 135 (*)    Calcium 8.6 (*)    All other components within normal limits  CBC WITH DIFFERENTIAL/PLATELET  I-STAT BETA HCG BLOOD, ED ([facility], [facility], [facility] ONLY)    EKG None  Radiology Ct Head Wo Contrast  Result Date: 03/26/2018 CLINICAL DATA:  Headaches, blurred vision. EXAM: CT HEAD WITHOUT CONTRAST TECHNIQUE: Contiguous axial images were obtained from the base of the skull through the vertex without intravenous contrast. COMPARISON:  CT scan of February 04, 2018. FINDINGS: Brain: No evidence of acute infarction, hemorrhage, hydrocephalus, extra-axial collection or mass lesion/mass effect. Vascular: No hyperdense vessel or unexpected calcification. Skull: Normal. Negative for fracture or focal lesion. Sinuses/Orbits: No acute finding. Other: None. IMPRESSION: Normal head CT. Electronically Signed   By: Lupita Raider, M.D.   On: 03/26/2018 10:27   Mr Maxine Glenn Head Wo Contrast  Result Date: 03/26/2018 CLINICAL DATA:  Intracranial hypotension following spinal surgery. Visual loss or uveitis/scleritis. Subsequent surgeries to repair CSF leak. Persistent headaches. EXAM: MRI HEAD WITHOUT CONTRAST MRA HEAD WITHOUT CONTRAST TECHNIQUE: Multiplanar, multiecho pulse sequences of the brain and surrounding structures were obtained without intravenous contrast. Angiographic images of the head were obtained using MRA technique without contrast. COMPARISON:  CT head without contrast 03/26/2018 FINDINGS: MRI HEAD FINDINGS Brain: No acute infarct, hemorrhage, or mass lesion is present. The cerebellar tonsils are at the foramen magnum. Adequate CSF space is present anterior to the pons. The findings are not typical of  intracranial hypotension. Sella is normal. No significant white matter disease is present. Internal auditory canals are within normal limits. Brainstem and cerebellum are normal. No significant extraaxial fluid collection is present. The ventricles are of normal size. Vascular: Flow is present in the major intracranial arteries. Skull and upper cervical spine: The skull base is within normal limits. 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 Craniocervical junction is normal. Upper cervical spine is within normal limits. Sinuses/Orbits: The paranasal sinuses and mastoid air cells are clear. The globes and orbits are within normal  limits. MRA HEAD FINDINGS The time-of-flight images mildly degraded by patient motion. Internal carotid arteries are within normal limits from the high cervical segments through the ICA termini bilaterally. The A1 and M1 segments are normal. Anterior communicating artery is patent. The A1 and M1 segments are normal. ACA and MCA branch vessels are unremarkable. The left vertebral artery is dominant. PICA origins are visualized and normal. Basilar artery is normal. Both posterior cerebral arteries originate from the basilar tip. PCA branch vessels are unremarkable. IMPRESSION: 1. Normal MRI appearance the brain. No evidence for intracranial hypotension. 2. Normal variant MRA circle-of-Willis. No significant proximal stenosis, aneurysm, or branch vessel occlusion. Electronically Signed   By: Marin Roberts M.D.   On: 03/26/2018 11:40   Mr Brain Wo Contrast  Result Date: 03/26/2018 CLINICAL DATA:  Intracranial hypotension following spinal surgery. Visual loss or uveitis/scleritis. Subsequent surgeries to repair CSF leak. Persistent headaches. EXAM: MRI HEAD WITHOUT CONTRAST MRA HEAD WITHOUT CONTRAST TECHNIQUE: Multiplanar, multiecho pulse sequences of the brain and surrounding structures were obtained without intravenous contrast. Angiographic images of the head were obtained using  MRA technique without contrast. COMPARISON:  CT head without contrast 03/26/2018 FINDINGS: MRI HEAD FINDINGS Brain: No acute infarct, hemorrhage, or mass lesion is present. The cerebellar tonsils are at the foramen magnum. Adequate CSF space is present anterior to the pons. The findings are not typical of intracranial hypotension. Sella is normal. No significant white matter disease is present. Internal auditory canals are within normal limits. Brainstem and cerebellum are normal. No significant extraaxial fluid collection is present. The ventricles are of normal size. Vascular: Flow is present in the major intracranial arteries. Skull and upper cervical spine: The skull base is within normal limits. 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 Craniocervical junction is normal. Upper cervical spine is within normal limits. Sinuses/Orbits: The paranasal sinuses and mastoid air cells are clear. The globes and orbits are within normal limits. MRA HEAD FINDINGS The time-of-flight images mildly degraded by patient motion. Internal carotid arteries are within normal limits from the high cervical segments through the ICA termini bilaterally. The A1 and M1 segments are normal. Anterior communicating artery is patent. The A1 and M1 segments are normal. ACA and MCA branch vessels are unremarkable. The left vertebral artery is dominant. PICA origins are visualized and normal. Basilar artery is normal. Both posterior cerebral arteries originate from the basilar tip. PCA branch vessels are unremarkable. IMPRESSION: 1. Normal MRI appearance the brain. No evidence for intracranial hypotension. 2. Normal variant MRA circle-of-Willis. No significant proximal stenosis, aneurysm, or branch vessel occlusion. Electronically Signed   By: Marin Roberts M.D.   On: 03/26/2018 11:40    Procedures Procedures (including critical care time)  Medications Ordered in ED Medications  sodium chloride 0.9 % bolus 500 mL (0 mLs  Intravenous Stopped 03/26/18 1030)  metoCLOPramide (REGLAN) injection 10 mg (10 mg Intravenous Given 03/26/18 0947)  diphenhydrAMINE (BENADRYL) injection 25 mg (25 mg Intravenous Given 03/26/18 0947)  ondansetron (ZOFRAN) injection 4 mg (4 mg Intravenous Given 03/26/18 1137)  morphine 4 MG/ML injection 4 mg (4 mg Intravenous Given 03/26/18 1137)  morphine 4 MG/ML injection 4 mg (4 mg Intravenous Given 03/26/18 1327)     Initial Impression / Assessment and Plan / ED Course  I have reviewed the triage vital signs and the nursing notes.  Pertinent labs & imaging results that were available during my care of the patient were reviewed by me and considered in my medical decision making (see chart for  details).     Patient reports blurred vision in left eye.  Normal neurological exam with the exception of visual acuity.  Horner's syndrome/carotid dissection considered, but not consistent with clinical findings.  IV pain meds given.  This helped symptoms somewhat.  Discussed with ophthalmologist on-call Dr.Scott Groat.  He will evaluate patient in the office  Final Clinical Impressions(s) / ED Diagnoses   Final diagnoses:  Blurred vision, left eye    ED Discharge Orders         Ordered    oxyCODONE-acetaminophen (PERCOCET/ROXICET) 5-325 MG tablet  Every 4 hours PRN     03/26/18 1404           Donnetta Hutching, MD 03/26/18 1409

## 2018-03-28 ENCOUNTER — Encounter (HOSPITAL_COMMUNITY): Payer: Self-pay | Admitting: Emergency Medicine

## 2018-03-28 ENCOUNTER — Emergency Department (HOSPITAL_COMMUNITY)
Admission: EM | Admit: 2018-03-28 | Discharge: 2018-03-29 | Disposition: A | Discharge status: Home / Self Care | Payer: BLUE CROSS/BLUE SHIELD | Attending: Emergency Medicine | Admitting: Emergency Medicine

## 2018-03-28 ENCOUNTER — Other Ambulatory Visit: Payer: Self-pay

## 2018-03-28 ENCOUNTER — Emergency Department (HOSPITAL_COMMUNITY): Payer: BLUE CROSS/BLUE SHIELD

## 2018-03-28 DIAGNOSIS — E039 Hypothyroidism, unspecified: Secondary | ICD-10-CM | POA: Diagnosis not present

## 2018-03-28 DIAGNOSIS — H469 Unspecified optic neuritis: Secondary | ICD-10-CM | POA: Diagnosis not present

## 2018-03-28 DIAGNOSIS — G43109 Migraine with aura, not intractable, without status migrainosus: Secondary | ICD-10-CM | POA: Diagnosis not present

## 2018-03-28 DIAGNOSIS — I10 Essential (primary) hypertension: Secondary | ICD-10-CM | POA: Insufficient documentation

## 2018-03-28 DIAGNOSIS — Z79899 Other long term (current) drug therapy: Secondary | ICD-10-CM | POA: Insufficient documentation

## 2018-03-28 DIAGNOSIS — G43809 Other migraine, not intractable, without status migrainosus: Secondary | ICD-10-CM | POA: Diagnosis not present

## 2018-03-28 DIAGNOSIS — H538 Other visual disturbances: Secondary | ICD-10-CM | POA: Diagnosis not present

## 2018-03-28 DIAGNOSIS — H5700 Unspecified anomaly of pupillary function: Secondary | ICD-10-CM | POA: Diagnosis not present

## 2018-03-28 DIAGNOSIS — R51 Headache: Secondary | ICD-10-CM | POA: Diagnosis not present

## 2018-03-28 LAB — BASIC METABOLIC PANEL
ANION GAP: 11 (ref 5–15)
BUN: 7 mg/dL (ref 6–20)
CO2: 22 mmol/L (ref 22–32)
Calcium: 9 mg/dL (ref 8.9–10.3)
Chloride: 105 mmol/L (ref 98–111)
Creatinine, Ser: 0.69 mg/dL (ref 0.44–1.00)
GFR calc Af Amer: >60 mL/min (ref >60)
GFR calc non Af Amer: >60 mL/min (ref >60)
GLUCOSE: 142 mg/dL — AB (ref 70–99)
POTASSIUM: 3.6 mmol/L (ref 3.5–5.1)
Sodium: 138 mmol/L (ref 135–145)

## 2018-03-28 LAB — CBC WITH DIFFERENTIAL/PLATELET
Abs Immature Granulocytes: 0.03 10*3/uL (ref 0.00–0.07)
Basophils Absolute: 0.1 10*3/uL (ref 0.0–0.1)
Basophils Relative: 1 %
Eosinophils Absolute: 0.2 10*3/uL (ref 0.0–0.5)
Eosinophils Relative: 3 %
HCT: 42.2 % (ref 36.0–46.0)
Hemoglobin: 12.5 g/dL (ref 12.0–15.0)
IMMATURE GRANULOCYTES: 0 %
Lymphocytes Relative: 43 %
Lymphs Abs: 3.3 10*3/uL (ref 0.7–4.0)
MCH: 27.6 pg (ref 26.0–34.0)
MCHC: 29.6 g/dL — ABNORMAL LOW (ref 30.0–36.0)
MCV: 93.2 fL (ref 80.0–100.0)
MONO ABS: 0.3 10*3/uL (ref 0.1–1.0)
MONOS PCT: 4 %
NEUTROS ABS: 3.7 10*3/uL (ref 1.7–7.7)
NEUTROS PCT: 49 %
PLATELETS: 377 10*3/uL (ref 150–400)
RBC: 4.53 MIL/uL (ref 3.87–5.11)
RDW: 14.5 % (ref 11.5–15.5)
WBC: 7.6 10*3/uL (ref 4.0–10.5)
nRBC: 0 % (ref 0.0–0.2)

## 2018-03-28 LAB — I-STAT BETA HCG BLOOD, ED (MC, WL, AP ONLY): I-stat hCG, quantitative: <5 m[IU]/mL (ref <5)

## 2018-03-28 MED ORDER — GADOBUTROL 1 MMOL/ML IV SOLN
10.0000 mL | Freq: Once | INTRAVENOUS | Status: AC | PRN
  Administered 2018-03-28: 10 mL via INTRAVENOUS

## 2018-03-28 NOTE — ED Notes (Signed)
Pt complaining vision and pain are worsening. Cannot see out left eye 'at all'.

## 2018-03-28 NOTE — ED Triage Notes (Signed)
Pt reports vision loss in left eye on Monday and an intermittent headache since then. Pt's doctor sent her here for MRI of brain and orbits w/ contrast and evaluate for left retrobulbar Optic neuritis. Pt reports nausea and stiff neck pain. Allergic to migraine cocktail.

## 2018-03-28 NOTE — ED Provider Notes (Signed)
MSE was initiated and I personally evaluated the patient and placed orders (if any) at  7:32 PM on March 28, 2018.  The patient appears stable so that the remainder of the MSE may be completed by another provider.  Patient placed in Quick Look pathway, seen and evaluated   Chief Complaint: left vision loss, eye pain x2 days  HPI:   32 year old female presents to ED for 2-day history of decreased vision in her left eye.  Symptoms began when she woke up Monday morning.  She reports only seeing gray or black shadows since then.  She was seen and evaluated any pain in the ED with negative MRI and CT of the head.  She was referred to ophthalmology she saw prior to arrival here.  She was sent back to the ED to have MRI of the brain and orbits done to rule out concern for retrobulbar optic neuritis.  He denies any family history of MS.  She cannot recall any inciting event that may have triggered her symptoms.  Of note, patient had L4-5 right microdiscectomy on 12/05/2017.  She has had 2 subsequent surgeries since then to repair a CSF leak.  ROS: Loss of vision (one)  Physical Exam:   Gen: No distress  Neuro: Awake and Alert  Skin: Warm    Focused Exam: PERRL. Pain with eye movements. No facial asymmetry noted. Tachycardic.Marland Kitchen Anxious.  7:35 PM Spoke to Dr. Laurence Slate of neurology regarding findings and appropriate imaging to order.  He recommends MRI of the brain and orbits with/without contrast.   Initiation of care has begun. The patient has been counseled on the process, plan, and necessity for staying for the completion/evaluation, and the remainder of the medical screening examination    Dietrich Pates, PA-C 03/28/18 1936    Melene Plan, DO 03/29/18 0000

## 2018-03-29 DIAGNOSIS — G43109 Migraine with aura, not intractable, without status migrainosus: Secondary | ICD-10-CM

## 2018-03-29 MED ORDER — DIPHENHYDRAMINE HCL 50 MG/ML IJ SOLN
25.0000 mg | Freq: Once | INTRAMUSCULAR | Status: AC
  Administered 2018-03-29: 25 mg via INTRAVENOUS
  Filled 2018-03-29: qty 1

## 2018-03-29 MED ORDER — VALPROATE SODIUM 500 MG/5ML IV SOLN
500.0000 mg | INTRAVENOUS | Status: DC
  Filled 2018-03-29: qty 5

## 2018-03-29 MED ORDER — PROCHLORPERAZINE EDISYLATE 10 MG/2ML IJ SOLN
10.0000 mg | Freq: Once | INTRAMUSCULAR | Status: AC
  Administered 2018-03-29: 10 mg via INTRAVENOUS
  Filled 2018-03-29: qty 2

## 2018-03-29 MED ORDER — SODIUM CHLORIDE 0.9 % IV BOLUS
1000.0000 mL | Freq: Once | INTRAVENOUS | Status: AC
  Administered 2018-03-29: 1000 mL via INTRAVENOUS

## 2018-03-29 MED ORDER — KETOROLAC TROMETHAMINE 30 MG/ML IJ SOLN
30.0000 mg | Freq: Once | INTRAMUSCULAR | Status: AC
  Administered 2018-03-29: 30 mg via INTRAVENOUS
  Filled 2018-03-29: qty 1

## 2018-03-29 NOTE — Consult Note (Signed)
Requesting Physician: Dr. Blinda Leatherwood    Chief Complaint:  Left vision loss   History obtained from: Patient and Chart  HPI:                                                                                                                                       Sonya Gibson is an 32 y.o. female past medical history of migraines, L4-L5 laminectomy CSF leak, obesity, depression, and hypertension presents to the emergency room with 3-day history of sudden onset loss of vision.  Patient upon waking up on 10/ 7 noticed that she was not able to see from her left eye.  This is associated with a headache and pain on moving her eyes.  She was seen at [facility], MRI brain and MRA were normal.  She was treated with a migraine cocktail which helped relieve some of the headache.  Her vision improved slightly to being able to see shadows and some color.   She was seen by the ophthalmologist today who noted that she had a right afferent pupillary defect in the left eye.  No papilledema was noted.  She was sent to [facility], ER for MRI brain with contrast as well as MRI orbits with contrast due to concern for retrobulbar optic neuritis.  She does have a history of migraines, he did with throbbing headache, nausea.  Sometimes associated with aura usually smell.  She has never had visual blurring in the past.   Past Medical History:  Diagnosis Date  . Anxiety   . Chronic back pain   . Depression   . Drug-seeking behavior   . Essential hypertension   . GERD (gastroesophageal reflux disease)   . Hypothyroidism   . Lumbar radiculopathy, right   . Migraine headache   . PTSD (post-traumatic stress disorder)     Past Surgical History:  Procedure Laterality Date  . ANTERIOR CRUCIATE LIGAMENT REPAIR Left   . BACK SURGERY  12/05/2017  . CESAREAN SECTION    . IR EPIDUROGRAPHY  11/17/2017  . KNEE SURGERY    . LUMBAR LAMINECTOMY/DECOMPRESSION MICRODISCECTOMY Right 12/05/2017   Procedure: Right L4-5  Microdiskectomy;  Surgeon: Tia Alert, MD;  Location: [facility] OR;  Service: Neurosurgery;  Laterality: Right;  . LUMBAR LAMINECTOMY/DECOMPRESSION MICRODISCECTOMY N/A 12/20/2017   Procedure: Repair of CSF Leak;  Surgeon: Tia Alert, MD;  Location: [facility] OR;  Service: Neurosurgery;  Laterality: N/A;  . LUMBAR WOUND DEBRIDEMENT N/A 12/27/2017   Procedure: Re-exploration for CSF leak repair and placement of lumbar drain;  Surgeon: Tia Alert, MD;  Location: [facility] OR;  Service: Neurosurgery;  Laterality: N/A;  . middle finger reatachment    . PLACEMENT OF LUMBAR DRAIN N/A 12/27/2017   Procedure: PLACEMENT OF LUMBAR DRAIN;  Surgeon: Tia Alert, MD;  Location: [facility] OR;  Service: Neurosurgery;  Laterality: N/A;  . VENTRICULOPERITONEAL SHUNT N/A 01/08/2018   Procedure:  Re-exploration and repair of previous Lumbar cerebro-spinal fluid leak and placement of Lumbar drain;  Surgeon: Tia Alert, MD;  Location: [facility] OR;  Service: Neurosurgery;  Laterality: N/A;    Family History  Problem Relation Age of Onset  . Diabetes Mellitus II Mother   . Hypertension Mother   . Seizures Mother   . Hypertension Father   . Diabetes Mellitus II Father   . Hyperlipidemia Father   . Bipolar disorder Sister    Social History:  reports that she has never smoked. She has never used smokeless tobacco. She reports that she drinks alcohol. She reports that she does not use drugs.  Allergies:  Allergies  Allergen Reactions  . Cinnamon Anaphylaxis    (NO "REAL"/TREE BARK CINNAMON)  . Coconut Oil Anaphylaxis    Can eat "fake" coconut  . Lovenox [Enoxaparin] Other (See Comments)    BROKE DOWN THE SKIN AT INJECTION SITE AND CAUSED A WOUND  . Nitrofurantoin Hives  . Other     "Headache cocktail"   Blister to abd    Medications:                                                                                                                        I reviewed home medications   ROS:                                                                                                                                      14 systems reviewed and negative except above    Examination:                                                                                                      General: Appears well-developed and well-nourished.  Psych: Affect appropriate to situation Eyes: No scleral injection HENT: No OP obstrucion Head: Normocephalic.  Cardiovascular: Normal rate and regular rhythm.  Respiratory: Effort normal and breath sounds normal to anterior ascultation GI: Soft.  No distension.  There is no tenderness.  Skin: WDI    Neurological Examination Mental Status: Alert, oriented, thought content appropriate.  Speech fluent without evidence of aphasia. Able to follow 3 step commands without difficulty. 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 V,VII: smile symmetric, facial light touch sensation normal bilaterally VIII: hearing normal bilaterally IX,X: uvula rises symmetrically XI: bilateral shoulder shrug XII: midline tongue extension Motor: Right : Upper extremity   5/5    Left:     Upper extremity   5/5  Lower extremity   5/5     Lower extremity   5/5 Tone and bulk:normal tone throughout; no atrophy noted Sensory: Pinprick and light touch intact throughout, bilaterally Deep Tendon Reflexes: 2+ and symmetric throughout Plantars: Right: downgoing   Left: downgoing Cerebellar: normal finger-to-nose, normal rapid alternating movements and normal heel-to-shin test Gait: normal gait and station     Lab Results: Basic Metabolic Panel: Recent Labs  Lab 03/26/18 0932 03/28/18 1939  NA 138 138  K 3.9 3.6  CL 105 105  CO2 25 22  GLUCOSE 135* 142*  BUN 8 7  CREATININE 0.71 0.69  CALCIUM 8.6* 9.0    CBC: Recent Labs  Lab  03/26/18 0932 03/28/18 1939  WBC 6.7 7.6  NEUTROABS 3.3 3.7  HGB 12.1 12.5  HCT 37.6 42.2  MCV 90.6 93.2  PLT 325 377    Coagulation Studies: No results for input(s): LABPROT, INR in the last 72 hours.  Imaging: Mr Laqueta Jean And Wo Contrast  Result Date: 03/29/2018 CLINICAL DATA:  Decreased left eye vision. EXAM: MRI HEAD AND ORBITS WITHOUT AND WITH CONTRAST TECHNIQUE: Multiplanar, multiecho pulse sequences of the brain and surrounding structures were obtained without and with intravenous contrast. Multiplanar, multiecho pulse sequences of the orbits and surrounding structures were obtained including fat saturation techniques, before and after intravenous contrast administration. CONTRAST:  10 mL Gadavist COMPARISON:  Brain MRI/MRA 03/26/2018 FINDINGS: MRI HEAD FINDINGS BRAIN: There is no acute infarct, acute hemorrhage, hydrocephalus or extra-axial collection. The midline structures are normal. No midline shift or other mass effect. There are no old infarcts. The white matter signal is normal for the patient's age. The cerebral and cerebellar volume are age-appropriate. Susceptibility-sensitive sequences show no chronic microhemorrhage or superficial siderosis. No abnormal contrast enhancement. VASCULAR: Major intracranial arterial and venous sinus flow voids are normal. SKULL AND UPPER CERVICAL SPINE: Calvarial bone marrow signal is normal. There is no skull base mass. Visualized upper cervical spine and soft tissues are normal. MRI ORBITS FINDINGS Orbits: --Globes: Normal. --Bony orbit: Normal. --Preseptal soft tissues: Normal. --Intra- and extraconal orbital fat: Normal. No inflammatory stranding. --Optic nerves: Normal. --Lacrimal glands and fossae: Normal. --Extraocular muscles: Normal. Visualized sinuses:  No fluid levels or advanced mucosal thickening. Soft tissues: Normal. Limited intracranial: Normal. IMPRESSION: Normal MRI of the brain and orbits. Electronically Signed   By: Deatra Robinson  M.D.   On: 03/29/2018 00:09   Mr Rockwell [place] ZO Contrast  Result Date: 03/29/2018 CLINICAL DATA:  Decreased left eye vision. EXAM: MRI HEAD AND ORBITS WITHOUT AND WITH CONTRAST TECHNIQUE: Multiplanar, multiecho pulse sequences of the brain and surrounding structures were obtained without and with intravenous contrast. Multiplanar, multiecho pulse sequences of the orbits and surrounding structures were obtained including fat saturation techniques, before and after intravenous contrast administration. CONTRAST:  10 mL Gadavist COMPARISON:  Brain MRI/MRA 03/26/2018 FINDINGS: MRI HEAD FINDINGS BRAIN: There is no acute infarct, acute hemorrhage, hydrocephalus or extra-axial collection. The midline structures are normal. No midline shift or other mass effect. There are no old infarcts. The white matter signal is normal for the patient's age. The cerebral and cerebellar volume are age-appropriate. Susceptibility-sensitive sequences show no chronic microhemorrhage or superficial siderosis. No abnormal contrast enhancement. VASCULAR: Major intracranial arterial and venous sinus flow voids are normal. SKULL AND UPPER CERVICAL SPINE: Calvarial bone marrow signal is normal. There is no skull base mass. Visualized upper cervical spine and soft tissues are normal. MRI ORBITS FINDINGS Orbits: --Globes: Normal. --Bony orbit: Normal. --Preseptal soft tissues: Normal. --Intra- and extraconal orbital fat: Normal. No inflammatory stranding. --Optic nerves: Normal. --Lacrimal glands and fossae: Normal. --Extraocular muscles: Normal. Visualized sinuses:  No fluid levels or advanced mucosal thickening. Soft tissues: Normal. Limited intracranial: Normal. IMPRESSION: Normal MRI of the brain and orbits. Electronically Signed   By: Deatra Robinson M.D.   On: 03/29/2018 00:09     I have reviewed the above imaging : MRI brain and orbits   ASSESSMENT AND PLAN   32 y.o. female past medical history of migraines, L4-L5 laminectomy CSF  leak, obesity, depression, and hypertension presents to the emergency room with 3-day history of sudden onset loss of vision associated with pain.   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  However, MRI brain with contrast as well as MRI orbits obtained does not show any enhancement  -making optic neuritis unlikely.  Given history of migraines, I would recommend treating her again with a migraine cocktail.  If she does not improve then she will need to be admitted for IV steroids.  Complex Migraine - MRI Brain w/wo contrast and orbits negative for optic nerve enhancement, - recommend Migraine cocktail, consider Valproic acid if not better   Addendum Symptoms completely resolved, discharged from ED Outpatient neurology follow up, consider Topamax for maintenance therapy   Sushanth Aroor [facility] Pager Number [phone]

## 2018-03-29 NOTE — ED Provider Notes (Addendum)
[facility] EMERGENCY DEPARTMENT Provider Note   CSN: [id number] Arrival date & time: 03/28/18  1806     History   Chief Complaint Chief Complaint  Patient presents with  . Loss of Vision  . Headache    HPI Sonya Gibson is a 32 y.o. female.  Patient referred back to the emergency department by Dr. Dione Booze, ophthalmology.  Patient was seen in the ER 2 days ago with headache, decreased vision in her left eye.  She had an MRI during the visit which did not show any acute abnormality.  She followed up with Dr. Dione Booze today.  During the visit his examination revealed a relative afferent pupillary defect, he became concerned about the possibility of optic neuritis, sent her to the ER for further imaging.  Patient reports that she has pain behind her left eye.  Initially, 2 days ago, she had absolutely no vision out of the left eye, now she can see "shadows"     Past Medical History:  Diagnosis Date  . Anxiety   . Chronic back pain   . Depression   . Drug-seeking behavior   . Essential hypertension   . GERD (gastroesophageal reflux disease)   . Hypothyroidism   . Lumbar radiculopathy, right   . Migraine headache   . PTSD (post-traumatic stress disorder)     Patient Active Problem List   Diagnosis Date Noted  . CSF leak 12/20/2017  . Pressure injury of skin 12/20/2017  . Lumbar radiculopathy   . S/P lumbar laminectomy 12/05/2017  . Perineal numbness 11/30/2017  . Weakness of right lower extremity 11/17/2017  . Lower back pain 11/15/2017  . Hypothyroidism   . Anxiety   . Essential hypertension   . PTSD (post-traumatic stress disorder)   . Decreased sensation of leg   . GESTATIONAL DIABETES 04/26/2010    Past Surgical History:  Procedure Laterality Date  . ANTERIOR CRUCIATE LIGAMENT REPAIR Left   . BACK SURGERY  12/05/2017  . CESAREAN SECTION    . IR EPIDUROGRAPHY  11/17/2017  . KNEE SURGERY    . LUMBAR LAMINECTOMY/DECOMPRESSION MICRODISCECTOMY  Right 12/05/2017   Procedure: Right L4-5 Microdiskectomy;  Surgeon: Tia Alert, MD;  Location: [facility] OR;  Service: Neurosurgery;  Laterality: Right;  . LUMBAR LAMINECTOMY/DECOMPRESSION MICRODISCECTOMY N/A 12/20/2017   Procedure: Repair of CSF Leak;  Surgeon: Tia Alert, MD;  Location: [facility] OR;  Service: Neurosurgery;  Laterality: N/A;  . LUMBAR WOUND DEBRIDEMENT N/A 12/27/2017   Procedure: Re-exploration for CSF leak repair and placement of lumbar drain;  Surgeon: Tia Alert, MD;  Location: [facility] OR;  Service: Neurosurgery;  Laterality: N/A;  . middle finger reatachment    . PLACEMENT OF LUMBAR DRAIN N/A 12/27/2017   Procedure: PLACEMENT OF LUMBAR DRAIN;  Surgeon: Tia Alert, MD;  Location: [facility] OR;  Service: Neurosurgery;  Laterality: N/A;  . VENTRICULOPERITONEAL SHUNT N/A 01/08/2018   Procedure: Re-exploration and repair of previous Lumbar cerebro-spinal fluid leak and placement of Lumbar drain;  Surgeon: Tia Alert, MD;  Location: [facility] OR;  Service: Neurosurgery;  Laterality: N/A;     OB History   None      Home Medications    Prior to Admission medications   Medication Sig Start Date End Date Taking? Authorizing Provider  albuterol (VENTOLIN HFA) 108 (90 Base) MCG/ACT inhaler Inhale 2 puffs into the lungs every 6 (six) hours as needed for wheezing or shortness of breath.   Yes [provider]  [facility]  50-300-40 MG CAPS Take 1 capsule by mouth every 4 (four) hours as needed (headache).  12/19/17  Yes [provider]  calcium carbonate (TUMS EX) 750 MG chewable tablet Chew 2 tablets by mouth as needed for heartburn.   Yes [provider]  clonazePAM (KLONOPIN) 2 MG tablet Take 1 tablet (2 mg total) by mouth 2 (two) times daily for 15 doses. 12/06/17 03/29/18 Yes Osvaldo Shipper, MD  cyanocobalamin (,VITAMIN B-12,) 1000 MCG/ML injection Inject 1,000 mcg into the muscle See admin instructions. Every two weeks 02/16/18  Yes [provider]    cyclobenzaprine (FLEXERIL) 10 MG tablet Take 1 tablet (10 mg total) by mouth 3 (three) times daily as needed for muscle spasms. 12/06/17  Yes Osvaldo Shipper, MD  diphenhydrAMINE (BENADRYL) 25 mg capsule Take 50 mg by mouth every 6 (six) hours as needed for sleep.   Yes [provider]  gabapentin (NEURONTIN) 300 MG capsule Take 2 capsules (600 mg total) by mouth 3 (three) times daily. 11/21/17 03/29/18 Yes Amin, Loura Halt, MD  ibuprofen (ADVIL,MOTRIN) 200 MG tablet Take 800-1,000 mg by mouth every 6 (six) hours as needed (for pain or headaches).    Yes [provider]  levothyroxine (SYNTHROID, LEVOTHROID) 50 MCG tablet Take 50 mcg by mouth daily.  06/06/17  Yes [provider]  lisinopril-hydrochlorothiazide (PRINZIDE,ZESTORETIC) 10-12.5 MG tablet Take 1 tablet by mouth at bedtime.  10/27/17  Yes [provider]  magnesium citrate SOLN Take 1 Bottle by mouth once as needed for mild constipation or moderate constipation.    Yes [provider]  Melatonin 10 MG TABS Take 10 mg by mouth daily as needed (sleep).   Yes [provider]  ondansetron (ZOFRAN ODT) 8 MG disintegrating tablet Take 1 tablet (8 mg total) by mouth every 8 (eight) hours as needed for up to 20 doses for nausea or vomiting. 11/21/17  Yes Amin, Ankit Chirag, MD  oxyCODONE (OXY IR/ROXICODONE) 5 MG immediate release tablet Take 1 tablet (5 mg total) by mouth every 4 (four) hours as needed (for pain). 01/16/18  Yes Tia Alert, MD  oxyCODONE-acetaminophen (PERCOCET/ROXICET) 5-325 MG tablet Take 1 tablet by mouth every 4 (four) hours as needed for severe pain. 03/26/18  Yes Donnetta Hutching, MD  traZODone (DESYREL) 150 MG tablet Take 150 mg by mouth at bedtime as needed for sleep.   Yes [provider]  venlafaxine XR (EFFEXOR-XR) 150 MG 24 hr capsule Take 150 mg by mouth at bedtime.  06/06/17 03/29/18 Yes [provider]  venlafaxine XR (EFFEXOR-XR) 75 MG 24 hr capsule  Take 75 mg by mouth at bedtime.  12/12/17  Yes [provider]  metoCLOPramide (REGLAN) 10 MG tablet Take 1 tablet (10 mg total) by mouth every 6 (six) hours as needed for nausea (or headache). Patient not taking: Reported on 03/26/2018 12/16/17   Samuel Jester, DO  neomycin-bacitracin-polymyxin (NEOSPORIN) OINT Apply 1 application topically 2 (two) times daily. Patient not taking: Reported on 03/26/2018 12/06/17   Osvaldo Shipper, MD  promethazine (PHENERGAN) 12.5 MG tablet Take 1 tablet (12.5 mg total) by mouth every 6 (six) hours as needed for refractory nausea / vomiting. Patient not taking: Reported on 03/26/2018 11/21/17   Dimple Nanas, MD    Family History Family History  Problem Relation Age of Onset  . Diabetes Mellitus II Mother   . Hypertension Mother   . Seizures Mother   . Hypertension Father   . Diabetes Mellitus II Father   .  Hyperlipidemia Father   . Bipolar disorder Sister     Social History Social History   Tobacco Use  . Smoking status: Never Smoker  . Smokeless tobacco: Never Used  Substance Use Topics  . Alcohol use: Yes    Comment: rare  . Drug use: No     Allergies   Cinnamon; Coconut oil; Lovenox [enoxaparin]; Nitrofurantoin; and Other   Review of Systems Review of Systems  Eyes: Positive for pain and visual disturbance.  Neurological: Positive for headaches.  All other systems reviewed and are negative.    Physical Exam Updated Vital Signs BP 121/86   Pulse (!) 108   Temp 98.2 F (36.8 C) (Oral)   Resp 18   Ht 5\' 1"  (1.549 m)   Wt 136.1 kg   LMP 03/28/2018   SpO2 97%   BMI 56.68 kg/m   Physical Exam  Constitutional: She is oriented to person, place, and time. She appears well-developed and well-nourished. No distress.  HENT:  Head: Normocephalic and atraumatic.  Right Ear: Hearing normal.  Left Ear: Hearing normal.  Nose: Nose normal.  Mouth/Throat: Oropharynx is clear and moist and mucous membranes are normal.   Eyes: Conjunctivae and EOM are normal.  RAPD present Left eye  Neck: Normal range of motion. Neck supple.  Cardiovascular: Regular rhythm, S1 normal and S2 normal. Exam reveals no gallop and no friction rub.  No murmur heard. Pulmonary/Chest: Effort normal and breath sounds normal. No respiratory distress. She exhibits no tenderness.  Abdominal: Soft. Normal appearance and bowel sounds are normal. There is no hepatosplenomegaly. There is no tenderness. There is no rebound, no guarding, no tenderness at McBurney's point and negative Murphy's sign. No hernia.  Musculoskeletal: Normal range of motion.  Neurological: She is alert and oriented to person, place, and time. She has normal strength. No cranial nerve deficit or sensory deficit. Coordination normal. GCS eye subscore is 4. GCS verbal subscore is 5. GCS motor subscore is 6.  Skin: Skin is warm, dry and intact. No rash noted. No cyanosis.  Psychiatric: She has a normal mood and affect. Her speech is normal and behavior is normal. Thought content normal.  Nursing note and vitals reviewed.    ED Treatments / Results  Labs (all labs ordered are listed, but only abnormal results are displayed) Labs Reviewed  BASIC METABOLIC PANEL - Abnormal; Notable for the following components:      Result Value   Glucose, Bld 142 (*)    All other components within normal limits  CBC WITH DIFFERENTIAL/PLATELET - Abnormal; Notable for the following components:   MCHC 29.6 (*)    All other components within normal limits  I-STAT BETA HCG BLOOD, ED ([facility], [facility], [facility] ONLY)    EKG None  Radiology Mr Laqueta Jean And Wo Contrast  Result Date: 03/29/2018 CLINICAL DATA:  Decreased left eye vision. EXAM: MRI HEAD AND ORBITS WITHOUT AND WITH CONTRAST TECHNIQUE: Multiplanar, multiecho pulse sequences of the brain and surrounding structures were obtained without and with intravenous contrast. Multiplanar, multiecho pulse sequences of the orbits and surrounding  structures were obtained including fat saturation techniques, before and after intravenous contrast administration. CONTRAST:  10 mL Gadavist COMPARISON:  Brain MRI/MRA 03/26/2018 FINDINGS: MRI HEAD FINDINGS BRAIN: There is no acute infarct, acute hemorrhage, hydrocephalus or extra-axial collection. The midline structures are normal. No midline shift or other mass effect. There are no old infarcts. The white matter signal is normal for the patient's age. The cerebral and cerebellar volume are  age-appropriate. Susceptibility-sensitive sequences show no chronic microhemorrhage or superficial siderosis. No abnormal contrast enhancement. VASCULAR: Major intracranial arterial and venous sinus flow voids are normal. SKULL AND UPPER CERVICAL SPINE: Calvarial bone marrow signal is normal. There is no skull base mass. Visualized upper cervical spine and soft tissues are normal. MRI ORBITS FINDINGS Orbits: --Globes: Normal. --Bony orbit: Normal. --Preseptal soft tissues: Normal. --Intra- and extraconal orbital fat: Normal. No inflammatory stranding. --Optic nerves: Normal. --Lacrimal glands and fossae: Normal. --Extraocular muscles: Normal. Visualized sinuses:  No fluid levels or advanced mucosal thickening. Soft tissues: Normal. Limited intracranial: Normal. IMPRESSION: Normal MRI of the brain and orbits. Electronically Signed   By: Deatra Robinson M.D.   On: 03/29/2018 00:09   Mr Rockwell [place] ZO Contrast  Result Date: 03/29/2018 CLINICAL DATA:  Decreased left eye vision. EXAM: MRI HEAD AND ORBITS WITHOUT AND WITH CONTRAST TECHNIQUE: Multiplanar, multiecho pulse sequences of the brain and surrounding structures were obtained without and with intravenous contrast. Multiplanar, multiecho pulse sequences of the orbits and surrounding structures were obtained including fat saturation techniques, before and after intravenous contrast administration. CONTRAST:  10 mL Gadavist COMPARISON:  Brain MRI/MRA 03/26/2018 FINDINGS: MRI HEAD  FINDINGS BRAIN: There is no acute infarct, acute hemorrhage, hydrocephalus or extra-axial collection. The midline structures are normal. No midline shift or other mass effect. There are no old infarcts. The white matter signal is normal for the patient's age. The cerebral and cerebellar volume are age-appropriate. Susceptibility-sensitive sequences show no chronic microhemorrhage or superficial siderosis. No abnormal contrast enhancement. VASCULAR: Major intracranial arterial and venous sinus flow voids are normal. SKULL AND UPPER CERVICAL SPINE: Calvarial bone marrow signal is normal. There is no skull base mass. Visualized upper cervical spine and soft tissues are normal. MRI ORBITS FINDINGS Orbits: --Globes: Normal. --Bony orbit: Normal. --Preseptal soft tissues: Normal. --Intra- and extraconal orbital fat: Normal. No inflammatory stranding. --Optic nerves: Normal. --Lacrimal glands and fossae: Normal. --Extraocular muscles: Normal. Visualized sinuses:  No fluid levels or advanced mucosal thickening. Soft tissues: Normal. Limited intracranial: Normal. IMPRESSION: Normal MRI of the brain and orbits. Electronically Signed   By: Deatra Robinson M.D.   On: 03/29/2018 00:09    Procedures Procedures (including critical care time)  Medications Ordered in ED Medications  sodium chloride 0.9 % bolus 1,000 mL (1,000 mLs Intravenous New Bag/Given 03/29/18 0210)  valproate (DEPACON) 500 mg in dextrose 5 % 50 mL IVPB (has no administration in time range)  gadobutrol (GADAVIST) 1 MMOL/ML injection 10 mL (10 mLs Intravenous Contrast Given 03/28/18 2355)  ketorolac (TORADOL) 30 MG/ML injection 30 mg (30 mg Intravenous Given 03/29/18 0209)  prochlorperazine (COMPAZINE) injection 10 mg (10 mg Intravenous Given 03/29/18 0209)  diphenhydrAMINE (BENADRYL) injection 25 mg (25 mg Intravenous Given 03/29/18 0209)     Initial Impression / Assessment and Plan / ED Course  I have reviewed the triage vital signs and the  nursing notes.  Pertinent labs & imaging results that were available during my care of the patient were reviewed by me and considered in my medical decision making (see chart for details).     Patient presents to the emergency department for further evaluation of loss of vision in the left eye.  She was seen by ophthalmology today and examination revealed relative afferent pupillary defect.  This was concerning for optic neuritis, sent to the ER for further evaluation.  She is underwent MRI with contrast of brain and orbits.  No evidence of inflammatory process, no inflammation of the optic nerve.  At time of my evaluation, she is complaining of tearing, decreased vision to the point of only seeing shadows and severe pain behind the left eye.  Within normal MRI, ocular migraine was also considered.  Dr. Laurence Slate of the neurology has seen the patient.  He recommended treating with migraine cocktail.  This was performed.  Very soon after she was given the migraine cocktail (Toradol, Compazine, Benadryl) he had complete resolution.  I pain has completely resolved, her vision is back to normal.  Examination reveals absence of RAPD.  This is consistent with ocular migraine.  She will be discharged, follow-up with neurology.  Final Clinical Impressions(s) / ED Diagnoses   Final diagnoses:  Ocular migraine    ED Discharge Orders    None       Pollina, Canary Brim, MD 03/29/18 0301    Gilda Crease, MD 03/29/18 [phone]

## 2018-04-05 DIAGNOSIS — I10 Essential (primary) hypertension: Secondary | ICD-10-CM

## 2018-06-09 DIAGNOSIS — R51 Headache: Secondary | ICD-10-CM | POA: Diagnosis not present

## 2018-06-09 DIAGNOSIS — Z6841 Body Mass Index (BMI) 40.0 and over, adult: Secondary | ICD-10-CM | POA: Diagnosis not present

## 2018-06-11 DIAGNOSIS — R1031 Right lower quadrant pain: Secondary | ICD-10-CM | POA: Diagnosis not present

## 2018-06-11 DIAGNOSIS — F419 Anxiety disorder, unspecified: Secondary | ICD-10-CM | POA: Diagnosis not present

## 2018-06-11 DIAGNOSIS — F329 Major depressive disorder, single episode, unspecified: Secondary | ICD-10-CM | POA: Diagnosis not present

## 2018-06-11 DIAGNOSIS — K219 Gastro-esophageal reflux disease without esophagitis: Secondary | ICD-10-CM | POA: Diagnosis not present

## 2018-06-14 ENCOUNTER — Emergency Department (HOSPITAL_COMMUNITY)
Admission: EM | Admit: 2018-06-14 | Discharge: 2018-06-14 | Discharge status: Against Medical Advice | Payer: BLUE CROSS/BLUE SHIELD | Attending: Emergency Medicine | Admitting: Emergency Medicine

## 2018-06-14 ENCOUNTER — Other Ambulatory Visit: Payer: Self-pay

## 2018-06-14 ENCOUNTER — Encounter (HOSPITAL_COMMUNITY): Payer: Self-pay | Admitting: Emergency Medicine

## 2018-06-14 ENCOUNTER — Emergency Department (HOSPITAL_COMMUNITY): Payer: BLUE CROSS/BLUE SHIELD

## 2018-06-14 DIAGNOSIS — Z79899 Other long term (current) drug therapy: Secondary | ICD-10-CM | POA: Diagnosis not present

## 2018-06-14 DIAGNOSIS — Z3202 Encounter for pregnancy test, result negative: Secondary | ICD-10-CM | POA: Insufficient documentation

## 2018-06-14 DIAGNOSIS — I10 Essential (primary) hypertension: Secondary | ICD-10-CM | POA: Insufficient documentation

## 2018-06-14 DIAGNOSIS — E039 Hypothyroidism, unspecified: Secondary | ICD-10-CM | POA: Insufficient documentation

## 2018-06-14 DIAGNOSIS — O26891 Other specified pregnancy related conditions, first trimester: Secondary | ICD-10-CM | POA: Diagnosis not present

## 2018-06-14 DIAGNOSIS — Z3A01 Less than 8 weeks gestation of pregnancy: Secondary | ICD-10-CM | POA: Diagnosis not present

## 2018-06-14 DIAGNOSIS — R1030 Lower abdominal pain, unspecified: Secondary | ICD-10-CM | POA: Diagnosis not present

## 2018-06-14 DIAGNOSIS — Z7984 Long term (current) use of oral hypoglycemic drugs: Secondary | ICD-10-CM | POA: Diagnosis not present

## 2018-06-14 DIAGNOSIS — R102 Pelvic and perineal pain: Secondary | ICD-10-CM | POA: Insufficient documentation

## 2018-06-14 LAB — CBC
HCT: 38.1 % (ref 36.0–46.0)
Hemoglobin: 12 g/dL (ref 12.0–15.0)
MCH: 28.2 pg (ref 26.0–34.0)
MCHC: 31.5 g/dL (ref 30.0–36.0)
MCV: 89.6 fL (ref 80.0–100.0)
NRBC: 0 % (ref 0.0–0.2)
PLATELETS: 312 10*3/uL (ref 150–400)
RBC: 4.25 MIL/uL (ref 3.87–5.11)
RDW: 14.6 % (ref 11.5–15.5)
WBC: 7.4 10*3/uL (ref 4.0–10.5)

## 2018-06-14 LAB — URINALYSIS, ROUTINE W REFLEX MICROSCOPIC
Bilirubin Urine: NEGATIVE
GLUCOSE, UA: NEGATIVE mg/dL
HGB URINE DIPSTICK: NEGATIVE
KETONES UR: NEGATIVE mg/dL
Leukocytes, UA: NEGATIVE
Nitrite: NEGATIVE
PROTEIN: NEGATIVE mg/dL
Specific Gravity, Urine: 1.017 (ref 1.005–1.030)
pH: 5 (ref 5.0–8.0)

## 2018-06-14 LAB — COMPREHENSIVE METABOLIC PANEL
ALK PHOS: 74 U/L (ref 38–126)
ALT: 45 U/L — AB (ref 0–44)
ANION GAP: 5 (ref 5–15)
AST: 39 U/L (ref 15–41)
Albumin: 3.8 g/dL (ref 3.5–5.0)
BILIRUBIN TOTAL: 0.5 mg/dL (ref 0.3–1.2)
BUN: 11 mg/dL (ref 6–20)
CALCIUM: 8.2 mg/dL — AB (ref 8.9–10.3)
CO2: 24 mmol/L (ref 22–32)
CREATININE: 0.61 mg/dL (ref 0.44–1.00)
Chloride: 107 mmol/L (ref 98–111)
GFR calc non Af Amer: >60 mL/min (ref >60)
GLUCOSE: 159 mg/dL — AB (ref 70–99)
Potassium: 4.1 mmol/L (ref 3.5–5.1)
Sodium: 136 mmol/L (ref 135–145)
TOTAL PROTEIN: 7.1 g/dL (ref 6.5–8.1)

## 2018-06-14 LAB — HCG, QUANTITATIVE, PREGNANCY

## 2018-06-14 LAB — LIPASE, BLOOD: Lipase: 29 U/L (ref 11–51)

## 2018-06-14 NOTE — ED Notes (Signed)
Patient not in room upon entrance into room.

## 2018-06-14 NOTE — Discharge Instructions (Addendum)
Pregnancy test is negative.  Pelvic ultrasound does not show any pelvic problems.  Use Tylenol or Motrin for pain.  Follow-up with a gynecologist regarding your missed period and ongoing management of your symptoms.

## 2018-06-14 NOTE — ED Provider Notes (Signed)
[facility] EMERGENCY DEPARTMENT Provider Note   CSN: [id number] Arrival date & time: 06/14/18  4098     History   Chief Complaint Chief Complaint  Patient presents with  . Abdominal Pain    HPI Sonya Gibson is a 32 y.o. female.  HPI   She complains of low abdominal discomfort rating to her back for several days.  No vaginal bleeding.  Last menstrual cycle 09/23/2017.  She thinks that she is pregnant.  He is G6 para 2, with one D&C, for failed pregnancy and 3 spontaneous miscarriages.  She denies fever, chills, weakness or dizziness.  There are no other known modifying factors.  Past Medical History:  Diagnosis Date  . Anxiety   . Chronic back pain   . Depression   . Drug-seeking behavior   . Essential hypertension   . GERD (gastroesophageal reflux disease)   . Hypothyroidism   . Lumbar radiculopathy, right   . Migraine headache   . PTSD (post-traumatic stress disorder)     Patient Active Problem List   Diagnosis Date Noted  . CSF leak 12/20/2017  . Pressure injury of skin 12/20/2017  . Lumbar radiculopathy   . S/P lumbar laminectomy 12/05/2017  . Perineal numbness 11/30/2017  . Weakness of right lower extremity 11/17/2017  . Lower back pain 11/15/2017  . Hypothyroidism   . Anxiety   . Essential hypertension   . PTSD (post-traumatic stress disorder)   . Decreased sensation of leg   . GESTATIONAL DIABETES 04/26/2010    Past Surgical History:  Procedure Laterality Date  . ANTERIOR CRUCIATE LIGAMENT REPAIR Left   . BACK SURGERY  12/05/2017  . CESAREAN SECTION    . IR EPIDUROGRAPHY  11/17/2017  . KNEE SURGERY    . LUMBAR LAMINECTOMY/DECOMPRESSION MICRODISCECTOMY Right 12/05/2017   Procedure: Right L4-5 Microdiskectomy;  Surgeon: Tia Alert, MD;  Location: [facility] OR;  Service: Neurosurgery;  Laterality: Right;  . LUMBAR LAMINECTOMY/DECOMPRESSION MICRODISCECTOMY N/A 12/20/2017   Procedure: Repair of CSF Leak;  Surgeon: Tia Alert, MD;  Location: [facility] OR;   Service: Neurosurgery;  Laterality: N/A;  . LUMBAR WOUND DEBRIDEMENT N/A 12/27/2017   Procedure: Re-exploration for CSF leak repair and placement of lumbar drain;  Surgeon: Tia Alert, MD;  Location: [facility] OR;  Service: Neurosurgery;  Laterality: N/A;  . middle finger reatachment    . PLACEMENT OF LUMBAR DRAIN N/A 12/27/2017   Procedure: PLACEMENT OF LUMBAR DRAIN;  Surgeon: Tia Alert, MD;  Location: [facility] OR;  Service: Neurosurgery;  Laterality: N/A;  . VENTRICULOPERITONEAL SHUNT N/A 01/08/2018   Procedure: Re-exploration and repair of previous Lumbar cerebro-spinal fluid leak and placement of Lumbar drain;  Surgeon: Tia Alert, MD;  Location: [facility] OR;  Service: Neurosurgery;  Laterality: N/A;     OB History    Gravida  1   Para      Term      Preterm      AB      Living        SAB      TAB      Ectopic      Multiple      Live Births               Home Medications    Prior to Admission medications   Medication Sig Start Date End Date Taking? Authorizing Provider  acetaminophen (TYLENOL) 325 MG tablet Take 650 mg by mouth every 6 (six) hours as needed.   Yes  [provider]  albuterol (VENTOLIN HFA) 108 (90 Base) MCG/ACT inhaler Inhale 2 puffs into the lungs every 6 (six) hours as needed for wheezing or shortness of breath.   Yes [provider]  calcium carbonate (TUMS EX) 750 MG chewable tablet Chew 2 tablets by mouth as needed for heartburn.   Yes [provider]  cyanocobalamin (,VITAMIN B-12,) 1000 MCG/ML injection Inject 1,000 mcg into the muscle See admin instructions. Every two weeks 02/16/18  Yes [provider]  cyclobenzaprine (FLEXERIL) 10 MG tablet Take 1 tablet (10 mg total) by mouth 3 (three) times daily as needed for muscle spasms. 12/06/17  Yes Osvaldo ShipperKrishnan, Gokul, MD  Melatonin 10 MG TABS Take 10 mg by mouth daily as needed (sleep).   Yes [provider]  metFORMIN (GLUCOPHAGE) 500 MG tablet Take 1 tablet by  mouth 2 (two) times daily with a meal.  09/25/17 09/20/18 Yes [provider]  ondansetron (ZOFRAN ODT) 8 MG disintegrating tablet Take 1 tablet (8 mg total) by mouth every 8 (eight) hours as needed for up to 20 doses for nausea or vomiting. 11/21/17  Yes Amin, Loura HaltAnkit Chirag, MD  traZODone (DESYREL) 150 MG tablet Take 150 mg by mouth at bedtime as needed for sleep.   Yes [provider]  venlafaxine XR (EFFEXOR-XR) 150 MG 24 hr capsule Take 150 mg by mouth at bedtime.  06/06/17 06/14/18 Yes [provider]  venlafaxine XR (EFFEXOR-XR) 75 MG 24 hr capsule Take 75 mg by mouth at bedtime.  12/12/17  Yes [provider]  Vitamin D, Ergocalciferol, (DRISDOL) 1.25 MG (50000 UT) CAPS capsule Take 50,000 Units by mouth every 7 (seven) days.   Yes [provider]  Butalbital-APAP-Caffeine 50-300-40 MG CAPS Take 1 capsule by mouth every 4 (four) hours as needed (headache).  12/19/17   [provider]  clonazePAM (KLONOPIN) 2 MG tablet Take 1 tablet (2 mg total) by mouth 2 (two) times daily for 15 doses. Patient not taking: Reported on 06/14/2018 12/06/17 03/29/18  Osvaldo ShipperKrishnan, Gokul, MD  gabapentin (NEURONTIN) 300 MG capsule Take 2 capsules (600 mg total) by mouth 3 (three) times daily. Patient not taking: Reported on 06/14/2018 11/21/17 03/29/18  Dimple NanasAmin, Ankit Chirag, MD  metoCLOPramide (REGLAN) 10 MG tablet Take 1 tablet (10 mg total) by mouth every 6 (six) hours as needed for nausea (or headache). Patient not taking: Reported on 03/26/2018 12/16/17   Samuel JesterMcManus, Kathleen, DO    Family History Family History  Problem Relation Age of Onset  . Diabetes Mellitus II Mother   . Hypertension Mother   . Seizures Mother   . Hypertension Father   . Diabetes Mellitus II Father   . Hyperlipidemia Father   . Bipolar disorder Sister     Social History Social History   Tobacco Use  . Smoking status: Never Smoker  . Smokeless tobacco: Never Used  Substance Use Topics  .  Alcohol use: Yes    Comment: socially  . Drug use: No     Allergies   Cinnamon; Coconut oil; Lovenox [enoxaparin]; Nitrofurantoin; and Other   Review of Systems Review of Systems  All other systems reviewed and are negative.    Physical Exam Updated Vital Signs BP 127/90 (BP Location: Right Arm)   Pulse (!) 102   Temp 97.9 F (36.6 C) (Oral)   Resp 20   Ht 5\' 1"  (1.549 m)   Wt 135.2 kg   LMP 04/25/2018   SpO2 97%   BMI 56.31 kg/m  Physical Exam Vitals signs and nursing note reviewed.  Constitutional:      Appearance: She is well-developed. She is not ill-appearing or diaphoretic.  HENT:     Head: Normocephalic and atraumatic.     Right Ear: External ear normal.     Left Ear: External ear normal.  Eyes:     Conjunctiva/sclera: Conjunctivae normal.     Pupils: Pupils are equal, round, and reactive to light.  Neck:     Musculoskeletal: Normal range of motion and neck supple.     Trachea: Phonation normal.  Cardiovascular:     Rate and Rhythm: Normal rate and regular rhythm.     Heart sounds: Normal heart sounds.  Pulmonary:     Effort: Pulmonary effort is normal.     Breath sounds: Normal breath sounds.  Abdominal:     Palpations: Abdomen is soft.     Tenderness: There is abdominal tenderness (Suprapubic, mild).  Musculoskeletal: Normal range of motion.  Skin:    General: Skin is warm and dry.  Neurological:     Mental Status: She is alert and oriented to person, place, and time.     Cranial Nerves: No cranial nerve deficit.     Sensory: No sensory deficit.     Motor: No abnormal muscle tone.     Coordination: Coordination normal.  Psychiatric:        Behavior: Behavior normal.        Thought Content: Thought content normal.        Judgment: Judgment normal.      ED Treatments / Results  Labs (all labs ordered are listed, but only abnormal results are displayed) Labs Reviewed  COMPREHENSIVE METABOLIC PANEL - Abnormal; Notable for the following  components:      Result Value   Glucose, Bld 159 (*)    Calcium 8.2 (*)    ALT 45 (*)    All other components within normal limits  LIPASE, BLOOD  CBC  URINALYSIS, ROUTINE W REFLEX MICROSCOPIC  HCG, QUANTITATIVE, PREGNANCY    EKG None  Radiology [place] Ob Comp < 14 Wks  Result Date: 06/14/2018 CLINICAL DATA:  Pelvic pain and pressure for the past day. Estimated gestational age of [redacted] weeks, 1 day by LMP. EXAM: OBSTETRIC <14 WK US AND TRANSVAGINAL OB US TECHNIQUE: Both transabdominal and transvaginal ultrasound examinations were performed for complete evaluation of the gestation as well as the maternal uterus, adnexal regions, and pelvic cul-de-sac. Transvaginal technique was performed to assess early pregnancy. COMPARISON:  CT abdomen pelvis dated February 20, 2018. FINDINGS: Intrauterine gestational sac: None Yolk sac:  Not Visualized. Embryo:  Not Visualized. Maternal uterus/adnexae: Unremarkable. Trace free fluid in the pelvis, likely physiologic. IMPRESSION: 1.  No IUP is visualized. By definition, in the setting of a positive pregnancy test, this reflects a pregnancy of unknown location. Differential considerations include early normal IUP, abnormal IUP/missed abortion, or nonvisualized ectopic pregnancy. Serial beta HCG is suggested. Consider repeat pelvic ultrasound in 14 days. Electronically Signed   By: Obie DredgeWilliam T Derry M.D.   On: 06/14/2018 11:38   [place] Ob Transvaginal  Result Date: 06/14/2018 CLINICAL DATA:  Pelvic pain and pressure for the past day. Estimated gestational age of [redacted] weeks, 1 day by LMP. EXAM: OBSTETRIC <14 WK US AND TRANSVAGINAL OB US TECHNIQUE: Both transabdominal and transvaginal ultrasound examinations were performed for complete evaluation of the gestation as well as the maternal uterus, adnexal regions, and pelvic cul-de-sac. Transvaginal technique was performed to assess early pregnancy. COMPARISON:  CT abdomen pelvis dated February 20, 2018. FINDINGS: Intrauterine  gestational sac: None Yolk sac:  Not Visualized. Embryo:  Not Visualized. Maternal uterus/adnexae: Unremarkable. Trace free fluid in the pelvis, likely physiologic. IMPRESSION: 1.  No IUP is visualized. By definition, in the setting of a positive pregnancy test, this reflects a pregnancy of unknown location. Differential considerations include early normal IUP, abnormal IUP/missed abortion, or nonvisualized ectopic pregnancy. Serial beta HCG is suggested. Consider repeat pelvic ultrasound in 14 days. Electronically Signed   By: Obie Dredge M.D.   On: 06/14/2018 11:38    Procedures Procedures (including critical care time)  Medications Ordered in ED Medications - No data to display   Initial Impression / Assessment and Plan / ED Course  I have reviewed the triage vital signs and the nursing notes.  Pertinent labs & imaging results that were available during my care of the patient were reviewed by me and considered in my medical decision making (see chart for details).  Clinical Course as of Jun 14 1316  Thu Jun 14, 2018  1312 Normal  hCG, quantitative, pregnancy [EW]  1313 Normal  Lipase, blood [EW]  1313 Normal  CBC [EW]  1313 Normal except glucose high, calcium low, ALT high  Comprehensive metabolic panel(!) [EW]  1313 Normal  Urinalysis, Routine w reflex microscopic [EW]  1313 No intrauterine pregnancy or abnormality.  US OB Comp < 14 Wks [EW]    Clinical Course User Index [EW] Mancel Bale, MD     Patient Vitals for the past 24 hrs:  BP Temp Temp src Pulse Resp SpO2 Height Weight  06/14/18 0904 127/90 97.9 F (36.6 C) Oral (!) 102 20 97 % 5\' 1"  (1.549 m) 135.2 kg    1:13 PM Reevaluation with update and discussion. After initial assessment and treatment, an updated evaluation reveals no further complaints, findings discussed with the patient and all questions were answered. Mancel Bale   Medical Decision Making: Patient with suspected pregnancy, and will abdominal  pain/pelvic pain.  Pregnancy test here negative.  Screening for pelvic abnormalities with ultrasound imaging is negative.  CRITICAL CARE-no Performed by: Mancel Bale   Nursing Notes Reviewed/ Care Coordinated Applicable Imaging Reviewed Interpretation of Laboratory Data incorporated into ED treatment  The patient appears reasonably screened and/or stabilized for discharge and I doubt any other medical condition or other [facility] requiring further screening, evaluation, or treatment in the ED at this time prior to discharge.  Plan: Home Medications-continue usual medications, use Tylenol for pain; Home Treatments-rest, fluids; return here if the recommended treatment, does not improve the symptoms; Recommended follow up-GYN follow-up regarding missed period and ongoing management     Final Clinical Impressions(s) / ED Diagnoses   Final diagnoses:  Pelvic pain    ED Discharge Orders    None       Mancel Bale, MD 06/14/18 1317

## 2018-06-14 NOTE — ED Notes (Signed)
Lab called and stated that patient's blood pregnancy test was negative. Told lab to repeat test.

## 2018-06-14 NOTE — ED Triage Notes (Signed)
Patient complaining of lower abdominal "pressure" since last night. States she is [redacted] weeks pregnant. Denies vaginal bleeding or discharge. Denies dysuria.

## 2018-06-14 NOTE — ED Notes (Signed)
Patient asked for TUMS. I told pt I could ask provider for nausea meds. Pt stated she changed her mind and didn't want anything.

## 2018-07-02 ENCOUNTER — Observation Stay (HOSPITAL_COMMUNITY)
Admission: AD | Admit: 2018-07-02 | Discharge: 2018-07-03 | Disposition: A | Discharge status: Home / Self Care | Payer: BLUE CROSS/BLUE SHIELD | Source: Other Acute Inpatient Hospital | Attending: Internal Medicine | Admitting: Internal Medicine

## 2018-07-02 ENCOUNTER — Observation Stay (HOSPITAL_COMMUNITY): Payer: BLUE CROSS/BLUE SHIELD

## 2018-07-02 DIAGNOSIS — R51 Headache: Principal | ICD-10-CM | POA: Insufficient documentation

## 2018-07-02 DIAGNOSIS — E039 Hypothyroidism, unspecified: Secondary | ICD-10-CM | POA: Diagnosis not present

## 2018-07-02 DIAGNOSIS — N939 Abnormal uterine and vaginal bleeding, unspecified: Secondary | ICD-10-CM | POA: Insufficient documentation

## 2018-07-02 DIAGNOSIS — F419 Anxiety disorder, unspecified: Secondary | ICD-10-CM | POA: Diagnosis not present

## 2018-07-02 DIAGNOSIS — I10 Essential (primary) hypertension: Secondary | ICD-10-CM | POA: Diagnosis not present

## 2018-07-02 DIAGNOSIS — G44211 Episodic tension-type headache, intractable: Secondary | ICD-10-CM

## 2018-07-02 DIAGNOSIS — R519 Headache, unspecified: Secondary | ICD-10-CM | POA: Diagnosis present

## 2018-07-02 LAB — COMPREHENSIVE METABOLIC PANEL
ALT: 38 U/L (ref 0–44)
ANION GAP: 8 (ref 5–15)
AST: 37 U/L (ref 15–41)
Albumin: 3.5 g/dL (ref 3.5–5.0)
Alkaline Phosphatase: 65 U/L (ref 38–126)
BILIRUBIN TOTAL: 0.5 mg/dL (ref 0.3–1.2)
BUN: 6 mg/dL (ref 6–20)
CO2: 24 mmol/L (ref 22–32)
Calcium: 8.5 mg/dL — ABNORMAL LOW (ref 8.9–10.3)
Chloride: 100 mmol/L (ref 98–111)
Creatinine, Ser: 0.76 mg/dL (ref 0.44–1.00)
GFR calc non Af Amer: >60 mL/min (ref >60)
Glucose, Bld: 164 mg/dL — ABNORMAL HIGH (ref 70–99)
POTASSIUM: 3.8 mmol/L (ref 3.5–5.1)
Sodium: 132 mmol/L — ABNORMAL LOW (ref 135–145)
TOTAL PROTEIN: 7 g/dL (ref 6.5–8.1)

## 2018-07-02 LAB — CBC
HEMATOCRIT: 35.9 % — AB (ref 36.0–46.0)
Hemoglobin: 11.8 g/dL — ABNORMAL LOW (ref 12.0–15.0)
MCH: 29.2 pg (ref 26.0–34.0)
MCHC: 32.9 g/dL (ref 30.0–36.0)
MCV: 88.9 fL (ref 80.0–100.0)
Platelets: 377 10*3/uL (ref 150–400)
RBC: 4.04 MIL/uL (ref 3.87–5.11)
RDW: 14.4 % (ref 11.5–15.5)
WBC: 9.1 10*3/uL (ref 4.0–10.5)
nRBC: 0 % (ref 0.0–0.2)

## 2018-07-02 LAB — HCG, QUANTITATIVE, PREGNANCY: hCG, Beta Chain, Quant, S: <1 m[IU]/mL (ref <5)

## 2018-07-02 MED ORDER — PROCHLORPERAZINE EDISYLATE 10 MG/2ML IJ SOLN
10.0000 mg | Freq: Four times a day (QID) | INTRAMUSCULAR | Status: DC | PRN
  Administered 2018-07-03: 10 mg via INTRAVENOUS
  Filled 2018-07-02: qty 2

## 2018-07-02 MED ORDER — DIPHENHYDRAMINE HCL 50 MG/ML IJ SOLN
12.5000 mg | Freq: Four times a day (QID) | INTRAMUSCULAR | Status: DC | PRN
  Administered 2018-07-03: 12.5 mg via INTRAVENOUS
  Filled 2018-07-02: qty 1

## 2018-07-02 MED ORDER — ACETAMINOPHEN 500 MG PO TABS
1000.0000 mg | ORAL_TABLET | Freq: Three times a day (TID) | ORAL | Status: DC | PRN
  Administered 2018-07-03: 1000 mg via ORAL
  Filled 2018-07-02: qty 2

## 2018-07-02 NOTE — H&P (Addendum)
History and Physical    Sonya AusHolly E Lamping JXB:[id number] DOB: 11/19/1985 DOA: 07/02/2018  PCP: Hoy RegisterNewlin, Enobong, MD  Chief Complaint: Headache  HPI: Sonya Gibson is a 33 y.o. female with medical history significant of anxiety, depression, PTSD, hypertension, GERD, hypothyroidism, migraine headaches presenting to the hospital as a transfer from [facility] in [place] for management of headache.  Unable to find imaging results in discharge paperwork.  Per conversation with the physician who accepted the transfer Dr. Candise CheGhirme, it was communicated to him by the provider at the outside hospital that the patient's head CT was negative for any acute finding.  History provided by patient.  Patient states that she has been having a headache for the past 1 day.  She went to the outside hospital in [place] for evaluation and was told that her head CT scan was abnormal.  Reports history of migraines and spinal fluid leak in the past. States her headache started yesterday.  It is located in the occipital region, is dull in character, and radiates to the neck region.  Headache is better when laying down supine but worse when sitting up or laying on her stomach.  At present, states her headache is 4/10 in intensity but becomes 6-7/10 when she tries to sit up.  Headache is associated with nausea and vomiting.  States she has tried taking Fioricet, Flexeril, and oxycodone for her headache but they have not been helping.  Also states that at the outside hospital, they gave her Reglan, morphine, and Decadron which did not help.  Patient states she was recently pregnant and found out around Christmas time that she had a miscarriage.  States she was placed on a medication by her OB/Gyn for 10 days which she has finished but continues to have vaginal bleeding.  States her OB/GYN is in [place].  Review of Systems: As per HPI otherwise 10 point review of systems negative.  Past Medical History:  Diagnosis Date  .  Anxiety   . Chronic back pain   . Depression   . Drug-seeking behavior   . Essential hypertension   . GERD (gastroesophageal reflux disease)   . Hypothyroidism   . Lumbar radiculopathy, right   . Migraine headache   . PTSD (post-traumatic stress disorder)     Past Surgical History:  Procedure Laterality Date  . ANTERIOR CRUCIATE LIGAMENT REPAIR Left   . BACK SURGERY  12/05/2017  . CESAREAN SECTION    . IR EPIDUROGRAPHY  11/17/2017  . KNEE SURGERY    . LUMBAR LAMINECTOMY/DECOMPRESSION MICRODISCECTOMY Right 12/05/2017   Procedure: Right L4-5 Microdiskectomy;  Surgeon: Tia AlertJones, David S, MD;  [facility];  Service: Neurosurgery;  Laterality: Right;  . LUMBAR LAMINECTOMY/DECOMPRESSION MICRODISCECTOMY N/A 12/20/2017   Procedure: Repair of CSF Leak;  Surgeon: Tia AlertJones, David S, MD;  [facility];  Service: Neurosurgery;  Laterality: N/A;  . LUMBAR WOUND DEBRIDEMENT N/A 12/27/2017   Procedure: Re-exploration for CSF leak repair and placement of lumbar drain;  Surgeon: Tia AlertJones, David S, MD;  [facility];  Service: Neurosurgery;  Laterality: N/A;  . middle finger reatachment    . PLACEMENT OF LUMBAR DRAIN N/A 12/27/2017   Procedure: PLACEMENT OF LUMBAR DRAIN;  Surgeon: Tia AlertJones, David S, MD;  [facility];  Service: Neurosurgery;  Laterality: N/A;  . VENTRICULOPERITONEAL SHUNT N/A 01/08/2018   Procedure: Re-exploration and repair of previous Lumbar cerebro-spinal fluid leak and placement of Lumbar drain;  Surgeon: Tia AlertJones, David S, MD;  [facility];  Service:  Neurosurgery;  Laterality: N/A;     reports that she has never smoked. She has never used smokeless tobacco. She reports current alcohol use. She reports that she does not use drugs.  Allergies  Allergen Reactions  . Cinnamon Anaphylaxis    (NO "REAL"/TREE BARK CINNAMON)  . Coconut Oil Anaphylaxis    Can eat "fake" coconut  . Lovenox [Enoxaparin] Other (See Comments)    BROKE DOWN THE SKIN AT INJECTION SITE AND CAUSED A WOUND  .  Nitrofurantoin Hives  . Other     "Headache cocktail"   Blister to abd    Family History  Problem Relation Age of Onset  . Diabetes Mellitus II Mother   . Hypertension Mother   . Seizures Mother   . Hypertension Father   . Diabetes Mellitus II Father   . Hyperlipidemia Father   . Bipolar disorder Sister     Prior to Admission medications   Medication Sig Start Date End Date Taking? Authorizing Provider  acetaminophen (TYLENOL) 325 MG tablet Take 650 mg by mouth every 6 (six) hours as needed.    [provider]  albuterol (VENTOLIN HFA) 108 (90 Base) MCG/ACT inhaler Inhale 2 puffs into the lungs every 6 (six) hours as needed for wheezing or shortness of breath.    [provider]  Butalbital-APAP-Caffeine 50-300-40 MG CAPS Take 1 capsule by mouth every 4 (four) hours as needed (headache).  12/19/17   [provider]  calcium carbonate (TUMS EX) 750 MG chewable tablet Chew 2 tablets by mouth as needed for heartburn.    [provider]  clonazePAM (KLONOPIN) 2 MG tablet Take 1 tablet (2 mg total) by mouth 2 (two) times daily for 15 doses. Patient not taking: Reported on 06/14/2018 12/06/17 03/29/18  Osvaldo Shipper, MD  cyanocobalamin (,VITAMIN B-12,) 1000 MCG/ML injection Inject 1,000 mcg into the muscle See admin instructions. Every two weeks 02/16/18   [provider]  cyclobenzaprine (FLEXERIL) 10 MG tablet Take 1 tablet (10 mg total) by mouth 3 (three) times daily as needed for muscle spasms. 12/06/17   Osvaldo Shipper, MD  gabapentin (NEURONTIN) 300 MG capsule Take 2 capsules (600 mg total) by mouth 3 (three) times daily. Patient not taking: Reported on 06/14/2018 11/21/17 03/29/18  Dimple Nanas, MD  Melatonin 10 MG TABS Take 10 mg by mouth daily as needed (sleep).    [provider]  metFORMIN (GLUCOPHAGE) 500 MG tablet Take 1 tablet by mouth 2 (two) times daily with a meal.  09/25/17 09/20/18  [provider]  metoCLOPramide  (REGLAN) 10 MG tablet Take 1 tablet (10 mg total) by mouth every 6 (six) hours as needed for nausea (or headache). Patient not taking: Reported on 03/26/2018 12/16/17   Samuel Jester, DO  ondansetron (ZOFRAN ODT) 8 MG disintegrating tablet Take 1 tablet (8 mg total) by mouth every 8 (eight) hours as needed for up to 20 doses for nausea or vomiting. 11/21/17   Amin, Loura Halt, MD  traZODone (DESYREL) 150 MG tablet Take 150 mg by mouth at bedtime as needed for sleep.    [provider]  venlafaxine XR (EFFEXOR-XR) 150 MG 24 hr capsule Take 150 mg by mouth at bedtime.  06/06/17 06/14/18  [provider]  venlafaxine XR (EFFEXOR-XR) 75 MG 24 hr capsule Take 75 mg by mouth at bedtime.  12/12/17   [provider]  Vitamin D, Ergocalciferol, (DRISDOL) 1.25 MG (50000 UT) CAPS capsule Take 50,000 Units by mouth every 7 (seven)  days.    [provider]    Physical Exam: Vitals:   07/02/18 1932  BP: 123/89  Pulse: 98  Resp: 20  Temp: 98.4 F (36.9 C)  TempSrc: Oral  SpO2: 98%  Weight: (!) 148.3 kg  Height: 5\' 1"  (1.549 m)    Physical Exam  Constitutional: She is oriented to person, place, and time. She appears well-developed and well-nourished. No distress.  Resting comfortably in a hospital bed.  Smiling and in no acute distress.  HENT:  Head: Normocephalic.  Mouth/Throat: Oropharynx is clear and moist.  Eyes: Pupils are equal, round, and reactive to light. EOM are normal.  Neck: Neck supple.  Cardiovascular: Normal rate, regular rhythm and intact distal pulses.  Pulmonary/Chest: Effort normal and breath sounds normal. No respiratory distress. She has no wheezes. She has no rales.  Abdominal: Soft. Bowel sounds are normal. She exhibits no distension. There is no abdominal tenderness. There is no guarding.  Musculoskeletal:        General: No edema.  Neurological: She is alert and oriented to person, place, and time. No cranial nerve deficit.  Coordination normal.  Strength 5 out of 5 in bilateral upper and lower extremities.  Sensation to light touch intact throughout.  Speech fluent.  Tongue midline. No nuchal rigidity.  Skin: Skin is warm and dry. She is not diaphoretic.    Labs on Admission: I have personally reviewed following labs and imaging studies  CBC: No results for input(s): WBC, NEUTROABS, HGB, HCT, MCV, PLT in the last 168 hours. Basic Metabolic Panel: No results for input(s): NA, K, CL, CO2, GLUCOSE, BUN, CREATININE, CALCIUM, MG, PHOS in the last 168 hours. GFR: Estimated Creatinine Clearance: 140.3 mL/min (by C-G formula based on SCr of 0.61 mg/dL). Liver Function Tests: No results for input(s): AST, ALT, ALKPHOS, BILITOT, PROT, ALBUMIN in the last 168 hours. No results for input(s): LIPASE, AMYLASE in the last 168 hours. No results for input(s): AMMONIA in the last 168 hours. Coagulation Profile: No results for input(s): INR, PROTIME in the last 168 hours. Cardiac Enzymes: No results for input(s): CKTOTAL, CKMB, CKMBINDEX, TROPONINI in the last 168 hours. BNP (last 3 results) No results for input(s): PROBNP in the last 8760 hours. HbA1C: No results for input(s): HGBA1C in the last 72 hours. CBG: No results for input(s): GLUCAP in the last 168 hours. Lipid Profile: No results for input(s): CHOL, HDL, LDLCALC, TRIG, CHOLHDL, LDLDIRECT in the last 72 hours. Thyroid Function Tests: No results for input(s): TSH, T4TOTAL, FREET4, T3FREE, THYROIDAB in the last 72 hours. Anemia Panel: No results for input(s): VITAMINB12, FOLATE, FERRITIN, TIBC, IRON, RETICCTPCT in the last 72 hours. Urine analysis:    Component Value Date/Time   COLORURINE YELLOW 06/14/2018 0907   APPEARANCEUR CLEAR 06/14/2018 0907   LABSPEC 1.017 06/14/2018 0907   PHURINE 5.0 06/14/2018 0907   GLUCOSEU NEGATIVE 06/14/2018 0907   HGBUR NEGATIVE 06/14/2018 0907   BILIRUBINUR NEGATIVE 06/14/2018 0907   KETONESUR NEGATIVE 06/14/2018 0907     PROTEINUR NEGATIVE 06/14/2018 0907   UROBILINOGEN 1.0 05/04/2015 0349   NITRITE NEGATIVE 06/14/2018 0907   LEUKOCYTESUR NEGATIVE 06/14/2018 1610    Radiological Exams on Admission: No results found.  Assessment/Plan Principal Problem:   Headache Active Problems:   Hypothyroidism   Anxiety   Essential hypertension   Vaginal bleeding   Intractable headaches Patient is presenting as a transfer from an outside hospital for management of occipital headaches.  Per chart review, she was previously admitted in July  2019 for CSF leak and lumbar drain placement.  She had a prolonged and complicated hospital course and was admitted to the ICU at that time.  Per conversation with the physician who accepted the transfer, it was communicated to him by the provider at the outside hospital that the patient's head CT was negative for any acute finding.  However, patient states that she was told at the outside hospital that her head CT scan was abnormal.  Patient currently appears comfortable on exam and in no acute distress.  Neuro exam nonfocal.  No nuchal rigidity.  Patient is afebrile.   -Stat brain and C-spine MRI pending -STAT labs: CBC, CMP -Toradol PRN, Compazine as needed, Benadryl as needed.  Check renal function before ordering Toradol.  Vaginal bleeding Patient states she was recently pregnant and was told around Christmas time that she had a miscarriage.  States she was placed on 10-day course of a medication which she has finished but continues to have vaginal bleeding.  Her OB/GYN is in [place]. -Check beta-hCG level -Consult OB/GYN in the morning for recommendations. -Hold anticoagulation for DVT prophylaxis  History of anxiety, depression -Unable to order home meds safely as pharmacy medication reconciliation pending at this time.  Hypertension Currently normotensive. -Unable to order home meds safely as pharmacy medication reconciliation pending at this time.  History of  hypothyroidism No home medication listed. -Check TSH, free T4 -Unable to order home meds safely as pharmacy medication reconciliation pending at this time.  DVT prophylaxis: SCDs Code Status: Full code Family Communication: Family at bedside. Disposition Plan: Anticipate discharge in 1 to 2 days. Consults called: None Admission status: Observation   John GiovanniVasundhra Davina Howlett MD [facility] Pager [phone]  If 7PM-7AM, please contact night-coverage [email or web address] Password [email or web address]  07/02/2018, 10:02 PM

## 2018-07-03 ENCOUNTER — Encounter (HOSPITAL_COMMUNITY): Payer: Self-pay

## 2018-07-03 ENCOUNTER — Other Ambulatory Visit: Payer: Self-pay

## 2018-07-03 DIAGNOSIS — E039 Hypothyroidism, unspecified: Secondary | ICD-10-CM | POA: Diagnosis not present

## 2018-07-03 DIAGNOSIS — G44211 Episodic tension-type headache, intractable: Secondary | ICD-10-CM

## 2018-07-03 DIAGNOSIS — F419 Anxiety disorder, unspecified: Secondary | ICD-10-CM | POA: Diagnosis not present

## 2018-07-03 DIAGNOSIS — I10 Essential (primary) hypertension: Secondary | ICD-10-CM | POA: Diagnosis not present

## 2018-07-03 DIAGNOSIS — R51 Headache: Secondary | ICD-10-CM | POA: Diagnosis not present

## 2018-07-03 LAB — T4, FREE: Free T4: 0.68 ng/dL — ABNORMAL LOW (ref 0.82–1.77)

## 2018-07-03 LAB — TSH: TSH: 1.617 u[IU]/mL (ref 0.350–4.500)

## 2018-07-03 MED ORDER — DEXAMETHASONE SODIUM PHOSPHATE 4 MG/ML IJ SOLN
4.0000 mg | Freq: Once | INTRAMUSCULAR | Status: AC
  Administered 2018-07-03: 4 mg via INTRAVENOUS
  Filled 2018-07-03: qty 1

## 2018-07-03 MED ORDER — KETOROLAC TROMETHAMINE 30 MG/ML IJ SOLN
30.0000 mg | Freq: Once | INTRAMUSCULAR | Status: AC
  Administered 2018-07-03: 30 mg via INTRAVENOUS
  Filled 2018-07-03: qty 1

## 2018-07-03 MED ORDER — HYDRALAZINE HCL 20 MG/ML IJ SOLN: 5.0000 mg | INTRAMUSCULAR | Status: DC | PRN

## 2018-07-03 MED ORDER — KETOROLAC TROMETHAMINE 30 MG/ML IJ SOLN: 30.0000 mg | Freq: Four times a day (QID) | INTRAMUSCULAR | Status: DC | PRN

## 2018-07-03 MED ORDER — SUMATRIPTAN SUCCINATE 25 MG PO TABS
25.0000 mg | ORAL_TABLET | Freq: Once | ORAL | Status: AC
  Administered 2018-07-03: 25 mg via ORAL
  Filled 2018-07-03: qty 1

## 2018-07-03 NOTE — Plan of Care (Signed)

## 2018-07-03 NOTE — Progress Notes (Signed)
Pt off floor for swallow evaluation

## 2018-07-03 NOTE — Care Management Note (Signed)
Case Management Note  Patient Details  Name: Sonya Gibson MRN: [id number] Date of Birth: [birth year]  Subjective/Objective:     Pt in with headache. She is from home with spouse.  [employer] as insurance    PCP: Dr Alvis Lemmings            Action/Plan: Pt discharging home with self care. No new medications.  Pt has transportation home.   Expected Discharge Date:  07/03/18               Expected Discharge Plan:  Home/Self Care  In-House Referral:     Discharge planning Services     Post Acute Care Choice:    Choice offered to:     DME Arranged:    DME Agency:     HH Arranged:    HH Agency:     Status of Service:  Completed, signed off  If discussed at [employer] of Stay Meetings, dates discussed:    Additional Comments:  Kermit Balo, RN 07/03/2018, 3:27 PM

## 2018-07-03 NOTE — Progress Notes (Signed)
Patient ready for discharge home with family. Pt given discharge instructions, reviewed post care , medications and f/u appointments.

## 2018-07-03 NOTE — Discharge Summary (Addendum)
Physician Discharge Summary  Sonya Gibson VZC:[id number] DOB: 1986/04/26 DOA: 07/02/2018  PCP: Hoy Register, MD  Admit date: 07/02/2018 Discharge date: 07/03/2018  Time spent: 45 minutes  Recommendations for Outpatient Follow-up:  1. Neurology will contact patient to schedule an appointment for 2-4 weeks out 2. HgbA1c   Discharge Diagnoses:  Principal Problem:   Headache Active Problems:   Essential hypertension   Hypothyroidism   Anxiety   Discharge Condition: stable  Diet recommendation: heart healthy  Filed Weights   07/02/18 1932  Weight: (!) 148.3 kg    History of present illness:  33 yo hx anxiety depression, PTSD, htn gerd hypothyroidism, migraines admitted intractable headache. Went to ED in [place] and transferred to [facility].   Hospital Course:  Intractable headaches Patient  presented as a transfer from an outside hospital for management of occipital headaches.  Per chart review, she was previously admitted in July 2019 for CSF leak and lumbar drain placement.  She had a prolonged and complicated hospital course and was admitted to the ICU at that time. Reason for exam not altogether clear.  Neuro exam nonfocal during hospitalization. Reportedly CT head normal from [place].  No nuchal rigidity. She remained afebrile. MRI of brain normal. MRI c-spine No acute osseous or cord signal abnormality. Minimal discogenic degenerative changes of the cervical spine. No significant foraminal or canal stenosis. She was provided Toradol PRN, Compazine as needed, Benadryl as well as imitrex and decadron x1. At time of discharge reports headache much improve. Have requested neurology to contact patient for OP follow up appointment   History of anxiety, depression. Stable  Home meds include effexor. Continue home meds  Hypertension remained normotensive.  History of hypothyroidism. TSH 1.6 and T4 0.68.  Procedures:  Consultations:    Discharge Exam: Vitals:    07/03/18 0837 07/03/18 1225  BP: 120/69 126/77  Pulse: 62 87  Resp: 17 20  Temp: 97.7 F (36.5 C) 97.6 F (36.4 C)  SpO2: 95% 96%    General: obese, lying in bed eyes closed aroused to verbal stimuli. No acute distress Cardiovascular: rrr no mgr no LE edema Respiratory: normal effort BS clear bilaterally no wheeze or rhonci  Discharge Instructions   Discharge Instructions    Ambulatory referral to Neurology   Complete by:  As directed    An appointment is requested in approximately: 2-4 weeks. headaches   Diet - low sodium heart healthy   Complete by:  As directed    Discharge instructions   Complete by:  As directed    Neurology will contact you for appointment 2-4 weeks   Increase activity slowly   Complete by:  As directed      Allergies as of 07/03/2018      Reactions   Cinnamon Anaphylaxis   (NO "REAL"/TREE BARK CINNAMON)   Coconut Oil Anaphylaxis   Can eat "fake" coconut   Lovenox [enoxaparin] Other (See Comments)   BROKE DOWN THE SKIN AT INJECTION SITE AND CAUSED A WOUND   Nitrofurantoin Hives   Other    "Headache cocktail"   Blister to abd      Medication List    STOP taking these medications   clonazePAM 2 MG tablet Commonly known as:  KLONOPIN   gabapentin 300 MG capsule Commonly known as:  NEURONTIN   metoCLOPramide 10 MG tablet Commonly known as:  REGLAN     TAKE these medications   acetaminophen 325 MG tablet Commonly known as:  TYLENOL Take 650 mg by mouth  every 6 (six) hours as needed.   Butalbital-APAP-Caffeine 50-300-40 MG Caps Take 1 capsule by mouth every 4 (four) hours as needed (headache).   calcium carbonate 750 MG chewable tablet Commonly known as:  TUMS EX Chew 2 tablets by mouth as needed for heartburn.   cyanocobalamin 1000 MCG/ML injection Commonly known as:  (VITAMIN B-12) Inject 1,000 mcg into the muscle See admin instructions. Every two weeks   cyclobenzaprine 10 MG tablet Commonly known as:  FLEXERIL Take 1 tablet  (10 mg total) by mouth 3 (three) times daily as needed for muscle spasms.   Melatonin 10 MG Tabs Take 10 mg by mouth daily as needed (sleep).   metFORMIN 500 MG tablet Commonly known as:  GLUCOPHAGE Take 1 tablet by mouth 2 (two) times daily with a meal.   ondansetron 8 MG disintegrating tablet Commonly known as:  ZOFRAN ODT Take 1 tablet (8 mg total) by mouth every 8 (eight) hours as needed for up to 20 doses for nausea or vomiting.   traZODone 150 MG tablet Commonly known as:  DESYREL Take 150 mg by mouth at bedtime as needed for sleep.   venlafaxine XR 150 MG 24 hr capsule Commonly known as:  EFFEXOR-XR Take 150 mg by mouth at bedtime.   venlafaxine XR 75 MG 24 hr capsule Commonly known as:  EFFEXOR-XR Take 75 mg by mouth at bedtime.   VENTOLIN HFA 108 (90 Base) MCG/ACT inhaler Generic drug:  albuterol Inhale 2 puffs into the lungs every 6 (six) hours as needed for wheezing or shortness of breath.   Vitamin D (Ergocalciferol) 1.25 MG (50000 UT) Caps capsule Commonly known as:  DRISDOL Take 50,000 Units by mouth every 7 (seven) days.      Allergies  Allergen Reactions  . Cinnamon Anaphylaxis    (NO "REAL"/TREE BARK CINNAMON)  . Coconut Oil Anaphylaxis    Can eat "fake" coconut  . Lovenox [Enoxaparin] Other (See Comments)    BROKE DOWN THE SKIN AT INJECTION SITE AND CAUSED A WOUND  . Nitrofurantoin Hives  . Other     "Headache cocktail"   Blister to abd      The results of significant diagnostics from this hospitalization (including imaging, microbiology, ancillary and laboratory) are listed below for reference.    Significant Diagnostic Studies: Mr Brain Wo Contrast  Result Date: 07/03/2018 CLINICAL DATA:  33 y/o F; headache. History of migraine and spinal fluid leak in the past. EXAM: MRI HEAD WITHOUT CONTRAST MRI CERVICAL SPINE WITHOUT CONTRAST TECHNIQUE: Multiplanar, multiecho pulse sequences of the brain and surrounding structures, and cervical spine, to  include the craniocervical junction and cervicothoracic junction, were obtained without intravenous contrast. COMPARISON:  03/28/2018 MRI head. FINDINGS: MRI HEAD FINDINGS Brain: No acute infarction, hemorrhage, hydrocephalus, extra-axial collection or mass lesion. No structural or signal abnormality of the brain. Vascular: Normal flow voids. Skull and upper cervical spine: Normal marrow signal. Sinuses/Orbits: Negative. Other: None. MRI CERVICAL SPINE FINDINGS Alignment: Straightening of cervical lordosis without listhesis. Vertebrae: No fracture, evidence of discitis, or bone lesion. 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 Cord: Normal signal and morphology. Posterior Fossa, vertebral arteries, paraspinal tissues: Negative. Disc levels: Tiny C3-4 central protrusion with ventral thecal sac effacement. Mild loss of intervertebral disc space height the C4-C6 levels. Otherwise no significant disc displacement, foraminal stenosis, or canal stenosis. IMPRESSION: MRI head: Stable normal MRI of the head.  No acute process identified. MRI cervical spine: 1. No acute osseous or cord signal abnormality. 2.  Minimal discogenic degenerative changes of the cervical spine. No significant foraminal or canal stenosis. Electronically Signed   By: Mitzi HansenLance  Furusawa-Stratton M.D.   On: 07/03/2018 00:48   Mr Cervical Spine Wo Contrast  Result Date: 07/03/2018 CLINICAL DATA:  33 y/o F; headache. History of migraine and spinal fluid leak in the past. EXAM: MRI HEAD WITHOUT CONTRAST MRI CERVICAL SPINE WITHOUT CONTRAST TECHNIQUE: Multiplanar, multiecho pulse sequences of the brain and surrounding structures, and cervical spine, to include the craniocervical junction and cervicothoracic junction, were obtained without intravenous contrast. COMPARISON:  03/28/2018 MRI head. FINDINGS: MRI HEAD FINDINGS Brain: No acute infarction, hemorrhage, hydrocephalus, extra-axial collection or mass lesion. No structural or signal  abnormality of the brain. Vascular: Normal flow voids. Skull and upper cervical spine: Normal marrow signal. Sinuses/Orbits: Negative. Other: None. MRI CERVICAL SPINE FINDINGS Alignment: Straightening of cervical lordosis without listhesis. Vertebrae: No fracture, evidence of discitis, or bone lesion. 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 Cord: Normal signal and morphology. Posterior Fossa, vertebral arteries, paraspinal tissues: Negative. Disc levels: Tiny C3-4 central protrusion with ventral thecal sac effacement. Mild loss of intervertebral disc space height the C4-C6 levels. Otherwise no significant disc displacement, foraminal stenosis, or canal stenosis. IMPRESSION: MRI head: Stable normal MRI of the head.  No acute process identified. MRI cervical spine: 1. No acute osseous or cord signal abnormality. 2. Minimal discogenic degenerative changes of the cervical spine. No significant foraminal or canal stenosis. Electronically Signed   By: Mitzi HansenLance  Furusawa-Stratton M.D.   On: 07/03/2018 00:48   [place] Ob Comp < 14 Wks  Result Date: 06/14/2018 CLINICAL DATA:  Pelvic pain and pressure for the past day. Estimated gestational age of [redacted] weeks, 1 day by LMP. EXAM: OBSTETRIC <14 WK US AND TRANSVAGINAL OB US TECHNIQUE: Both transabdominal and transvaginal ultrasound examinations were performed for complete evaluation of the gestation as well as the maternal uterus, adnexal regions, and pelvic cul-de-sac. Transvaginal technique was performed to assess early pregnancy. COMPARISON:  CT abdomen pelvis dated February 20, 2018. FINDINGS: Intrauterine gestational sac: None Yolk sac:  Not Visualized. Embryo:  Not Visualized. Maternal uterus/adnexae: Unremarkable. Trace free fluid in the pelvis, likely physiologic. IMPRESSION: 1.  No IUP is visualized. By definition, in the setting of a positive pregnancy test, this reflects a pregnancy of unknown location. Differential considerations include early normal  IUP, abnormal IUP/missed abortion, or nonvisualized ectopic pregnancy. Serial beta HCG is suggested. Consider repeat pelvic ultrasound in 14 days. Electronically Signed   By: Obie DredgeWilliam T Derry M.D.   On: 06/14/2018 11:38   [place] Ob Transvaginal  Result Date: 06/14/2018 CLINICAL DATA:  Pelvic pain and pressure for the past day. Estimated gestational age of [redacted] weeks, 1 day by LMP. EXAM: OBSTETRIC <14 WK US AND TRANSVAGINAL OB US TECHNIQUE: Both transabdominal and transvaginal ultrasound examinations were performed for complete evaluation of the gestation as well as the maternal uterus, adnexal regions, and pelvic cul-de-sac. Transvaginal technique was performed to assess early pregnancy. COMPARISON:  CT abdomen pelvis dated February 20, 2018. FINDINGS: Intrauterine gestational sac: None Yolk sac:  Not Visualized. Embryo:  Not Visualized. Maternal uterus/adnexae: Unremarkable. Trace free fluid in the pelvis, likely physiologic. IMPRESSION: 1.  No IUP is visualized. By definition, in the setting of a positive pregnancy test, this reflects a pregnancy of unknown location. Differential considerations include early normal IUP, abnormal IUP/missed abortion, or nonvisualized ectopic pregnancy. Serial beta HCG is suggested. Consider repeat pelvic ultrasound in 14 days. Electronically Signed   By: Chrissie NoaWilliam  Howell Pringle M.D.   On: 06/14/2018 11:38    Microbiology: No results found for this or any previous visit (from the past 240 hour(s)).   Labs: Basic Metabolic Panel: Recent Labs  Lab 07/02/18 2148  NA 132*  K 3.8  CL 100  CO2 24  GLUCOSE 164*  BUN 6  CREATININE 0.76  CALCIUM 8.5*   Liver Function Tests: Recent Labs  Lab 07/02/18 2148  AST 37  ALT 38  ALKPHOS 65  BILITOT 0.5  PROT 7.0  ALBUMIN 3.5   No results for input(s): LIPASE, AMYLASE in the last 168 hours. No results for input(s): AMMONIA in the last 168 hours. CBC: Recent Labs  Lab 07/02/18 2148  WBC 9.1  HGB 11.8*  HCT 35.9*  MCV 88.9   PLT 377   Cardiac Enzymes: No results for input(s): CKTOTAL, CKMB, CKMBINDEX, TROPONINI in the last 168 hours. BNP: BNP (last 3 results) No results for input(s): BNP in the last 8760 hours.  ProBNP (last 3 results) No results for input(s): PROBNP in the last 8760 hours.  CBG: No results for input(s): GLUCAP in the last 168 hours.     Signed:  Gwenyth Bender NP.  [facility] 07/03/2018, 3:15 PM   Patient was seen, examined,treatment plan was discussed with the Advance Practice Provider.  I have personally reviewed the clinical findings, labs, EKG, imaging studies and management of this patient in detail. I have also reviewed the orders written for this patient which were under my direction. I agree with the documentation, as recorded by the Advance Practice Provider.   CORRENE KOEHN is a 33 y.o. female  Here with migraine symptoms.  Has a long history of migraines and has been on several different types of treatment.  Referral made to neurology for headaches as prior patient had been on Topamax but it made the patient grind her teeth.  Patient to keep headache diary.  Obesity Estimated body mass index is 61.78 kg/m as calculated from the following:   Height as of this encounter: 5\' 1"  (1.549 m).   Weight as of this encounter: 148.3 kg.  Joseph Art, DO

## 2018-07-06 ENCOUNTER — Telehealth: Payer: Self-pay | Admitting: Neurology

## 2018-07-06 ENCOUNTER — Ambulatory Visit: Payer: BLUE CROSS/BLUE SHIELD | Admitting: Neurology

## 2018-07-06 ENCOUNTER — Encounter: Payer: Self-pay | Admitting: Neurology

## 2018-07-06 VITALS — BP 136/96 | HR 108 | Ht 61.0 in | Wt 324.0 lb

## 2018-07-06 DIAGNOSIS — R51 Headache with orthostatic component, not elsewhere classified: Secondary | ICD-10-CM

## 2018-07-06 DIAGNOSIS — G9782 Other postprocedural complications and disorders of nervous system: Secondary | ICD-10-CM | POA: Diagnosis not present

## 2018-07-06 MED ORDER — OXYCODONE-ACETAMINOPHEN 10-325 MG PO TABS: 1.0000 | ORAL_TABLET | Freq: Four times a day (QID) | ORAL | 0 refills | Status: DC | PRN

## 2018-07-06 MED ORDER — PROMETHAZINE HCL 25 MG PO TABS: 25.0000 mg | ORAL_TABLET | Freq: Four times a day (QID) | ORAL | 2 refills | Status: DC | PRN

## 2018-07-06 NOTE — Patient Instructions (Addendum)
We will get you in to see Sonya Gibson at [facility] Persistent low pressure headache causing pseudo-chiari and neuro-visual symptoms   Chiari Malformation  Chiari malformation (CM) is a type of brain abnormality that affects the parts of the brain called the cerebellum and the brain stem. The cerebellum is important for balance and the brain stem is important for basic body functions, such as breathing and swallowing. Normally, the cerebellum is located in a space at the back of the skull, just above the opening in the skull (foramen magnum)where the spinal cord meets the brain stem. With CM, part of the cerebellum is located below the foramen magnum instead. The malformation can be mild with no or few symptoms, or it can be severe. CM can cause neck pain, headaches, balance problems, and other symptoms. What are the causes? CM is a condition that a person is born with (congenital). In rare cases, CM may also develop later in life (acquired CM or secondary CM). These cases may be caused by a leak of the fluid (cerebrospinal fluid) around the brain and spinal cord, leading to low pressure. In acquired or secondary CM, abnormal pressure develops in the brain. This pushes the cerebellum down into the foramen magnum. What increases the risk? The following factors may make you more likely to develop this condition:  Being female.  Having a family history of CM. What are the signs or symptoms? Symptoms of this condition may vary depending on the severity of your CM. In some cases, you may not have any symptoms. In other cases, symptoms may come and go. The most common symptom is a severe headache in the back of the head. The headache:  May come and go.  May spread to your neck and shoulders.  May be worse when you cough, sneeze, or strain. Other symptoms include:  Difficulty balancing.  Loss of coordination.  Trouble swallowing or speaking.  Muscle weakness.  Feeling dizzy.  Ringing in the  ears.  Fainting.  Trouble sleeping.  Fatigue.  Tingling or burning sensations in the fingers or toes.  Hearing problems.  Vision problems.  Vomiting.  Depression.  Seizures, in severe cases. How is this diagnosed? This condition may be evaluated with a medical history and physical exam. This may include tests to check your balance and nerves (neurological exam). You may also have imaging tests, such as CT scan or MRI. How is this treated? Treatment for this condition depends on the severity of your symptoms. You may be treated with:  Surgery to prevent the malformation from getting worse, or to treat severe symptoms or symptoms that are getting worse.  Medicines or alternative treatments to relieve headaches or neck pain. If you do not have symptoms, you may not need treatment. Follow these instructions at home: If you have seizures:  Do not drive, swim, or do any other activities that would be dangerous if you had a seizure. Wait until your health care provider says it is safe to do them.  Avoid any substances that may prevent your medicine from working properly, such as alcohol.  Check with your local Department of Motor Vehicles [facility]) to find out about local driving laws. Each state has specific rules about when you can legally return to driving.  Get enough rest. Lack of sleep can make seizures more likely to occur. Medicines  Take over-the-counter and prescription medicines only as told by your health care provider.  Do not drive or use heavy machinery while taking prescription pain  medicine.  If you are taking blood pressure or heart medicine, get up slowly and take several minutes to sit and then stand. This can reduce dizziness. General instructions  If you feel like you might faint: ? Lie down right away and raise (elevate) your feet above the level of your heart. ? Breathe deeply and steadily. Wait until all of the symptoms have passed.  Ask your health  care provider which activities are safe for you, and if you have any activity restrictions.  Do not use any products that contain nicotine or tobacco, such as cigarettes and e-cigarettes. If you need help quitting, ask your health care provider.  Drink enough fluid to keep your urine pale yellow.  Consider joining a CM support group.  Keep all follow-up visits as told by your health care provider. This is important. Contact a health care provider if:  You have new symptoms.  Your symptoms get worse. Get help right away if:  You have seizures that are new or different from other seizures that you have had.  You develop weakness or numbness in one or all of your limbs.  You develop dizziness, slurred speech, double vision, weakness, or numbness with a severe headache. Summary  A Chiari malformation is a condition in which part of the cerebellum moves through the foramen magnum.  The malformation can be mild with no symptoms, or it can be severe.  In some patients, no treatment is needed. In others, medicines are used to treat headaches. Surgery is done in the worst of cases. This information is not intended to replace advice given to you by your health care provider. Make sure you discuss any questions you have with your health care provider. Document Released: 05/27/2002 Document Revised: 06/08/2017 Document Reviewed: 03/15/2017 Elsevier Interactive Patient Education  2019 [employer]  Acetaminophen; Oxycodone tablets What is this medicine? ACETAMINOPHEN; OXYCODONE (a set a MEE noe fen; ox i KOE done) is a pain reliever. It is used to treat moderate to severe pain. This medicine may be used for other purposes; ask your health care provider or pharmacist if you have questions. COMMON BRAND NAME(S): Endocet, Magnacet, Nalocet, Narvox, Percocet, Perloxx, Primalev, Primlev, Roxicet, Xolox What should I tell my health care provider before I take this medicine? They need to know if  you have any of these conditions: -brain tumor -Crohn's disease, inflammatory bowel disease, or ulcerative colitis -drug abuse or addiction -head injury -heart or circulation problems -if you often drink alcohol -kidney disease or problems going to the bathroom -liver disease -lung disease, asthma, or breathing problems -an unusual or allergic reaction to acetaminophen, oxycodone, other opioid analgesics, other medicines, foods, dyes, or preservatives -pregnant or trying to get pregnant -breast-feeding How should I use this medicine? Take this medicine by mouth with a full glass of water. Follow the directions on the prescription label. You can take it with or without food. If it upsets your stomach, take it with food. Take your medicine at regular intervals. Do not take it more often than directed. A special MedGuide will be given to you by the pharmacist with each prescription and refill. Be sure to read this information carefully each time. Talk to your pediatrician regarding the use of this medicine in children. Special care may be needed. Overdosage: If you think you have taken too much of this medicine contact a poison control center or emergency room at once. NOTE: This medicine is only for you. Do not share this medicine with others.  What if I miss a dose? If you miss a dose, take it as soon as you can. If it is almost time for your next dose, take only that dose. Do not take double or extra doses. What may interact with this medicine? This medicine may interact with the following medications: -alcohol -antihistamines for allergy, cough and cold -antiviral medicines for HIV or AIDS -atropine -certain antibiotics like clarithromycin, erythromycin, linezolid, rifampin -certain medicines for anxiety or sleep -certain medicines for bladder problems like oxybutynin, tolterodine -certain medicines for depression like amitriptyline, fluoxetine, sertraline -certain medicines for fungal  infections like ketoconazole, itraconazole, voriconazole -certain medicines for migraine headache like almotriptan, eletriptan, frovatriptan, naratriptan, rizatriptan, sumatriptan, zolmitriptan -certain medicines for nausea or vomiting like dolasetron, ondansetron, palonosetron -certain medicines for Parkinson's disease like benztropine, trihexyphenidyl -certain medicines for seizures like phenobarbital, phenytoin, primidone -certain medicines for stomach problems like dicyclomine, hyoscyamine -certain medicines for travel sickness like scopolamine -diuretics -general anesthetics like halothane, isoflurane, methoxyflurane, propofol -ipratropium -local anesthetics like lidocaine, pramoxine, tetracaine -MAOIs like Carbex, Eldepryl, Marplan, Nardil, and Parnate -medicines that relax muscles for surgery -methylene blue -nilotinib -other medicines with acetaminophen -other narcotic medicines for pain or cough -phenothiazines like chlorpromazine, mesoridazine, prochlorperazine, thioridazine This list may not describe all possible interactions. Give your health care provider a list of all the medicines, herbs, non-prescription drugs, or dietary supplements you use. Also tell them if you smoke, drink alcohol, or use illegal drugs. Some items may interact with your medicine. What should I watch for while using this medicine? Tell your doctor or health care professional if your pain does not go away, if it gets worse, or if you have new or a different type of pain. You may develop tolerance to the medicine. Tolerance means that you will need a higher dose of the medication for pain relief. Tolerance is normal and is expected if you take this medicine for a long time. Do not suddenly stop taking your medicine because you may develop a severe reaction. Your body becomes used to the medicine. This does NOT mean you are addicted. Addiction is a behavior related to getting and using a drug for a non-medical  reason. If you have pain, you have a medical reason to take pain medicine. Your doctor will tell you how much medicine to take. If your doctor wants you to stop the medicine, the dose will be slowly lowered over time to avoid any side effects. There are different types of narcotic medicines (opiates). If you take more than one type at the same time or if you are taking another medicine that also causes drowsiness, you may have more side effects. Give your health care provider a list of all medicines you use. Your doctor will tell you how much medicine to take. Do not take more medicine than directed. Call emergency for help if you have problems breathing or unusual sleepiness. Do not take other medicines that contain acetaminophen with this medicine. Always read labels carefully. If you have questions, ask your doctor or pharmacist. If you take too much acetaminophen get medical help right away. Too much acetaminophen can be very dangerous and cause liver damage. Even if you do not have symptoms, it is important to get help right away. You may get drowsy or dizzy. Do not drive, use machinery, or do anything that needs mental alertness until you know how this medicine affects you. Do not stand or sit up quickly, especially if you are an older patient. This reduces the risk  of dizzy or fainting spells. Alcohol may interfere with the effect of this medicine. Avoid alcoholic drinks. The medicine will cause constipation. Try to have a bowel movement at least every 2 to 3 days. If you do not have a bowel movement for 3 days, call your doctor or health care professional. Your mouth may get dry. Chewing sugarless gum or sucking hard candy, and drinking plenty or water may help. Contact your doctor if the problem does not go away or is severe. What side effects may I notice from receiving this medicine? Side effects that you should report to your doctor or health care professional as soon as possible: -allergic  reactions like skin rash, itching or hives, swelling of the face, lips, or tongue -breathing problems -confusion -redness, blistering, peeling or loosening of the skin, including inside the mouth -signs and symptoms of liver injury like dark yellow or brown urine; general ill feeling or flu-like symptoms; light-colored stools; loss of appetite; nausea; right upper belly pain; unusually weak or tired; yellowing of the eyes or skin -signs and symptoms of low blood pressure like dizziness; feeling faint or lightheaded, falls; unusually weak or tired -trouble passing urine or change in the amount of urine Side effects that usually do not require medical attention (report to your doctor or health care professional if they continue or are bothersome): -constipation -dry mouth -nausea, vomiting -tiredness This list may not describe all possible side effects. Call your doctor for medical advice about side effects. You may report side effects to FDA at 1-800-FDA-1088. Where should I keep my medicine? Keep out of the reach of children. This medicine can be abused. Keep your medicine in a safe place to protect it from theft. Do not share this medicine with anyone. Selling or giving away this medicine is dangerous and against the law. Store at room temperature between 20 and 25 degrees C (68 and 77 degrees F). This medicine may cause harm and death if it is taken by other adults, children, or pets. Return medicine that has not been used to an official disposal site. Contact the DEA at 225-798-6427 or your city/county government to find a site. If you cannot return the medicine, flush it down the toilet. Do not use the medicine after the expiration date. NOTE: This sheet is a summary. It may not cover all possible information. If you have questions about this medicine, talk to your doctor, pharmacist, or health care provider.  2019 Elsevier/[employer] Standard (2016-10-11 15:46:38)

## 2018-07-06 NOTE — Telephone Encounter (Signed)
Error

## 2018-07-06 NOTE — Progress Notes (Signed)
[facility]    Provider:  Dr Lucia Gaskins Referring Provider: Hoy Register, MD, [name] NP Primary Care Physician:  Patient, No Pcp Per  CC:  Headache, blurry vision, balance issues, muscle jerks, dizzy all after Lumbr Surgery s/p csf leak and multiple admissions for repair with extensive hospitalization  HPI:  Sonya Gibson is a 33 y.o. female here as requested by Dr. Alvis Lemmings for intractable headache. 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 PMHx PTSD, hypothyroidism, depression, anxiety, HTN, Diabetes, migraine, anxiety.  She has had headaches since spinal surgery (see above) started after her first surgery, these are not like her migraines at all.  She would get relief with laying down and worse standing, vision changes, all over the head, imbalance, dizziness and vertigo. It all started in the setting of csf leak and has contnued since then. She has no relief with migraine cocktail or even fioricet. The headache is continuous but some days worse than others. No migraine symptoms such as light or sound sensitivity, again not like previous headaches. The positional nature os becoming less noticeable but still better with laying down and worse with standing. Her headaches are in the back of the head worse but all over, feels like a pressure and a throb, she can hear whooshing noise. Her left eye goes blurry. She has been seeing an ophthalmologist in October and she went to the ED. She has had multiple steroids infusions. She has a band of pressure. Better with laying down. No papilledema was noted in the eye but +RAPD however MRI Brain and c-spine did not show etiology.Prior to this headaches were not daily, maybe one day a month and rarely severe.   Reviewed notes, labs and imaging from outside physicians, which showed:  Reviewed inpatient records.  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  in July 2019 she was admitted repeatedly for pseudomeningocele and taken to the operating room multiple times for lumbar reexploration with repair of CSF leak and placement of lumbar drain.  She had a long and complicated hospital course.  The lumbar drain remained in place for 8 days and a CT myelogram suggested no evidence of leak.  The lumbar drain was removed.  She then once again started to leak spinal fluid from her wound.  She was taken back to the operating room for exploration and placement of a lumboperitoneal shunt which was not not placed but iIt was found that she had torn through her suture line, the dura was resutured, and a lumbar drain was placed.  On discharge Dr. Yetta Barre notes state she had no headache, she had appropriate soreness but no radicular pain.  She was taken off vancomycin which was used prophylactically on the lumbar drain was in place.  Pain was well tolerated with oral pain medications.  Review of Systems: Patient complains of symptoms per HPI as well as the following symptoms: Headache, numbness, weakness, difficulty swallowing, dizziness, passing out, insomnia, sleepiness, snoring, depression, anxiety, too much sleep, not enough sleep, decreased energy, blurred vision, loss of vision, fatigue, ringing in ears, spinning sensation, trouble swallowing, diarrhea, joint pain, aching muscles. Pertinent negatives and positives per HPI. All others negative.   Social History   Socioeconomic History  . Marital status: Married    Spouse name: Not on file  . Number of children: 3  . Years of education: has her  EMS   . Highest education level: Not on file  Occupational History  . Not on file  Social Needs  . Financial resource strain: Not on file  . Food insecurity:    Worry: Not on file    Inability: Not on file  . Transportation needs:    Medical: Not on file    Non-medical: Not on file  Tobacco Use  .  Smoking status: Former Smoker    Packs/day: 2.00    Years: 3.00    Pack years: 6.00    Types: Cigarettes    Last attempt to quit: 2006    Years since quitting: 14.0  . Smokeless tobacco: Never Used  Substance and Sexual Activity  . Alcohol use: Yes    Comment: socially  . Drug use: No  . Sexual activity: Yes    Birth control/protection: None  Lifestyle  . Physical activity:    Days per week: Not on file    Minutes per session: Not on file  . Stress: Not on file  Relationships  . Social connections:    Talks on phone: Not on file    Gets together: Not on file    Attends religious service: Not on file    Active member of club or organization: Not on file    Attends meetings of clubs or organizations: Not on file    Relationship status: Not on file  . Intimate partner violence:    Fear of current or ex partner: Not on file    Emotionally abused: Not on file    Physically abused: Not on file    Forced sexual activity: Not on file  Other Topics Concern  . Not on file  Social History Narrative   Lives at home with husband and kids   Right handed   Caffeine: 6 cups daily    Family History  Problem Relation Age of Onset  . Diabetes Mellitus II Mother   . Hypertension Mother   . Seizures Mother   . Other Mother        post stroke headaches  . Stroke Mother   . Hypertension Father   . Diabetes Mellitus II Father   . Hyperlipidemia Father   . Bipolar disorder Sister   . Asthma Son   . Autism Son   . Tourette syndrome Son   . Autism Daughter        level 3  . Other Daughter        tibia torsion & femoral aniversion    Past Medical History:  Diagnosis Date  . Anxiety   . Chronic back pain   . Depression   . Diabetes (HCC)   . Drug-seeking behavior   . Essential hypertension   . GERD (gastroesophageal reflux disease)   . Hypothyroidism   . Lumbar radiculopathy, right   . Migraine headache   . PTSD (post-traumatic stress disorder)     Past Surgical  History:  Procedure Laterality Date  . ANTERIOR CRUCIATE LIGAMENT REPAIR Left   . BACK SURGERY  12/05/2017  . CESAREAN SECTION    . IR EPIDUROGRAPHY  11/17/2017  . KNEE SURGERY    . LUMBAR LAMINECTOMY/DECOMPRESSION MICRODISCECTOMY Right 12/05/2017   Procedure: Right L4-5 Microdiskectomy;  Surgeon: Tia Alert, MD;  Location: [facility] OR;  Service: Neurosurgery;  Laterality: Right;  . LUMBAR LAMINECTOMY/DECOMPRESSION MICRODISCECTOMY N/A 12/20/2017   Procedure: Repair of CSF Leak;  Surgeon: Tia Alert, MD;  Location: [facility] OR;  Service: Neurosurgery;  Laterality: N/A;  .  LUMBAR WOUND DEBRIDEMENT N/A 12/27/2017   Procedure: Re-exploration for CSF leak repair and placement of lumbar drain;  Surgeon: Tia AlertJones, David S, MD;  [facility];  Service: Neurosurgery;  Laterality: N/A;  . middle finger reatachment    . PLACEMENT OF LUMBAR DRAIN N/A 12/27/2017   Procedure: PLACEMENT OF LUMBAR DRAIN;  Surgeon: Tia AlertJones, David S, MD;  [facility];  Service: Neurosurgery;  Laterality: N/A;  . TONSILLECTOMY AND ADENOIDECTOMY  1996  . VENTRICULOPERITONEAL SHUNT N/A 01/08/2018   Procedure: Re-exploration and repair of previous Lumbar cerebro-spinal fluid leak and placement of Lumbar drain;  Surgeon: Tia AlertJones, David S, MD;  [facility];  Service: Neurosurgery;  Laterality: N/A;    No current facility-administered medications for this visit.    No current outpatient medications on file.   Facility-Administered Medications Ordered in Other Visits  Medication Dose Route Frequency Provider Last Rate Last Dose  . 0.9 % NaCl with KCl 20 mEq/ L  infusion   Intravenous Continuous Tat, David, MD 75 mL/hr at 07/09/18 1450    . acetaminophen (TYLENOL) tablet 650 mg  650 mg Oral Q6H PRN Emokpae, Ejiroghene E, MD   650 mg at 07/09/18 0840   Or  . acetaminophen (TYLENOL) suppository 650 mg  650 mg Rectal Q6H PRN Emokpae, Ejiroghene E, MD      . clonazePAM (KLONOPIN) tablet 2 mg  2 mg Oral TID Emokpae, Ejiroghene E, MD   2 mg  at 07/09/18 1706  . gabapentin (NEURONTIN) capsule 600 mg  600 mg Oral TID Emokpae, Ejiroghene E, MD   600 mg at 07/09/18 1707  . ibuprofen (ADVIL,MOTRIN) tablet 600 mg  600 mg Oral Q6H PRN Emokpae, Ejiroghene E, MD   600 mg at 07/09/18 0840  . insulin aspart (novoLOG) injection 0-9 Units  0-9 Units Subcutaneous TID WC Emokpae, Ejiroghene E, MD   2 Units at 07/09/18 1730  . levETIRAcetam (KEPPRA) IVPB 500 mg/100 mL premix  500 mg Intravenous Q12H Emokpae, Ejiroghene E, MD 400 mL/hr at 07/09/18 1451 500 mg at 07/09/18 1451  . ondansetron (ZOFRAN) injection 4 mg  4 mg Intravenous Rob BuntingQ6H Tat, David, MD   4 mg at 07/09/18 1814  . oxyCODONE-acetaminophen (PERCOCET/ROXICET) 5-325 MG per tablet 1 tablet  1 tablet Oral Q6H PRN Opyd, Lavone Neriimothy S, MD   1 tablet at 07/09/18 1315   And  . oxyCODONE (Oxy IR/ROXICODONE) immediate release tablet 5 mg  5 mg Oral Q6H PRN Opyd, Lavone Neriimothy S, MD   5 mg at 07/09/18 1315  . pantoprazole (PROTONIX) EC tablet 40 mg  40 mg Oral Daily Tat, David, MD   40 mg at 07/09/18 0905  . polyethylene glycol (MIRALAX / GLYCOLAX) packet 17 g  17 g Oral Daily PRN Emokpae, Ejiroghene E, MD      . prenatal multivitamin tablet 1 tablet  1 tablet Oral QHS Emokpae, Ejiroghene E, MD   1 tablet at 07/08/18 2252  . promethazine (PHENERGAN) tablet 25 mg  25 mg Oral Q6H PRN Emokpae, Ejiroghene E, MD      . traZODone (DESYREL) tablet 150 mg  150 mg Oral QHS PRN Emokpae, Ejiroghene E, MD      . venlafaxine XR (EFFEXOR-XR) 24 hr capsule 75 mg  75 mg Oral QHS Tat, Onalee Huaavid, MD       And  . venlafaxine XR (EFFEXOR-XR) 24 hr capsule 150 mg  150 mg Oral Maura CrandallQHS Tat, David, MD        Allergies as of 07/06/2018 - Review  Complete 07/06/2018  Allergen Reaction Noted  . Cinnamon Anaphylaxis 12/20/2017  . Coconut oil Anaphylaxis 08/01/2016  . Lovenox [enoxaparin] Other (See Comments) 12/20/2017  . Nitrofurantoin Hives 04/26/2010  . Other  03/26/2018    Vitals: BP (!) 136/96 (BP Location: Right Arm, Patient  Position: Sitting)   Pulse (!) 108   Ht 5\' 1"  (1.549 m)   Wt (!) 324 lb (147 kg)   LMP 06/29/2018   Breastfeeding Unknown Comment: recent miscarriage, will be trying again  BMI 61.22 kg/m  Last Weight:  Wt Readings from Last 1 Encounters:  07/09/18 (!) 334 lb 14.1 oz (151.9 kg)   Last Height:   Ht Readings from Last 1 Encounters:  07/08/18 5\' 1"  (1.549 m)   Physical exam: Exam: Gen: NAD, conversant, well nourised, obese, well groomed                     CV: RRR, no MRG. No Carotid Bruits. No peripheral edema, warm, nontender Eyes: Conjunctivae clear without exudates or hemorrhage  Neuro: Detailed Neurologic Exam  Speech:    Speech is normal; fluent and spontaneous with normal comprehension.  Cognition:    The patient is oriented to person, place, and time;     recent and remote memory intact;     language fluent;     normal attention, concentration,     fund of knowledge Cranial Nerves:    The pupils are equal, round, and reactive to light. The fundi are normal and spontaneous venous pulsations are present. Decreased left eye, central vision intact otherwise decreased. Extraocular movements are intact. Trigeminal sensation is intact and the muscles of mastication are normal. The face is symmetric. The palate elevates in the midline. Hearing intact. Voice is normal. Shoulder shrug is normal. The tongue has normal motion without fasciculations.   Coordination:    Normal finger to nose and heel to shin. Normal rapid alternating movements.   Gait:    Heel-toe and tandem gait are normal.   Motor Observation:    No asymmetry, no atrophy, and no involuntary movements noted. Tone:    Normal muscle tone.    Posture:    Posture is normal. normal erect    Strength: Right foot weakness dorsiflex mild weakness - chronic; otherwise strength is V/V in the upper and lower limbs.      Sensation: intact to LT     Reflex Exam:  DTR's:    Decreased right patellar otherwise brisk    Toes:    The toes are downgoing bilaterally.   Clonus:    2 beats Ajs   Assessment/Plan:  33 year old patient with positional headahce that started after csf leak and extended hospitalization. She continues to have positional headaches, does not appear to sound like a primary headache disorder. Secondary to low-pressure headache? MRI doe snot show signs of low-pressure headache. Unclear etiology. May need low-csf expert Darcella Cheshire at [facility].   - No evidence on MRI of low-pressure headache however her symptoms appear to have started at that time and continue to be positional - TEMPORARY 1 week opioids per patent request(this is not a headache medication) but will provide temporarily until we can call [facility], nothing else is working per patient, I don;t think this is a primary headache disorder, is very positional and started right after csf leak and extended hospitalization. She may need to have a repeat LP with opening pressure and a repeat CT Myelogram however last was negative (this can happen  when there is a small leak). May need to send her to Darcella Cheshire who is a low-pressure headache expert at [facility] if we can get her in within a few weeks which is unlikely but we can try - Entire periphery left eye decreased vision, this can happen in low-pressure headaches as well. No other causes on MRI brain. Follow up with Ophthalmology.  To prevent or relieve headaches, try the following: Cool Compress. Lie down and place a cool compress on your head.  Avoid headache triggers. If certain foods or odors seem to have triggered your migraines in the past, avoid them. A headache diary might help you identify triggers.  Include physical activity in your daily routine. Try a daily walk or other moderate aerobic exercise.  Manage stress. Find healthy ways to cope with the stressors, such as delegating tasks on your to-do list.  Practice relaxation techniques. Try deep breathing, yoga, massage and visualization.    Eat regularly. Eating regularly scheduled meals and maintaining a healthy diet might help prevent headaches. Also, drink plenty of fluids.  Follow a regular sleep schedule. Sleep deprivation might contribute to headaches Consider biofeedback. With this mind-body technique, you learn to control certain bodily functions - such as muscle tension, heart rate and blood pressure - to prevent headaches or reduce headache pain.    Proceed to emergency room if you experience new or worsening symptoms or symptoms do not resolve, if you have new neurologic symptoms or if headache is severe, or for any concerning symptom.   Provided education and documentation from American headache Society toolbox including articles on: chronic migraine medication overuse headache, chronic migraines, prevention of migraines, behavioral and other nonpharmacologic treatments for headache.  A total of 80 minutes was spent face-to-face with this patient. Over half this time was spent on counseling patient on the  1. Postoperative CSF leak   2. Headache due to low cerebrospinal fluid pressure     diagnosis and different diagnostic and therapeutic options, counseling and coordination of care, risks ans benefits of management, compliance, or risk factor reduction and education.      Naomie Dean, MD  [facility] [address] [place], [place] [address]  Phone [phone] Fax [phone]

## 2018-07-08 ENCOUNTER — Observation Stay (HOSPITAL_COMMUNITY)
Admission: EM | Admit: 2018-07-08 | Discharge: 2018-07-10 | Disposition: A | Discharge status: Home / Self Care | Payer: BLUE CROSS/BLUE SHIELD | Attending: Internal Medicine | Admitting: Internal Medicine

## 2018-07-08 ENCOUNTER — Encounter (HOSPITAL_COMMUNITY): Payer: Self-pay | Admitting: Emergency Medicine

## 2018-07-08 ENCOUNTER — Other Ambulatory Visit: Payer: Self-pay

## 2018-07-08 DIAGNOSIS — F419 Anxiety disorder, unspecified: Secondary | ICD-10-CM | POA: Insufficient documentation

## 2018-07-08 DIAGNOSIS — Z6841 Body Mass Index (BMI) 40.0 and over, adult: Secondary | ICD-10-CM

## 2018-07-08 DIAGNOSIS — F4310 Post-traumatic stress disorder, unspecified: Secondary | ICD-10-CM | POA: Diagnosis not present

## 2018-07-08 DIAGNOSIS — Z7984 Long term (current) use of oral hypoglycemic drugs: Secondary | ICD-10-CM | POA: Insufficient documentation

## 2018-07-08 DIAGNOSIS — Z87891 Personal history of nicotine dependence: Secondary | ICD-10-CM | POA: Insufficient documentation

## 2018-07-08 DIAGNOSIS — R000 Tachycardia, unspecified: Secondary | ICD-10-CM | POA: Diagnosis not present

## 2018-07-08 DIAGNOSIS — G40909 Epilepsy, unspecified, not intractable, without status epilepticus: Principal | ICD-10-CM | POA: Insufficient documentation

## 2018-07-08 DIAGNOSIS — R569 Unspecified convulsions: Secondary | ICD-10-CM | POA: Diagnosis not present

## 2018-07-08 DIAGNOSIS — E039 Hypothyroidism, unspecified: Secondary | ICD-10-CM | POA: Diagnosis not present

## 2018-07-08 DIAGNOSIS — I10 Essential (primary) hypertension: Secondary | ICD-10-CM | POA: Diagnosis not present

## 2018-07-08 DIAGNOSIS — Z79899 Other long term (current) drug therapy: Secondary | ICD-10-CM | POA: Insufficient documentation

## 2018-07-08 DIAGNOSIS — E119 Type 2 diabetes mellitus without complications: Secondary | ICD-10-CM | POA: Insufficient documentation

## 2018-07-08 DIAGNOSIS — R7989 Other specified abnormal findings of blood chemistry: Secondary | ICD-10-CM | POA: Diagnosis not present

## 2018-07-08 LAB — CBC WITH DIFFERENTIAL/PLATELET
Abs Immature Granulocytes: 0.04 10*3/uL (ref 0.00–0.07)
Basophils Absolute: 0.1 10*3/uL (ref 0.0–0.1)
Basophils Relative: 1 %
Eosinophils Absolute: 0.3 10*3/uL (ref 0.0–0.5)
Eosinophils Relative: 3 %
HCT: 37.9 % (ref 36.0–46.0)
Hemoglobin: 11.8 g/dL — ABNORMAL LOW (ref 12.0–15.0)
Immature Granulocytes: 1 %
LYMPHS ABS: 4 10*3/uL (ref 0.7–4.0)
Lymphocytes Relative: 44 %
MCH: 28.1 pg (ref 26.0–34.0)
MCHC: 31.1 g/dL (ref 30.0–36.0)
MCV: 90.2 fL (ref 80.0–100.0)
MONOS PCT: 6 %
Monocytes Absolute: 0.5 10*3/uL (ref 0.1–1.0)
Neutro Abs: 4 10*3/uL (ref 1.7–7.7)
Neutrophils Relative %: 45 %
Platelets: 380 10*3/uL (ref 150–400)
RBC: 4.2 MIL/uL (ref 3.87–5.11)
RDW: 14.9 % (ref 11.5–15.5)
WBC: 8.9 10*3/uL (ref 4.0–10.5)
nRBC: 0 % (ref 0.0–0.2)

## 2018-07-08 LAB — BASIC METABOLIC PANEL
Anion gap: 11 (ref 5–15)
BUN: 11 mg/dL (ref 6–20)
CO2: 24 mmol/L (ref 22–32)
Calcium: 8.8 mg/dL — ABNORMAL LOW (ref 8.9–10.3)
Chloride: 100 mmol/L (ref 98–111)
Creatinine, Ser: 0.76 mg/dL (ref 0.44–1.00)
GFR calc non Af Amer: >60 mL/min (ref >60)
Glucose, Bld: 146 mg/dL — ABNORMAL HIGH (ref 70–99)
Potassium: 3.9 mmol/L (ref 3.5–5.1)
Sodium: 135 mmol/L (ref 135–145)

## 2018-07-08 LAB — MAGNESIUM: Magnesium: 2.3 mg/dL (ref 1.7–2.4)

## 2018-07-08 LAB — CBG MONITORING, ED: Glucose-Capillary: 137 mg/dL — ABNORMAL HIGH (ref 70–99)

## 2018-07-08 LAB — PHOSPHORUS: PHOSPHORUS: 3 mg/dL (ref 2.5–4.6)

## 2018-07-08 MED ORDER — KETOROLAC TROMETHAMINE 30 MG/ML IJ SOLN
30.0000 mg | Freq: Once | INTRAMUSCULAR | Status: AC
  Administered 2018-07-08: 30 mg via INTRAVENOUS
  Filled 2018-07-08: qty 1

## 2018-07-08 MED ORDER — LORAZEPAM 2 MG/ML IJ SOLN
1.0000 mg | Freq: Once | INTRAMUSCULAR | Status: AC
  Administered 2018-07-08: 1 mg via INTRAVENOUS
  Filled 2018-07-08: qty 1

## 2018-07-08 MED ORDER — POLYETHYLENE GLYCOL 3350 17 G PO PACK: 17.0000 g | PACK | Freq: Every day | ORAL | Status: DC | PRN

## 2018-07-08 MED ORDER — PROCHLORPERAZINE EDISYLATE 10 MG/2ML IJ SOLN
10.0000 mg | Freq: Once | INTRAMUSCULAR | Status: AC
  Administered 2018-07-08: 10 mg via INTRAVENOUS
  Filled 2018-07-08: qty 2

## 2018-07-08 MED ORDER — PROMETHAZINE HCL 12.5 MG PO TABS
25.0000 mg | ORAL_TABLET | Freq: Four times a day (QID) | ORAL | Status: DC | PRN
  Administered 2018-07-10: 25 mg via ORAL
  Filled 2018-07-08: qty 2

## 2018-07-08 MED ORDER — LORAZEPAM 2 MG/ML IJ SOLN
INTRAMUSCULAR | Status: AC
  Filled 2018-07-08: qty 1

## 2018-07-08 MED ORDER — GABAPENTIN 300 MG PO CAPS
300.0000 mg | ORAL_CAPSULE | Freq: Once | ORAL | Status: AC
  Administered 2018-07-08: 300 mg via ORAL
  Filled 2018-07-08: qty 1

## 2018-07-08 MED ORDER — LEVETIRACETAM IN NACL 500 MG/100ML IV SOLN
500.0000 mg | Freq: Two times a day (BID) | INTRAVENOUS | Status: DC
  Administered 2018-07-09 – 2018-07-10 (×3): 500 mg via INTRAVENOUS
  Filled 2018-07-08 (×7): qty 100

## 2018-07-08 MED ORDER — CLONAZEPAM 0.5 MG PO TABS
2.0000 mg | ORAL_TABLET | Freq: Three times a day (TID) | ORAL | Status: DC
  Administered 2018-07-08 – 2018-07-10 (×5): 2 mg via ORAL
  Filled 2018-07-08 (×5): qty 4

## 2018-07-08 MED ORDER — VENLAFAXINE HCL ER 75 MG PO CP24
150.0000 mg | ORAL_CAPSULE | Freq: Every day | ORAL | Status: DC
  Administered 2018-07-08: 150 mg via ORAL
  Filled 2018-07-08: qty 4

## 2018-07-08 MED ORDER — PRENATAL MULTIVITAMIN CH
1.0000 | ORAL_TABLET | Freq: Every day | ORAL | Status: DC
  Administered 2018-07-08: 1 via ORAL
  Filled 2018-07-08 (×4): qty 1

## 2018-07-08 MED ORDER — VENLAFAXINE HCL ER 75 MG PO CP24
75.0000 mg | ORAL_CAPSULE | Freq: Every day | ORAL | Status: DC
  Administered 2018-07-08: 75 mg via ORAL
  Filled 2018-07-08: qty 2

## 2018-07-08 MED ORDER — INSULIN ASPART 100 UNIT/ML ~~LOC~~ SOLN
0.0000 [IU] | Freq: Three times a day (TID) | SUBCUTANEOUS | Status: DC
  Administered 2018-07-09 (×2): 2 [IU] via SUBCUTANEOUS
  Administered 2018-07-10: 3 [IU] via SUBCUTANEOUS

## 2018-07-08 MED ORDER — SODIUM CHLORIDE 0.9 % IV SOLN
Freq: Once | INTRAVENOUS | Status: AC
  Administered 2018-07-08: 1000 mL via INTRAVENOUS

## 2018-07-08 MED ORDER — IBUPROFEN 600 MG PO TABS
600.0000 mg | ORAL_TABLET | Freq: Four times a day (QID) | ORAL | Status: DC | PRN
  Administered 2018-07-09 – 2018-07-10 (×2): 600 mg via ORAL
  Filled 2018-07-08: qty 1
  Filled 2018-07-08: qty 2

## 2018-07-08 MED ORDER — IBUPROFEN 400 MG PO TABS: 400.0000 mg | ORAL_TABLET | Freq: Four times a day (QID) | ORAL | Status: DC | PRN

## 2018-07-08 MED ORDER — CLONAZEPAM 0.5 MG PO TABS
1.0000 mg | ORAL_TABLET | Freq: Once | ORAL | Status: AC
  Administered 2018-07-08: 1 mg via ORAL
  Filled 2018-07-08: qty 2

## 2018-07-08 MED ORDER — GABAPENTIN 300 MG PO CAPS
600.0000 mg | ORAL_CAPSULE | Freq: Three times a day (TID) | ORAL | Status: DC
  Administered 2018-07-08 – 2018-07-10 (×5): 600 mg via ORAL
  Filled 2018-07-08 (×5): qty 2

## 2018-07-08 MED ORDER — ACETAMINOPHEN 650 MG RE SUPP: 650.0000 mg | Freq: Four times a day (QID) | RECTAL | Status: DC | PRN

## 2018-07-08 MED ORDER — TRAZODONE HCL 50 MG PO TABS: 150.0000 mg | ORAL_TABLET | Freq: Every evening | ORAL | Status: DC | PRN

## 2018-07-08 MED ORDER — LEVETIRACETAM IN NACL 1000 MG/100ML IV SOLN
1000.0000 mg | Freq: Once | INTRAVENOUS | Status: AC
  Administered 2018-07-08: 1000 mg via INTRAVENOUS
  Filled 2018-07-08: qty 100

## 2018-07-08 MED ORDER — ACETAMINOPHEN 325 MG PO TABS
650.0000 mg | ORAL_TABLET | Freq: Four times a day (QID) | ORAL | Status: DC | PRN
  Administered 2018-07-08 – 2018-07-10 (×3): 650 mg via ORAL
  Filled 2018-07-08 (×3): qty 2

## 2018-07-08 MED ORDER — SODIUM CHLORIDE 0.9 % IV SOLN
INTRAVENOUS | Status: DC
  Administered 2018-07-08: 20:00:00 via INTRAVENOUS

## 2018-07-08 NOTE — ED Provider Notes (Addendum)
[facility]Y [facility]MENT Provider Note   CSN: [id number] Arrival date & time: 07/08/18  1258     History   Chief Complaint Chief Complaint  Patient presents with  . Seizures    HPI Sonya Gibson is a 33 y.o. female.  HPI  The patient is a 33 year old female, she is morbidly obese, she has a history of chronic headaches and in fact had recently been admitted to the hospital with headaches after she was [place] [facility] to [facility] where she had an MRI which was rather unremarkable.  She has followed up with the neurologist and is currently taking a combination of oxycodone and promethazine as needed for headaches and nausea.  She reports that the initial headache started chronically, she was diagnosed with migraines but after having lumbar surgery she was diagnosed with a persistent CSF leak with worsening headaches and eventually had a CSF shunt that was placed.  She reports that her headaches are present today but the reason for her visit is that she has had multiple repeat seizures today.  She is never been diagnosed with a seizure disorder.  Evidently there was a history of a possible seizure that occurred in the intensive care unit during her inpatient stay.  She was discharged 4 or 5 days ago.  The husband witnessed 3 separate seizures today, the first 2 were at home while she was lying on the bed.  She had tonic-clonic activity that he describes with a short postictal phase and some urinary incontinence however he states that within 2 to 3 minutes she was back to her normal self after both the first and second seizure.  Again she was witnessed to have some tonic-clonic like activity by nursing on arrival to the room.  There was no tongue biting or urinary incontinence and within about 5 minutes the patient seems to be back to her normal self answering my questions.  She reports that her headache is no different.  She has no other symptoms other than  that.  Of note the patient did have a miscarriage and was given Provera on January 10, the bleeding is stopped.  Past Medical History:  Diagnosis Date  . Anxiety   . Chronic back pain   . Depression   . Diabetes (HCC)   . Drug-seeking behavior   . Essential hypertension   . GERD (gastroesophageal reflux disease)   . Hypothyroidism   . Lumbar radiculopathy, right   . Migraine headache   . PTSD (post-traumatic stress disorder)     Patient Active Problem List   Diagnosis Date Noted  . Headache 07/02/2018  . Vaginal bleeding 07/02/2018  . CSF leak 12/20/2017  . Pressure injury of skin 12/20/2017  . Lumbar radiculopathy   . S/P lumbar laminectomy 12/05/2017  . Perineal numbness 11/30/2017  . Weakness of right lower extremity 11/17/2017  . Lower back pain 11/15/2017  . Hypothyroidism   . Anxiety   . Essential hypertension   . PTSD (post-traumatic stress disorder)   . Decreased sensation of leg   . GESTATIONAL DIABETES 04/26/2010    Past Surgical History:  Procedure Laterality Date  . ANTERIOR CRUCIATE LIGAMENT REPAIR Left   . BACK SURGERY  12/05/2017  . CESAREAN SECTION    . IR EPIDUROGRAPHY  11/17/2017  . KNEE SURGERY    . LUMBAR LAMINECTOMY/DECOMPRESSION MICRODISCECTOMY Right 12/05/2017   Procedure: Right L4-5 Microdiskectomy;  Surgeon: Tia AlertJones, David S, MD;  Location: [facility] OR;  Service: Neurosurgery;  Laterality: Right;  . LUMBAR LAMINECTOMY/DECOMPRESSION MICRODISCECTOMY N/A 12/20/2017   Procedure: Repair of CSF Leak;  Surgeon: Tia Alert, MD;  Location: [facility] OR;  Service: Neurosurgery;  Laterality: N/A;  . LUMBAR WOUND DEBRIDEMENT N/A 12/27/2017   Procedure: Re-exploration for CSF leak repair and placement of lumbar drain;  Surgeon: Tia Alert, MD;  Location: [facility] OR;  Service: Neurosurgery;  Laterality: N/A;  . middle finger reatachment    . PLACEMENT OF LUMBAR DRAIN N/A 12/27/2017   Procedure: PLACEMENT OF LUMBAR DRAIN;  Surgeon: Tia Alert, MD;  [facility];   Service: Neurosurgery;  Laterality: N/A;  . TONSILLECTOMY AND ADENOIDECTOMY  1996  . VENTRICULOPERITONEAL SHUNT N/A 01/08/2018   Procedure: Re-exploration and repair of previous Lumbar cerebro-spinal fluid leak and placement of Lumbar drain;  Surgeon: Tia Alert, MD;  Location: [facility] OR;  Service: Neurosurgery;  Laterality: N/A;     OB History    Gravida  1   Para      Term      Preterm      AB      Living        SAB      TAB      Ectopic      Multiple      Live Births               Home Medications    Prior to Admission medications   Medication Sig Start Date End Date Taking? Authorizing Provider  acetaminophen (TYLENOL) 325 MG tablet Take 650 mg by mouth every 6 (six) hours as needed.    [provider]  albuterol (VENTOLIN HFA) 108 (90 Base) MCG/ACT inhaler Inhale 2 puffs into the lungs every 6 (six) hours as needed for wheezing or shortness of breath.    [provider]  Butalbital-APAP-Caffeine 50-300-40 MG CAPS Take 1 capsule by mouth every 4 (four) hours as needed (headache).  12/19/17   [provider]  calcium carbonate (TUMS EX) 750 MG chewable tablet Chew 2 tablets by mouth as needed for heartburn.    [provider]  cyanocobalamin (,VITAMIN B-12,) 1000 MCG/ML injection Inject 1,000 mcg into the muscle See admin instructions. Every two weeks 02/16/18   [provider]  cyclobenzaprine (FLEXERIL) 10 MG tablet Take 1 tablet (10 mg total) by mouth 3 (three) times daily as needed for muscle spasms. 12/06/17   Osvaldo Shipper, MD  Melatonin 10 MG TABS Take 10 mg by mouth daily as needed (sleep).    [provider]  metFORMIN (GLUCOPHAGE) 500 MG tablet Take 1 tablet by mouth 2 (two) times daily with a meal.  09/25/17 09/20/18  [provider]  ondansetron (ZOFRAN ODT) 8 MG disintegrating tablet Take 1 tablet (8 mg total) by mouth every 8 (eight) hours as needed for up to 20 doses for nausea or vomiting.  11/21/17   Amin, Loura Halt, MD  oxyCODONE-acetaminophen (PERCOCET) 10-325 MG tablet Take 1 tablet by mouth every 6 (six) hours as needed for pain. 07/06/18   Anson Fret, MD  promethazine (PHENERGAN) 25 MG tablet Take 1 tablet (25 mg total) by mouth every 6 (six) hours as needed for nausea or vomiting. 07/06/18   Anson Fret, MD  traZODone (DESYREL) 150 MG tablet Take 150 mg by mouth at bedtime as needed for sleep.    [provider]  venlafaxine XR (EFFEXOR-XR) 150 MG 24 hr capsule Take 150 mg by mouth at bedtime.  06/06/17 06/14/18  [provider]  venlafaxine XR (EFFEXOR-XR) 75 MG 24 hr capsule Take 75 mg by mouth at bedtime.  12/12/17   [provider]  Vitamin D, Ergocalciferol, (DRISDOL) 1.25 MG (50000 UT) CAPS capsule Take 50,000 Units by mouth every 7 (seven) days.    [provider]    Family History Family History  Problem Relation Age of Onset  . Diabetes Mellitus II Mother   . Hypertension Mother   . Seizures Mother   . Other Mother        post stroke headaches  . Stroke Mother   . Hypertension Father   . Diabetes Mellitus II Father   . Hyperlipidemia Father   . Bipolar disorder Sister   . Asthma Son   . Autism Son   . Tourette syndrome Son   . Autism Daughter        level 3  . Other Daughter        tibia torsion & femoral aniversion    Social History Social History   Tobacco Use  . Smoking status: Former Smoker    Packs/day: 2.00    Years: 3.00    Pack years: 6.00    Types: Cigarettes    Last attempt to quit: 2006    Years since quitting: 14.0  . Smokeless tobacco: Never Used  Substance Use Topics  . Alcohol use: Yes    Comment: socially  . Drug use: No     Allergies   Cinnamon; Coconut oil; Lovenox [enoxaparin]; Nitrofurantoin; and Other   Review of Systems Review of Systems  All other systems reviewed and are negative.    Physical Exam Updated Vital Signs BP (!) 140/91 (BP Location: Right Arm)    Pulse (!) 125   Temp 98.4 F (36.9 C) (Oral)   Resp 18   Ht 1.549 m (5\' 1" )   Wt (!) 147 kg   LMP 06/29/2018   SpO2 95%   BMI 61.22 kg/m   Physical Exam Vitals signs and nursing note reviewed.  Constitutional:      General: She is not in acute distress.    Appearance: She is well-developed.  HENT:     Head: Normocephalic and atraumatic.     Mouth/Throat:     Pharynx: No oropharyngeal exudate.  Eyes:     General: No scleral icterus.       Right eye: No discharge.        Left eye: No discharge.     Conjunctiva/sclera: Conjunctivae normal.     Pupils: Pupils are equal, round, and reactive to light.  Neck:     Musculoskeletal: Normal range of motion and neck supple.     Thyroid: No thyromegaly.     Vascular: No JVD.  Cardiovascular:     Rate and Rhythm: Regular rhythm. Tachycardia present.     Heart sounds: Normal heart sounds. No murmur. No friction rub. No gallop.   Pulmonary:     Effort: Pulmonary effort is normal. No respiratory distress.     Breath sounds: Normal breath sounds. No wheezing or rales.  Abdominal:     General: Bowel sounds are normal. There is no distension.     Palpations: Abdomen is soft. There is no mass.     Tenderness: There is no abdominal tenderness.  Musculoskeletal: Normal range of motion.        General: No tenderness.  Lymphadenopathy:     Cervical: No cervical adenopathy.  Skin:    General: Skin is warm and  dry.     Findings: No erythema or rash.  Neurological:     Mental Status: She is alert.     Coordination: Coordination normal.     Comments: The patient has normal strength in all 4 extremities, there is no facial droop, her speech is clear, she has normal coordination.  Her memory is intact except she cannot remember any of the seizure like activity that she has had today  Psychiatric:        Behavior: Behavior normal.      ED Treatments / Results  Labs (all labs ordered are listed, but only abnormal results are  displayed) Labs Reviewed - No data to display  EKG EKG Interpretation  Date/Time:  Sunday July 08 2018 13:29:54 EST Ventricular Rate:  122 PR Interval:    QRS Duration: 94 QT Interval:  318 QTC Calculation: 453 R Axis:   62 Text Interpretation:  Sinus tachycardia Low voltage, precordial leads Baseline wander in lead(s) II III ECG OTHERWISE WITHIN NORMAL LIMITS Since last tracing rate faster Confirmed by Eber Hong ([id number]) on 07/08/2018 2:28:41 PM   Radiology No results found.  Procedures .Critical Care Performed by: Eber Hong, MD Authorized by: Eber Hong, MD   Critical care provider statement:    Critical care time (minutes):  35   Critical care time was exclusive of:  Separately billable procedures and treating other patients and teaching time   Critical care was necessary to treat or prevent imminent or life-threatening deterioration of the following conditions:  CNS failure or compromise   Critical care was time spent personally by me on the following activities:  Blood draw for specimens, development of treatment plan with patient or surrogate, discussions with consultants, evaluation of patient's response to treatment, examination of patient, obtaining history from patient or surrogate, ordering and performing treatments and interventions, ordering and review of laboratory studies, ordering and review of radiographic studies, pulse oximetry, re-evaluation of patient's condition and review of old charts   (including critical care time)  Medications Ordered in ED Medications  LORazepam (ATIVAN) 2 MG/ML injection (has no administration in time range)     Initial Impression / Assessment and Plan / ED Course  I have reviewed the triage vital signs and the nursing notes.  Pertinent labs & imaging results that were available during my care of the patient were reviewed by me and considered in my medical decision making (see chart for details).  Clinical Course as  of Jul 08 1509  Sun Jul 08, 2018  1413 No further seizures, Keppra ordered at the request of neurology, they also suggest restarting benzos and gabapentin and admitting to the hospital for observation given multiple seizures today.  Will page hospitalist for admission   [BM]  1428 I discussed care with the patient  Reports that she was told to stop the benzo and gabapentin on d/c from hospital and has not had in 4-5 days.  Will load with Keppra, and give Klonopin and Gabapentin - Ativan as well as faster onset   [BM]    Clinical Course User Index [BM] Eber Hong, MD    The patient has no formal history of diagnosed seizure and and does not take an antiepileptic.  She does report that her Klonopin was stopped 5 days ago as well as her gabapentin, it seems like that is a long time before you would have a withdrawal seizure but will discuss with neurology.  She is tachycardic to 120.  She does not have  any other symptoms other than her headache which seems to be baseline for her.  I have reviewed the MRI while she was inpatient, I do not think she needs any acute imaging at this time.  Due to multiple seizures and ongoing tachycardia as well as persistsent headache a nd the likelihood that benzo withdrawal contributed to seizure, the pt will be admitted.  Hospitalist paged 3:13 PM   Final Clinical Impressions(s) / ED Diagnoses   Final diagnoses:  Seizure [facility])       Eber Hong, MD 07/08/18 1608    Eber Hong, MD 07/08/18 2103

## 2018-07-08 NOTE — H&P (Addendum)
History and Physical    Sonya Gibson YOM:[id number] DOB: Jan 15, 1986 DOA: 07/08/2018  PCP: Patient, No Pcp Per   Patient coming from: Home  I have personally briefly reviewed patient's old medical records in [facility]  Chief Complaint: Seizures  HPI: Sonya Gibson is a 33 y.o. female with medical history significant for CSF leak, migraines, PTSD and anxiety, HTN, DM, hypothyroidism, who was brought to [facility], after 2 witnessed seizure by husband at home. Patient had a third 1 here in the ED. seizures are generalized, lasting 10 to 15 seconds, with a short postictal phase lasting 2 to 5 minutes.   Patient was recently  discharged from [facility] after admission for intractable headaches/migraine.  On discharge to medications gabapentin and clonazepam were discontinued.  Patient has been on 2 mg clonazepam 3-4 times daily for PTSD and anxiety for the past ~15 years.  No personal or family history of seizures.  No other symptoms apart from headaches.  She drinks alcohol only on social occasions.  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 Recent hospital admission 1/13- 1/14-for intractable headaches.  Patient headaches was controlled with Toradol Benadryl Imitrex and Decadron x1, and discharged home to continue home Fioricet.  Patient subsequently followed up with neurologist Dr. Lucia Gaskins.  ED Course: Tachycardic to 125, CBC BMP unremarkable.  Sinus tachycardia.  Third seizure witnessed in the ED.  Neurology was consulted, Dr. Otelia Limes recommended admission overnight , resume home clonazepam and gabapentin, IV Keppra.  Brain imaging not ordered as patient had recently had CT ( outside facility) and brain MRI which were negative for acute abnormality.  Review of Systems: As per HPI all other systems reviewed and negative  Past Medical History:    Diagnosis Date  . Anxiety   . Chronic back pain   . Depression   . Diabetes (HCC)   . Drug-seeking behavior   . Essential hypertension   . GERD (gastroesophageal reflux disease)   . Hypothyroidism   . Lumbar radiculopathy, right   . Migraine headache   . PTSD (post-traumatic stress disorder)     Past Surgical History:  Procedure Laterality Date  . ANTERIOR CRUCIATE LIGAMENT REPAIR Left   . BACK SURGERY  12/05/2017  . CESAREAN SECTION    . IR EPIDUROGRAPHY  11/17/2017  . KNEE SURGERY    . LUMBAR LAMINECTOMY/DECOMPRESSION MICRODISCECTOMY Right 12/05/2017   Procedure: Right L4-5 Microdiskectomy;  Surgeon: Tia Alert, MD;  Location: [facility] OR;  Service: Neurosurgery;  Laterality: Right;  . LUMBAR LAMINECTOMY/DECOMPRESSION MICRODISCECTOMY N/A 12/20/2017   Procedure: Repair of CSF Leak;  Surgeon: Tia Alert, MD;  Location: [facility] OR;  Service: Neurosurgery;  Laterality: N/A;  . LUMBAR WOUND DEBRIDEMENT N/A 12/27/2017   Procedure: Re-exploration for CSF leak repair and placement of lumbar drain;  Surgeon: Tia Alert, MD;  Location: [facility] OR;  Service: Neurosurgery;  Laterality: N/A;  . middle finger reatachment    . PLACEMENT OF LUMBAR DRAIN N/A 12/27/2017   Procedure: PLACEMENT OF LUMBAR DRAIN;  Surgeon: Tia Alert, MD;  Location: [facility] OR;  Service: Neurosurgery;  Laterality: N/A;  . TONSILLECTOMY AND ADENOIDECTOMY  1996  . VENTRICULOPERITONEAL SHUNT N/A 01/08/2018   Procedure: Re-exploration and repair of previous Lumbar cerebro-spinal fluid leak and placement of Lumbar drain;  Surgeon: Tia Alert, MD;  Location: [facility] OR;  Service: Neurosurgery;  Laterality:  N/A;     reports that she quit smoking about 14 years ago. Her smoking use included cigarettes. She has a 6.00 pack-year smoking history. She has never used smokeless tobacco. She reports current alcohol use. She reports that she does not use drugs.  Allergies  Allergen Reactions  . Cinnamon Anaphylaxis    (NO "REAL"/TREE BARK  CINNAMON)  . Coconut Oil Anaphylaxis    Can eat "fake" coconut  . Lovenox [Enoxaparin] Other (See Comments)    BROKE DOWN THE SKIN AT INJECTION SITE AND CAUSED A WOUND  . Nitrofurantoin Hives  . Other     "Headache cocktail"   Blister to abd    Family History  Problem Relation Age of Onset  . Diabetes Mellitus II Mother   . Hypertension Mother   . Seizures Mother   . Other Mother        post stroke headaches  . Stroke Mother   . Hypertension Father   . Diabetes Mellitus II Father   . Hyperlipidemia Father   . Bipolar disorder Sister   . Asthma Son   . Autism Son   . Tourette syndrome Son   . Autism Daughter        level 3  . Other Daughter        tibia torsion & femoral aniversion     Prior to Admission medications   Medication Sig Start Date End Date Taking? Authorizing Provider  Butalbital-APAP-Caffeine 50-300-40 MG CAPS Take 1 capsule by mouth every 4 (four) hours as needed (headache).  12/19/17  Yes [provider]  calcium carbonate (TUMS EX) 750 MG chewable tablet Chew 2 tablets by mouth as needed for heartburn.   Yes [provider]  cyanocobalamin (,VITAMIN B-12,) 1000 MCG/ML injection Inject 1,000 mcg into the muscle See admin instructions. Every two weeks 02/16/18  Yes [provider]  cyclobenzaprine (FLEXERIL) 10 MG tablet Take 1 tablet (10 mg total) by mouth 3 (three) times daily as needed for muscle spasms. 12/06/17  Yes Osvaldo ShipperKrishnan, Gokul, MD  diphenhydrAMINE (BENADRYL) 50 MG capsule Take 50 mg by mouth every 6 (six) hours as needed.   Yes [provider]  ibuprofen (ADVIL,MOTRIN) 200 MG tablet Take 400 mg by mouth every 6 (six) hours as needed.   Yes [provider]  metFORMIN (GLUCOPHAGE) 500 MG tablet Take 1 tablet by mouth 2 (two) times daily with a meal.  09/25/17 09/20/18 Yes [provider]  oxyCODONE-acetaminophen (PERCOCET) 10-325 MG tablet Take 1 tablet by mouth every 6 (six) hours as needed for pain. 07/06/18   Yes Anson FretAhern, Antonia B, MD  Prenatal Vit-Fe Fumarate-FA (PRENATAL MULTIVITAMIN) TABS tablet Take 2 tablets by mouth Nightly.   Yes [provider]  promethazine (PHENERGAN) 25 MG tablet Take 1 tablet (25 mg total) by mouth every 6 (six) hours as needed for nausea or vomiting. 07/06/18  Yes Anson FretAhern, Antonia B, MD  traZODone (DESYREL) 150 MG tablet Take 150 mg by mouth at bedtime as needed for sleep.   Yes [provider]  venlafaxine XR (EFFEXOR-XR) 150 MG 24 hr capsule Take 150 mg by mouth at bedtime.  06/06/17 07/08/18 Yes [provider]  venlafaxine XR (EFFEXOR-XR) 75 MG 24 hr capsule Take 75 mg by mouth at bedtime.  12/12/17  Yes [provider]  Vitamin D, Ergocalciferol, (DRISDOL) 1.25 MG (50000 UT) CAPS capsule Take 50,000 Units by mouth every 7 (seven) days.   Yes [provider]  acetaminophen (TYLENOL) 325 MG tablet Take  650 mg by mouth every 6 (six) hours as needed.    [provider]  albuterol (VENTOLIN HFA) 108 (90 Base) MCG/ACT inhaler Inhale 2 puffs into the lungs every 6 (six) hours as needed for wheezing or shortness of breath.    [provider]  Melatonin 10 MG TABS Take 10 mg by mouth daily as needed (sleep).    [provider]    Physical Exam: Vitals:   07/08/18 1700 07/08/18 1730 07/08/18 1800 07/08/18 1830  BP: 114/71 115/83 110/77 122/85  Pulse: (!) 117 (!) 115 (!) 120 (!) 114  Resp: 12 12 16 12   Temp:      TempSrc:      SpO2: 95%   94%  Weight:      Height:        Constitutional: deeply asleep, arousable but drowsy s/p Ativan gabapentin and clonazepam, arousable Vitals:   07/08/18 1700 07/08/18 1730 07/08/18 1800 07/08/18 1830  BP: 114/71 115/83 110/77 122/85  Pulse: (!) 117 (!) 115 (!) 120 (!) 114  Resp: 12 12 16 12   Temp:      TempSrc:      SpO2: 95%   94%  Weight:      Height:       Eyes: PERRL, lids and conjunctivae normal ENMT: Mucous membranes are moist. Posterior pharynx clear of  any exudate or lesions.  Neck: normal, supple, no masses, no thyromegaly Respiratory: clear to auscultation bilaterally, no wheezing, no crackles. Normal respiratory effort.  Cardiovascular: Regular rate and rhythm, no murmurs / rubs / gallops. No extremity edema. 2+ pedal pulses. No carotid bruits.  Abdomen: no tenderness, no masses palpated. No hepatosplenomegaly. Bowel sounds positive.  Musculoskeletal: no clubbing / cyanosis. No joint deformity upper and lower extremities. Good ROM, no contractures. Normal muscle tone.  Skin: no rashes, lesions, ulcers. No induration Neurologic: Exam slightly limited by patient's drowsiness CN 2-12 grossly intact. Sensation intact, DTR normal. Strength 5/5 in all 4.  Psychiatric: Normal judgment and insight. Alert and oriented x 3. Normal mood.   Labs on Admission: I have personally reviewed following labs and imaging studies  CBC: Recent Labs  Lab 07/02/18 2148 07/08/18 1429  WBC 9.1 8.9  NEUTROABS  --  4.0  HGB 11.8* 11.8*  HCT 35.9* 37.9  MCV 88.9 90.2  PLT 377 380   Basic Metabolic Panel: Recent Labs  Lab 07/02/18 2148 07/08/18 1429  NA 132* 135  K 3.8 3.9  CL 100 100  CO2 24 24  GLUCOSE 164* 146*  BUN 6 11  CREATININE 0.76 0.76  CALCIUM 8.5* 8.8*   Liver Function Tests: Recent Labs  Lab 07/02/18 2148  AST 37  ALT 38  ALKPHOS 65  BILITOT 0.5  PROT 7.0  ALBUMIN 3.5    Radiological Exams on Admission: No results found.  EKG: Independently reviewed. Sinus rhythm.  EKG unchanged from prior.  Assessment/Plan Active Problems:   Seizures (HCC)  Seizures-new onset.  Differentials-withdrawal from benzodiazepine and discontinuation of gabapentin vs related to prior intractable headaches or complicated migraine.  Benzodiazepine withdrawal symptoms can you call up to 3 weeks after continuing use. No significant alcohol history.  MRI brain-no acute abnormality.  IV Keppra 1000mg  given -Continue IV Keppra 500mg  Q12h -Resume home  Clonazepam at previous home dose 2 mg every 8 hourly -Resume home gabapentin 600 mg 3 times daily -Neurology consulted-order placed - NPO for now -Check magnesium, phosphorus  Tachycardia- ?  Related to benzodiazepine withdrawal.  Unremarkable CBC CMP.  No symptoms to suggest infection. - N/s 100cc/hr x 15hrs  CSF leak, migraines-surgery for lumbar disc herniation 11/2017 complicated by CSF leak requiring placement of lumbar drain, requiring prolonged hospital stay. 12/2017. -Follow-up with neurology, and neurosurgery as outpatient -PRN ibuprofen, Phenergan prn  PTSD, anxiety-patient has been on clonazepam 2 mg every 6-8 hourly for the past 3645yrs.  -Continue home venlafaxine, Clonazepam  Hypothyroidism-TSH 1.6 -Continue home Synthroid  DM-glucose 146 - SSI - NPO, hold metformin  HTN-stable -Not on home antihypertensives  DVT prophylaxis: SCDs ( Allergy to enoxaparin, skin breakdown). Code Status: Full Family Communication: Spouse at bedside Disposition Plan: Per rounding team Consults called: neurology Admission status: Obs, med surg   Onnie BoerEjiroghene E Stefhanie Kachmar MD [facility]  07/08/2018, 7:44 PM

## 2018-07-08 NOTE — ED Notes (Signed)
Pt's husband walked out of pt's room stating, "she's seizing again". RN went into room and pt was shaking all over, lasted approximately 10 seconds. Dr. Hyacinth Meeker was called to bedside by another RN. Husband reports no hx of seizures except for 2 that happened PTA to ED today. Pt was placed on monitor. Pt's HR 142, O2 sat 97% on RA. Approximately 10-15 seconds after pt's shaking stopped, pt starting saying, "I'm sorry, I'm sorry. I don't know what happened".

## 2018-07-08 NOTE — ED Triage Notes (Signed)
Pt reports has had seizures x2 since noon today. Pt denies seizure history. Pt reports was referred by her neurologist and states is being treated for a spinal fluid leak and "sagging cerebellum". Pt alert and oriented. nad noted.

## 2018-07-09 ENCOUNTER — Telehealth: Payer: Self-pay | Admitting: Neurology

## 2018-07-09 ENCOUNTER — Observation Stay (HOSPITAL_COMMUNITY): Payer: BLUE CROSS/BLUE SHIELD

## 2018-07-09 ENCOUNTER — Observation Stay (HOSPITAL_BASED_OUTPATIENT_CLINIC_OR_DEPARTMENT_OTHER): Payer: BLUE CROSS/BLUE SHIELD

## 2018-07-09 ENCOUNTER — Observation Stay (HOSPITAL_COMMUNITY)
Admit: 2018-07-09 | Discharge: 2018-07-09 | Disposition: A | Discharge status: Home / Self Care | Payer: BLUE CROSS/BLUE SHIELD | Attending: Internal Medicine | Admitting: Internal Medicine

## 2018-07-09 DIAGNOSIS — R51 Headache with orthostatic component, not elsewhere classified: Secondary | ICD-10-CM | POA: Insufficient documentation

## 2018-07-09 DIAGNOSIS — G9782 Other postprocedural complications and disorders of nervous system: Secondary | ICD-10-CM | POA: Insufficient documentation

## 2018-07-09 DIAGNOSIS — R7989 Other specified abnormal findings of blood chemistry: Secondary | ICD-10-CM | POA: Diagnosis not present

## 2018-07-09 DIAGNOSIS — F4310 Post-traumatic stress disorder, unspecified: Secondary | ICD-10-CM | POA: Diagnosis not present

## 2018-07-09 DIAGNOSIS — Z6841 Body Mass Index (BMI) 40.0 and over, adult: Secondary | ICD-10-CM

## 2018-07-09 DIAGNOSIS — R569 Unspecified convulsions: Secondary | ICD-10-CM | POA: Diagnosis not present

## 2018-07-09 DIAGNOSIS — I370 Nonrheumatic pulmonary valve stenosis: Secondary | ICD-10-CM | POA: Diagnosis not present

## 2018-07-09 LAB — BASIC METABOLIC PANEL
Anion gap: 10 (ref 5–15)
BUN: 10 mg/dL (ref 6–20)
CALCIUM: 8.7 mg/dL — AB (ref 8.9–10.3)
CO2: 23 mmol/L (ref 22–32)
Chloride: 104 mmol/L (ref 98–111)
Creatinine, Ser: 0.84 mg/dL (ref 0.44–1.00)
GFR calc Af Amer: >60 mL/min (ref >60)
GFR calc non Af Amer: >60 mL/min (ref >60)
Glucose, Bld: 187 mg/dL — ABNORMAL HIGH (ref 70–99)
Potassium: 4.1 mmol/L (ref 3.5–5.1)
SODIUM: 137 mmol/L (ref 135–145)

## 2018-07-09 LAB — HEMOGLOBIN A1C
Hgb A1c MFr Bld: 6.5 % — ABNORMAL HIGH (ref 4.8–5.6)
Mean Plasma Glucose: 139.85 mg/dL

## 2018-07-09 LAB — ECHOCARDIOGRAM COMPLETE
Height: 61 in
Weight: 5358.06 oz

## 2018-07-09 LAB — GLUCOSE, CAPILLARY
Glucose-Capillary: 170 mg/dL — ABNORMAL HIGH (ref 70–99)
Glucose-Capillary: 219 mg/dL — ABNORMAL HIGH (ref 70–99)

## 2018-07-09 LAB — CK: Total CK: 71 U/L (ref 38–234)

## 2018-07-09 LAB — CBG MONITORING, ED: GLUCOSE-CAPILLARY: 106 mg/dL — AB (ref 70–99)

## 2018-07-09 LAB — D-DIMER, QUANTITATIVE: D-Dimer, Quant: 0.51 ug/mL-FEU — ABNORMAL HIGH (ref 0.00–0.50)

## 2018-07-09 MED ORDER — VENLAFAXINE HCL ER 75 MG PO CP24
75.0000 mg | ORAL_CAPSULE | Freq: Every day | ORAL | Status: DC
  Administered 2018-07-09: 75 mg via ORAL
  Filled 2018-07-09: qty 1

## 2018-07-09 MED ORDER — OXYCODONE-ACETAMINOPHEN 5-325 MG PO TABS
1.0000 | ORAL_TABLET | Freq: Four times a day (QID) | ORAL | Status: DC | PRN
  Administered 2018-07-09 (×2): 1 via ORAL
  Filled 2018-07-09 (×2): qty 1

## 2018-07-09 MED ORDER — PROCHLORPERAZINE EDISYLATE 10 MG/2ML IJ SOLN
10.0000 mg | Freq: Once | INTRAMUSCULAR | Status: AC
  Administered 2018-07-09: 10 mg via INTRAVENOUS
  Filled 2018-07-09: qty 2

## 2018-07-09 MED ORDER — KETOROLAC TROMETHAMINE 30 MG/ML IJ SOLN
30.0000 mg | Freq: Once | INTRAMUSCULAR | Status: AC
  Administered 2018-07-09: 30 mg via INTRAVENOUS
  Filled 2018-07-09: qty 1

## 2018-07-09 MED ORDER — POTASSIUM CHLORIDE IN NACL 20-0.9 MEQ/L-% IV SOLN
INTRAVENOUS | Status: DC
  Administered 2018-07-09 – 2018-07-10 (×3): via INTRAVENOUS
  Filled 2018-07-09: qty 1000

## 2018-07-09 MED ORDER — VENLAFAXINE HCL ER 75 MG PO CP24
150.0000 mg | ORAL_CAPSULE | Freq: Every day | ORAL | Status: DC
  Administered 2018-07-09: 150 mg via ORAL
  Filled 2018-07-09: qty 2

## 2018-07-09 MED ORDER — IOPAMIDOL (ISOVUE-370) INJECTION 76%
100.0000 mL | Freq: Once | INTRAVENOUS | Status: AC | PRN
  Administered 2018-07-09: 100 mL via INTRAVENOUS

## 2018-07-09 MED ORDER — PANTOPRAZOLE SODIUM 40 MG PO TBEC
40.0000 mg | DELAYED_RELEASE_TABLET | Freq: Every day | ORAL | Status: DC
  Administered 2018-07-09 – 2018-07-10 (×2): 40 mg via ORAL
  Filled 2018-07-09 (×2): qty 1

## 2018-07-09 MED ORDER — ONDANSETRON HCL 4 MG/2ML IJ SOLN
4.0000 mg | Freq: Four times a day (QID) | INTRAMUSCULAR | Status: DC
  Administered 2018-07-09 – 2018-07-10 (×5): 4 mg via INTRAVENOUS
  Filled 2018-07-09 (×5): qty 2

## 2018-07-09 MED ORDER — DIPHENHYDRAMINE HCL 50 MG/ML IJ SOLN
25.0000 mg | Freq: Once | INTRAMUSCULAR | Status: AC
  Administered 2018-07-09: 25 mg via INTRAVENOUS
  Filled 2018-07-09: qty 1

## 2018-07-09 MED ORDER — OXYCODONE HCL 5 MG PO TABS
5.0000 mg | ORAL_TABLET | Freq: Four times a day (QID) | ORAL | Status: DC | PRN
  Administered 2018-07-09 – 2018-07-10 (×4): 5 mg via ORAL
  Filled 2018-07-09 (×4): qty 1

## 2018-07-09 NOTE — Telephone Encounter (Signed)
Patient's husband reported 2 seizures on Sunday AM, each lasting 30 sec, shivering,open eyes, unresponsive, urinary incontinence and post spell confusion. Were sent to [facility] ED.

## 2018-07-09 NOTE — Telephone Encounter (Signed)
Sonya Gibson, my note is done. The consult request needs to be mailed with the MRI CD as they review MRI images before scheduling patient and will not schedule if they do not have the CD in hand (she can;t just bring it). So we will need to mail the CD maybe with the consult and chart information. Thanks.

## 2018-07-09 NOTE — Progress Notes (Signed)
*  PRELIMINARY RESULTS* Echocardiogram 2D Echocardiogram has been performed.  Sonya Gibson 07/09/2018, 3:20 PM

## 2018-07-09 NOTE — Progress Notes (Signed)
PROGRESS NOTE  Mel AlmondHolly Woodrow WUJ:[id number] DOB: 09/04/1985 DOA: 07/08/2018 PCP: Patient, No Pcp Per  Brief History:  33 year old female with a history of migraine headaches, hypertension, PTSD, diabetes mellitus presenting with 2 seizure episodes at home on the evening of 07/08/2018.  Apparently, the patient was lying in bed watching television when her husband witnessed a tonic-clonic type activity lasting approximately 30 seconds.  The patient had incontinence of her urine.  She had a short postictal period after which she was able to converse without difficulty.  Subsequently, the patient had another seizure 30 minutes later lasting 1 minute.  As result, the patient presented for further evaluation.  She had another tonic-clonic type activity in the emergency department.  The patient was recently mated to the hospital from 07/02/2018 through 07/03/2018 during which she was treated for an intractable headache.  The patient was told to stop her Klonopin and gabapentin at that time.  She was discharged with Fioricet.  Although review of PMP Aware shows that the patient has not had a prescription for clonazepam filled since 12/12/2017. 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   She was admitted to the hospital from 12/20/2017 through 01/16/2018  for pseudomeningocele and taken to the operating room multiple times for lumbar reexploration with repair of CSF leak and placement of lumbar drain.  She had a long and complicated hospital course.  The lumbar drain remained in place for 8 days and a CT myelogram suggested no evidence of leak.  The lumbar drain was removed.  She then once again started to leak spinal fluid from her wound.  She was taken back to the operating room for exploration and placement of lumboperitoneal shunt.  She was found to have had a tear through her suture line.  The dura  was resutured, and a lumbar drain was placed.  It was decided not to place a lumboperitoneal shunt at that time.  In addition, the patient had a recent admission from 07/02/2018 through 07/03/2018  Assessment/Plan: New onset seizure -EEG -UA -Urine drug screen -07/02/2018 MR brain--negative for acute findings -07/02/2018 MRI of cervical spine negative for acute findings, mild DJD -Continue Keppra -Gabapentin restarted -Discontinue tramadol  Sinus tachycardia -Check d-dimer -07/03/2018 TSH 1.617 -Urine drug screen -Place on telemetry -Echocardiogram  Recurrent nausea and vomiting -Apparently this is been chronic for the patient since her lumbar surgery -Start around-the-clock Zofran -Start PPI -IVF  Diabetes mellitus type 2 -Hemoglobin A1c -NovoLog sliding scale -Holding metformin  PTSD/anxiety -Continue venlafaxine -The [place] Substance Reporting System has been queried for this patient--patient has not had a prescription for clonazepam filled since 12/12/2017 -Prescription for Percocet filled 07/06/2018 10/325, #28 prescribed Dr. Lucia GaskinsAhern; no prior Percocet or oxycodone prescription since 03/13/2018    Disposition Plan:   Home 1/21 if stable Family Communication:  No Family at bedside  Consultants:  neurology  Code Status:  FULL   DVT Prophylaxis:  SCDs   Procedures: As Listed in Progress Note Above  Antibiotics: None       Subjective: Patient complains of a headache.  She denies any fevers, chills, pain, shortness breath, diarrhea, abdominal pain, dysuria, hematuria.  She has chronic nausea and vomiting.  Objective: Vitals:   07/09/18 0500 07/09/18 0530 07/09/18 0600 07/09/18 0610  BP: (!) 127/91 117/64 (!) 140/99 127/80  Pulse: (!) 125 (!) 110 Marland Kitchen(!)  115 (!) 113  Resp: (!) 22 18 19 18   Temp:      TempSrc:      SpO2: 94% 98% 96% 97%  Weight:      Height:        Intake/Output Summary (Last 24 hours) at 07/09/2018 0807 Last data filed  at 07/09/2018 0546 Gross per 24 hour  Intake 191.88 ml  Output -  Net 191.88 ml   Weight change:  Exam:   General:  Pt is alert, follows commands appropriately, not in acute distress  HEENT: No icterus, No thrush, No neck mass, [place]/AT  Cardiovascular: RRR, S1/S2, no rubs, no gallops  Respiratory: CTA bilaterally, no wheezing, no crackles, no rhonchi  Abdomen: Soft/+BS, non tender, non distended, no guarding  Extremities: No edema, No lymphangitis, No petechiae, No rashes, no synovitis   Data Reviewed: I have personally reviewed following labs and imaging studies Basic Metabolic Panel: Recent Labs  Lab 07/02/18 2148 07/08/18 1429 07/08/18 1438  NA 132* 135  --   K 3.8 3.9  --   CL 100 100  --   CO2 24 24  --   GLUCOSE 164* 146*  --   BUN 6 11  --   CREATININE 0.76 0.76  --   CALCIUM 8.5* 8.8*  --   MG  --   --  2.3  PHOS  --   --  3.0   Liver Function Tests: Recent Labs  Lab 07/02/18 2148  AST 37  ALT 38  ALKPHOS 65  BILITOT 0.5  PROT 7.0  ALBUMIN 3.5   No results for input(s): LIPASE, AMYLASE in the last 168 hours. No results for input(s): AMMONIA in the last 168 hours. Coagulation Profile: No results for input(s): INR, PROTIME in the last 168 hours. CBC: Recent Labs  Lab 07/02/18 2148 07/08/18 1429  WBC 9.1 8.9  NEUTROABS  --  4.0  HGB 11.8* 11.8*  HCT 35.9* 37.9  MCV 88.9 90.2  PLT 377 380   Cardiac Enzymes: No results for input(s): CKTOTAL, CKMB, CKMBINDEX, TROPONINI in the last 168 hours. BNP: Invalid input(s): POCBNP CBG: Recent Labs  Lab 07/08/18 2210  GLUCAP 137*   HbA1C: No results for input(s): HGBA1C in the last 72 hours. Urine analysis:    Component Value Date/Time   COLORURINE YELLOW 06/14/2018 0907   APPEARANCEUR CLEAR 06/14/2018 0907   LABSPEC 1.017 06/14/2018 0907   PHURINE 5.0 06/14/2018 0907   GLUCOSEU NEGATIVE 06/14/2018 0907   HGBUR NEGATIVE 06/14/2018 0907   BILIRUBINUR NEGATIVE 06/14/2018 0907   KETONESUR  NEGATIVE 06/14/2018 0907   PROTEINUR NEGATIVE 06/14/2018 0907   UROBILINOGEN 1.0 05/04/2015 0349   NITRITE NEGATIVE 06/14/2018 0907   LEUKOCYTESUR NEGATIVE 06/14/2018 0907   Sepsis Labs: @LABRCNTIP (procalcitonin:4,lacticidven:4) )No results found for this or any previous visit (from the past 240 hour(s)).   Scheduled Meds: . clonazePAM  2 mg Oral TID  . gabapentin  600 mg Oral TID  . insulin aspart  0-9 Units Subcutaneous TID WC  . prenatal multivitamin  1 tablet Oral QHS  . venlafaxine XR  150 mg Oral QHS  . venlafaxine XR  75 mg Oral QHS   Continuous Infusions: . sodium chloride 100 mL/hr at 07/09/18 0421  . levETIRAcetam Stopped (07/09/18 0212)    Procedures/Studies: Ct Head Wo Contrast  Result Date: 07/09/2018 CLINICAL DATA:  New seizures.  Acute vision disturbance EXAM: CT HEAD WITHOUT CONTRAST TECHNIQUE: Contiguous axial images were obtained from the base of the skull through the  vertex without intravenous contrast. COMPARISON:  Brain MRI 07/02/2018 FINDINGS: Brain: No evidence of infarction, hemorrhage, hydrocephalus, extra-axial collection or mass lesion/mass effect. Vascular: No hyperdense vessel or unexpected calcification. Skull: Normal. Negative for fracture or focal lesion. Sinuses/Orbits: Negative IMPRESSION: Negative head CT. Electronically Signed   By: Marnee Spring M.D.   On: 07/09/2018 06:36   Mr Brain Wo Contrast  Result Date: 07/03/2018 CLINICAL DATA:  33 y/o F; headache. History of migraine and spinal fluid leak in the past. EXAM: MRI HEAD WITHOUT CONTRAST MRI CERVICAL SPINE WITHOUT CONTRAST TECHNIQUE: Multiplanar, multiecho pulse sequences of the brain and surrounding structures, and cervical spine, to include the craniocervical junction and cervicothoracic junction, were obtained without intravenous contrast. COMPARISON:  03/28/2018 MRI head. FINDINGS: MRI HEAD FINDINGS Brain: No acute infarction, hemorrhage, hydrocephalus, extra-axial collection or mass lesion.  No structural or signal abnormality of the brain. Vascular: Normal flow voids. Skull and upper cervical spine: Normal marrow signal. Sinuses/Orbits: Negative. Other: None. MRI CERVICAL SPINE FINDINGS Alignment: Straightening of cervical lordosis without listhesis. Vertebrae: No fracture, evidence of discitis, or bone lesion. 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 Cord: Normal signal and morphology. Posterior Fossa, vertebral arteries, paraspinal tissues: Negative. Disc levels: Tiny C3-4 central protrusion with ventral thecal sac effacement. Mild loss of intervertebral disc space height the C4-C6 levels. Otherwise no significant disc displacement, foraminal stenosis, or canal stenosis. IMPRESSION: MRI head: Stable normal MRI of the head.  No acute process identified. MRI cervical spine: 1. No acute osseous or cord signal abnormality. 2. Minimal discogenic degenerative changes of the cervical spine. No significant foraminal or canal stenosis. Electronically Signed   By: Mitzi Hansen M.D.   On: 07/03/2018 00:48   Mr Cervical Spine Wo Contrast  Result Date: 07/03/2018 CLINICAL DATA:  33 y/o F; headache. History of migraine and spinal fluid leak in the past. EXAM: MRI HEAD WITHOUT CONTRAST MRI CERVICAL SPINE WITHOUT CONTRAST TECHNIQUE: Multiplanar, multiecho pulse sequences of the brain and surrounding structures, and cervical spine, to include the craniocervical junction and cervicothoracic junction, were obtained without intravenous contrast. COMPARISON:  03/28/2018 MRI head. FINDINGS: MRI HEAD FINDINGS Brain: No acute infarction, hemorrhage, hydrocephalus, extra-axial collection or mass lesion. No structural or signal abnormality of the brain. Vascular: Normal flow voids. Skull and upper cervical spine: Normal marrow signal. Sinuses/Orbits: Negative. Other: None. MRI CERVICAL SPINE FINDINGS Alignment: Straightening of cervical lordosis without listhesis. Vertebrae: No fracture,  evidence of discitis, or bone lesion. 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 Cord: Normal signal and morphology. Posterior Fossa, vertebral arteries, paraspinal tissues: Negative. Disc levels: Tiny C3-4 central protrusion with ventral thecal sac effacement. Mild loss of intervertebral disc space height the C4-C6 levels. Otherwise no significant disc displacement, foraminal stenosis, or canal stenosis. IMPRESSION: MRI head: Stable normal MRI of the head.  No acute process identified. MRI cervical spine: 1. No acute osseous or cord signal abnormality. 2. Minimal discogenic degenerative changes of the cervical spine. No significant foraminal or canal stenosis. Electronically Signed   By: Mitzi Hansen M.D.   On: 07/03/2018 00:48   US Ob Comp < 14 Wks  Result Date: 06/14/2018 CLINICAL DATA:  Pelvic pain and pressure for the past day. Estimated gestational age of [redacted] weeks, 1 day by LMP. EXAM: OBSTETRIC <14 WK [place] AND TRANSVAGINAL OB US TECHNIQUE: Both transabdominal and transvaginal ultrasound examinations were performed for complete evaluation of the gestation as well as the maternal uterus, adnexal regions, and pelvic cul-de-sac. Transvaginal technique was  performed to assess early pregnancy. COMPARISON:  CT abdomen pelvis dated February 20, 2018. FINDINGS: Intrauterine gestational sac: None Yolk sac:  Not Visualized. Embryo:  Not Visualized. Maternal uterus/adnexae: Unremarkable. Trace free fluid in the pelvis, likely physiologic. IMPRESSION: 1.  No IUP is visualized. By definition, in the setting of a positive pregnancy test, this reflects a pregnancy of unknown location. Differential considerations include early normal IUP, abnormal IUP/missed abortion, or nonvisualized ectopic pregnancy. Serial beta HCG is suggested. Consider repeat pelvic ultrasound in 14 days. Electronically Signed   By: Obie DredgeWilliam T Derry M.D.   On: 06/14/2018 11:38   [place] Ob Transvaginal  Result Date:  06/14/2018 CLINICAL DATA:  Pelvic pain and pressure for the past day. Estimated gestational age of [redacted] weeks, 1 day by LMP. EXAM: OBSTETRIC <14 WK US AND TRANSVAGINAL OB US TECHNIQUE: Both transabdominal and transvaginal ultrasound examinations were performed for complete evaluation of the gestation as well as the maternal uterus, adnexal regions, and pelvic cul-de-sac. Transvaginal technique was performed to assess early pregnancy. COMPARISON:  CT abdomen pelvis dated February 20, 2018. FINDINGS: Intrauterine gestational sac: None Yolk sac:  Not Visualized. Embryo:  Not Visualized. Maternal uterus/adnexae: Unremarkable. Trace free fluid in the pelvis, likely physiologic. IMPRESSION: 1.  No IUP is visualized. By definition, in the setting of a positive pregnancy test, this reflects a pregnancy of unknown location. Differential considerations include early normal IUP, abnormal IUP/missed abortion, or nonvisualized ectopic pregnancy. Serial beta HCG is suggested. Consider repeat pelvic ultrasound in 14 days. Electronically Signed   By: Obie DredgeWilliam T Derry M.D.   On: 06/14/2018 11:38    Catarina Hartshornavid Asako Saliba, DO  [facility] Pager [phone]  If 7PM-7AM, please contact night-coverage [email or web address] Password [email or web address] 07/09/2018, 8:07 AM   LOS: 0 days

## 2018-07-09 NOTE — Telephone Encounter (Signed)
Id patient has seizures she need to go to the emergency room. thanks

## 2018-07-09 NOTE — Discharge Summary (Signed)
Physician Discharge Summary  Sonya AlmondHolly Gibson ZOX:[id number] DOB: 12/18/1985 DOA: 07/08/2018  PCP: Patient, No Pcp Per  Admit date: 07/08/2018 Discharge date: 07/10/2018  Admitted From: Home Disposition:  Home   Recommendations for Outpatient Follow-up:  1. Follow up with PCP in 1-2 weeks 2. Please obtain BMP/CBC in one week    Discharge Condition: Stable CODE STATUS: FULL Diet recommendation: Heart Healthy / Carb Modified   Brief/Interim Summary: Brief History:  33 year old female with a history of migraine headaches, hypertension, PTSD, diabetes mellitus presenting with 2 seizure episodes at home on the evening of 07/08/2018.  Apparently, the patient was lying in bed watching television when her husband witnessed a tonic-clonic type activity lasting approximately 30 seconds.  The patient had incontinence of her urine.  She had a short postictal period after which she was able to converse without difficulty.  Subsequently, the patient had another seizure 30 minutes later lasting 1 minute.  As result, the patient presented for further evaluation.  She had another tonic-clonic type activity in the emergency department.  The patient was recently mated to the hospital from 07/02/2018 through 07/03/2018 during which she was treated for an intractable headache.  The patient was told to stop her Klonopin and gabapentin at that time.  She was discharged with Fioricet.  Although review of PMP Aware shows that the patient has not had a prescription for clonazepam filled since 12/12/2017. 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  She was admitted to the hospital from 12/20/2017 through 01/16/2018  for pseudomeningoceleand taken to the operating roommultiple timesfor lumbar reexploration with repair of CSF leak and placement of lumbar drain. She had a long and complicated hospital course. The  lumbar drain remained in place for 8 days and a CT myelogram suggested no evidence of leak. The lumbar drain was removed. She then once again started to leak spinal fluid from her wound. She was taken back to the operating room for exploration and placement of lumboperitoneal shunt.  She was found to have had a tear through her suture line.  The dura was resutured, and a lumbar drain was placed.  It was decided not to place a lumboperitoneal shunt at that time.  In addition, the patient had a recent admission from 07/02/2018 through 07/03/2018.  During that admission, the patient was treated for intractable headache.  She was told to stop her clonazepam and gabapentin.  However, in my review of PMP Aware the patient has not had a prescription clonazepam filled 12/12/2017. On the day of discharge, the patient changed her story stating that she had not been taking clonazepam for several months.  In addition, although she continued to claim having nausea and vomiting, the patient was noted to tolerate her dinner and breakfast with no difficulty.  Regarding her chronic headaches, the patient was placed back on her home dose of Percocet.  She was treated with Toradol, Benadryl, and Compazine IV for her headaches during her inpatient stay.  There was no neuro deficits on examination.  The case was discussed with the patient's neurologist, Dr, Lucia GaskinsAhern, whom graciously agreed to have the patient follow-up in the office after discharge.   Discharge Diagnoses:  New onset seizure -EEG--neg -UA--no pyuria -Urine drug screen--positive benzodiazepine and barbiturates -07/02/2018 MR brain--negative for acute findings -07/02/2018 MRI of cervical spine negative for acute findings, mild DJD -Continue Keppra after discharge -Gabapentin restarted--she claims to have a supply  at home -Discontinue tramadol -Case was discussed with the patient's neurologist, Dr. Lucia Gaskins.  She will see pt back in clinic -As discussed above,  the patient will remain off clonazepam.  She has not had any clonazepam refilled in the outpatient setting since 12/12/2017.  Sinus tachycardia -Check d-dimer--0.51 -07/03/2018 TSH 1.617 -CTA chest--negative for PE. Scattered ill-defined areas of relative geographic ground-glass within the bilateral lower lobes are favored to represent areas of air trapping -Urine drug screen--positive benzodiazepine and barbiturates -Place on telemetry--personally reviewed--no concerning dysrhythmia -Echocardiogram--EF 65-70%, no WMA, trivial MR -Heart rate overall improved from the 120s to 100-110 -Metoprolol tartrate 12.5 mg twice daily started  Recurrent nausea and vomiting -Apparently this is been chronic for the patient since her lumbar surgery -Start around-the-clock Zofran -Started PPI during hospitalization -IVF -Interestingly, the patient was able to tolerate her carbohydrate modified diet during the hospitalization  Diabetes mellitus type 2 -Hemoglobin A1c--6.5 -NovoLog sliding scale -Holding metformin--resume after d/c  PTSD/anxiety -Continue venlafaxine -The [place] Controlled Substance Reporting System has been queried for this patient--patient has not had a prescription for clonazepam filled since 12/12/2017 -Prescription for Percocet filled 07/06/2018 10/325, #28 prescribed Dr. Lucia Gaskins; no prior Percocet or oxycodone prescription since 03/13/2018  Morbid Obesity -BMI 63.2 -lifestyle modification    Discharge Instructions   Allergies as of 07/10/2018      Reactions   Cinnamon Anaphylaxis   (NO "REAL"/TREE BARK CINNAMON)   Coconut Oil Anaphylaxis   Can eat "fake" coconut   Lovenox [enoxaparin] Other (See Comments)   BROKE DOWN THE SKIN AT INJECTION SITE AND CAUSED A WOUND   Nitrofurantoin Hives   Other    "Headache cocktail"   Blister to abd      Medication List    TAKE these medications   acetaminophen 325 MG tablet Commonly known as:  TYLENOL Take 650 mg by  mouth every 6 (six) hours as needed.   Butalbital-APAP-Caffeine 50-300-40 MG Caps Take 1 capsule by mouth every 4 (four) hours as needed (headache).   calcium carbonate 750 MG chewable tablet Commonly known as:  TUMS EX Chew 2 tablets by mouth as needed for heartburn.   cyanocobalamin 1000 MCG/ML injection Commonly known as:  (VITAMIN B-12) Inject 1,000 mcg into the muscle See admin instructions. Every two weeks   cyclobenzaprine 10 MG tablet Commonly known as:  FLEXERIL Take 1 tablet (10 mg total) by mouth 3 (three) times daily as needed for muscle spasms.   diphenhydrAMINE 50 MG capsule Commonly known as:  BENADRYL Take 50 mg by mouth every 6 (six) hours as needed.   ibuprofen 200 MG tablet Commonly known as:  ADVIL,MOTRIN Take 400 mg by mouth every 6 (six) hours as needed.   levETIRAcetam 500 MG tablet Commonly known as:  KEPPRA Take 1 tablet (500 mg total) by mouth 2 (two) times daily.   Melatonin 10 MG Tabs Take 10 mg by mouth daily as needed (sleep).   metFORMIN 500 MG tablet Commonly known as:  GLUCOPHAGE Take 1 tablet by mouth 2 (two) times daily with a meal.   metoprolol tartrate 25 MG tablet Commonly known as:  LOPRESSOR Take 0.5 tablets (12.5 mg total) by mouth 2 (two) times daily.   oxyCODONE-acetaminophen 10-325 MG tablet Commonly known as:  PERCOCET Take 1 tablet by mouth every 6 (six) hours as needed for pain.   prenatal multivitamin Tabs tablet Take 2 tablets by mouth Nightly.   promethazine 25 MG tablet Commonly known as:  PHENERGAN Take 1 tablet (25  mg total) by mouth every 6 (six) hours as needed for nausea or vomiting.   traZODone 150 MG tablet Commonly known as:  DESYREL Take 150 mg by mouth at bedtime as needed for sleep.   venlafaxine XR 150 MG 24 hr capsule Commonly known as:  EFFEXOR-XR Take 150 mg by mouth at bedtime.   venlafaxine XR 75 MG 24 hr capsule Commonly known as:  EFFEXOR-XR Take 75 mg by mouth at bedtime.   VENTOLIN  HFA 108 (90 Base) MCG/ACT inhaler Generic drug:  albuterol Inhale 2 puffs into the lungs every 6 (six) hours as needed for wheezing or shortness of breath.   Vitamin D (Ergocalciferol) 1.25 MG (50000 UT) Caps capsule Commonly known as:  DRISDOL Take 50,000 Units by mouth every 7 (seven) days.      Follow-up Information    [facility]. Schedule an appointment as soon as possible for a visit.   Specialty:  Behavioral Health Why:  Please contact them and schedule an appointment for psychiatrist appointment as well as therapy appointment. Contact information: [address] [place] [address] [phone]         Allergies  Allergen Reactions  . Cinnamon Anaphylaxis    (NO "REAL"/TREE BARK CINNAMON)  . Coconut Oil Anaphylaxis    Can eat "fake" coconut  . Lovenox [Enoxaparin] Other (See Comments)    BROKE DOWN THE SKIN AT INJECTION SITE AND CAUSED A WOUND  . Nitrofurantoin Hives  . Other     "Headache cocktail"   Blister to abd    Consultations:  none   Procedures/Studies: Ct Head Wo Contrast  Result Date: 07/09/2018 CLINICAL DATA:  New seizures.  Acute vision disturbance EXAM: CT HEAD WITHOUT CONTRAST TECHNIQUE: Contiguous axial images were obtained from the base of the skull through the vertex without intravenous contrast. COMPARISON:  Brain MRI 07/02/2018 FINDINGS: Brain: No evidence of infarction, hemorrhage, hydrocephalus, extra-axial collection or mass lesion/mass effect. Vascular: No hyperdense vessel or unexpected calcification. Skull: Normal. Negative for fracture or focal lesion. Sinuses/Orbits: Negative IMPRESSION: Negative head CT. Electronically Signed   By: Marnee Spring M.D.   On: 07/09/2018 06:36   Ct Angio Chest Pe W Or Wo Contrast  Result Date: 07/09/2018 CLINICAL DATA:  Elevated D-dimer.  Evaluate for pulmonary embolism. EXAM: CT ANGIOGRAPHY CHEST WITH CONTRAST TECHNIQUE:  Multidetector CT imaging of the chest was performed using the standard protocol during bolus administration of intravenous contrast. Multiplanar CT image reconstructions and MIPs were obtained to evaluate the vascular anatomy. CONTRAST:  ISOVUE-370 IOPAMIDOL (ISOVUE-370) INJECTION 76% COMPARISON:  Chest CT-12/08/2017; 08/24/2016; 06/30/2016 FINDINGS: Vascular Findings: There is suboptimal opacification of the pulmonary arterial system with the main pulmonary artery measuring only 200 Hounsfield units. Given this limitation, there are no discrete filling defects within the central pulmonary arterial tree to suggest pulmonary embolism. Evaluation of the distal subsegmental pulmonary arteries is degraded secondary to a combination of suboptimal vessel opacification as well as quantum mottle artifact due to patient body habitus. Normal caliber of the main pulmonary artery. Normal heart size.  No pericardial effusion. Normal caliber of the thoracic aorta. No evidence of thoracic aortic aneurysm, dissection or periaortic stranding on this nongated examination. Conventional configuration of the aortic arch. The branch vessels of the aortic arch appear widely patent throughout their imaged courses. Review of the MIP images confirms the above findings. ---------------------------------------------------------------------------------- Nonvascular Findings: Mediastinum/Lymph Nodes: No bulky mediastinal, hilar or axillary lymphadenopathy. Lungs/Pleura: Minimal dependent subpleural ground-glass atelectasis.  Scattered ill-defined areas of relative geographic ground-glass within the bilateral lower lobes are favored to represent areas of air trapping. No discrete focal airspace opacities. No pleural effusion or pneumothorax. The central pulmonary airways appear widely patent. Punctate (approximately 3 mm) granuloma within the right middle lobe (image 47, series 7) is unchanged compared to the 06/2016 examination. No discrete  new pulmonary nodules. Upper abdomen: Limited early arterial phase evaluation of the upper abdomen demonstrates diffuse decreased attenuation of the hepatic parenchyma compatible with hepatic steatosis. Musculoskeletal: No acute or aggressive osseous abnormalities. Regional soft tissues appear normal. Normal appearance of the imaged portions of the thyroid gland. IMPRESSION: 1. No acute cardiopulmonary disease. Specifically, no evidence of pulmonary embolism to the level the bilateral subsegmental pulmonary arteries. 2. Findings compatible with hepatic steatosis. Correlation with LFTs is advised. Electronically Signed   By: Simonne Come M.D.   On: 07/09/2018 15:39   Mr Brain Wo Contrast  Result Date: 07/03/2018 CLINICAL DATA:  33 y/o F; headache. History of migraine and spinal fluid leak in the past. EXAM: MRI HEAD WITHOUT CONTRAST MRI CERVICAL SPINE WITHOUT CONTRAST TECHNIQUE: Multiplanar, multiecho pulse sequences of the brain and surrounding structures, and cervical spine, to include the craniocervical junction and cervicothoracic junction, were obtained without intravenous contrast. COMPARISON:  03/28/2018 MRI head. FINDINGS: MRI HEAD FINDINGS Brain: No acute infarction, hemorrhage, hydrocephalus, extra-axial collection or mass lesion. No structural or signal abnormality of the brain. Vascular: Normal flow voids. Skull and upper cervical spine: Normal marrow signal. Sinuses/Orbits: Negative. Other: None. MRI CERVICAL SPINE FINDINGS Alignment: Straightening of cervical lordosis without listhesis. Vertebrae: No fracture, evidence of discitis, or bone lesion. 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 Cord: Normal signal and morphology. Posterior Fossa, vertebral arteries, paraspinal tissues: Negative. Disc levels: Tiny C3-4 central protrusion with ventral thecal sac effacement. Mild loss of intervertebral disc space height the C4-C6 levels. Otherwise no significant disc displacement,  foraminal stenosis, or canal stenosis. IMPRESSION: MRI head: Stable normal MRI of the head.  No acute process identified. MRI cervical spine: 1. No acute osseous or cord signal abnormality. 2. Minimal discogenic degenerative changes of the cervical spine. No significant foraminal or canal stenosis. Electronically Signed   By: Mitzi Hansen M.D.   On: 07/03/2018 00:48   Mr Cervical Spine Wo Contrast  Result Date: 07/03/2018 CLINICAL DATA:  33 y/o F; headache. History of migraine and spinal fluid leak in the past. EXAM: MRI HEAD WITHOUT CONTRAST MRI CERVICAL SPINE WITHOUT CONTRAST TECHNIQUE: Multiplanar, multiecho pulse sequences of the brain and surrounding structures, and cervical spine, to include the craniocervical junction and cervicothoracic junction, were obtained without intravenous contrast. COMPARISON:  03/28/2018 MRI head. FINDINGS: MRI HEAD FINDINGS Brain: No acute infarction, hemorrhage, hydrocephalus, extra-axial collection or mass lesion. No structural or signal abnormality of the brain. Vascular: Normal flow voids. Skull and upper cervical spine: Normal marrow signal. Sinuses/Orbits: Negative. Other: None. MRI CERVICAL SPINE FINDINGS Alignment: Straightening of cervical lordosis without listhesis. Vertebrae: No fracture, evidence of discitis, or bone lesion. 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 Cord: Normal signal and morphology. Posterior Fossa, vertebral arteries, paraspinal tissues: Negative. Disc levels: Tiny C3-4 central protrusion with ventral thecal sac effacement. Mild loss of intervertebral disc space height the C4-C6 levels. Otherwise no significant disc displacement, foraminal stenosis, or canal stenosis. IMPRESSION: MRI head: Stable normal MRI of the head.  No acute process identified. MRI cervical spine: 1. No acute osseous or cord signal abnormality. 2. Minimal discogenic degenerative changes  of the cervical spine. No significant foraminal or canal  stenosis. Electronically Signed   By: Mitzi Hansen M.D.   On: 07/03/2018 00:48   US Ob Comp < 14 Wks  Result Date: 06/14/2018 CLINICAL DATA:  Pelvic pain and pressure for the past day. Estimated gestational age of [redacted] weeks, 1 day by LMP. EXAM: OBSTETRIC <14 WK [place] AND TRANSVAGINAL OB US TECHNIQUE: Both transabdominal and transvaginal ultrasound examinations were performed for complete evaluation of the gestation as well as the maternal uterus, adnexal regions, and pelvic cul-de-sac. Transvaginal technique was performed to assess early pregnancy. COMPARISON:  CT abdomen pelvis dated February 20, 2018. FINDINGS: Intrauterine gestational sac: None Yolk sac:  Not Visualized. Embryo:  Not Visualized. Maternal uterus/adnexae: Unremarkable. Trace free fluid in the pelvis, likely physiologic. IMPRESSION: 1.  No IUP is visualized. By definition, in the setting of a positive pregnancy test, this reflects a pregnancy of unknown location. Differential considerations include early normal IUP, abnormal IUP/missed abortion, or nonvisualized ectopic pregnancy. Serial beta HCG is suggested. Consider repeat pelvic ultrasound in 14 days. Electronically Signed   By: Obie Dredge M.D.   On: 06/14/2018 11:38   US Ob Transvaginal  Result Date: 06/14/2018 CLINICAL DATA:  Pelvic pain and pressure for the past day. Estimated gestational age of [redacted] weeks, 1 day by LMP. EXAM: OBSTETRIC <14 WK [place] AND TRANSVAGINAL OB US TECHNIQUE: Both transabdominal and transvaginal ultrasound examinations were performed for complete evaluation of the gestation as well as the maternal uterus, adnexal regions, and pelvic cul-de-sac. Transvaginal technique was performed to assess early pregnancy. COMPARISON:  CT abdomen pelvis dated February 20, 2018. FINDINGS: Intrauterine gestational sac: None Yolk sac:  Not Visualized. Embryo:  Not Visualized. Maternal uterus/adnexae: Unremarkable. Trace free fluid in the pelvis, likely physiologic.  IMPRESSION: 1.  No IUP is visualized. By definition, in the setting of a positive pregnancy test, this reflects a pregnancy of unknown location. Differential considerations include early normal IUP, abnormal IUP/missed abortion, or nonvisualized ectopic pregnancy. Serial beta HCG is suggested. Consider repeat pelvic ultrasound in 14 days. Electronically Signed   By: Obie Dredge M.D.   On: 06/14/2018 11:38        Discharge Exam: Vitals:   07/09/18 2134 07/10/18 0621  BP: 115/73 105/78  Pulse: 98 98  Resp: 20 20  Temp: 98.1 F (36.7 C) 97.7 F (36.5 C)  SpO2: 95% 99%   Vitals:   07/09/18 0610 07/09/18 1241 07/09/18 2134 07/10/18 0621  BP: 127/80 109/68 115/73 105/78  Pulse: (!) 113 100 98 98  Resp: 18 18 20 20   Temp:  98.3 F (36.8 C) 98.1 F (36.7 C) 97.7 F (36.5 C)  TempSrc:  Oral Oral Oral  SpO2: 97% 97% 95% 99%  Weight:  (!) 151.9 kg    Height:        General: Pt is alert, awake, not in acute distress Cardiovascular: RRR, S1/S2 +, no rubs, no gallops Respiratory: CTA bilaterally, no wheezing, no rhonchi Abdominal: Soft, NT, ND, bowel sounds + Extremities: no edema, no cyanosis   The results of significant diagnostics from this hospitalization (including imaging, microbiology, ancillary and laboratory) are listed below for reference.    Significant Diagnostic Studies: Ct Head Wo Contrast  Result Date: 07/09/2018 CLINICAL DATA:  New seizures.  Acute vision disturbance EXAM: CT HEAD WITHOUT CONTRAST TECHNIQUE: Contiguous axial images were obtained from the base of the skull through the vertex without intravenous contrast. COMPARISON:  Brain MRI 07/02/2018 FINDINGS: Brain: No evidence of  infarction, hemorrhage, hydrocephalus, extra-axial collection or mass lesion/mass effect. Vascular: No hyperdense vessel or unexpected calcification. Skull: Normal. Negative for fracture or focal lesion. Sinuses/Orbits: Negative IMPRESSION: Negative head CT. Electronically Signed   By:  Marnee Spring M.D.   On: 07/09/2018 06:36   Ct Angio Chest Pe W Or Wo Contrast  Result Date: 07/09/2018 CLINICAL DATA:  Elevated D-dimer.  Evaluate for pulmonary embolism. EXAM: CT ANGIOGRAPHY CHEST WITH CONTRAST TECHNIQUE: Multidetector CT imaging of the chest was performed using the standard protocol during bolus administration of intravenous contrast. Multiplanar CT image reconstructions and MIPs were obtained to evaluate the vascular anatomy. CONTRAST:  ISOVUE-370 IOPAMIDOL (ISOVUE-370) INJECTION 76% COMPARISON:  Chest CT-12/08/2017; 08/24/2016; 06/30/2016 FINDINGS: Vascular Findings: There is suboptimal opacification of the pulmonary arterial system with the main pulmonary artery measuring only 200 Hounsfield units. Given this limitation, there are no discrete filling defects within the central pulmonary arterial tree to suggest pulmonary embolism. Evaluation of the distal subsegmental pulmonary arteries is degraded secondary to a combination of suboptimal vessel opacification as well as quantum mottle artifact due to patient body habitus. Normal caliber of the main pulmonary artery. Normal heart size.  No pericardial effusion. Normal caliber of the thoracic aorta. No evidence of thoracic aortic aneurysm, dissection or periaortic stranding on this nongated examination. Conventional configuration of the aortic arch. The branch vessels of the aortic arch appear widely patent throughout their imaged courses. Review of the MIP images confirms the above findings. ---------------------------------------------------------------------------------- Nonvascular Findings: Mediastinum/Lymph Nodes: No bulky mediastinal, hilar or axillary lymphadenopathy. Lungs/Pleura: Minimal dependent subpleural ground-glass atelectasis. Scattered ill-defined areas of relative geographic ground-glass within the bilateral lower lobes are favored to represent areas of air trapping. No discrete focal airspace opacities. No pleural  effusion or pneumothorax. The central pulmonary airways appear widely patent. Punctate (approximately 3 mm) granuloma within the right middle lobe (image 47, series 7) is unchanged compared to the 06/2016 examination. No discrete new pulmonary nodules. Upper abdomen: Limited early arterial phase evaluation of the upper abdomen demonstrates diffuse decreased attenuation of the hepatic parenchyma compatible with hepatic steatosis. Musculoskeletal: No acute or aggressive osseous abnormalities. Regional soft tissues appear normal. Normal appearance of the imaged portions of the thyroid gland. IMPRESSION: 1. No acute cardiopulmonary disease. Specifically, no evidence of pulmonary embolism to the level the bilateral subsegmental pulmonary arteries. 2. Findings compatible with hepatic steatosis. Correlation with LFTs is advised. Electronically Signed   By: Simonne Come M.D.   On: 07/09/2018 15:39   Mr Brain Wo Contrast  Result Date: 07/03/2018 CLINICAL DATA:  33 y/o F; headache. History of migraine and spinal fluid leak in the past. EXAM: MRI HEAD WITHOUT CONTRAST MRI CERVICAL SPINE WITHOUT CONTRAST TECHNIQUE: Multiplanar, multiecho pulse sequences of the brain and surrounding structures, and cervical spine, to include the craniocervical junction and cervicothoracic junction, were obtained without intravenous contrast. COMPARISON:  03/28/2018 MRI head. FINDINGS: MRI HEAD FINDINGS Brain: No acute infarction, hemorrhage, hydrocephalus, extra-axial collection or mass lesion. No structural or signal abnormality of the brain. Vascular: Normal flow voids. Skull and upper cervical spine: Normal marrow signal. Sinuses/Orbits: Negative. Other: None. MRI CERVICAL SPINE FINDINGS Alignment: Straightening of cervical lordosis without listhesis. Vertebrae: No fracture, evidence of discitis, or bone lesion. 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 Cord: Normal signal and morphology. Posterior Fossa, vertebral  arteries, paraspinal tissues: Negative. Disc levels: Tiny C3-4 central protrusion with ventral thecal sac effacement. Mild loss of intervertebral disc space height the C4-C6 levels. Otherwise no significant disc  displacement, foraminal stenosis, or canal stenosis. IMPRESSION: MRI head: Stable normal MRI of the head.  No acute process identified. MRI cervical spine: 1. No acute osseous or cord signal abnormality. 2. Minimal discogenic degenerative changes of the cervical spine. No significant foraminal or canal stenosis. Electronically Signed   By: Mitzi Hansen M.D.   On: 07/03/2018 00:48   Mr Cervical Spine Wo Contrast  Result Date: 07/03/2018 CLINICAL DATA:  33 y/o F; headache. History of migraine and spinal fluid leak in the past. EXAM: MRI HEAD WITHOUT CONTRAST MRI CERVICAL SPINE WITHOUT CONTRAST TECHNIQUE: Multiplanar, multiecho pulse sequences of the brain and surrounding structures, and cervical spine, to include the craniocervical junction and cervicothoracic junction, were obtained without intravenous contrast. COMPARISON:  03/28/2018 MRI head. FINDINGS: MRI HEAD FINDINGS Brain: No acute infarction, hemorrhage, hydrocephalus, extra-axial collection or mass lesion. No structural or signal abnormality of the brain. Vascular: Normal flow voids. Skull and upper cervical spine: Normal marrow signal. Sinuses/Orbits: Negative. Other: None. MRI CERVICAL SPINE FINDINGS Alignment: Straightening of cervical lordosis without listhesis. Vertebrae: No fracture, evidence of discitis, or bone lesion. 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 Cord: Normal signal and morphology. Posterior Fossa, vertebral arteries, paraspinal tissues: Negative. Disc levels: Tiny C3-4 central protrusion with ventral thecal sac effacement. Mild loss of intervertebral disc space height the C4-C6 levels. Otherwise no significant disc displacement, foraminal stenosis, or canal stenosis. IMPRESSION: MRI head:  Stable normal MRI of the head.  No acute process identified. MRI cervical spine: 1. No acute osseous or cord signal abnormality. 2. Minimal discogenic degenerative changes of the cervical spine. No significant foraminal or canal stenosis. Electronically Signed   By: Mitzi Hansen M.D.   On: 07/03/2018 00:48   US Ob Comp < 14 Wks  Result Date: 06/14/2018 CLINICAL DATA:  Pelvic pain and pressure for the past day. Estimated gestational age of [redacted] weeks, 1 day by LMP. EXAM: OBSTETRIC <14 WK [place] AND TRANSVAGINAL OB US TECHNIQUE: Both transabdominal and transvaginal ultrasound examinations were performed for complete evaluation of the gestation as well as the maternal uterus, adnexal regions, and pelvic cul-de-sac. Transvaginal technique was performed to assess early pregnancy. COMPARISON:  CT abdomen pelvis dated February 20, 2018. FINDINGS: Intrauterine gestational sac: None Yolk sac:  Not Visualized. Embryo:  Not Visualized. Maternal uterus/adnexae: Unremarkable. Trace free fluid in the pelvis, likely physiologic. IMPRESSION: 1.  No IUP is visualized. By definition, in the setting of a positive pregnancy test, this reflects a pregnancy of unknown location. Differential considerations include early normal IUP, abnormal IUP/missed abortion, or nonvisualized ectopic pregnancy. Serial beta HCG is suggested. Consider repeat pelvic ultrasound in 14 days. Electronically Signed   By: Obie Dredge M.D.   On: 06/14/2018 11:38   US Ob Transvaginal  Result Date: 06/14/2018 CLINICAL DATA:  Pelvic pain and pressure for the past day. Estimated gestational age of [redacted] weeks, 1 day by LMP. EXAM: OBSTETRIC <14 WK [place] AND TRANSVAGINAL OB US TECHNIQUE: Both transabdominal and transvaginal ultrasound examinations were performed for complete evaluation of the gestation as well as the maternal uterus, adnexal regions, and pelvic cul-de-sac. Transvaginal technique was performed to assess early pregnancy. COMPARISON:  CT  abdomen pelvis dated February 20, 2018. FINDINGS: Intrauterine gestational sac: None Yolk sac:  Not Visualized. Embryo:  Not Visualized. Maternal uterus/adnexae: Unremarkable. Trace free fluid in the pelvis, likely physiologic. IMPRESSION: 1.  No IUP is visualized. By definition, in the setting of a positive pregnancy test, this reflects a pregnancy of unknown  location. Differential considerations include early normal IUP, abnormal IUP/missed abortion, or nonvisualized ectopic pregnancy. Serial beta HCG is suggested. Consider repeat pelvic ultrasound in 14 days. Electronically Signed   By: Obie Dredge M.D.   On: 06/14/2018 11:38     Microbiology: No results found for this or any previous visit (from the past 240 hour(s)).   Labs: Basic Metabolic Panel: Recent Labs  Lab 07/08/18 1429 07/08/18 1438 07/09/18 0935 07/10/18 0603  NA 135  --  137 139  K 3.9  --  4.1 4.0  CL 100  --  104 105  CO2 24  --  23 25  GLUCOSE 146*  --  187* 172*  BUN 11  --  10 10  CREATININE 0.76  --  0.84 0.72  CALCIUM 8.8*  --  8.7* 8.3*  MG  --  2.3  --   --   PHOS  --  3.0  --   --    Liver Function Tests: Recent Labs  Lab 07/10/18 0603  AST 60*  ALT 63*  ALKPHOS 75  BILITOT 0.3  PROT 6.2*  ALBUMIN 3.3*   No results for input(s): LIPASE, AMYLASE in the last 168 hours. No results for input(s): AMMONIA in the last 168 hours. CBC: Recent Labs  Lab 07/08/18 1429 07/10/18 0603  WBC 8.9 5.7  NEUTROABS 4.0  --   HGB 11.8* 10.7*  HCT 37.9 35.2*  MCV 90.2 94.9  PLT 380 287   Cardiac Enzymes: Recent Labs  Lab 07/09/18 0935  CKTOTAL 71   BNP: Invalid input(s): POCBNP CBG: Recent Labs  Lab 07/08/18 2210 07/09/18 0824 07/09/18 1305 07/09/18 2135 07/10/18 0747  GLUCAP 137* 106* 170* 219* 222*    Time coordinating discharge:  36 minutes  Signed:  Catarina Hartshorn, DO [facility] Pager: [phone] 07/10/2018, 9:15 AM

## 2018-07-09 NOTE — ED Notes (Signed)
Patient transported to CT 

## 2018-07-09 NOTE — Clinical Social Work Note (Addendum)
LCSW spoke with patient in regards to out patient behavioral health needs. Patient indicated that she was in need of both therapy and psychiatrist appointments.  LCSW discussed follow up options.   LCSW attempted to make an appointment for patient at [facility] in [place], however the office was closed due to the [facility] holiday. LCSW advised patient of this.   Contact information to [facility] in [place] was added to patient's discharge providers with instructions to schedule appointment within one day of discharge. Patient agreeable.   Patient discussed that she had previously been to [facility] in [place], however the barrier to continuing with [facility] was that they did not prescribe controlled substances and she needs her Klonopin for her Anxiety.   Patient to contact [facility] in [place] to schedule appointment when discharged.  LCSW signing off.     Sonya Gibson, Juleen China, LCSW

## 2018-07-09 NOTE — ED Notes (Signed)
Pt's IV became dislodged when getting out of bed going to floor.

## 2018-07-09 NOTE — Telephone Encounter (Signed)
[facility] .  Waiting for Dr. Trevor Mace Note to be finished and Waiting for Referral . I have talked to patient she is going to bring MRI disc when I spoke to [facility] they have to be mailed. Patient is aware she has to bring CD's or apt can't happen.   I will Give Duwayne Heck what has to be mailed to [facility] when Patient come's to Bring her CD's and she is aware to ask for Danielle.   [facility] MRN # [id number]  [facility] Referral contact's telephone [phone] opt 2 -- Amy is the Contact . Dr. Sonia Baller contact Telephone [phone] - Fax [phone]   Thanks Annabelle Harman

## 2018-07-09 NOTE — Progress Notes (Signed)
EEG complete - results pending 

## 2018-07-09 NOTE — Telephone Encounter (Signed)
I spoke to Dr. Arbutus Leas at the hospital, we will see her in outpatient this week. Appreciate his wonderful care of our patient. When she calls give her a Friday appointment, place one on hold fo rher thanks

## 2018-07-09 NOTE — Procedures (Signed)
History: 33 yo F with seizure disorder  Sedation: None  Technique: This is a 21 channel routine scalp EEG performed at the bedside with bipolar and monopolar montages arranged in accordance to the international 10/20 system of electrode placement. One channel was dedicated to EKG recording.    Background: There is significant artifact due to patient's head movements associated with respiration, as well as very prominent EKG artifact. The majority of this EEG appears to be normal sleep. With arousal, there is a well formed posterior dominant rhythm of 10 Hz.   Photic stimulation: Physiologic driving is not performed  EEG Abnormalities: None  Clinical Interpretation: This normal EEG is recorded in the waking and sleep state. There was no seizure or seizure predisposition recorded on this study. Please note that lack of epileptiform activity on EEG does not preclude the possibility of epilepsy.   Sonya Slot, MD [facility] [phone]  If 7pm- 7am, please page neurology on call as listed in [facility]ION.

## 2018-07-10 DIAGNOSIS — Z6841 Body Mass Index (BMI) 40.0 and over, adult: Secondary | ICD-10-CM | POA: Diagnosis not present

## 2018-07-10 DIAGNOSIS — R569 Unspecified convulsions: Secondary | ICD-10-CM | POA: Diagnosis not present

## 2018-07-10 DIAGNOSIS — F4310 Post-traumatic stress disorder, unspecified: Secondary | ICD-10-CM | POA: Diagnosis not present

## 2018-07-10 LAB — URINALYSIS, COMPLETE (UACMP) WITH MICROSCOPIC
Bilirubin Urine: NEGATIVE
Glucose, UA: NEGATIVE mg/dL
Hgb urine dipstick: NEGATIVE
KETONES UR: NEGATIVE mg/dL
Leukocytes, UA: NEGATIVE
Nitrite: NEGATIVE
Protein, ur: NEGATIVE mg/dL
Specific Gravity, Urine: 1.023 (ref 1.005–1.030)
pH: 5 (ref 5.0–8.0)

## 2018-07-10 LAB — COMPREHENSIVE METABOLIC PANEL
ALT: 63 U/L — ABNORMAL HIGH (ref 0–44)
AST: 60 U/L — ABNORMAL HIGH (ref 15–41)
Albumin: 3.3 g/dL — ABNORMAL LOW (ref 3.5–5.0)
Alkaline Phosphatase: 75 U/L (ref 38–126)
Anion gap: 9 (ref 5–15)
BUN: 10 mg/dL (ref 6–20)
CO2: 25 mmol/L (ref 22–32)
Calcium: 8.3 mg/dL — ABNORMAL LOW (ref 8.9–10.3)
Chloride: 105 mmol/L (ref 98–111)
Creatinine, Ser: 0.72 mg/dL (ref 0.44–1.00)
GFR calc Af Amer: >60 mL/min (ref >60)
GFR calc non Af Amer: >60 mL/min (ref >60)
Glucose, Bld: 172 mg/dL — ABNORMAL HIGH (ref 70–99)
Potassium: 4 mmol/L (ref 3.5–5.1)
Sodium: 139 mmol/L (ref 135–145)
Total Bilirubin: 0.3 mg/dL (ref 0.3–1.2)
Total Protein: 6.2 g/dL — ABNORMAL LOW (ref 6.5–8.1)

## 2018-07-10 LAB — RAPID URINE DRUG SCREEN, HOSP PERFORMED
Amphetamines: NOT DETECTED
Barbiturates: POSITIVE — AB
Benzodiazepines: POSITIVE — AB
Cocaine: NOT DETECTED
Opiates: NOT DETECTED
Tetrahydrocannabinol: NOT DETECTED

## 2018-07-10 LAB — CBC
HEMATOCRIT: 35.2 % — AB (ref 36.0–46.0)
HEMOGLOBIN: 10.7 g/dL — AB (ref 12.0–15.0)
MCH: 28.8 pg (ref 26.0–34.0)
MCHC: 30.4 g/dL (ref 30.0–36.0)
MCV: 94.9 fL (ref 80.0–100.0)
Platelets: 287 10*3/uL (ref 150–400)
RBC: 3.71 MIL/uL — ABNORMAL LOW (ref 3.87–5.11)
RDW: 15.1 % (ref 11.5–15.5)
WBC: 5.7 10*3/uL (ref 4.0–10.5)
nRBC: 0 % (ref 0.0–0.2)

## 2018-07-10 LAB — GLUCOSE, CAPILLARY: Glucose-Capillary: 222 mg/dL — ABNORMAL HIGH (ref 70–99)

## 2018-07-10 MED ORDER — LEVETIRACETAM 500 MG PO TABS: 500.0000 mg | ORAL_TABLET | Freq: Two times a day (BID) | ORAL | 1 refills | Status: DC

## 2018-07-10 MED ORDER — METOPROLOL TARTRATE 25 MG PO TABS: 12.5000 mg | ORAL_TABLET | Freq: Two times a day (BID) | ORAL | 1 refills | Status: DC

## 2018-07-10 MED ORDER — LEVETIRACETAM 500 MG PO TABS
500.0000 mg | ORAL_TABLET | Freq: Two times a day (BID) | ORAL | Status: DC
  Administered 2018-07-10: 500 mg via ORAL
  Filled 2018-07-10: qty 1

## 2018-07-10 MED ORDER — METOPROLOL TARTRATE 25 MG PO TABS
12.5000 mg | ORAL_TABLET | Freq: Two times a day (BID) | ORAL | Status: DC
  Administered 2018-07-10: 12.5 mg via ORAL
  Filled 2018-07-10: qty 1

## 2018-07-10 NOTE — Telephone Encounter (Signed)
I placed Fri appt on hold per Dr. Lucia Gaskins.

## 2018-07-10 NOTE — Progress Notes (Signed)
Pt IV removed, WNL. D/C instructions given to pt. Verbalized understanding. Pt spouse at bedside to transport home. 

## 2018-07-10 NOTE — Telephone Encounter (Signed)
I have all of the information waiting to be mailed. Waiting on patient to bring CD. Patient is aware.

## 2018-07-11 NOTE — Telephone Encounter (Addendum)
Spoke with Dr. Lucia Gaskins. I called the patient on her mobile # and LVM (ok per DPR) informing pt that she just needs to bring any CDs that Duwayne Heck has already talked to her about so they can be sent to [facility] for review prior to her appt. Dr. Lucia Gaskins doesn't need any additional CDs for her. There is imaging she can see already in the chart. Left office number in case pt has any questions.

## 2018-07-11 NOTE — Telephone Encounter (Signed)
Pt is asking for a call from RN Toma Copier about Friday appointment(phone rep was unable to schedule) pt is also asking for a call re: Dr Lucia Gaskins calling in Oxy IR 10 mg because it helped her a lot in the hospital

## 2018-07-11 NOTE — Telephone Encounter (Signed)
She will bring CD's on Friday if she needs to.

## 2018-07-11 NOTE — Telephone Encounter (Signed)
I called the patient to follow up on her MRI CD's. She states that one of the nurse was supposed to call her back and let her know if the CT was good enough to use or if they absolutely needed to bring the MRI CD. Please advise.

## 2018-07-11 NOTE — Telephone Encounter (Signed)
Dr. Lucia Gaskins aware of pt's call. I spoke with the patient and advised her that Dr. Lucia Gaskins would not be providing any further opioids for her. She verbalized understanding. Her questions were answered. I scheduled her for this Fri 07/13/2018 @ 10:30 arrival 15-30 minutes prior. Pt stated her head was continuing to hurt. I advised if she is having changes, worsening or feels she needs to go to the ED then we advise that she go. Otherwise we will see her Friday in office. She verbalized understanding and appreciation.

## 2018-07-12 LAB — URINE CULTURE

## 2018-07-12 NOTE — Telephone Encounter (Signed)
I spoke with the patient. She has had multiple MRIs done since October and would like to clarify which ones she needs to bring for the [facility] referral. She also asked if she needs to get the CD of her CT that was done in [place] prior to her most recent [facility] admission. I told pt that I would clarify with Dr. Lucia Gaskins and call her back asap. Pt aware that she will need to bring the CDs tomorrow so they can be sent with the referral to [facility] She verbalized appreciation.

## 2018-07-12 NOTE — Telephone Encounter (Signed)
Spoke with Dr. Lucia Gaskins. Only most recent MRI brain, MR C-spine and CT head are needed for [facility] referral. I called patient and informed her of this. She will get them (MRI brain & c-spine from [facility] on 1/13 and CT head from [facility] on 1/20) and will bring them tomorrow to her appointment. Pt verbalized appreciation and her questions were answered. She will bring her most recent prescribed medications to her appt tomorrow for med rec.

## 2018-07-13 ENCOUNTER — Ambulatory Visit: Payer: BLUE CROSS/BLUE SHIELD | Admitting: Neurology

## 2018-07-13 ENCOUNTER — Encounter: Payer: Self-pay | Admitting: Neurology

## 2018-07-13 VITALS — BP 140/90 | HR 122 | Ht 61.0 in | Wt 343.0 lb

## 2018-07-13 DIAGNOSIS — G9782 Other postprocedural complications and disorders of nervous system: Secondary | ICD-10-CM

## 2018-07-13 DIAGNOSIS — R51 Headache with orthostatic component, not elsewhere classified: Secondary | ICD-10-CM

## 2018-07-13 DIAGNOSIS — G971 Other reaction to spinal and lumbar puncture: Secondary | ICD-10-CM | POA: Diagnosis not present

## 2018-07-13 DIAGNOSIS — G9600 Cerebrospinal fluid leak, unspecified: Secondary | ICD-10-CM

## 2018-07-13 DIAGNOSIS — R569 Unspecified convulsions: Secondary | ICD-10-CM | POA: Diagnosis not present

## 2018-07-13 DIAGNOSIS — B370 Candidal stomatitis: Secondary | ICD-10-CM | POA: Diagnosis not present

## 2018-07-13 NOTE — Progress Notes (Signed)
[facility]    Provider:  Dr Lucia Gaskins Referring Provider: No ref. provider found, [name] NP Primary Care Physician:  Alisia Ferrari, NP  CC Interval history 07/13/2018: Patient is here for follow up. She was recently admitted for seizure-like activity. :  Headache, blurry vision, balance issues, muscle jerks, dizzy all after Lumbr Surgery s/p csf leak and multiple admissions for repair with extensive hospitalization. He was at home and had tonic-clonic activity, urination and post-ictal period x2. And then also while in the ED. 5-6 days prior she stopped her Klonopin and Gabapentin. Urine drug screen +benzos and barbituates even though her drug registry does not show her getting Benzos since 11/2017. She was started on Keppra. She has had 2 more.  No driving. She has had 2 others since leaving the hospital. Reviewed video which appears non-epileptic, reviewed it with colleague, eyes closed, variable shaking of all limbs not tonic or clonix. She needs to see pcp for cardiac workup.   HPI:  Sonya Gibson is a 33 y.o. female here as requested by Dr. No ref. provider found for intractable headache. 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 PMHx PTSD, hypothyroidism, depression, anxiety, HTN, Diabetes, migraine, anxiety.  She has had headaches since spinal surgery (see above) started after her first surgery, these are not like her migraines at all.  She would get relief with laying down and worse standing, vision changes, all over the head, imbalance, dizziness and vertigo. It all started in the setting of csf leak and has contnued since then. She has no relief with migraine cocktail or even fioricet. The headache is continuous but some days worse than others. No migraine symptoms such as light or sound sensitivity, again not like previous headaches. The positional nature os becoming less noticeable but still better  with laying down and worse with standing. Her headaches are in the back of the head worse but all over, feels like a pressure and a throb, she can hear whooshing noise. Her left eye goes blurry. She has been seeing an ophthalmologist in October and she went to the ED. She has had multiple steroids infusions. She has a band of pressure. Better with laying down. No papilledema was noted in the eye but +RAPD however MRI Brain and c-spine did not show etiology.Prior to this headaches were not daily, maybe one day a month and rarely severe.   Reviewed notes, labs and imaging from outside physicians, which showed:  Reviewed inpatient records.  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  in July 2019 she was admitted repeatedly for pseudomeningocele and taken to the operating room multiple times for lumbar reexploration with repair of CSF leak and placement of lumbar drain.  She had a long and complicated hospital course.  The lumbar drain remained in place for 8 days and a CT myelogram suggested no evidence of leak.  The lumbar drain was removed.  She then once again started to leak spinal fluid from her wound.  She was taken back to the operating room for exploration and placement of a lumboperitoneal shunt which was not not placed but iIt was found that she had torn through her suture line, the dura was resutured, and a lumbar drain was placed.  On discharge Dr. Yetta Barre notes state she had no headache, she had appropriate soreness but no radicular pain.  She was taken  off vancomycin which was used prophylactically on the lumbar drain was in place.  Pain was well tolerated with oral pain medications.  Review of Systems: Patient complains of symptoms per HPI as well as the following symptoms: Headache, numbness, weakness, difficulty swallowing, dizziness, passing out, insomnia, sleepiness, snoring, depression, anxiety, too much  sleep, not enough sleep, decreased energy, blurred vision, loss of vision, fatigue, ringing in ears, spinning sensation, trouble swallowing, diarrhea, joint pain, aching muscles. Pertinent negatives and positives per HPI. All others negative.   Social History   Socioeconomic History  . Marital status: Married    Spouse name: Not on file  . Number of children: 3  . 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 Highest education level: Not on file  Occupational History  . Not on file  Social Needs  . Financial resource strain: Not on file  . Food insecurity:    Worry: Not on file    Inability: Not on file  . Transportation needs:    Medical: Not on file    Non-medical: Not on file  Tobacco Use  . Smoking status: Former Smoker    Packs/day: 2.00    Years: 3.00    Pack years: 6.00    Types: Cigarettes    Last attempt to quit: 2006    Years since quitting: 14.0  . Smokeless tobacco: Never Used  Substance and Sexual Activity  . Alcohol use: Yes    Comment: socially  . Drug use: No  . Sexual activity: Yes    Birth control/protection: None  Lifestyle  . Physical activity:    Days per week: Not on file    Minutes per session: Not on file  . Stress: Not on file  Relationships  . Social connections:    Talks on phone: Not on file    Gets together: Not on file    Attends religious service: Not on file    Active member of club or organization: Not on file    Attends meetings of clubs or organizations: Not on file    Relationship status: Not on file  . Intimate partner violence:    Fear of current or ex partner: Not on file    Emotionally abused: Not on file    Physically abused: Not on file    Forced sexual activity: Not on file  Other Topics Concern  . Not on file  Social History Narrative   Lives at home with husband and kids   Right handed   Caffeine: 6 cups daily    Family History  Problem Relation Age of Onset  . Diabetes Mellitus II Mother   . Hypertension Mother   .  Seizures Mother   . Other Mother        post stroke headaches  . Stroke Mother   . Hypertension Father   . Diabetes Mellitus II Father   . Hyperlipidemia Father   . Bipolar disorder Sister   . Asthma Son   . Autism Son   . Tourette syndrome Son   . Autism Daughter        level 3  . Other Daughter        tibia torsion & femoral aniversion    Past Medical History:  Diagnosis Date  . Anxiety   . Chronic back pain   . Depression   . Diabetes (HCC)   . Drug-seeking behavior   . Essential hypertension   . GERD (gastroesophageal reflux disease)   . Hypothyroidism   .  Lumbar radiculopathy, right   . Migraine headache   . PTSD (post-traumatic stress disorder)     Past Surgical History:  Procedure Laterality Date  . ANTERIOR CRUCIATE LIGAMENT REPAIR Left   . BACK SURGERY  12/05/2017  . CESAREAN SECTION    . IR EPIDUROGRAPHY  11/17/2017  . KNEE SURGERY    . LUMBAR LAMINECTOMY/DECOMPRESSION MICRODISCECTOMY Right 12/05/2017   Procedure: Right L4-5 Microdiskectomy;  Surgeon: Tia AlertJones, David S, MD;  [facility];  Service: Neurosurgery;  Laterality: Right;  . LUMBAR LAMINECTOMY/DECOMPRESSION MICRODISCECTOMY N/A 12/20/2017   Procedure: Repair of CSF Leak;  Surgeon: Tia AlertJones, David S, MD;  [facility];  Service: Neurosurgery;  Laterality: N/A;  . LUMBAR WOUND DEBRIDEMENT N/A 12/27/2017   Procedure: Re-exploration for CSF leak repair and placement of lumbar drain;  Surgeon: Tia AlertJones, David S, MD;  [facility];  Service: Neurosurgery;  Laterality: N/A;  . middle finger reatachment    . PLACEMENT OF LUMBAR DRAIN N/A 12/27/2017   Procedure: PLACEMENT OF LUMBAR DRAIN;  Surgeon: Tia AlertJones, David S, MD;  [facility];  Service: Neurosurgery;  Laterality: N/A;  . TONSILLECTOMY AND ADENOIDECTOMY  1996  . VENTRICULOPERITONEAL SHUNT N/A 01/08/2018   Procedure: Re-exploration and repair of previous Lumbar cerebro-spinal fluid leak and placement of Lumbar drain;  Surgeon: Tia AlertJones, David S, MD;  [facility];  Service: Neurosurgery;  Laterality: N/A;    Current Outpatient Medications  Medication Sig Dispense Refill  . acetaminophen (TYLENOL) 325 MG tablet Take 650 mg by mouth every 6 (six) hours as needed.    Marland Kitchen. albuterol (VENTOLIN HFA) 108 (90 Base) MCG/ACT inhaler Inhale 2 puffs into the lungs every 6 (six) hours as needed for wheezing or shortness of breath.    . Butalbital-APAP-Caffeine 50-300-40 MG CAPS Take 1 capsule by mouth every 4 (four) hours as needed (headache).   0  . calcium carbonate (TUMS EX) 750 MG chewable tablet Chew 2 tablets by mouth as needed for heartburn.    . cyanocobalamin (,VITAMIN B-12,) 1000 MCG/ML injection Inject 1,000 mcg into the muscle See admin instructions. Every two weeks  5  . cyclobenzaprine (FLEXERIL) 10 MG tablet Take 1 tablet (10 mg total) by mouth 3 (three) times daily as needed for muscle spasms. 30 tablet 0  . diphenhydrAMINE (BENADRYL) 50 MG capsule Take 50 mg by mouth every 6 (six) hours as needed.    Marland Kitchen. ibuprofen (ADVIL,MOTRIN) 200 MG tablet Take 400 mg by mouth every 6 (six) hours as needed.    . levETIRAcetam (KEPPRA) 500 MG tablet Take 1 tablet (500 mg total) by mouth 2 (two) times daily. 60 tablet 1  . Melatonin 10 MG TABS Take 10 mg by mouth daily as needed (sleep).    . metFORMIN (GLUCOPHAGE) 500 MG tablet Take 1 tablet by mouth 2 (two) times daily with a meal.     . metoprolol tartrate (LOPRESSOR) 25 MG tablet Take 0.5 tablets (12.5 mg total) by mouth 2 (two) times daily. 60 tablet 1  . Prenatal Vit-Fe Fumarate-FA (PRENATAL MULTIVITAMIN) TABS tablet Take 2 tablets by mouth Nightly.    . promethazine (PHENERGAN) 25 MG tablet Take 1 tablet (25 mg total) by mouth every 6 (six) hours as needed for nausea or vomiting. 60 tablet 2  . traZODone (DESYREL) 150 MG tablet Take 150 mg by mouth at bedtime as needed for sleep.    Marland Kitchen. venlafaxine XR (EFFEXOR-XR) 75 MG 24 hr capsule Take 75 mg by mouth at bedtime.   3  .  Vitamin D, Ergocalciferol, (DRISDOL)  1.25 MG (50000 UT) CAPS capsule Take 50,000 Units by mouth every 7 (seven) days.    Marland Kitchen. venlafaxine XR (EFFEXOR-XR) 150 MG 24 hr capsule Take 150 mg by mouth at bedtime.      No current facility-administered medications for this visit.     Allergies as of 07/13/2018 - Review Complete 07/13/2018  Allergen Reaction Noted  . Cinnamon Anaphylaxis 12/20/2017  . Coconut oil Anaphylaxis 08/01/2016  . Lovenox [enoxaparin] Other (See Comments) 12/20/2017  . Nitrofurantoin Hives 04/26/2010  . Other  03/26/2018    Vitals: BP 140/90   Pulse (!) 122   Ht 5\' 1"  (1.549 m)   Wt (!) 343 lb (155.6 kg)   LMP 06/29/2018   BMI 64.81 kg/m  Last Weight:  Wt Readings from Last 1 Encounters:  07/13/18 (!) 343 lb (155.6 kg)   Last Height:   Ht Readings from Last 1 Encounters:  07/13/18 5\' 1"  (1.549 m)   Physical exam: Exam: Gen: NAD, conversant, well nourised, obese, well groomed                     CV: RRR, no MRG. No Carotid Bruits. No peripheral edema, warm, nontender Eyes: Conjunctivae clear without exudates or hemorrhage  Neuro: Detailed Neurologic Exam  Speech:    Speech is normal; fluent and spontaneous with normal comprehension.  Cognition:    The patient is oriented to person, place, and time;     recent and remote memory intact;     language fluent;     normal attention, concentration,     fund of knowledge Cranial Nerves:    The pupils are equal, round, and reactive to light. The fundi are normal and spontaneous venous pulsations are present. Decreased left eye, central vision intact otherwise decreased. Extraocular movements are intact. Trigeminal sensation is intact and the muscles of mastication are normal. The face is symmetric. The palate elevates in the midline. Hearing intact. Voice is normal. Shoulder shrug is normal. The tongue has normal motion without fasciculations.   Coordination:    Normal finger to nose and heel to shin. Normal rapid alternating movements.    Gait:    Heel-toe and tandem gait are normal.   Motor Observation:    No asymmetry, no atrophy, and no involuntary movements noted. Tone:    Normal muscle tone.    Posture:    Posture is normal. normal erect    Strength: Right foot weakness dorsiflex mild weakness - chronic; otherwise strength is V/V in the upper and lower limbs.      Sensation: intact to LT     Reflex Exam:  DTR's:    Decreased right patellar otherwise brisk   Toes:    The toes are downgoing bilaterally.   Clonus:    2 beats Ajs   Assessment/Plan:  33 year old patient with positional headahce that started after csf leak and extended hospitalization. She continues to have positional headaches, does not appear to sound like a primary headache disorder. Secondary to low-pressure headache? MRI doe snot show signs of low-pressure headache. Unclear etiology. May need low-csf expert [facility]   - No evidence on MRI of low-pressure headache however her symptoms appear to have started at that time and continue to be positional  - TEMPORARY 1 week opioids per patent request(this is not a headache medication) but will provide temporarily until we can call [facility], nothing else is working per patient, I don;t think  this is a primary headache disorder, is very positional and started right after csf leak and extended hospitalization. She may need to have a repeat LP with opening pressure and a repeat CT Myelogram however last was negative (this can happen when there is a small leak). May need to send her to Darcella Cheshire who is a low-pressure headache expert at [facility] if we can get her in within a few weeks which is unlikely but we can try  - Entire periphery left eye decreased vision, this can happen in low-pressure headaches as well. No other causes on MRI brain. Follow up with Ophthalmology. No causes on MRI to show etiology.   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 Still needs thorough eval, cardiac workup and likely therapy.Patient is unable to drive, operate heavy machinery, perform activities at heights or participate in water activities until 6 months seizure free. Continue Keppra for now may help with headache.  - pcp for cardiac eval and ask about a 30-day holter monitor  - She was given ativan and klonopin at the hospital which is why +urine   To prevent or relieve headaches, try the following: Cool Compress. Lie down and place a cool compress on your head.  Avoid headache triggers. If certain foods or odors seem to have triggered your migraines in the past, avoid them. A headache diary might help you identify triggers.  Include physical activity in your daily routine. Try a daily walk or other moderate aerobic exercise.  Manage stress. Find healthy ways to cope with the stressors, such as delegating tasks on your to-do list.  Practice relaxation techniques. Try deep breathing, yoga, massage and visualization.  Eat regularly. Eating regularly scheduled meals and maintaining a healthy diet might help prevent headaches. Also, drink plenty of fluids.  Follow a regular sleep schedule. Sleep deprivation might contribute to headaches Consider biofeedback. With this mind-body technique, you learn to control certain bodily functions - such as muscle tension, heart rate and blood pressure - to prevent headaches or reduce headache pain.    Proceed to emergency room if you experience new or worsening symptoms or symptoms do not resolve, if you have new neurologic symptoms or if headache is severe, or for any concerning symptom.   Cc:Dr. Delane Ginger  A total of 45 minutes was spent face-to-face with this patient. Over half this time was spent on counseling patient on the  1. Postoperative CSF leak   2. Headache due to low cerebrospinal fluid pressure   3. Convulsions, unspecified convulsion type  (HCC)     diagnosis and different diagnostic and therapeutic options, counseling and coordination of care, risks ans benefits of management, compliance, or risk factor reduction and education.      Naomie Dean, MD  [facility] [address] [place], [place] [address]  Phone [phone] Fax [phone]

## 2018-07-13 NOTE — Patient Instructions (Signed)
72-hour eeg at home Primary care - discuss holter monitor or hatever pcp thinks is clinically warranted Continue keppra Referral to [facility]   Non-Epileptic Seizures, Adult A seizure can cause:  Involuntary movements, like falling or shaking.  Changes in awareness or consciousness.  Convulsions. These are episodes of uncontrollable, jerking movement caused by sudden, intense tightening (contraction) of the muscles. Epileptic seizures are caused by abnormal electrical activity in the brain. Non-epileptic seizures are different. They are not caused by abnormal electrical signals in your brain. These seizures may look like epileptic seizures, but they are not caused by epilepsy. There are two types of non-epileptic seizures:  Physiologic non-epileptic seizure. This type results from an underlying problem that causes a disruption in your brain's electrical activity.  Psychogenic non-epileptic seizure. This type results from emotional stress. These seizures are sometimes called pseudoseizures. What are the causes? Causes of physiologic non-epileptic seizures can include:  Sudden drop in blood pressure.  Low blood sugar (glucose).  Low levels of salt (sodium) in your blood.  Low levels of calcium in your blood.  Migraine.  Heart rhythm problems.  Sleep disorders, such as narcolepsy.  Movement disorders, such as Tourette syndrome.  Infection.  Certain medicines.  Drug and alcohol abuse.  Fever. Common causes of psychogenic non-epileptic seizures can include:  Stress.  Emotional trauma.  Sexual or physical abuse.  Major life events, such as divorce or death of a loved one.  Mental health disorders, including anxiety and depression. What are the signs or symptoms? Symptoms of a non-epileptic seizure can be similar to those of an epileptic seizure, which may include:  A change in attention or behavior (altered mental status).  Loss of consciousness or  fainting.  Convulsions with rhythmic jerking movements.  Drooling.  Rapid eye movements.  Grunting.  Loss of bladder control and bowel control.  Bitter taste in the mouth.  Tongue biting. Some people experience unusual sensations (aura) before having a seizure. These can include:  "Butterflies" in the stomach.  Abnormal smells or tastes.  A feeling of having had a new experience before (dj vu). After a non-epileptic seizure, you may have a headache or sore muscles or feel confused and sleepy. Non-epileptic seizures usually:  Do not cause physical injuries.  Start slowly.  Include crying or shrieking.  Last longer than 2 minutes.  Include pelvic thrusting. How is this diagnosed? Non-epileptic seizures may be diagnosed by:  Your medical history.  A physical exam.  Your symptoms. ? Your health care provider may want to talk with your friends or relatives who have seen you have a seizure. ? If possible, it is helpful if you write down your seizure activity, including what led up to the seizure, and share that information with your health care provider. You may also need to have tests to look for causes of physiologic non-epileptic seizures. These may include:  An electroencephalogram (EEG). This test measures electrical activity in your brain. If you have had a non-epileptic seizure, the results of your EEG will likely be normal.  Video EEG. This test takes place in the hospital over the course of 2-7 days. The test uses a video camera and an EEG to monitor your symptoms and the electrical activity in your brain.  Blood tests.  Lumbar puncture. This test involves pulling fluid from your spine to check for infection.  Electrocardiogram (ECG or EKG). This test checks for an abnormal heart rhythm.  CT scan. If your health care provider thinks you have had  a psychogenic non-epileptic seizure, you may need to see a mental health specialist for an evaluation. How is  this treated? The treatment for your seizures will depend on what is causing them. When the underlying condition is treated, your seizures should stop. If your seizures are being caused by emotional trauma or stress, your health care provider may recommend that you see a mental health professional. Treatment may include:  Relaxation therapy or cognitive behavioral therapy.  Medicines to treat depression or anxiety.  Individual or family counseling. In some cases, you may have psychogenic seizures in addition to epileptic seizures. If this is the case, you may be prescribed medicine to help with the epileptic seizures. Follow these instructions at home: Home care will depend on the type of non-epileptic seizures that you have. In general:  Follow all instructions from your health care provider. These may include ways to prevent seizures and what to do if you have a seizure.  Take over-the-counter and prescription medicines only as told by your health care provider.  Keep all follow-up visits as told by your health care provider. This is important.  Make sure family members, friends, and coworkers are trained on how to help you if you have a seizure. If you have a seizure, they should: ? Lay you on the ground to prevent a fall. ? Place a pillow or piece of clothing under your head. ? Loosen any clothing around your neck. ? Turn you onto your side. If vomiting occurs, this helps keep your airway clear.  Avoid any substances that may prevent your medicine from working properly. If you are prescribed medicine for seizures: ? Do not use recreational drugs. ? Limit or avoid alcoholic beverages. Contact a health care provider if:  Your seizures change or become more frequent.  You continue to have seizures after treatment. Get help right away if:  You injure yourself during a seizure.  You have one seizure after another.  You have trouble recovering from a seizure.  You have chest  pain or trouble breathing.  You have a seizure that lasts longer than 5 minutes. Summary  Non-epileptic seizures may look like epileptic seizures, but they are not caused by epilepsy.  The treatment for your seizures will depend on what is causing them. When the underlying condition is treated, your seizures should stop.  Make sure family members, friends, and coworkers are trained on how to help you if you have a seizure. If you have a seizure, they should lay you on the ground to prevent a fall, protect your head and neck, and turn you onto your side. This information is not intended to replace advice given to you by your health care provider. Make sure you discuss any questions you have with your health care provider. Document Released: 07/22/2005 Document Revised: 03/18/2016 Document Reviewed: 03/18/2016 Elsevier Interactive Patient Education  2019 [employer]

## 2018-07-14 DIAGNOSIS — B379 Candidiasis, unspecified: Secondary | ICD-10-CM | POA: Diagnosis not present

## 2018-07-14 DIAGNOSIS — T7840XA Allergy, unspecified, initial encounter: Secondary | ICD-10-CM | POA: Diagnosis not present

## 2018-07-14 DIAGNOSIS — E1165 Type 2 diabetes mellitus with hyperglycemia: Secondary | ICD-10-CM | POA: Diagnosis not present

## 2018-07-15 NOTE — Telephone Encounter (Signed)
I have the CD will give it to you Monday thanks

## 2018-07-17 NOTE — Telephone Encounter (Signed)
Noted, referral sent. DW

## 2018-07-24 LAB — GLUCOSE, CAPILLARY: Glucose-Capillary: 134 mg/dL — ABNORMAL HIGH (ref 70–99)

## 2018-07-26 DIAGNOSIS — K760 Fatty (change of) liver, not elsewhere classified: Secondary | ICD-10-CM | POA: Diagnosis not present

## 2018-07-26 DIAGNOSIS — R945 Abnormal results of liver function studies: Secondary | ICD-10-CM | POA: Diagnosis not present

## 2018-07-26 DIAGNOSIS — K529 Noninfective gastroenteritis and colitis, unspecified: Secondary | ICD-10-CM | POA: Diagnosis not present

## 2018-07-26 DIAGNOSIS — R1013 Epigastric pain: Secondary | ICD-10-CM | POA: Diagnosis not present

## 2018-08-22 ENCOUNTER — Encounter: Payer: Self-pay | Admitting: *Deleted

## 2018-08-22 ENCOUNTER — Telehealth: Payer: Self-pay | Admitting: *Deleted

## 2018-08-22 NOTE — Telephone Encounter (Signed)
Received word from Rusty @ [facility] that they have tried to reach pt 10 times to schedule her ambulatory EEG. I called pt & LVM (ok per DPR) informing pt of this and left their number for her to please call so they can schedule the EEG [phone]). Also sent pt a mychart message.

## 2018-09-06 ENCOUNTER — Encounter: Payer: Self-pay | Admitting: *Deleted

## 2018-09-14 DIAGNOSIS — F419 Anxiety disorder, unspecified: Secondary | ICD-10-CM | POA: Diagnosis not present

## 2018-09-30 IMAGING — RF DG MYELOGRAPHY LUMBAR INJ LUMBOSACRAL
13 of 18 series · 13 of 18 positions shown · non-contrast
Comparison: Lumbar MRI lumbar spine 12/20/2017. Myelogram
12/26/2017.

CLINICAL DATA: CSF leak, continued assessment.
TECHNIQUE: Contiguous axial images were obtained through the Lumbar spine after
the intrathecal infusion of infusion. Coronal and sagittal
reconstructions were obtained of the axial image sets.

[Series 9: fluoro_myelogram_singleshot_bw · 0.17mm/px · 1 of 1 slices shown (1 of 13)]
[im 1/1]
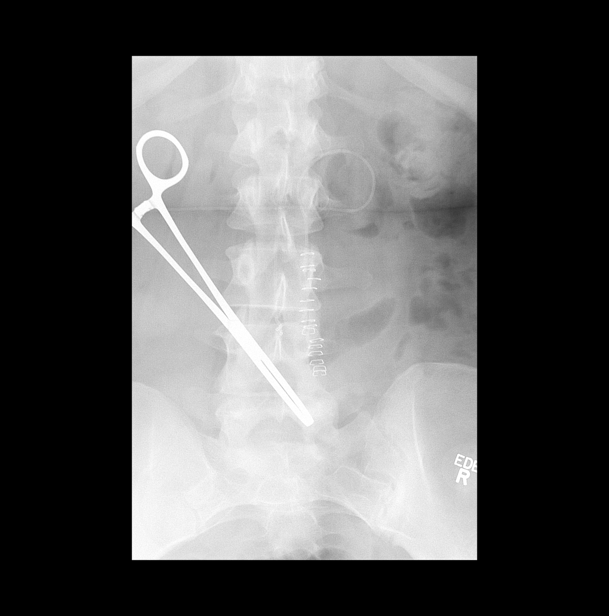

[Series 11: fluoro_myelogram_singleshot_bw · 0.17mm/px · 1 of 1 slices shown (2 of 13)]
[im 1/1]
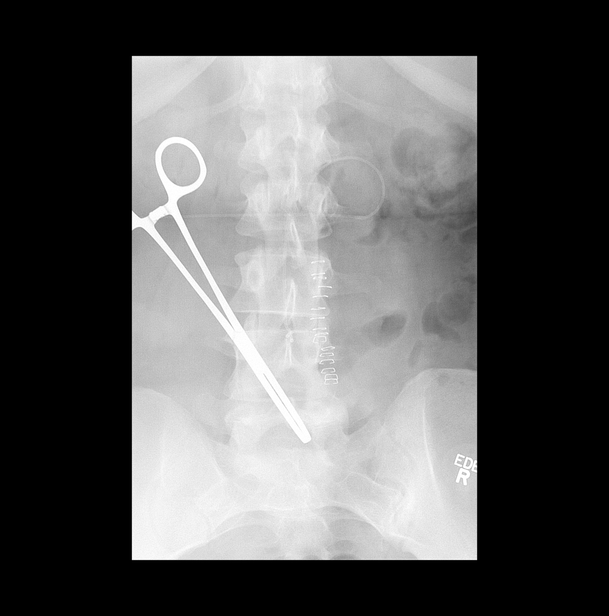

[Series 12: fluoro_myelogram_singleshot_bw · 0.17mm/px · 1 of 1 slices shown (3 of 13)]
[im 1/1]
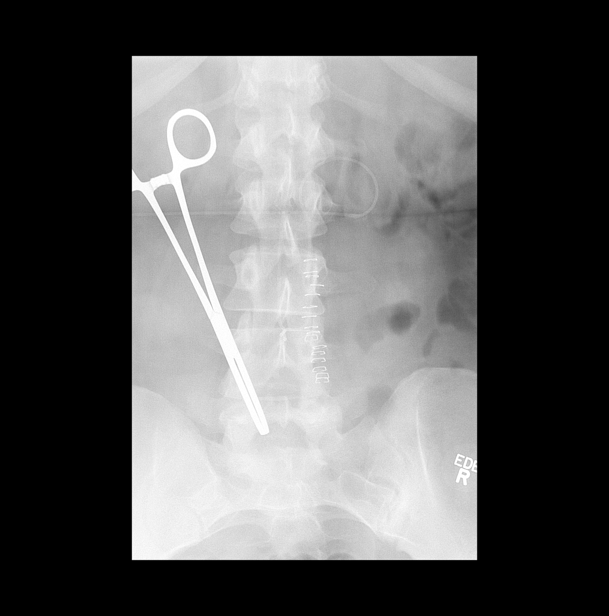

[Series 13: fluoro_myelogram_singleshot_bw · 0.17mm/px · 1 of 1 slices shown (4 of 13)]
[im 1/1]
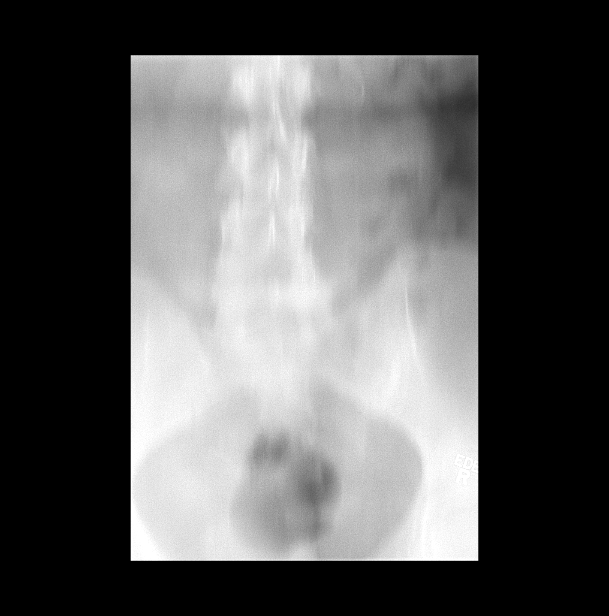

[Series 15: fluoro_myelogram_singleshot_bw · 0.17mm/px · 1 of 1 slices shown (5 of 13)]
[im 1/1]
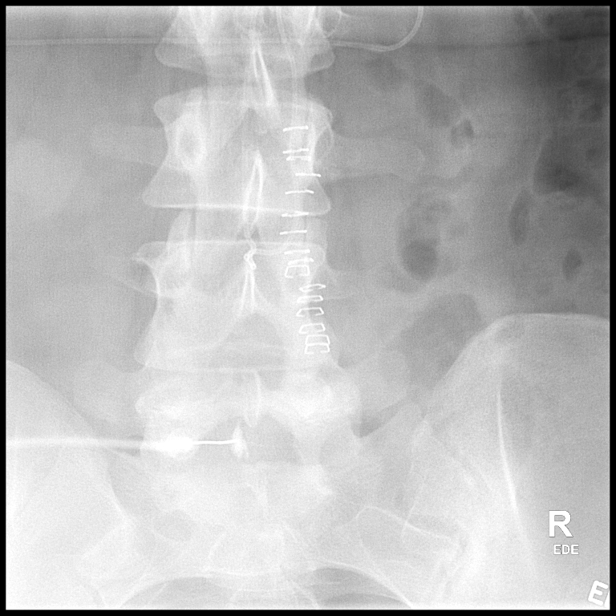

[Series 16: fluoro_myelogram_singleshot_bw · 0.17mm/px · 1 of 1 slices shown (6 of 13)]
[im 1/1]
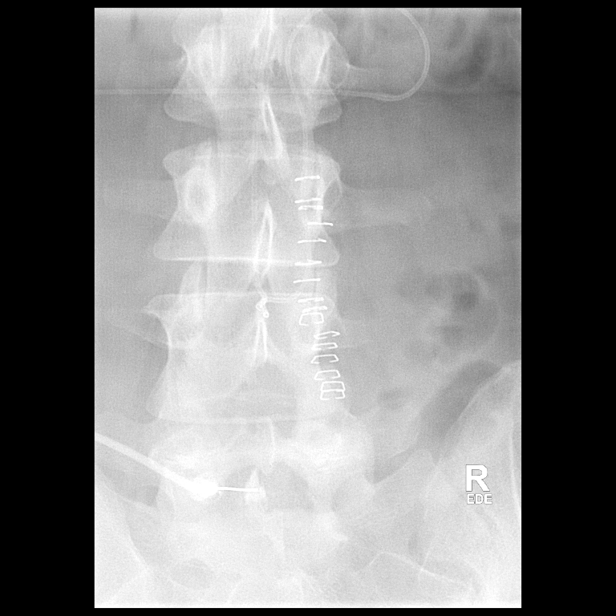

[Series 18: fluoro_myelogram_singleshot_bw · 0.17mm/px · 1 of 1 slices shown (7 of 13)]
[im 1/1]
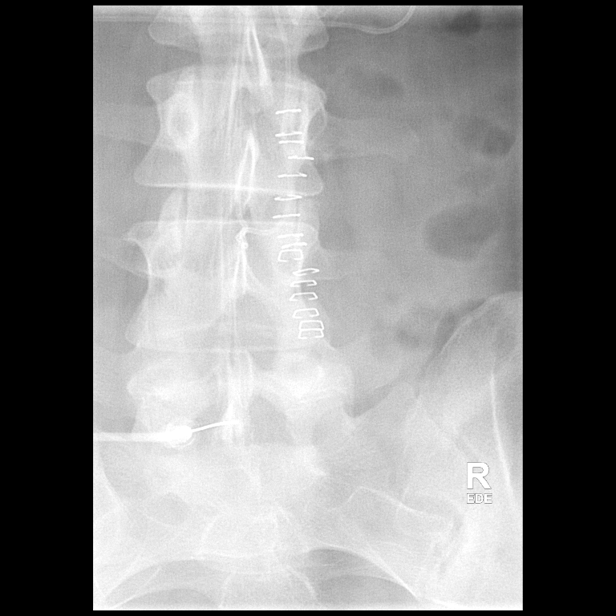

[Series 19: fluoro_myelogram_singleshot_bw · 0.17mm/px · 1 of 1 slices shown (8 of 13)]
[im 1/1]
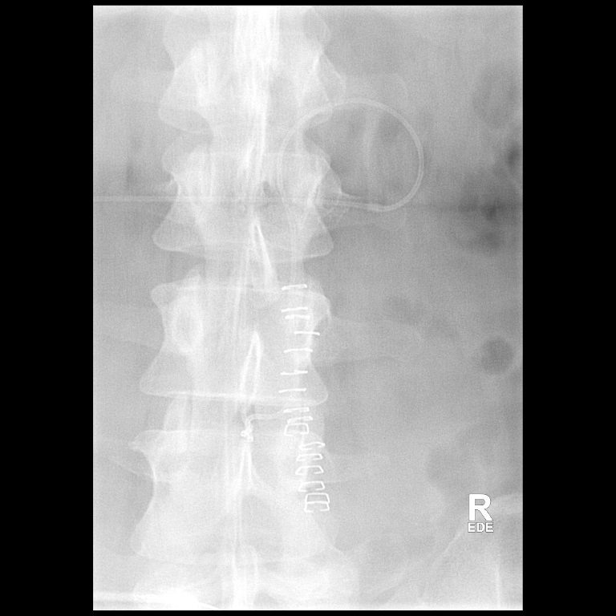

[Series 20: fluoro_myelogram_singleshot_bw · 0.17mm/px · 1 of 1 slices shown (9 of 13)]
[im 1/1]
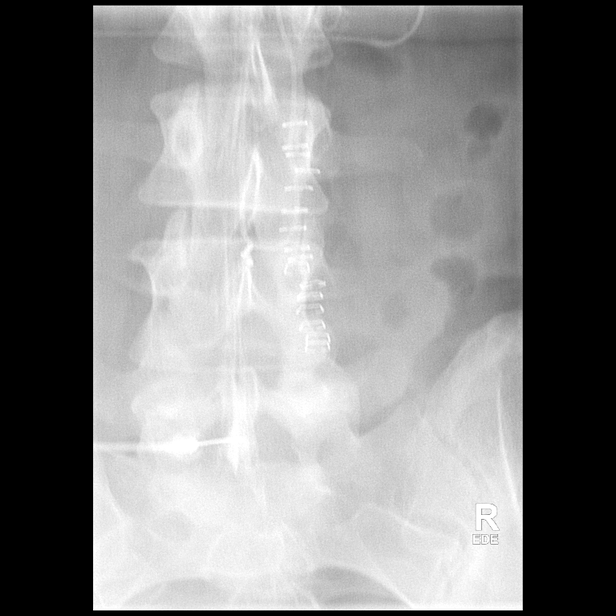

[Series 22: fluoro_myelogram_singleshot_bw · 0.17mm/px · 1 of 1 slices shown (10 of 13)]
[im 1/1]
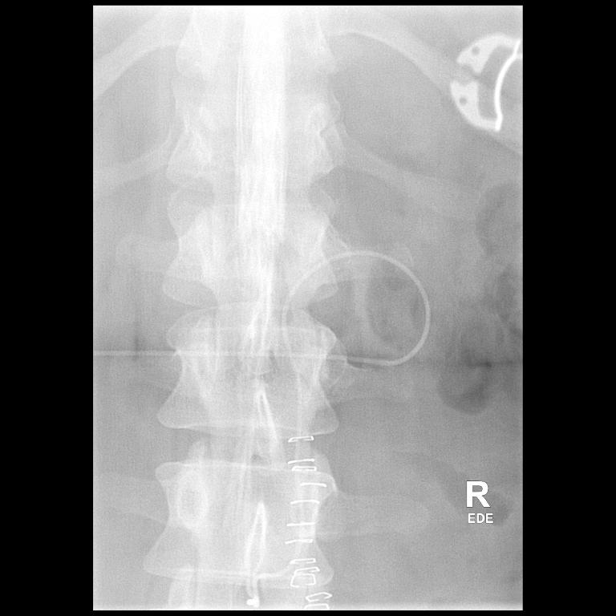

[Series 23: fluoro_myelogram_singleshot_bw · 0.17mm/px · 1 of 1 slices shown (11 of 13)]
[im 1/1]
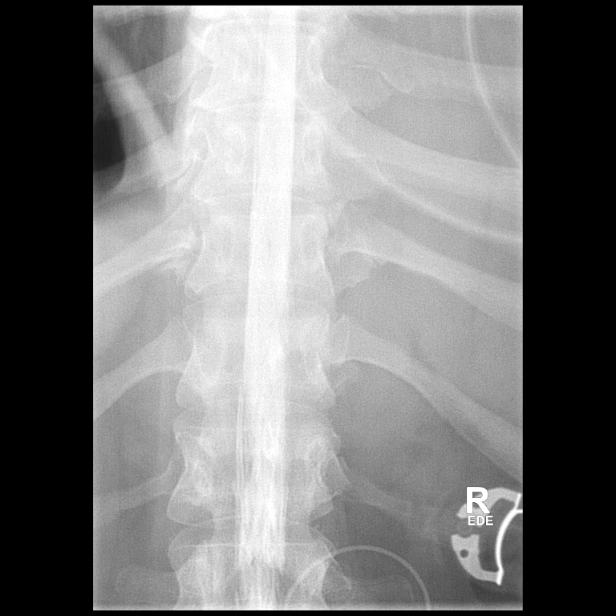

[Series 24: fluoro_myelogram_singleshot_bw · 0.18mm/px · 1 of 1 slices shown (12 of 13)]
[im 1/1]
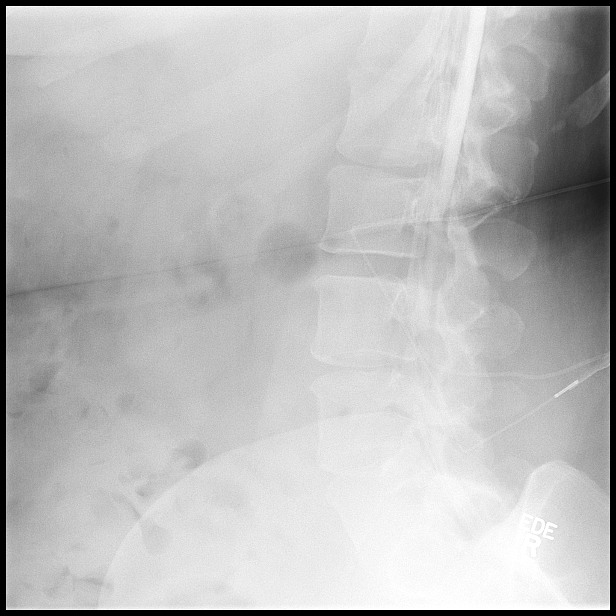

[Series 26: fluoro_myelogram_singleshot_bw · 0.18mm/px · 1 of 1 slices shown (13 of 13)]
[im 1/1]
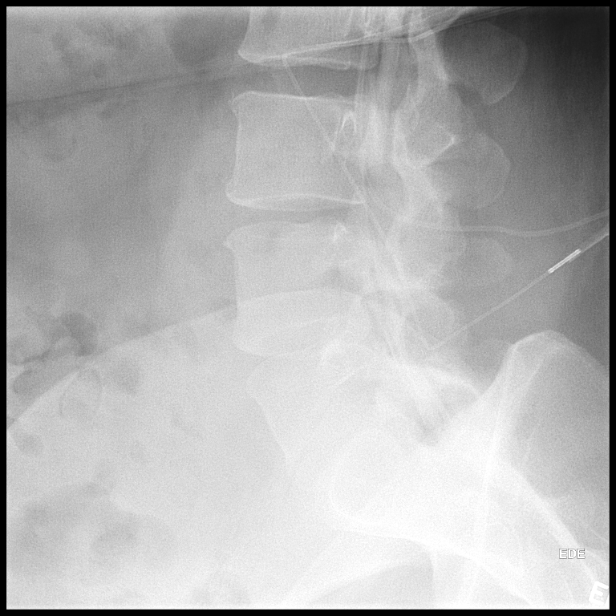

[13 of 18 positions shown; findings below may reference images not displayed]

EXAM:
LUMBAR MYELOGRAM

FLUOROSCOPY TIME:  1 minutes and 1 second corresponding

PROCEDURE:
After thorough discussion of risks and benefits of the procedure
including bleeding, infection, injury to nerves, blood vessels,
adjacent structures as well as headache and CSF leak, written and
oral informed consent was obtained. Consent was obtained by Dr. Bot
Pius. Time out form was completed.

Patient was positioned prone on the fluoroscopy table. Local
anesthesia was provided with 1% lidocaine without epinephrine after
prepped and draped in the usual sterile fashion. Puncture was
performed at L5-S1 using a 5 inch 22-gauge spinal needle via midline
approach. Using a single pass through the dura, the needle was
placed within the thecal sac, with return of clear CSF. 15 mL of
Isovue R-699 was injected into the thecal sac, with normal
opacification of the nerve roots and cauda equina consistent with
free flow within the subarachnoid space.

I personally performed the lumbar puncture and administered the
intrathecal contrast. I also personally supervised acquisition of
the myelogram images.
FINDINGS: LUMBAR MYELOGRAM FINDINGS:

Good opacification lumbar subarachnoid space. The subarachnoid
spaces attenuated over the lower lumbar segments, likely
postoperative in nature. No CSF leak is identified either at the
site of repair or at the site of the lumbar drain entry. Patient was
examined with intermittent fluoroscopy over several minutes in the
lateral position, and no CSF leak is observed.

CT LUMBAR MYELOGRAM FINDINGS:

The patient was taken to CT scan, after supine positioning, to allow
for potential egress of CSF if a leak was persistent.

There was no CSF leak identified, either at the site of repair, or
at the site of lumbar drain entry. There is mild circumferential
attenuation of the thecal sac, attributed to epidural lipomatosis,
possible subdural CSF, and short pedicles. There is confirmation of
subarachnoid contrast, and patency of the lumbar drain established,
with contrast in the tubing.

Dorsal subcutaneous lower lumbar fluid collection is redemonstrated,
estimated 2 x 4 cm, likely seroma.
IMPRESSION: LUMBAR MYELOGRAM IMPRESSION:

No CSF leak is identified at the time of myelography, with
particular attention to the area of surgical repair using lateral
fluoroscopy.

CT LUMBAR MYELOGRAM IMPRESSION:

No CSF leak is identified, either at the surgical repair site or at
the lumbar drain entry site.

Findings discussed with ordering provider.

## 2018-10-24 DIAGNOSIS — M549 Dorsalgia, unspecified: Secondary | ICD-10-CM | POA: Diagnosis not present

## 2018-10-24 DIAGNOSIS — E119 Type 2 diabetes mellitus without complications: Secondary | ICD-10-CM | POA: Diagnosis not present

## 2018-10-24 DIAGNOSIS — G4700 Insomnia, unspecified: Secondary | ICD-10-CM | POA: Diagnosis not present

## 2018-10-24 DIAGNOSIS — G8929 Other chronic pain: Secondary | ICD-10-CM | POA: Diagnosis not present

## 2018-11-26 DIAGNOSIS — I10 Essential (primary) hypertension: Secondary | ICD-10-CM | POA: Diagnosis not present

## 2018-11-26 DIAGNOSIS — Z13220 Encounter for screening for lipoid disorders: Secondary | ICD-10-CM | POA: Diagnosis not present

## 2018-11-26 DIAGNOSIS — E119 Type 2 diabetes mellitus without complications: Secondary | ICD-10-CM | POA: Diagnosis not present

## 2018-11-26 DIAGNOSIS — E039 Hypothyroidism, unspecified: Secondary | ICD-10-CM | POA: Diagnosis not present

## 2018-12-04 DIAGNOSIS — E039 Hypothyroidism, unspecified: Secondary | ICD-10-CM | POA: Diagnosis not present

## 2018-12-04 DIAGNOSIS — G4700 Insomnia, unspecified: Secondary | ICD-10-CM | POA: Diagnosis not present

## 2018-12-04 DIAGNOSIS — E119 Type 2 diabetes mellitus without complications: Secondary | ICD-10-CM | POA: Diagnosis not present

## 2018-12-04 DIAGNOSIS — I10 Essential (primary) hypertension: Secondary | ICD-10-CM | POA: Diagnosis not present

## 2018-12-17 DIAGNOSIS — R197 Diarrhea, unspecified: Secondary | ICD-10-CM | POA: Diagnosis not present

## 2018-12-17 DIAGNOSIS — R05 Cough: Secondary | ICD-10-CM | POA: Diagnosis not present

## 2018-12-17 DIAGNOSIS — R5383 Other fatigue: Secondary | ICD-10-CM | POA: Diagnosis not present

## 2018-12-17 DIAGNOSIS — R112 Nausea with vomiting, unspecified: Secondary | ICD-10-CM | POA: Diagnosis not present

## 2018-12-19 DIAGNOSIS — Z20828 Contact with and (suspected) exposure to other viral communicable diseases: Secondary | ICD-10-CM | POA: Diagnosis not present

## 2018-12-19 DIAGNOSIS — G44209 Tension-type headache, unspecified, not intractable: Secondary | ICD-10-CM | POA: Diagnosis not present

## 2018-12-19 DIAGNOSIS — R05 Cough: Secondary | ICD-10-CM | POA: Diagnosis not present

## 2018-12-20 DIAGNOSIS — R0602 Shortness of breath: Secondary | ICD-10-CM | POA: Insufficient documentation

## 2018-12-20 DIAGNOSIS — B9689 Other specified bacterial agents as the cause of diseases classified elsewhere: Secondary | ICD-10-CM | POA: Insufficient documentation

## 2018-12-20 DIAGNOSIS — R52 Pain, unspecified: Secondary | ICD-10-CM | POA: Diagnosis not present

## 2018-12-20 DIAGNOSIS — R51 Headache: Secondary | ICD-10-CM | POA: Diagnosis not present

## 2018-12-20 DIAGNOSIS — R509 Fever, unspecified: Secondary | ICD-10-CM | POA: Insufficient documentation

## 2018-12-20 DIAGNOSIS — R05 Cough: Secondary | ICD-10-CM | POA: Diagnosis not present

## 2018-12-20 DIAGNOSIS — J988 Other specified respiratory disorders: Secondary | ICD-10-CM | POA: Diagnosis not present

## 2019-01-02 ENCOUNTER — Encounter (HOSPITAL_COMMUNITY): Payer: Self-pay | Admitting: Emergency Medicine

## 2019-01-02 ENCOUNTER — Other Ambulatory Visit: Payer: Self-pay

## 2019-01-02 ENCOUNTER — Emergency Department (HOSPITAL_COMMUNITY)
Admission: EM | Admit: 2019-01-02 | Discharge: 2019-01-03 | Disposition: A | Discharge status: Home / Self Care | Payer: BC Managed Care – PPO | Attending: Emergency Medicine | Admitting: Emergency Medicine

## 2019-01-02 DIAGNOSIS — I10 Essential (primary) hypertension: Secondary | ICD-10-CM | POA: Insufficient documentation

## 2019-01-02 DIAGNOSIS — M5416 Radiculopathy, lumbar region: Secondary | ICD-10-CM | POA: Diagnosis not present

## 2019-01-02 DIAGNOSIS — Z79899 Other long term (current) drug therapy: Secondary | ICD-10-CM | POA: Insufficient documentation

## 2019-01-02 DIAGNOSIS — M545 Low back pain: Secondary | ICD-10-CM | POA: Diagnosis not present

## 2019-01-02 DIAGNOSIS — E119 Type 2 diabetes mellitus without complications: Secondary | ICD-10-CM | POA: Diagnosis not present

## 2019-01-02 DIAGNOSIS — Z87891 Personal history of nicotine dependence: Secondary | ICD-10-CM | POA: Diagnosis not present

## 2019-01-02 MED ORDER — ONDANSETRON HCL 4 MG/2ML IJ SOLN
4.0000 mg | Freq: Once | INTRAMUSCULAR | Status: AC
  Administered 2019-01-03: 4 mg via INTRAVENOUS
  Filled 2019-01-02: qty 2

## 2019-01-02 MED ORDER — HYDROMORPHONE HCL 1 MG/ML IJ SOLN
1.0000 mg | Freq: Once | INTRAMUSCULAR | Status: AC
  Administered 2019-01-03: 1 mg via INTRAVENOUS
  Filled 2019-01-02: qty 1

## 2019-01-02 MED ORDER — KETOROLAC TROMETHAMINE 30 MG/ML IJ SOLN
30.0000 mg | Freq: Once | INTRAMUSCULAR | Status: AC
  Administered 2019-01-03: 30 mg via INTRAVENOUS
  Filled 2019-01-02: qty 1

## 2019-01-02 MED ORDER — DEXAMETHASONE SODIUM PHOSPHATE 10 MG/ML IJ SOLN
10.0000 mg | Freq: Once | INTRAMUSCULAR | Status: AC
  Administered 2019-01-03: 10 mg via INTRAVENOUS
  Filled 2019-01-02: qty 1

## 2019-01-02 NOTE — ED Notes (Addendum)
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 Pain radiates into right side. Pt states her right groin to foot is numb.

## 2019-01-02 NOTE — ED Triage Notes (Signed)
Patient has a history of spinal problems and spinal surgery. Patient states pain and numbness in her right leg and lower back. Patient also complaining of headache. Patient states symptoms started on Sunday. Patient having no other deficits.

## 2019-01-03 MED ORDER — HYDROCODONE-ACETAMINOPHEN 5-325 MG PO TABS: 1.0000 | ORAL_TABLET | ORAL | 0 refills | Status: DC | PRN

## 2019-01-03 MED ORDER — DICLOFENAC POTASSIUM 50 MG PO TABS: 50.0000 mg | ORAL_TABLET | Freq: Three times a day (TID) | ORAL | 0 refills | Status: DC

## 2019-01-03 MED ORDER — METHOCARBAMOL 500 MG PO TABS: 500.0000 mg | ORAL_TABLET | Freq: Three times a day (TID) | ORAL | 0 refills | Status: DC | PRN

## 2019-01-03 MED ORDER — PREDNISONE 20 MG PO TABS: ORAL_TABLET | ORAL | 0 refills | Status: DC

## 2019-01-03 MED ORDER — HYDROMORPHONE HCL 1 MG/ML IJ SOLN
1.0000 mg | Freq: Once | INTRAMUSCULAR | Status: AC
  Administered 2019-01-03: 1 mg via INTRAVENOUS
  Filled 2019-01-03: qty 1

## 2019-01-03 NOTE — ED Provider Notes (Signed)
[facility]ERGENCY DEPARTMENT Provider Note   CSN: [id number] Arrival date & time: 01/02/19  1740    History   Chief Complaint Chief Complaint  Patient presents with  . Leg Pain    right foot    HPI Rusty AusHolly E Megna is a 33 y.o. female.     Patient presents to the emergency department for evaluation of back pain that started 3 days ago.  Patient reports persistent pain in the right lower back that radiates to the right hip and groin.  Patient reports a previous history of lumbar surgery.  Prior to her surgery she had similar symptoms.  She has started to feel some numbness of the right leg but no weakness.  She has not had any change in bowel or bladder function.  Patient denies any injury.     Past Medical History:  Diagnosis Date  . Anxiety   . Chronic back pain   . Depression   . Diabetes (HCC)   . Drug-seeking behavior   . Essential hypertension   . GERD (gastroesophageal reflux disease)   . Hypothyroidism   . Lumbar radiculopathy, right   . Migraine headache   . PTSD (post-traumatic stress disorder)     Patient Active Problem List   Diagnosis Date Noted  . Morbid obesity with BMI of 60.0-69.9, adult (HCC) 07/10/2018  . Headache due to low cerebrospinal fluid pressure 07/09/2018  . Postoperative CSF leak 07/09/2018  . 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 DM (diabetes mellitus) (HCC) 07/08/2018  . Headache 07/02/2018  . Vaginal bleeding 07/02/2018  . CSF leak 12/20/2017  . Pressure injury of skin 12/20/2017  . Lumbar radiculopathy   . S/P lumbar laminectomy 12/05/2017  . Perineal numbness 11/30/2017  . Weakness of right lower extremity 11/17/2017  . Lower back pain 11/15/2017  . Hypothyroidism   . Anxiety   . Essential hypertension   . PTSD (post-traumatic stress disorder)   . Decreased sensation of leg   . GESTATIONAL DIABETES 04/26/2010    Past Surgical History:  Procedure Laterality Date  . ANTERIOR CRUCIATE LIGAMENT REPAIR Left   .  BACK SURGERY  12/05/2017  . CESAREAN SECTION    . IR EPIDUROGRAPHY  11/17/2017  . KNEE SURGERY    . LUMBAR LAMINECTOMY/DECOMPRESSION MICRODISCECTOMY Right 12/05/2017   Procedure: Right L4-5 Microdiskectomy;  Surgeon: Tia AlertJones, David S, MD;  [facility];  Service: Neurosurgery;  Laterality: Right;  . LUMBAR LAMINECTOMY/DECOMPRESSION MICRODISCECTOMY N/A 12/20/2017   Procedure: Repair of CSF Leak;  Surgeon: Tia AlertJones, David S, MD;  [facility];  Service: Neurosurgery;  Laterality: N/A;  . LUMBAR WOUND DEBRIDEMENT N/A 12/27/2017   Procedure: Re-exploration for CSF leak repair and placement of lumbar drain;  Surgeon: Tia AlertJones, David S, MD;  [facility];  Service: Neurosurgery;  Laterality: N/A;  . middle finger reatachment    . PLACEMENT OF LUMBAR DRAIN N/A 12/27/2017   Procedure: PLACEMENT OF LUMBAR DRAIN;  Surgeon: Tia AlertJones, David S, MD;  [facility];  Service: Neurosurgery;  Laterality: N/A;  . TONSILLECTOMY AND ADENOIDECTOMY  1996  . VENTRICULOPERITONEAL SHUNT N/A 01/08/2018   Procedure: Re-exploration and repair of previous Lumbar cerebro-spinal fluid leak and placement of Lumbar drain;  Surgeon: Tia AlertJones, David S, MD;  [facility];  Service: Neurosurgery;  Laterality: N/A;     OB History    Gravida  1   Para      Term      Preterm      AB  Living        SAB      TAB      Ectopic      Multiple      Live Births               Home Medications    Prior to Admission medications   Medication Sig Start Date End Date Taking? Authorizing Provider  acetaminophen (TYLENOL) 325 MG tablet Take 650 mg by mouth every 6 (six) hours as needed.    [provider]  albuterol (VENTOLIN HFA) 108 (90 Base) MCG/ACT inhaler Inhale 2 puffs into the lungs every 6 (six) hours as needed for wheezing or shortness of breath.    [provider]  Butalbital-APAP-Caffeine 50-300-40 MG CAPS Take 1 capsule by mouth every 4 (four) hours as needed (headache).  12/19/17   [provider]  calcium carbonate (TUMS EX) 750 MG chewable tablet Chew 2 tablets by mouth as needed for heartburn.    [provider]  cyanocobalamin (,VITAMIN B-12,) 1000 MCG/ML injection Inject 1,000 mcg into the muscle See admin instructions. Every two weeks 02/16/18   [provider]  cyclobenzaprine (FLEXERIL) 10 MG tablet Take 1 tablet (10 mg total) by mouth 3 (three) times daily as needed for muscle spasms. 12/06/17   Bonnielee Haff, MD  diclofenac (CATAFLAM) 50 MG tablet Take 1 tablet (50 mg total) by mouth 3 (three) times daily. 01/03/19   Orpah Greek, MD  diphenhydrAMINE (BENADRYL) 50 MG capsule Take 50 mg by mouth every 6 (six) hours as needed.    [provider]  HYDROcodone-acetaminophen (NORCO/VICODIN) 5-325 MG tablet Take 1-2 tablets by mouth every 4 (four) hours as needed for moderate pain. 01/03/19   Orpah Greek, MD  HYDROcodone-acetaminophen (NORCO/VICODIN) 5-325 MG tablet Take 1-2 tablets by mouth every 4 (four) hours as needed. 01/03/19   Orpah Greek, MD  ibuprofen (ADVIL,MOTRIN) 200 MG tablet Take 400 mg by mouth every 6 (six) hours as needed.    [provider]  levETIRAcetam (KEPPRA) 500 MG tablet Take 1 tablet (500 mg total) by mouth 2 (two) times daily. 07/10/18   Orson Eva, MD  Melatonin 10 MG TABS Take 10 mg by mouth daily as needed (sleep).    [provider]  metFORMIN (GLUCOPHAGE) 500 MG tablet Take 1 tablet by mouth 2 (two) times daily with a meal.  09/25/17 09/20/18  [provider]  methocarbamol (ROBAXIN) 500 MG tablet Take 1 tablet (500 mg total) by mouth every 8 (eight) hours as needed for muscle spasms. 01/03/19   Orpah Greek, MD  metoprolol tartrate (LOPRESSOR) 25 MG tablet Take 0.5 tablets (12.5 mg total) by mouth 2 (two) times daily. 07/10/18   Orson Eva, MD  predniSONE (DELTASONE) 20 MG tablet 3 tabs po daily x 3 days, then 2 tabs x 3 days, then 1.5 tabs x 3 days, then 1  tab x 3 days, then 0.5 tabs x 3 days 01/03/19   Orpah Greek, MD  Prenatal Vit-Fe Fumarate-FA (PRENATAL MULTIVITAMIN) TABS tablet Take 2 tablets by mouth Nightly.    [provider]  promethazine (PHENERGAN) 25 MG tablet Take 1 tablet (25 mg total) by mouth every 6 (six) hours as needed for nausea or vomiting. 07/06/18   Melvenia Beam, MD  traZODone (DESYREL) 150 MG tablet Take 150 mg by mouth at bedtime as needed for sleep.    [provider]  venlafaxine XR (EFFEXOR-XR) 150 MG 24 hr  capsule Take 150 mg by mouth at bedtime.  06/06/17 07/08/18  [provider]  venlafaxine XR (EFFEXOR-XR) 75 MG 24 hr capsule Take 75 mg by mouth at bedtime.  12/12/17   [provider]  Vitamin D, Ergocalciferol, (DRISDOL) 1.25 MG (50000 UT) CAPS capsule Take 50,000 Units by mouth every 7 (seven) days.    [provider]    Family History Family History  Problem Relation Age of Onset  . Diabetes Mellitus II Mother   . Hypertension Mother   . Seizures Mother   . Other Mother        post stroke headaches  . Stroke Mother   . Hypertension Father   . Diabetes Mellitus II Father   . Hyperlipidemia Father   . Bipolar disorder Sister   . Asthma Son   . Autism Son   . Tourette syndrome Son   . Autism Daughter        level 3  . Other Daughter        tibia torsion & femoral aniversion    Social History Social History   Tobacco Use  . Smoking status: Former Smoker    Packs/day: 2.00    Years: 3.00    Pack years: 6.00    Types: Cigarettes    Quit date: 2006    Years since quitting: 14.5  . Smokeless tobacco: Never Used  Substance Use Topics  . Alcohol use: Yes    Comment: socially  . Drug use: No     Allergies   Cinnamon, Coconut oil, Lovenox [enoxaparin], Nitrofurantoin, and Other   Review of Systems Review of Systems  Musculoskeletal: Positive for back pain.  Neurological: Positive for numbness. Negative for weakness.  All other  systems reviewed and are negative.    Physical Exam Updated Vital Signs BP 102/72   Pulse (!) 108   Temp 98.5 F (36.9 C) (Oral)   Resp 18   Ht 5\' 1"  (1.549 m)   Wt (!) 140.6 kg   LMP 12/19/2018 (Approximate)   SpO2 99%   BMI 58.57 kg/m   Physical Exam Vitals signs and nursing note reviewed.  Constitutional:      General: She is not in acute distress.    Appearance: Normal appearance. She is well-developed.  HENT:     Head: Normocephalic and atraumatic.     Right Ear: Hearing normal.     Left Ear: Hearing normal.     Nose: Nose normal.  Eyes:     Conjunctiva/sclera: Conjunctivae normal.     Pupils: Pupils are equal, round, and reactive to light.  Neck:     Musculoskeletal: Normal range of motion and neck supple.  Cardiovascular:     Rate and Rhythm: Regular rhythm.     Heart sounds: S1 normal and S2 normal. No murmur. No friction rub. No gallop.   Pulmonary:     Effort: Pulmonary effort is normal. No respiratory distress.     Breath sounds: Normal breath sounds.  Chest:     Chest wall: No tenderness.  Abdominal:     General: Bowel sounds are normal.     Palpations: Abdomen is soft.     Tenderness: There is no abdominal tenderness. There is no guarding or rebound. Negative signs include Murphy's sign and McBurney's sign.     Hernia: No hernia is present.  Musculoskeletal: Normal range of motion.     Lumbar back: She exhibits tenderness.       Back:  Skin:  General: Skin is warm and dry.     Findings: No rash.  Neurological:     Mental Status: She is alert and oriented to person, place, and time.     GCS: GCS eye subscore is 4. GCS verbal subscore is 5. GCS motor subscore is 6.     Cranial Nerves: No cranial nerve deficit.     Sensory: No sensory deficit.     Coordination: Coordination normal.  Psychiatric:        Speech: Speech normal.        Behavior: Behavior normal.        Thought Content: Thought content normal.      ED Treatments / Results   Labs (all labs ordered are listed, but only abnormal results are displayed) Labs Reviewed - No data to display  EKG None  Radiology No results found.  Procedures Procedures (including critical care time)  Medications Ordered in ED Medications  HYDROmorphone (DILAUDID) injection 1 mg (1 mg Intravenous Given 01/03/19 0021)  ondansetron (ZOFRAN) injection 4 mg (4 mg Intravenous Given 01/03/19 0018)  ketorolac (TORADOL) 30 MG/ML injection 30 mg (30 mg Intravenous Given 01/03/19 0019)  dexamethasone (DECADRON) injection 10 mg (10 mg Intravenous Given 01/03/19 0019)  HYDROmorphone (DILAUDID) injection 1 mg (1 mg Intravenous Given 01/03/19 0147)     Initial Impression / Assessment and Plan / ED Course  I have reviewed the triage vital signs and the nursing notes.  Pertinent labs & imaging results that were available during my care of the patient were reviewed by me and considered in my medical decision making (see chart for details).        Patient presents to the emergency department with complaints of low back pain radiating to the right leg.  Patient has a history of lumbar radiculopathy requiring surgery at L4-5 approximately 1 year ago.  Patient experiencing similar but milder symptoms currently.  No new injury.  Examination reveals normal strength.  She has subjective decreased sensation but otherwise neurologic exam is unremarkable.  No saddle anesthesia.  Area of numbness is on the lateral thigh.  She has not had any change in bowel or bladder function.  No signs of cauda equina syndrome or neurosurgical emergency on exam.  Patient administered Toradol, Decadron, Dilaudid with improvement.  She will be discharged with analgesia, steroid and NSAIDs, follow-up with her neurosurgeon.  Return if her symptoms worsen.  Final Clinical Impressions(s) / ED Diagnoses   Final diagnoses:  Lumbar radiculopathy    ED Discharge Orders         Ordered    predniSONE (DELTASONE) 20 MG tablet      01/03/19 0146    HYDROcodone-acetaminophen (NORCO/VICODIN) 5-325 MG tablet  Every 4 hours PRN     01/03/19 0146    diclofenac (CATAFLAM) 50 MG tablet  3 times daily     01/03/19 0146    methocarbamol (ROBAXIN) 500 MG tablet  Every 8 hours PRN     01/03/19 0146    HYDROcodone-acetaminophen (NORCO/VICODIN) 5-325 MG tablet  Every 4 hours PRN     01/03/19 0147           Gilda CreasePollina, Darcey Demma J, MD 01/03/19 0147

## 2019-01-08 DIAGNOSIS — M549 Dorsalgia, unspecified: Secondary | ICD-10-CM | POA: Diagnosis not present

## 2019-01-22 ENCOUNTER — Telehealth: Payer: Self-pay | Admitting: *Deleted

## 2019-01-22 NOTE — Telephone Encounter (Signed)
Called pt and LVM asking for cb to r/s her 8/5 appt. Advised MD out of office tomorrow. Left office number in message.

## 2019-01-23 ENCOUNTER — Ambulatory Visit: Payer: BLUE CROSS/BLUE SHIELD | Admitting: Neurology

## 2019-01-23 DIAGNOSIS — I10 Essential (primary) hypertension: Secondary | ICD-10-CM | POA: Diagnosis not present

## 2019-01-23 DIAGNOSIS — E119 Type 2 diabetes mellitus without complications: Secondary | ICD-10-CM | POA: Diagnosis not present

## 2019-01-23 DIAGNOSIS — M5441 Lumbago with sciatica, right side: Secondary | ICD-10-CM | POA: Diagnosis not present

## 2019-01-23 DIAGNOSIS — M5416 Radiculopathy, lumbar region: Secondary | ICD-10-CM | POA: Diagnosis not present

## 2019-01-23 NOTE — Telephone Encounter (Signed)
Called pt once more and LVM asking for cb to r/s her appt from yesterday 8/5.

## 2019-01-30 ENCOUNTER — Telehealth: Payer: Self-pay | Admitting: *Deleted

## 2019-01-30 ENCOUNTER — Ambulatory Visit: Payer: BLUE CROSS/BLUE SHIELD | Admitting: Neurology

## 2019-01-30 ENCOUNTER — Encounter: Payer: Self-pay | Admitting: Neurology

## 2019-01-30 NOTE — Telephone Encounter (Signed)
Pt no showed her f/u appt today. This is her 1st.

## 2019-02-01 DIAGNOSIS — Z9889 Other specified postprocedural states: Secondary | ICD-10-CM | POA: Diagnosis not present

## 2019-02-01 DIAGNOSIS — R29898 Other symptoms and signs involving the musculoskeletal system: Secondary | ICD-10-CM | POA: Diagnosis not present

## 2019-02-01 DIAGNOSIS — M5441 Lumbago with sciatica, right side: Secondary | ICD-10-CM | POA: Diagnosis not present

## 2019-02-01 DIAGNOSIS — G8929 Other chronic pain: Secondary | ICD-10-CM | POA: Diagnosis not present

## 2019-03-04 DIAGNOSIS — Z9889 Other specified postprocedural states: Secondary | ICD-10-CM | POA: Diagnosis not present

## 2019-03-04 DIAGNOSIS — M5416 Radiculopathy, lumbar region: Secondary | ICD-10-CM | POA: Diagnosis not present

## 2019-03-04 DIAGNOSIS — R51 Headache: Secondary | ICD-10-CM | POA: Diagnosis not present

## 2019-03-13 ENCOUNTER — Telehealth: Payer: Self-pay | Admitting: Adult Health

## 2019-03-13 NOTE — Telephone Encounter (Signed)
I called the pt and LVM (ok per DPR) advising we received her message and the appt has been canceled for tomorrow 9/24 at 3 pm. Left office number for any questions.

## 2019-03-13 NOTE — Telephone Encounter (Signed)
Patient called and left a voicemail requesting to cancel her appointment with Dr. Jaynee Eagles tomorrow at 3 PM.  I have canceled the appointment.  Can you please call patient and let her know that appointment has been canceled.

## 2019-03-14 ENCOUNTER — Ambulatory Visit: Payer: Self-pay | Admitting: Neurology

## 2019-04-13 DIAGNOSIS — M1000 Idiopathic gout, unspecified site: Secondary | ICD-10-CM | POA: Diagnosis not present

## 2019-04-13 DIAGNOSIS — Z6841 Body Mass Index (BMI) 40.0 and over, adult: Secondary | ICD-10-CM | POA: Diagnosis not present

## 2019-05-19 ENCOUNTER — Emergency Department (HOSPITAL_COMMUNITY): Payer: BC Managed Care – PPO

## 2019-05-19 ENCOUNTER — Emergency Department (HOSPITAL_COMMUNITY)
Admission: EM | Admit: 2019-05-19 | Discharge: 2019-05-19 | Disposition: A | Discharge status: Home / Self Care | Payer: BC Managed Care – PPO | Attending: Emergency Medicine | Admitting: Emergency Medicine

## 2019-05-19 ENCOUNTER — Other Ambulatory Visit: Payer: Self-pay

## 2019-05-19 ENCOUNTER — Encounter (HOSPITAL_COMMUNITY): Payer: Self-pay | Admitting: Emergency Medicine

## 2019-05-19 DIAGNOSIS — M545 Low back pain: Secondary | ICD-10-CM | POA: Diagnosis not present

## 2019-05-19 DIAGNOSIS — G44211 Episodic tension-type headache, intractable: Secondary | ICD-10-CM | POA: Insufficient documentation

## 2019-05-19 DIAGNOSIS — Z87891 Personal history of nicotine dependence: Secondary | ICD-10-CM | POA: Diagnosis not present

## 2019-05-19 DIAGNOSIS — Z7984 Long term (current) use of oral hypoglycemic drugs: Secondary | ICD-10-CM | POA: Diagnosis not present

## 2019-05-19 DIAGNOSIS — M5440 Lumbago with sciatica, unspecified side: Secondary | ICD-10-CM

## 2019-05-19 DIAGNOSIS — E119 Type 2 diabetes mellitus without complications: Secondary | ICD-10-CM | POA: Diagnosis not present

## 2019-05-19 DIAGNOSIS — Z79899 Other long term (current) drug therapy: Secondary | ICD-10-CM | POA: Insufficient documentation

## 2019-05-19 DIAGNOSIS — I10 Essential (primary) hypertension: Secondary | ICD-10-CM | POA: Diagnosis not present

## 2019-05-19 DIAGNOSIS — E039 Hypothyroidism, unspecified: Secondary | ICD-10-CM | POA: Diagnosis not present

## 2019-05-19 DIAGNOSIS — M5441 Lumbago with sciatica, right side: Secondary | ICD-10-CM | POA: Diagnosis not present

## 2019-05-19 DIAGNOSIS — R519 Headache, unspecified: Secondary | ICD-10-CM | POA: Diagnosis not present

## 2019-05-19 LAB — COMPREHENSIVE METABOLIC PANEL
ALT: 20 U/L (ref 0–44)
AST: 27 U/L (ref 15–41)
Albumin: 3.4 g/dL — ABNORMAL LOW (ref 3.5–5.0)
Alkaline Phosphatase: 94 U/L (ref 38–126)
Anion gap: 11 (ref 5–15)
BUN: 6 mg/dL (ref 6–20)
CO2: 29 mmol/L (ref 22–32)
Calcium: 9.1 mg/dL (ref 8.9–10.3)
Chloride: 99 mmol/L (ref 98–111)
Creatinine, Ser: 0.83 mg/dL (ref 0.44–1.00)
GFR calc Af Amer: >60 mL/min (ref >60)
GFR calc non Af Amer: >60 mL/min (ref >60)
Glucose, Bld: 169 mg/dL — ABNORMAL HIGH (ref 70–99)
Potassium: 3.9 mmol/L (ref 3.5–5.1)
Sodium: 139 mmol/L (ref 135–145)
Total Bilirubin: 0.7 mg/dL (ref 0.3–1.2)
Total Protein: 7.1 g/dL (ref 6.5–8.1)

## 2019-05-19 LAB — CBC WITH DIFFERENTIAL/PLATELET
Abs Immature Granulocytes: 0.04 10*3/uL (ref 0.00–0.07)
Basophils Absolute: 0.1 10*3/uL (ref 0.0–0.1)
Basophils Relative: 1 %
Eosinophils Absolute: 0.3 10*3/uL (ref 0.0–0.5)
Eosinophils Relative: 3 %
HCT: 38.2 % (ref 36.0–46.0)
Hemoglobin: 12.4 g/dL (ref 12.0–15.0)
Immature Granulocytes: 1 %
Lymphocytes Relative: 37 %
Lymphs Abs: 3 10*3/uL (ref 0.7–4.0)
MCH: 28.8 pg (ref 26.0–34.0)
MCHC: 32.5 g/dL (ref 30.0–36.0)
MCV: 88.8 fL (ref 80.0–100.0)
Monocytes Absolute: 0.4 10*3/uL (ref 0.1–1.0)
Monocytes Relative: 5 %
Neutro Abs: 4.4 10*3/uL (ref 1.7–7.7)
Neutrophils Relative %: 53 %
Platelets: 353 10*3/uL (ref 150–400)
RBC: 4.3 MIL/uL (ref 3.87–5.11)
RDW: 14.6 % (ref 11.5–15.5)
WBC: 8.1 10*3/uL (ref 4.0–10.5)
nRBC: 0 % (ref 0.0–0.2)

## 2019-05-19 LAB — URINALYSIS, ROUTINE W REFLEX MICROSCOPIC
Bilirubin Urine: NEGATIVE
Glucose, UA: NEGATIVE mg/dL
Hgb urine dipstick: NEGATIVE
Ketones, ur: NEGATIVE mg/dL
Leukocytes,Ua: NEGATIVE
Nitrite: NEGATIVE
Protein, ur: 30 mg/dL — AB
Specific Gravity, Urine: 1.021 (ref 1.005–1.030)
pH: 6 (ref 5.0–8.0)

## 2019-05-19 MED ORDER — KETOROLAC TROMETHAMINE 60 MG/2ML IM SOLN
60.0000 mg | Freq: Once | INTRAMUSCULAR | Status: AC
  Administered 2019-05-19: 60 mg via INTRAMUSCULAR
  Filled 2019-05-19: qty 2

## 2019-05-19 MED ORDER — HYDROCODONE-ACETAMINOPHEN 5-325 MG PO TABS: 2.0000 | ORAL_TABLET | ORAL | 0 refills | Status: DC | PRN

## 2019-05-19 MED ORDER — PREDNISONE 10 MG PO TABS: ORAL_TABLET | ORAL | 0 refills | Status: DC

## 2019-05-19 MED ORDER — HYDROMORPHONE HCL 1 MG/ML IJ SOLN
1.0000 mg | Freq: Once | INTRAMUSCULAR | Status: AC
  Administered 2019-05-19: 19:00:00 1 mg via INTRAMUSCULAR
  Filled 2019-05-19: qty 1

## 2019-05-19 NOTE — ED Triage Notes (Signed)
Patient c/o headache onset of 3:30 am and worse with movement. Reports issues with spinal fluid leaking in past. Back surgery in summer of 2019 and has had issues with bladder since.

## 2019-05-19 NOTE — ED Provider Notes (Signed)
[facility] Provider Note   CSN: [id number] Arrival date & time: 05/19/19  1339     History   Chief Complaint Chief Complaint  Patient presents with   Headache    HPI Sonya AusHolly E Gibson is a 33 y.o. female.     Patient complains of a low back pain and a headache.  Patient reports she became concerned today because the headache was worse than usual and she had difficulty urinating.  Patient states that the last time she experienced difficulty urinating was after she had spine surgery and had a cerebral spinal fluid leak.  Patient reports that the headache that she is experiencing is similar to what she had thin as well.  Patient reports her pain goes down her right leg she has numbness in her right leg.  Patient reports that she is scheduled to go into a pain management center but is not currently on any pain medications.  Patient denies any fever or chills she does not have any cough or URI symptoms denies any Covid exposures.  Patient states she has not suffered any new injury to her back she had surgery by Dr. Yetta BarreJones in 2019.  The history is provided by the patient. No language interpreter was used.  Headache Pain location:  Generalized Radiates to:  Does not radiate Chronicity:  New Relieved by:  Nothing Associated symptoms: no abdominal pain, no eye pain, no near-syncope, no sore throat and no URI     Past Medical History:  Diagnosis Date   Anxiety    Chronic back pain    Depression    Diabetes (HCC)    Drug-seeking behavior    Essential hypertension    GERD (gastroesophageal reflux disease)    Hypothyroidism    Lumbar radiculopathy, right    Migraine headache    PTSD (post-traumatic stress disorder)     Patient Active Problem List   Diagnosis Date Noted   Morbid obesity with BMI of 60.0-69.9, adult (HCC) 07/10/2018   Headache due to low cerebrospinal fluid pressure 07/09/2018   Postoperative CSF leak 07/09/2018     Convulsion, 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   DM (diabetes mellitus) (HCC) 07/08/2018   Headache 07/02/2018   Vaginal bleeding 07/02/2018   CSF leak 12/20/2017   Pressure injury of skin 12/20/2017   Lumbar radiculopathy    S/P lumbar laminectomy 12/05/2017   Perineal numbness 11/30/2017   Weakness of right lower extremity 11/17/2017   Lower back pain 11/15/2017   Hypothyroidism    Anxiety    Essential hypertension    PTSD (post-traumatic stress disorder)    Decreased sensation of leg    GESTATIONAL DIABETES 04/26/2010    Past Surgical History:  Procedure Laterality Date   ANTERIOR CRUCIATE LIGAMENT REPAIR Left    BACK SURGERY  12/05/2017   CESAREAN SECTION     IR EPIDUROGRAPHY  11/17/2017   KNEE SURGERY     LUMBAR LAMINECTOMY/DECOMPRESSION MICRODISCECTOMY Right 12/05/2017   Procedure: Right L4-5 Microdiskectomy;  Surgeon: Tia AlertJones, David S, MD;  [facility];  Service: Neurosurgery;  Laterality: Right;   LUMBAR LAMINECTOMY/DECOMPRESSION MICRODISCECTOMY N/A 12/20/2017   Procedure: Repair of CSF Leak;  Surgeon: Tia AlertJones, David S, MD;  [facility];  Service: Neurosurgery;  Laterality: N/A;   LUMBAR WOUND DEBRIDEMENT N/A 12/27/2017   Procedure: Re-exploration for CSF leak repair and placement of lumbar drain;  Surgeon: Tia AlertJones, David S, MD;  [facility];  Service: Neurosurgery;  Laterality: N/A;   middle finger reatachment  PLACEMENT OF LUMBAR DRAIN N/A 12/27/2017   Procedure: PLACEMENT OF LUMBAR DRAIN;  Surgeon: Tia Alert, MD;  Location: [facility] OR;  Service: Neurosurgery;  Laterality: N/A;   TONSILLECTOMY AND ADENOIDECTOMY  1996   VENTRICULOPERITONEAL SHUNT N/A 01/08/2018   Procedure: Re-exploration and repair of previous Lumbar cerebro-spinal fluid leak and placement of Lumbar drain;  Surgeon: Tia Alert, MD;  Location[facility] OR;  Service: Neurosurgery;  Laterality: N/A;     OB History    Gravida  1   Para      Term      Preterm      AB       Living        SAB      TAB      Ectopic      Multiple      Live Births               Home Medications    Prior to Admission medications   Medication Sig Start Date End Date Taking? Authorizing Provider  acetaminophen (TYLENOL) 325 MG tablet Take 650 mg by mouth every 6 (six) hours as needed.    [provider]  albuterol (VENTOLIN HFA) 108 (90 Base) MCG/ACT inhaler Inhale 2 puffs into the lungs every 6 (six) hours as needed for wheezing or shortness of breath.    [provider]  Butalbital-APAP-Caffeine 50-300-40 MG CAPS Take 1 capsule by mouth every 4 (four) hours as needed (headache).  12/19/17   [provider]  calcium carbonate (TUMS EX) 750 MG chewable tablet Chew 2 tablets by mouth as needed for heartburn.    [provider]  cyanocobalamin (,VITAMIN B-12,) 1000 MCG/ML injection Inject 1,000 mcg into the muscle See admin instructions. Every two weeks 02/16/18   [provider]  cyclobenzaprine (FLEXERIL) 10 MG tablet Take 1 tablet (10 mg total) by mouth 3 (three) times daily as needed for muscle spasms. 12/06/17   Osvaldo Shipper, MD  diclofenac (CATAFLAM) 50 MG tablet Take 1 tablet (50 mg total) by mouth 3 (three) times daily. 01/03/19   Gilda Crease, MD  diphenhydrAMINE (BENADRYL) 50 MG capsule Take 50 mg by mouth every 6 (six) hours as needed.    [provider]  HYDROcodone-acetaminophen (NORCO/VICODIN) 5-325 MG tablet Take 2 tablets by mouth every 4 (four) hours as needed. 05/19/19   Elson Areas, PA-C  ibuprofen (ADVIL,MOTRIN) 200 MG tablet Take 400 mg by mouth every 6 (six) hours as needed.    [provider]  levETIRAcetam (KEPPRA) 500 MG tablet Take 1 tablet (500 mg total) by mouth 2 (two) times daily. 07/10/18   Catarina Hartshorn, MD  Melatonin 10 MG TABS Take 10 mg by mouth daily as needed (sleep).    [provider]  metFORMIN (GLUCOPHAGE) 500 MG tablet Take 1 tablet by mouth 2 (two)  times daily with a meal.  09/25/17 09/20/18  [provider]  methocarbamol (ROBAXIN) 500 MG tablet Take 1 tablet (500 mg total) by mouth every 8 (eight) hours as needed for muscle spasms. 01/03/19   Gilda Crease, MD  metoprolol tartrate (LOPRESSOR) 25 MG tablet Take 0.5 tablets (12.5 mg total) by mouth 2 (two) times daily. 07/10/18   Catarina Hartshorn, MD  predniSONE (DELTASONE) 10 MG tablet 6,5,4,3,2,1 taper 05/19/19   Elson Areas, PA-C  Prenatal Vit-Fe Fumarate-FA (PRENATAL MULTIVITAMIN) TABS tablet Take 2 tablets by mouth Nightly.    [provider]  promethazine (PHENERGAN) 25 MG  tablet Take 1 tablet (25 mg total) by mouth every 6 (six) hours as needed for nausea or vomiting. 07/06/18   Melvenia Beam, MD  traZODone (DESYREL) 150 MG tablet Take 150 mg by mouth at bedtime as needed for sleep.    [provider]  venlafaxine XR (EFFEXOR-XR) 150 MG 24 hr capsule Take 150 mg by mouth at bedtime.  06/06/17 07/08/18  [provider]  venlafaxine XR (EFFEXOR-XR) 75 MG 24 hr capsule Take 75 mg by mouth at bedtime.  12/12/17   [provider]  Vitamin D, Ergocalciferol, (DRISDOL) 1.25 MG (50000 UT) CAPS capsule Take 50,000 Units by mouth every 7 (seven) days.    [provider]    Family History Family History  Problem Relation Age of Onset   Diabetes Mellitus II Mother    Hypertension Mother    Seizures Mother    Other Mother        post stroke headaches   Stroke Mother    Hypertension Father    Diabetes Mellitus II Father    Hyperlipidemia Father    Bipolar disorder Sister    Asthma Son    Autism Son    Tourette syndrome Son    Autism Daughter        level 3   Other Daughter        tibia torsion & femoral aniversion    Social History Social History   Tobacco Use   Smoking status: Former Smoker    Packs/day: 2.00    Years: 3.00    Pack years: 6.00    Types: Cigarettes    Quit date: 2006    Years since  quitting: 14.9   Smokeless tobacco: Never Used  Substance Use Topics   Alcohol use: Yes    Comment: socially   Drug use: No     Allergies   Cinnamon, Coconut oil, Lovenox [enoxaparin], Nitrofurantoin, and Other   Review of Systems Review of Systems  HENT: Negative for sore throat.   Eyes: Negative for pain.  Cardiovascular: Negative for near-syncope.  Gastrointestinal: Negative for abdominal pain.  Neurological: Positive for headaches.  All other systems reviewed and are negative.    Physical Exam Updated Vital Signs BP (!) 149/90    Pulse (!) 117    Temp 98.3 F (36.8 C) (Oral)    Resp 16    LMP 05/17/2019    SpO2 98%   Physical Exam Vitals signs and nursing note reviewed.  Constitutional:      Appearance: She is well-developed. She is obese.  HENT:     Head: Normocephalic.     Mouth/Throat:     Mouth: Mucous membranes are moist.  Eyes:     Extraocular Movements: Extraocular movements intact.  Neck:     Musculoskeletal: Normal range of motion.  Cardiovascular:     Rate and Rhythm: Normal rate.  Pulmonary:     Effort: Pulmonary effort is normal.  Abdominal:     General: There is no distension.  Musculoskeletal: Normal range of motion.  Skin:    General: Skin is warm.  Neurological:     Mental Status: She is alert and oriented to person, place, and time.     Cranial Nerves: Cranial nerve deficit present.     Gait: Gait normal.  Psychiatric:        Mood and Affect: Mood normal.    Patient is able to ambulate down the hall to the restroom.  Patient is able to obtain  a urine which shows no sign of infection.  I reviewed patient's records she has a complicated history after having a laminectomy.  I discussed the patient with Dr. Jeraldine Loots.  I obtained an MRI of patient's lumbar spine MRI is reviewed I discussed MRI results with Dr. Yetta Barre who reports there is no change and MRI.  The fluid collection which is seen on the MRI is residual from the laminectomy there  is no acute fluid leak.  Patient is given an injection of Toradol 60 mg and 1 mg of Dilaudid I will start her on a prednisone 6-day taper and she is given a prescription for 10 hydrocodone patient is advised to discuss pain management with her physician.  ED Treatments / Results  Labs (all labs ordered are listed, but only abnormal results are displayed) Labs Reviewed  COMPREHENSIVE METABOLIC PANEL - Abnormal; Notable for the following components:      Result Value   Glucose, Bld 169 (*)    Albumin 3.4 (*)    All other components within normal limits  URINALYSIS, ROUTINE W REFLEX MICROSCOPIC - Abnormal; Notable for the following components:   APPearance CLOUDY (*)    Protein, ur 30 (*)    Bacteria, UA RARE (*)    All other components within normal limits  CBC WITH DIFFERENTIAL/PLATELET    EKG None  Radiology Mr Lumbar Spine Wo Contrast  Result Date: 05/19/2019 CLINICAL DATA:  Low back pain and urinary retention. History of prior lumbar surgery and CSF leak. EXAM: MRI LUMBAR SPINE WITHOUT CONTRAST TECHNIQUE: Multiplanar, multisequence MR imaging of the lumbar spine was performed. No intravenous contrast was administered. COMPARISON:  CT myelogram 01/04/2018. MRI lumbar spine 12/20/2017. FINDINGS: Segmentation:  Standard. Alignment:  Physiologic. Vertebrae:  No fracture, evidence of discitis, or bone lesion. Conus medullaris and cauda equina: Conus extends to the T12-L1 level. Conus and cauda equina appear normal. Paraspinal and other soft tissues: A fluid collection measuring 1.5 cm craniocaudal by 1.5 cm AP x 0.8 cm transverse is seen in the laminectomy bed at L4-5 and could be a collection of CSF related to the patient's prior leak or a seroma. Disc levels: T11-12 is imaged in the sagittal plane only and negative. T12-L1, L1-2, L2-3: Negative. L3-4: Mild facet degenerative change and a minimal disc bulge without stenosis. L4-5: Status post posterior decompression. There is a shallow disc  bulge and small annular fissure but the central canal and foramina are patent. L5-S1: Right worse than left facet degenerative disease. Very shallow disc bulge. No stenosis. IMPRESSION: 1. Status post posterior decompression at L4-5. The central canal and foramina are widely patent. Small fluid collection in the laminectomy bed at L4-5 could be a collection of CSF related to the patient's prior leak or a seroma. 2. Right worse than left facet degenerative disease L5-S1. Electronically Signed   By: Drusilla Kanner M.D.   On: 05/19/2019 16:52    Procedures Procedures (including critical care time)  Medications Ordered in ED Medications  ketorolac (TORADOL) injection 60 mg (has no administration in time range)  HYDROmorphone (DILAUDID) injection 1 mg (has no administration in time range)     Initial Impression / Assessment and Plan / ED Course  I have reviewed the triage vital signs and the nursing notes.  Pertinent labs & imaging results that were available during my care of the patient were reviewed by me and considered in my medical decision making (see chart for details).        An After  Visit Summary was printed and given to the patient.   Final Clinical Impressions(s) / ED Diagnoses   Final diagnoses:  Intractable episodic tension-type headache  Acute right-sided low back pain with sciatica, sciatica laterality unspecified    ED Discharge Orders         Ordered    predniSONE (DELTASONE) 10 MG tablet     05/19/19 1806    HYDROcodone-acetaminophen (NORCO/VICODIN) 5-325 MG tablet  Every 4 hours PRN     05/19/19 1806           Osie Cheeks 05/19/19 Marlow Baars, MD 05/22/19 [phone]

## 2019-05-19 NOTE — Discharge Instructions (Addendum)
Follow up with your Physician for recheck  

## 2019-05-28 DIAGNOSIS — M792 Neuralgia and neuritis, unspecified: Secondary | ICD-10-CM | POA: Diagnosis not present

## 2019-05-28 DIAGNOSIS — M5416 Radiculopathy, lumbar region: Secondary | ICD-10-CM | POA: Diagnosis not present

## 2019-05-28 DIAGNOSIS — M5441 Lumbago with sciatica, right side: Secondary | ICD-10-CM | POA: Diagnosis not present

## 2019-05-28 DIAGNOSIS — Z79899 Other long term (current) drug therapy: Secondary | ICD-10-CM | POA: Diagnosis not present

## 2019-06-02 ENCOUNTER — Other Ambulatory Visit: Payer: Self-pay

## 2019-06-02 ENCOUNTER — Ambulatory Visit
Admission: EM | Admit: 2019-06-02 | Discharge: 2019-06-02 | Disposition: A | Discharge status: Home / Self Care | Payer: BC Managed Care – PPO

## 2019-06-02 DIAGNOSIS — R05 Cough: Secondary | ICD-10-CM

## 2019-06-02 DIAGNOSIS — M791 Myalgia, unspecified site: Secondary | ICD-10-CM

## 2019-06-02 DIAGNOSIS — R519 Headache, unspecified: Secondary | ICD-10-CM

## 2019-06-02 DIAGNOSIS — J069 Acute upper respiratory infection, unspecified: Secondary | ICD-10-CM | POA: Diagnosis not present

## 2019-06-02 DIAGNOSIS — R0602 Shortness of breath: Secondary | ICD-10-CM

## 2019-06-02 DIAGNOSIS — B9789 Other viral agents as the cause of diseases classified elsewhere: Secondary | ICD-10-CM | POA: Diagnosis not present

## 2019-06-02 DIAGNOSIS — Z20828 Contact with and (suspected) exposure to other viral communicable diseases: Secondary | ICD-10-CM

## 2019-06-02 DIAGNOSIS — R439 Unspecified disturbances of smell and taste: Secondary | ICD-10-CM

## 2019-06-02 DIAGNOSIS — Z20822 Contact with and (suspected) exposure to covid-19: Secondary | ICD-10-CM

## 2019-06-02 LAB — POC SARS CORONAVIRUS 2 AG -  ED: SARS Coronavirus 2 Ag: NEGATIVE

## 2019-06-02 LAB — POCT INFLUENZA A/B
Influenza A, POC: NEGATIVE
Influenza B, POC: NEGATIVE

## 2019-06-02 MED ORDER — HYDROCODONE-HOMATROPINE 5-1.5 MG/5ML PO SYRP: 5.0000 mL | ORAL_SOLUTION | Freq: Four times a day (QID) | ORAL | 0 refills | Status: DC | PRN

## 2019-06-02 MED ORDER — BENZONATATE 100 MG PO CAPS: 100.0000 mg | ORAL_CAPSULE | Freq: Three times a day (TID) | ORAL | 0 refills | Status: DC

## 2019-06-02 NOTE — ED Triage Notes (Signed)
Pt presents to UC w/ c/o cough, sob, fatigue, loss of taste/smell, body aches, headache x3 days.

## 2019-06-02 NOTE — ED Provider Notes (Signed)
[place]   [id number] 06/02/19 Arrival Time: 1108   CC: COVID symptoms  SUBJECTIVE: History from: patient.  Sonya Gibson is a 33 y.o. female who presents with dry cough, SOB, fatigue, loss of taste/ smell, body aches, and headache x 3 days.  Works as a Pharmacist, hospital at [employer].  Has tried ibuprofen, tylenol, mucinex, and robitussin with minimal relief.  SOB made worse with activity.  Denies previous COVID infection in the past.  Also mentions fever with tmax of 101.3 last night, and slight sore throat. Denies chills, sinus pain, rhinorrhea, wheezing, chest pain, nausea, vomiting, changes in bowel or bladder habits.    ROS: As per HPI.  All other pertinent ROS negative.     Past Medical History:  Diagnosis Date  . Anxiety   . Chronic back pain   . Depression   . Diabetes ([place])   . Drug-seeking behavior   . Essential hypertension   . GERD (gastroesophageal reflux disease)   . Hypothyroidism   . Lumbar radiculopathy, right   . Migraine headache   . PTSD (post-traumatic stress disorder)    Past Surgical History:  Procedure Laterality Date  . ANTERIOR CRUCIATE LIGAMENT REPAIR Left   . BACK SURGERY  12/05/2017  . CESAREAN SECTION    . IR EPIDUROGRAPHY  11/17/2017  . KNEE SURGERY    . LUMBAR LAMINECTOMY/DECOMPRESSION MICRODISCECTOMY Right 12/05/2017   Procedure: Right L4-5 Microdiskectomy;  Surgeon: Eustace Moore, MD;  Location: [place];  Service: Neurosurgery;  Laterality: Right;  . LUMBAR LAMINECTOMY/DECOMPRESSION MICRODISCECTOMY N/A 12/20/2017   Procedure: Repair of CSF Leak;  Surgeon: Eustace Moore, MD;  Location: [place];  Service: Neurosurgery;  Laterality: N/A;  . LUMBAR WOUND DEBRIDEMENT N/A 12/27/2017   Procedure: Re-exploration for CSF leak repair and placement of lumbar drain;  Surgeon: Eustace Moore, MD;  Location: [place];  Service: Neurosurgery;  Laterality: N/A;  . middle finger reatachment    . PLACEMENT OF LUMBAR DRAIN N/A 12/27/2017   Procedure: PLACEMENT OF LUMBAR DRAIN;  Surgeon: Eustace Moore, MD;  Location: [place];  Service: Neurosurgery;  Laterality: N/A;  . TONSILLECTOMY AND ADENOIDECTOMY  1996  . VENTRICULOPERITONEAL SHUNT N/A 01/08/2018   Procedure: Re-exploration and repair of previous Lumbar cerebro-spinal fluid leak and placement of Lumbar drain;  Surgeon: Eustace Moore, MD;  Location: [place];  Service: Neurosurgery;  Laterality: N/A;   Allergies  Allergen Reactions  . Cinnamon Anaphylaxis    (NO "REAL"/TREE BARK CINNAMON)  . Coconut Oil Anaphylaxis    Can eat "fake" coconut  . Lovenox [Enoxaparin] Other (See Comments)    BROKE DOWN THE SKIN AT INJECTION SITE AND CAUSED A WOUND  . Nitrofurantoin Hives  . Other     "Headache cocktail"   Blister to abd   No current facility-administered medications on file prior to encounter.   Current Outpatient Medications on File Prior to Encounter  Medication Sig Dispense Refill  . DULoxetine (CYMBALTA) 60 MG capsule Take 120 mg by mouth daily.    Marland Kitchen gabapentin (NEURONTIN) 600 MG tablet Take 600 mg by mouth 3 (three) times daily.    Marland Kitchen acetaminophen (TYLENOL) 325 MG tablet Take 650 mg by mouth every 6 (six) hours as needed.    Marland Kitchen albuterol (VENTOLIN HFA) 108 (90 Base) MCG/ACT inhaler Inhale 2 puffs into the lungs every 6 (six) hours as needed for wheezing or shortness of breath.    . Butalbital-APAP-Caffeine 50-300-40 MG CAPS Take 1 capsule by mouth  every 4 (four) hours as needed (headache).   0  . calcium carbonate (TUMS EX) 750 MG chewable tablet Chew 2 tablets by mouth as needed for heartburn.    . cyanocobalamin (,VITAMIN B-12,) 1000 MCG/ML injection Inject 1,000 mcg into the muscle See admin instructions. Every two weeks  5  . cyclobenzaprine (FLEXERIL) 10 MG tablet Take 1 tablet (10 mg total) by mouth 3 (three) times daily as needed for muscle spasms. 30 tablet 0  . diclofenac (CATAFLAM) 50 MG tablet Take 1 tablet (50 mg total) by mouth 3 (three) times daily. 30 tablet  0  . diphenhydrAMINE (BENADRYL) 50 MG capsule Take 50 mg by mouth every 6 (six) hours as needed.    Marland Kitchen HYDROcodone-acetaminophen (NORCO/VICODIN) 5-325 MG tablet Take 2 tablets by mouth every 4 (four) hours as needed. 10 tablet 0  . ibuprofen (ADVIL,MOTRIN) 200 MG tablet Take 400 mg by mouth every 6 (six) hours as needed.    . levETIRAcetam (KEPPRA) 500 MG tablet Take 1 tablet (500 mg total) by mouth 2 (two) times daily. 60 tablet 1  . Melatonin 10 MG TABS Take 10 mg by mouth daily as needed (sleep).    . metFORMIN (GLUCOPHAGE) 500 MG tablet Take 1 tablet by mouth 2 (two) times daily with a meal.     . Prenatal Vit-Fe Fumarate-FA (PRENATAL MULTIVITAMIN) TABS tablet Take 2 tablets by mouth Nightly.    . promethazine (PHENERGAN) 25 MG tablet Take 1 tablet (25 mg total) by mouth every 6 (six) hours as needed for nausea or vomiting. 60 tablet 2  . traZODone (DESYREL) 150 MG tablet Take 150 mg by mouth at bedtime as needed for sleep.    . Vitamin D, Ergocalciferol, (DRISDOL) 1.25 MG (50000 UT) CAPS capsule Take 50,000 Units by mouth every 7 (seven) days.    . [DISCONTINUED] metoprolol tartrate (LOPRESSOR) 25 MG tablet Take 0.5 tablets (12.5 mg total) by mouth 2 (two) times daily. 60 tablet 1  . [DISCONTINUED] venlafaxine XR (EFFEXOR-XR) 75 MG 24 hr capsule Take 75 mg by mouth at bedtime.   3   Social History   Socioeconomic History  . Marital status: Married    Spouse name: Not on file  . Number of children: 3  . 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 Highest education level: Not on file  Occupational History  . Not on file  Tobacco Use  . Smoking status: Former Smoker    Packs/day: 2.00    Years: 3.00    Pack years: 6.00    Types: Cigarettes    Quit date: 2006    Years since quitting: 14.9  . Smokeless tobacco: Never Used  Substance and Sexual Activity  . Alcohol use: Yes    Comment: socially  . Drug use: No  . Sexual activity: Yes    Birth control/protection: None  Other Topics Concern   . Not on file  Social History Narrative   Lives at home with husband and kids   Right handed   Caffeine: 6 cups daily   Social Determinants of Health   Financial Resource Strain:   . Difficulty of Paying Living Expenses: Not on file  Food Insecurity:   . Worried About Charity fundraiser in the Last Year: Not on file  . Ran Out of Food in the Last Year: Not on file  Transportation Needs:   . Lack of Transportation (Medical): Not on file  . Lack of Transportation (Non-Medical): Not on file  Physical Activity:   . Days of Exercise per Week: Not on file  . Minutes of Exercise per Session: Not on file  Stress:   . Feeling of Stress : Not on file  Social Connections:   . Frequency of Communication with Friends and Family: Not on file  . Frequency of Social Gatherings with Friends and Family: Not on file  . Attends Religious Services: Not on file  . Active Member of Clubs or Organizations: Not on file  . Attends Archivist Meetings: Not on file  . Marital Status: Not on file  Intimate Partner Violence:   . Fear of Current or Ex-Partner: Not on file  . Emotionally Abused: Not on file  . Physically Abused: Not on file  . Sexually Abused: Not on file   Family History  Problem Relation Age of Onset  . Diabetes Mellitus II Mother   . Hypertension Mother   . Seizures Mother   . Other Mother        post stroke headaches  . Stroke Mother   . Hypertension Father   . Diabetes Mellitus II Father   . Hyperlipidemia Father   . Bipolar disorder Sister   . Asthma Son   . Autism Son   . Tourette syndrome Son   . Autism Daughter        level 3  . Other Daughter        tibia torsion & femoral aniversion    OBJECTIVE:  Vitals:   06/02/19 1151  BP: 117/81  Pulse: (!) 121  Resp: 18  Temp: 98.6 F (37 C)  TempSrc: Oral  SpO2: 95%     General appearance: alert; appears fatigued, but nontoxic; speaking in full sentences and tolerating own secretions HEENT: NCAT;  Ears: EACs clear, TMs pearly gray; Eyes: PERRL.  EOM grossly intact. Nose: nares patent without rhinorrhea, Throat: oropharynx clear, tonsils non erythematous or enlarged, uvula midline  Neck: supple without LAD Lungs: unlabored respirations, symmetrical air entry; cough: moderate; no respiratory distress; decreased breath sounds throught, slight wheezes heard in upper RT posterior lung Heart: Tachycardia; 113 bpm during exam Skin: warm and dry Psychological: alert and cooperative; normal mood and affect  LABS:  Results for orders placed or performed during the hospital encounter of 06/02/19 (from the past 24 hour(s))  POC SARS Coronavirus 2 Ag-ED - Nasal Swab (BD Veritor Kit)     Status: None   Collection Time: 06/02/19 12:20 PM  Result Value Ref Range   SARS Coronavirus 2 Ag Negative Negative  POCT Influenza A/B     Status: None   Collection Time: 06/02/19 12:20 PM  Result Value Ref Range   Influenza A, POC Negative Negative   Influenza B, POC Negative Negative     ASSESSMENT & PLAN:  1. Suspected COVID-19 virus infection   2. Viral URI with cough     Meds ordered this encounter  Medications  . benzonatate (TESSALON) 100 MG capsule    Sig: Take 1 capsule (100 mg total) by mouth every 8 (eight) hours.    Dispense:  21 capsule    Refill:  0    Order Specific Question:   Supervising Provider    Answer:   Raylene Everts [[id number]]  . HYDROcodone-homatropine (HYCODAN) 5-1.5 MG/5ML syrup    Sig: Take 5 mLs by mouth every 6 (six) hours as needed for cough.    Dispense:  60 mL    Refill:  0    Order Specific Question:  Supervising Provider    Answer:   Raylene Everts [[id number]]   Flu negative Rapid COVID negative.  Culture sent.  Patient should remain in quarantine until they have received culture results.  If negative you may resume normal activities (go back to work/school) while practicing hand hygiene, social distance, and mask wearing.  If positive, patient should  remain in quarantine for 10 days from symptom onset AND greater than 72 hours after symptoms resolution (absence of fever without the use of fever-reducing medication and improvement in respiratory symptoms), whichever is longer Get plenty of rest and push fluids Use OTC zyrtec for nasal congestion, runny nose, and/or sore throat Use OTC flonase for nasal congestion and runny nose Use medications daily for symptom relief Tessalon and hycodan syrup prescribed for cough.  Take hycodan at bedtime for cough.  This medication may make you drowsy, so do not take prior to driving or operating heavy machinery Use OTC medications like ibuprofen or tylenol as needed fever or pain Call or go to the ED if you have any new or worsening symptoms such as fever, worsening cough, shortness of breath, chest tightness, chest pain, turning blue, changes in mental status, etc...    Reviewed expectations re: course of current medical issues. Questions answered. Outlined signs and symptoms indicating need for more acute intervention. Patient verbalized understanding. After Visit Summary given.         Lestine Box, PA-C 06/02/19 1326

## 2019-06-02 NOTE — Discharge Instructions (Signed)
Rapid COVID negative.  Culture sent.  Patient should remain in quarantine until they have received culture results.  If negative you may resume normal activities (go back to work/school) while practicing hand hygiene, social distance, and mask wearing.  If positive, patient should remain in quarantine for 10 days from symptom onset AND greater than 72 hours after symptoms resolution (absence of fever without the use of fever-reducing medication and improvement in respiratory symptoms), whichever is longer Get plenty of rest and push fluids Use OTC zyrtec for nasal congestion, runny nose, and/or sore throat Use OTC flonase for nasal congestion and runny nose Use medications daily for symptom relief Tessalon and hycodan syrup prescribed for cough.  Take hycodan at bedtime for cough.  This medication may make you drowsy, so do not take prior to driving or operating heavy machinery Use OTC medications like ibuprofen or tylenol as needed fever or pain Call or go to the ED if you have any new or worsening symptoms such as fever, worsening cough, shortness of breath, chest tightness, chest pain, turning blue, changes in mental status, etc..Marland Kitchen

## 2019-06-04 ENCOUNTER — Telehealth: Payer: Self-pay | Admitting: Urgent Care

## 2019-06-04 MED ORDER — HYDROCODONE-HOMATROPINE 5-1.5 MG/5ML PO SYRP: 5.0000 mL | ORAL_SOLUTION | Freq: Every evening | ORAL | 0 refills | Status: DC | PRN

## 2019-06-04 NOTE — Telephone Encounter (Signed)
Patient states that the pharmacies that she normally uses have run out of Hycodan.  [place] database shows that patient has moderate to high risk, has a history of drug-seeking behavior as written in her medical chart.  Will prescribe small supply of 50 mL, patient is to use 5 mL at bedtime for cough suppression as needed only.  Prescription was printed as the Isabella device is not working.

## 2019-06-05 LAB — NOVEL CORONAVIRUS, NAA: SARS-CoV-2, NAA: NOT DETECTED

## 2019-06-18 DIAGNOSIS — Z6841 Body Mass Index (BMI) 40.0 and over, adult: Secondary | ICD-10-CM | POA: Diagnosis not present

## 2019-06-18 DIAGNOSIS — E119 Type 2 diabetes mellitus without complications: Secondary | ICD-10-CM | POA: Diagnosis not present

## 2019-06-18 DIAGNOSIS — E661 Drug-induced obesity: Secondary | ICD-10-CM | POA: Diagnosis not present

## 2019-06-18 DIAGNOSIS — F419 Anxiety disorder, unspecified: Secondary | ICD-10-CM | POA: Diagnosis not present

## 2019-07-12 ENCOUNTER — Ambulatory Visit
Admission: EM | Admit: 2019-07-12 | Discharge: 2019-07-12 | Disposition: A | Discharge status: Home / Self Care | Payer: BLUE CROSS/BLUE SHIELD | Attending: Emergency Medicine | Admitting: Emergency Medicine

## 2019-07-12 ENCOUNTER — Other Ambulatory Visit: Payer: Self-pay

## 2019-07-12 DIAGNOSIS — N3001 Acute cystitis with hematuria: Secondary | ICD-10-CM | POA: Diagnosis present

## 2019-07-12 LAB — POCT URINALYSIS DIP (MANUAL ENTRY)
Glucose, UA: NEGATIVE mg/dL
Nitrite, UA: NEGATIVE
Protein Ur, POC: 100 mg/dL — AB
Spec Grav, UA: >=1.03 — AB (ref 1.010–1.025)
Urobilinogen, UA: 0.2 E.U./dL
pH, UA: 5.5 (ref 5.0–8.0)

## 2019-07-12 LAB — POCT URINE PREGNANCY: Preg Test, Ur: NEGATIVE

## 2019-07-12 MED ORDER — CEPHALEXIN 500 MG PO CAPS: 500.0000 mg | ORAL_CAPSULE | Freq: Two times a day (BID) | ORAL | 0 refills | Status: AC

## 2019-07-12 MED ORDER — FLUCONAZOLE 200 MG PO TABS: ORAL_TABLET | ORAL | 0 refills | Status: DC

## 2019-07-12 MED ORDER — PHENAZOPYRIDINE HCL 200 MG PO TABS: 200.0000 mg | ORAL_TABLET | Freq: Three times a day (TID) | ORAL | 0 refills | Status: DC

## 2019-07-12 NOTE — ED Triage Notes (Signed)
Pt presents to UC w/ c/o frequent urination, vaginal burning, malodorous urine lower abd pressure x3 days.

## 2019-07-12 NOTE — ED Provider Notes (Signed)
[facility]   CC: "UTI"  SUBJECTIVE:  Sonya Gibson is a 34 y.o. female who complains of urinary frequency, vaginal burning with urination, malodorous urine, and lower abdominal pressure x 3 days.  Admits to sitting in a hot tub prior to symtpoms.  Has tried OTC AZO with minimal relief.  Symptoms are made worse with urination.  Admits to similar symptoms in the past.  Denies fever, chills, nausea, vomiting, abdominal pain, flank pain, abnormal vaginal discharge or bleeding, hematuria.    LMP: Patient's last menstrual period was 05/09/2019.  ROS: As in HPI.  All other pertinent ROS negative.     Past Medical History:  Diagnosis Date  . Anxiety   . Chronic back pain   . Depression   . Diabetes ([facility])   . Drug-seeking behavior   . Essential hypertension   . GERD (gastroesophageal reflux disease)   . Hypothyroidism   . Lumbar radiculopathy, right   . Migraine headache   . PTSD (post-traumatic stress disorder)    Past Surgical History:  Procedure Laterality Date  . ANTERIOR CRUCIATE LIGAMENT REPAIR Left   . BACK SURGERY  12/05/2017  . CESAREAN SECTION    . IR EPIDUROGRAPHY  11/17/2017  . KNEE SURGERY    . LUMBAR LAMINECTOMY/DECOMPRESSION MICRODISCECTOMY Right 12/05/2017   Procedure: Right L4-5 Microdiskectomy;  Surgeon: Tia Alert, MD;  Location: [facility] OR;  Service: Neurosurgery;  Laterality: Right;  . LUMBAR LAMINECTOMY/DECOMPRESSION MICRODISCECTOMY N/A 12/20/2017   Procedure: Repair of CSF Leak;  Surgeon: Tia Alert, MD;  Location: [facility] OR;  Service: Neurosurgery;  Laterality: N/A;  . LUMBAR WOUND DEBRIDEMENT N/A 12/27/2017   Procedure: Re-exploration for CSF leak repair and placement of lumbar drain;  Surgeon: Tia Alert, MD;  Location: [facility] OR;  Service: Neurosurgery;  Laterality: N/A;  . middle finger reatachment    . PLACEMENT OF LUMBAR DRAIN N/A 12/27/2017   Procedure: PLACEMENT OF LUMBAR DRAIN;  Surgeon: Tia Alert, MD;  Location: [facility] OR;  Service:  Neurosurgery;  Laterality: N/A;  . TONSILLECTOMY AND ADENOIDECTOMY  1996  . VENTRICULOPERITONEAL SHUNT N/A 01/08/2018   Procedure: Re-exploration and repair of previous Lumbar cerebro-spinal fluid leak and placement of Lumbar drain;  Surgeon: Tia Alert, MD;  Location: [facility] OR;  Service: Neurosurgery;  Laterality: N/A;   Allergies  Allergen Reactions  . Cinnamon Anaphylaxis    (NO "REAL"/TREE BARK CINNAMON)  . Coconut Oil Anaphylaxis    Can eat "fake" coconut  . Lovenox [Enoxaparin] Other (See Comments)    BROKE DOWN THE SKIN AT INJECTION SITE AND CAUSED A WOUND  . Nitrofurantoin Hives  . Other     "Headache cocktail"   Blister to abd   No current facility-administered medications on file prior to encounter.   Current Outpatient Medications on File Prior to Encounter  Medication Sig Dispense Refill  . acetaminophen (TYLENOL) 325 MG tablet Take 650 mg by mouth every 6 (six) hours as needed.    Marland Kitchen albuterol (VENTOLIN HFA) 108 (90 Base) MCG/ACT inhaler Inhale 2 puffs into the lungs every 6 (six) hours as needed for wheezing or shortness of breath.    . Butalbital-APAP-Caffeine 50-300-40 MG CAPS Take 1 capsule by mouth every 4 (four) hours as needed (headache).   0  . calcium carbonate (TUMS EX) 750 MG chewable tablet Chew 2 tablets by mouth as needed for heartburn.    . cyanocobalamin (,VITAMIN B-12,) 1000 MCG/ML injection Inject 1,000 mcg into the muscle See admin instructions. Every  two weeks  5  . DULoxetine (CYMBALTA) 60 MG capsule Take 120 mg by mouth daily.    Marland Kitchen gabapentin (NEURONTIN) 600 MG tablet Take 600 mg by mouth 3 (three) times daily.    Marland Kitchen ibuprofen (ADVIL,MOTRIN) 200 MG tablet Take 400 mg by mouth every 6 (six) hours as needed.    . Melatonin 10 MG TABS Take 10 mg by mouth daily as needed (sleep).    . metFORMIN (GLUCOPHAGE) 500 MG tablet Take 1 tablet by mouth 2 (two) times daily with a meal.     . Prenatal Vit-Fe Fumarate-FA (PRENATAL MULTIVITAMIN) TABS tablet Take 2  tablets by mouth Nightly.    . promethazine (PHENERGAN) 25 MG tablet Take 1 tablet (25 mg total) by mouth every 6 (six) hours as needed for nausea or vomiting. 60 tablet 2  . traZODone (DESYREL) 150 MG tablet Take 150 mg by mouth at bedtime as needed for sleep.    . Vitamin D, Ergocalciferol, (DRISDOL) 1.25 MG (50000 UT) CAPS capsule Take 50,000 Units by mouth every 7 (seven) days.    . [DISCONTINUED] levETIRAcetam (KEPPRA) 500 MG tablet Take 1 tablet (500 mg total) by mouth 2 (two) times daily. 60 tablet 1  . [DISCONTINUED] metoprolol tartrate (LOPRESSOR) 25 MG tablet Take 0.5 tablets (12.5 mg total) by mouth 2 (two) times daily. 60 tablet 1  . [DISCONTINUED] venlafaxine XR (EFFEXOR-XR) 75 MG 24 hr capsule Take 75 mg by mouth at bedtime.   3   Social History   Socioeconomic History  . Marital status: Married    Spouse name: Not on file  . Number of children: 3  . 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 Highest education level: Not on file  Occupational History  . Not on file  Tobacco Use  . Smoking status: Former Smoker    Packs/day: 2.00    Years: 3.00    Pack years: 6.00    Types: Cigarettes    Quit date: 2006    Years since quitting: 15.0  . Smokeless tobacco: Never Used  Substance and Sexual Activity  . Alcohol use: Yes    Comment: socially  . Drug use: No  . Sexual activity: Yes    Birth control/protection: None  Other Topics Concern  . Not on file  Social History Narrative   Lives at home with husband and kids   Right handed   Caffeine: 6 cups daily   Social Determinants of Health   Financial Resource Strain:   . Difficulty of Paying Living Expenses: Not on file  Food Insecurity:   . Worried About Programme researcher, broadcasting/film/video in the Last Year: Not on file  . Ran Out of Food in the Last Year: Not on file  Transportation Needs:   . Lack of Transportation (Medical): Not on file  . Lack of Transportation (Non-Medical): Not on file  Physical Activity:   . Days of Exercise per  Week: Not on file  . Minutes of Exercise per Session: Not on file  Stress:   . Feeling of Stress : Not on file  Social Connections:   . Frequency of Communication with Friends and Family: Not on file  . Frequency of Social Gatherings with Friends and Family: Not on file  . Attends Religious Services: Not on file  . Active Member of Clubs or Organizations: Not on file  . Attends Banker Meetings: Not on file  . Marital Status: Not on file  Intimate Partner Violence:   .  Fear of Current or Ex-Partner: Not on file  . Emotionally Abused: Not on file  . Physically Abused: Not on file  . Sexually Abused: Not on file   Family History  Problem Relation Age of Onset  . Diabetes Mellitus II Mother   . Hypertension Mother   . Seizures Mother   . Other Mother        post stroke headaches  . Stroke Mother   . Hypertension Father   . Diabetes Mellitus II Father   . Hyperlipidemia Father   . Bipolar disorder Sister   . Asthma Son   . Autism Son   . Tourette syndrome Son   . Autism Daughter        level 3  . Other Daughter        tibia torsion & femoral aniversion    OBJECTIVE:  Vitals:   07/12/19 0820  BP: 135/81  Pulse: (!) 114  Resp: 18  Temp: 98.2 F (36.8 C)  TempSrc: Oral  SpO2: 95%   HR: Rechecked 110 BPM  General appearance: Alert in no acute distress HEENT: NCAT.  Oropharynx clear.  Lungs: clear to auscultation bilaterally without adventitious breath sounds Heart: regular rate and rhythm.   Abdomen: soft; non-distended; no tenderness; bowel sounds present; no guarding Back: Mild RT CVA tenderness Extremities: no edema; symmetrical with no gross deformities Skin: warm and dry Neurologic: Ambulates from chair to exam table without difficulty Psychological: alert and cooperative; normal mood and affect  LABS:   Results for orders placed or performed during the hospital encounter of 07/12/19 (from the past 24 hour(s))  POCT urinalysis dipstick      Status: Abnormal   Collection Time: 07/12/19  8:32 AM  Result Value Ref Range   Color, UA orange (A) yellow   Clarity, UA cloudy (A) clear   Glucose, UA negative negative mg/dL   Bilirubin, UA small (A) negative   Ketones, POC UA trace (5) (A) negative mg/dL   Spec Grav, UA >=2.585 (A) 1.010 - 1.025   Blood, UA large (A) negative   pH, UA 5.5 5.0 - 8.0   Protein Ur, POC =100 (A) negative mg/dL   Urobilinogen, UA 0.2 0.2 or 1.0 E.U./dL   Nitrite, UA Negative Negative   Leukocytes, UA Small (1+) (A) Negative  POCT urine pregnancy     Status: None   Collection Time: 07/12/19  8:32 AM  Result Value Ref Range   Preg Test, Ur Negative Negative     ASSESSMENT & PLAN:  1. Acute cystitis with hematuria     Meds ordered this encounter  Medications  . cephALEXin (KEFLEX) 500 MG capsule    Sig: Take 1 capsule (500 mg total) by mouth 2 (two) times daily for 10 days.    Dispense:  20 capsule    Refill:  0    Order Specific Question:   Supervising Provider    Answer:   Eustace Moore [[id number]]  . phenazopyridine (PYRIDIUM) 200 MG tablet    Sig: Take 1 tablet (200 mg total) by mouth 3 (three) times daily.    Dispense:  6 tablet    Refill:  0    Order Specific Question:   Supervising Provider    Answer:   Eustace Moore [[id number]]  . fluconazole (DIFLUCAN) 200 MG tablet    Sig: Take one dose by mouth, wait 72 hours, and then take second dose by mouth    Dispense:  2 tablet    Refill:  0    Order Specific Question:   Supervising Provider    Answer:   Raylene Everts [[id number]]   Urine showed signs of infection Urine culture sent.  We will call you with the results.   Push fluids and get plenty of rest.   Take antibiotic as directed and to completion Take pyridium as prescribed and as needed for symptomatic relief Follow up with PCP if symptoms persists Return here or go to ER if you have any new or worsening symptoms such as fever, worsening abdominal pain,  nausea/vomiting, flank pain, etc...  Outlined signs and symptoms indicating need for more acute intervention. Patient verbalized understanding. After Visit Summary given.     Stacey Drain [place], PA-C 07/12/19 [phone]

## 2019-07-12 NOTE — Discharge Instructions (Signed)
Urine showed signs of infection °Urine culture sent.  We will call you with the results.   °Push fluids and get plenty of rest.   °Take antibiotic as directed and to completion °Take pyridium as prescribed and as needed for symptomatic relief °Follow up with PCP if symptoms persists °Return here or go to ER if you have any new or worsening symptoms such as fever, worsening abdominal pain, nausea/vomiting, flank pain, etc... °

## 2019-07-14 LAB — URINE CULTURE

## 2019-08-26 ENCOUNTER — Emergency Department (HOSPITAL_COMMUNITY)
Admission: EM | Admit: 2019-08-26 | Discharge: 2019-08-26 | Discharge status: Against Medical Advice | Payer: BLUE CROSS/BLUE SHIELD | Attending: Emergency Medicine | Admitting: Emergency Medicine

## 2019-08-26 ENCOUNTER — Other Ambulatory Visit: Payer: Self-pay

## 2019-08-26 DIAGNOSIS — M79661 Pain in right lower leg: Secondary | ICD-10-CM | POA: Insufficient documentation

## 2019-08-26 DIAGNOSIS — Z5321 Procedure and treatment not carried out due to patient leaving prior to being seen by health care provider: Secondary | ICD-10-CM | POA: Insufficient documentation

## 2019-08-26 NOTE — ED Triage Notes (Signed)
Pt c/o right leg pain with sob and seen pcp for the same Friday. She was started on lasix for leg swelling, not feeling better so pcp told.

## 2019-08-26 NOTE — ED Notes (Signed)
Pt asked if we have ultrasound 24/7 and I told her that if the doctor ordered one on her leg that she would have to come back in am. Pt states all her doctors are at [facility] so she is going to leave this facility and go there.

## 2019-08-27 ENCOUNTER — Encounter (HOSPITAL_COMMUNITY): Payer: Self-pay | Admitting: *Deleted

## 2019-08-27 ENCOUNTER — Emergency Department (HOSPITAL_COMMUNITY)
Admission: EM | Admit: 2019-08-27 | Discharge: 2019-08-27 | Disposition: A | Discharge status: Home / Self Care | Payer: BLUE CROSS/BLUE SHIELD | Attending: Emergency Medicine | Admitting: Emergency Medicine

## 2019-08-27 ENCOUNTER — Emergency Department (HOSPITAL_COMMUNITY): Payer: BLUE CROSS/BLUE SHIELD

## 2019-08-27 DIAGNOSIS — Z87891 Personal history of nicotine dependence: Secondary | ICD-10-CM | POA: Insufficient documentation

## 2019-08-27 DIAGNOSIS — E119 Type 2 diabetes mellitus without complications: Secondary | ICD-10-CM | POA: Diagnosis not present

## 2019-08-27 DIAGNOSIS — E039 Hypothyroidism, unspecified: Secondary | ICD-10-CM | POA: Diagnosis not present

## 2019-08-27 DIAGNOSIS — Z7984 Long term (current) use of oral hypoglycemic drugs: Secondary | ICD-10-CM | POA: Insufficient documentation

## 2019-08-27 DIAGNOSIS — I10 Essential (primary) hypertension: Secondary | ICD-10-CM | POA: Insufficient documentation

## 2019-08-27 DIAGNOSIS — M79604 Pain in right leg: Secondary | ICD-10-CM | POA: Diagnosis present

## 2019-08-27 LAB — BASIC METABOLIC PANEL
Anion gap: 10 (ref 5–15)
BUN: 7 mg/dL (ref 6–20)
CO2: 26 mmol/L (ref 22–32)
Calcium: 8.5 mg/dL — ABNORMAL LOW (ref 8.9–10.3)
Chloride: 98 mmol/L (ref 98–111)
Creatinine, Ser: 0.83 mg/dL (ref 0.44–1.00)
GFR calc Af Amer: >60 mL/min (ref >60)
GFR calc non Af Amer: >60 mL/min (ref >60)
Glucose, Bld: 167 mg/dL — ABNORMAL HIGH (ref 70–99)
Potassium: 3.7 mmol/L (ref 3.5–5.1)
Sodium: 134 mmol/L — ABNORMAL LOW (ref 135–145)

## 2019-08-27 LAB — CBC WITH DIFFERENTIAL/PLATELET
Abs Immature Granulocytes: 0.03 10*3/uL (ref 0.00–0.07)
Basophils Absolute: 0.1 10*3/uL (ref 0.0–0.1)
Basophils Relative: 1 %
Eosinophils Absolute: 0.2 10*3/uL (ref 0.0–0.5)
Eosinophils Relative: 3 %
HCT: 37.1 % (ref 36.0–46.0)
Hemoglobin: 11.7 g/dL — ABNORMAL LOW (ref 12.0–15.0)
Immature Granulocytes: 0 %
Lymphocytes Relative: 39 %
Lymphs Abs: 3.1 10*3/uL (ref 0.7–4.0)
MCH: 28.5 pg (ref 26.0–34.0)
MCHC: 31.5 g/dL (ref 30.0–36.0)
MCV: 90.3 fL (ref 80.0–100.0)
Monocytes Absolute: 0.3 10*3/uL (ref 0.1–1.0)
Monocytes Relative: 4 %
Neutro Abs: 4.2 10*3/uL (ref 1.7–7.7)
Neutrophils Relative %: 53 %
Platelets: 313 10*3/uL (ref 150–400)
RBC: 4.11 MIL/uL (ref 3.87–5.11)
RDW: 14.6 % (ref 11.5–15.5)
WBC: 8 10*3/uL (ref 4.0–10.5)
nRBC: 0 % (ref 0.0–0.2)

## 2019-08-27 MED ORDER — METHYLPREDNISOLONE SODIUM SUCC 125 MG IJ SOLR
125.0000 mg | Freq: Once | INTRAMUSCULAR | Status: AC
  Administered 2019-08-27: 14:00:00 125 mg via INTRAMUSCULAR
  Filled 2019-08-27: qty 2

## 2019-08-27 MED ORDER — KETOROLAC TROMETHAMINE 60 MG/2ML IM SOLN
60.0000 mg | Freq: Once | INTRAMUSCULAR | Status: AC
  Administered 2019-08-27: 15:00:00 60 mg via INTRAMUSCULAR
  Filled 2019-08-27: qty 2

## 2019-08-27 NOTE — ED Notes (Signed)
US in progress

## 2019-08-27 NOTE — Discharge Instructions (Addendum)
See your Physician as scheduled.  Return if any problems.

## 2019-08-27 NOTE — ED Triage Notes (Signed)
Pain in right lower leg, sent here for evaluation by her PCP
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
## 2019-10-07 ENCOUNTER — Ambulatory Visit
Admission: EM | Admit: 2019-10-07 | Discharge: 2019-10-07 | Disposition: A | Discharge status: Home / Self Care | Payer: BC Managed Care – PPO | Attending: Emergency Medicine | Admitting: Emergency Medicine

## 2019-10-07 DIAGNOSIS — L0201 Cutaneous abscess of face: Secondary | ICD-10-CM | POA: Diagnosis not present

## 2019-10-07 MED ORDER — CEPHALEXIN 250 MG PO CAPS: 250.0000 mg | ORAL_CAPSULE | Freq: Four times a day (QID) | ORAL | 0 refills | Status: DC

## 2019-10-07 MED ORDER — FLUCONAZOLE 150 MG PO TABS: 150.0000 mg | ORAL_TABLET | Freq: Every day | ORAL | 0 refills | Status: DC

## 2019-10-07 NOTE — ED Triage Notes (Signed)
Pt seen by provider prior to RN, see provider note  

## 2019-10-07 NOTE — ED Provider Notes (Signed)
[facility]    CSN: [id number] Arrival date & time: 10/07/19  1716      History   Chief Complaint abscess  HPI Sonya Gibson is a 34 y.o. female.   Who presented to the urgent care with a complaint of abscess over right for the past 2 to 3 days.  Report redness and denied drainage.  Denies any precipitating event.  Denies any alleviating or worsening factor.  Has not used any medication.  Nothing make her symptoms worse.  Denies chills, fever, nausea, vomiting, diarrhea, chest pain, chest tightness, confusion.       Past Medical History:  Diagnosis Date  . Anxiety   . Chronic back pain   . Depression   . Diabetes ([place])   . Drug-seeking behavior   . Essential hypertension   . GERD (gastroesophageal reflux disease)   . Hypothyroidism   . Lumbar radiculopathy, right   . Migraine headache   . PTSD (post-traumatic stress disorder)     Patient Active Problem List   Diagnosis Date Noted  . Morbid obesity with BMI of 60.0-69.9, adult ([place]) 07/10/2018  . Headache due to low cerebrospinal fluid pressure 07/09/2018  . Postoperative CSF leak 07/09/2018  . 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 DM (diabetes mellitus) ([place]) 07/08/2018  . Headache 07/02/2018  . Vaginal bleeding 07/02/2018  . CSF leak 12/20/2017  . Pressure injury of skin 12/20/2017  . Lumbar radiculopathy   . S/P lumbar laminectomy 12/05/2017  . Perineal numbness 11/30/2017  . Weakness of right lower extremity 11/17/2017  . Lower back pain 11/15/2017  . Hypothyroidism   . Anxiety   . Essential hypertension   . PTSD (post-traumatic stress disorder)   . Decreased sensation of leg   . GESTATIONAL DIABETES 04/26/2010    Past Surgical History:  Procedure Laterality Date  . ANTERIOR CRUCIATE LIGAMENT REPAIR Left   . BACK SURGERY  12/05/2017  . CESAREAN SECTION    . IR EPIDUROGRAPHY  11/17/2017  . KNEE SURGERY    . LUMBAR LAMINECTOMY/DECOMPRESSION MICRODISCECTOMY Right 12/05/2017    Procedure: Right L4-5 Microdiskectomy;  Surgeon: Eustace Moore, MD;  Location: [place];  Service: Neurosurgery;  Laterality: Right;  . LUMBAR LAMINECTOMY/DECOMPRESSION MICRODISCECTOMY N/A 12/20/2017   Procedure: Repair of CSF Leak;  Surgeon: Eustace Moore, MD;  Location: [place];  Service: Neurosurgery;  Laterality: N/A;  . LUMBAR WOUND DEBRIDEMENT N/A 12/27/2017   Procedure: Re-exploration for CSF leak repair and placement of lumbar drain;  Surgeon: Eustace Moore, MD;  Location: [place];  Service: Neurosurgery;  Laterality: N/A;  . middle finger reatachment    . PLACEMENT OF LUMBAR DRAIN N/A 12/27/2017   Procedure: PLACEMENT OF LUMBAR DRAIN;  Surgeon: Eustace Moore, MD;  Location: [place];  Service: Neurosurgery;  Laterality: N/A;  . TONSILLECTOMY AND ADENOIDECTOMY  1996  . VENTRICULOPERITONEAL SHUNT N/A 01/08/2018   Procedure: Re-exploration and repair of previous Lumbar cerebro-spinal fluid leak and placement of Lumbar drain;  Surgeon: Eustace Moore, MD;  Location: [place];  Service: Neurosurgery;  Laterality: N/A;    OB History    Gravida  1   Para      Term      Preterm      AB      Living        SAB      TAB      Ectopic      Multiple      Live Births  Home Medications    Prior to Admission medications   Medication Sig Start Date End Date Taking? Authorizing Provider  acetaminophen (TYLENOL) 325 MG tablet Take 650 mg by mouth every 6 (six) hours as needed.    [provider]  albuterol (VENTOLIN HFA) 108 (90 Base) MCG/ACT inhaler Inhale 2 puffs into the lungs every 6 (six) hours as needed for wheezing or shortness of breath.    [provider]  Butalbital-APAP-Caffeine 50-300-40 MG CAPS Take 1 capsule by mouth every 4 (four) hours as needed (headache).  12/19/17   [provider]  calcium carbonate (TUMS EX) 750 MG chewable tablet Chew 2 tablets by mouth as needed for heartburn.    [provider]  cephALEXin (KEFLEX)  250 MG capsule Take 1 capsule (250 mg total) by mouth 4 (four) times daily. 10/07/19   Sky Primo, Zachery Dakins, FNP  cyanocobalamin (,VITAMIN B-12,) 1000 MCG/ML injection Inject 1,000 mcg into the muscle See admin instructions. Every two weeks 02/16/18   [provider]  DULoxetine (CYMBALTA) 60 MG capsule Take 120 mg by mouth daily.    [provider]  fluconazole (DIFLUCAN) 150 MG tablet Take 1 tablet (150 mg total) by mouth daily. Take second dose 72 hours after the first if symptom does not resolve 10/07/19   Jolynne Spurgin, Zachery Dakins, FNP  gabapentin (NEURONTIN) 600 MG tablet Take 600 mg by mouth 3 (three) times daily.    [provider]  ibuprofen (ADVIL,MOTRIN) 200 MG tablet Take 400 mg by mouth every 6 (six) hours as needed.    [provider]  Melatonin 10 MG TABS Take 10 mg by mouth daily as needed (sleep).    [provider]  metFORMIN (GLUCOPHAGE) 500 MG tablet Take 1 tablet by mouth 2 (two) times daily with a meal.  09/25/17 09/20/18  [provider]  phenazopyridine (PYRIDIUM) 200 MG tablet Take 1 tablet (200 mg total) by mouth 3 (three) times daily. 07/12/19   Wurst, [place], PA-C  Prenatal Vit-Fe Fumarate-FA (PRENATAL MULTIVITAMIN) TABS tablet Take 2 tablets by mouth Nightly.    [provider]  promethazine (PHENERGAN) 25 MG tablet Take 1 tablet (25 mg total) by mouth every 6 (six) hours as needed for nausea or vomiting. 07/06/18   Anson Fret, MD  traZODone (DESYREL) 150 MG tablet Take 150 mg by mouth at bedtime as needed for sleep.    [provider]  Vitamin D, Ergocalciferol, (DRISDOL) 1.25 MG (50000 UT) CAPS capsule Take 50,000 Units by mouth every 7 (seven) days.    [provider]  levETIRAcetam (KEPPRA) 500 MG tablet Take 1 tablet (500 mg total) by mouth 2 (two) times daily. 07/10/18 07/12/19  Catarina Hartshorn, MD  metoprolol tartrate (LOPRESSOR) 25 MG tablet Take 0.5 tablets (12.5 mg total) by mouth 2 (two) times daily.  07/10/18 06/02/19  Catarina Hartshorn, MD  venlafaxine XR (EFFEXOR-XR) 75 MG 24 hr capsule Take 75 mg by mouth at bedtime.  12/12/17 06/02/19  [provider]    Family History Family History  Problem Relation Age of Onset  . Diabetes Mellitus II Mother   . Hypertension Mother   . Seizures Mother   . Other Mother        post stroke headaches  . Stroke Mother   . Hypertension Father   . Diabetes Mellitus II Father   . Hyperlipidemia Father   . Bipolar disorder Sister   . Asthma Son   . Autism Son   . Tourette syndrome  Son   . Autism Daughter        level 3  . Other Daughter        tibia torsion & femoral aniversion    Social History Social History   Tobacco Use  . Smoking status: Former Smoker    Packs/day: 2.00    Years: 3.00    Pack years: 6.00    Types: Cigarettes    Quit date: 2006    Years since quitting: 15.3  . Smokeless tobacco: Never Used  Substance Use Topics  . Alcohol use: Yes    Comment: socially  . Drug use: No     Allergies   Cinnamon, Coconut oil, Lovenox [enoxaparin], Nitrofurantoin, and Other   Review of Systems Review of Systems  Constitutional: Negative.   Respiratory: Negative.   Cardiovascular: Negative.   Skin: Positive for color change.  All other systems reviewed and are negative.    Physical Exam Triage Vital Signs ED Triage Vitals [10/07/19 1725]  Enc Vitals Group     BP 111/81     Pulse Rate 87     Resp 17     Temp 97.8 F (36.6 C)     Temp Source Oral     SpO2 97 %     Weight      Height      Head Circumference      Peak Flow      Pain Score      Pain Loc      Pain Edu?      Excl. in GC?    No data found.  Updated Vital Signs BP 111/81 (BP Location: Right Arm)   Pulse 87   Temp 97.8 F (36.6 C) (Oral)   Resp 17   SpO2 97%   Visual Acuity Right Eye Distance:   Left Eye Distance:   Bilateral Distance:    Right Eye Near:   Left Eye Near:    Bilateral Near:     Physical Exam Vitals and nursing  note reviewed.  Constitutional:      General: She is not in acute distress.    Appearance: Normal appearance. She is normal weight. She is not ill-appearing, toxic-appearing or diaphoretic.  Cardiovascular:     Rate and Rhythm: Normal rate and regular rhythm.     Pulses: Normal pulses.     Heart sounds: Normal heart sounds. No murmur. No friction rub. No gallop.   Pulmonary:     Effort: Pulmonary effort is normal. No respiratory distress.     Breath sounds: Normal breath sounds. No stridor. No wheezing, rhonchi or rales.  Chest:     Chest wall: No tenderness.  Skin:    General: Skin is warm.     Findings: Abscess and erythema present. No rash.  Neurological:     Mental Status: She is alert.      UC Treatments / Results  Labs (all labs ordered are listed, but only abnormal results are displayed) Labs Reviewed - No data to display  EKG   Radiology No results found.  Procedures Procedures (including critical care time)  Medications Ordered in UC Medications - No data to display  Initial Impression / Assessment and Plan / UC Course  I have reviewed the triage vital signs and the nursing notes.  Pertinent labs & imaging results that were available during my care of the patient were reviewed by me and considered in my medical decision making (see chart for details).    Patient  is stable at discharge.  Keflex was prescribed and patient was advised to return if abscess started to drain.  Diflucan was prescribed to prevent yeast infection after completing present antibiotic.  Follow-up with PCP.  Final Clinical Impressions(s) / UC Diagnoses   Final diagnoses:  Cutaneous abscess of face     Discharge Instructions     Apply warm compresses 3-4x daily for 10-15 minutes Take antibiotic as prescribed and to completion Follow up here or with PCP if symptoms persists Return or go to the ED if you have any new or worsening symptoms     ED Prescriptions    Medication Sig  Dispense Auth. Provider   cephALEXin (KEFLEX) 250 MG capsule Take 1 capsule (250 mg total) by mouth 4 (four) times daily. 28 capsule Lourdes Kucharski S, FNP   fluconazole (DIFLUCAN) 150 MG tablet Take 1 tablet (150 mg total) by mouth daily. Take second dose 72 hours after the first if symptom does not resolve 2 tablet Kalecia Hartney, Zachery Dakins, FNP     PDMP not reviewed this encounter.   Durward Parcel, FNP 10/07/19 1744

## 2019-10-07 NOTE — Discharge Instructions (Addendum)
Apply warm compresses 3-4x daily for 10-15 minutes Take antibiotic as prescribed and to completion Follow up here or with PCP if symptoms persists Return or go to the ED if you have any new or worsening symptoms

## 2019-10-31 ENCOUNTER — Other Ambulatory Visit: Payer: Self-pay

## 2019-10-31 ENCOUNTER — Ambulatory Visit
Admission: EM | Admit: 2019-10-31 | Discharge: 2019-10-31 | Disposition: A | Discharge status: Home / Self Care | Payer: BC Managed Care – PPO | Attending: Emergency Medicine | Admitting: Emergency Medicine

## 2019-10-31 DIAGNOSIS — N390 Urinary tract infection, site not specified: Secondary | ICD-10-CM | POA: Diagnosis present

## 2019-10-31 LAB — POCT URINALYSIS DIP (MANUAL ENTRY)
Blood, UA: NEGATIVE
Glucose, UA: 100 mg/dL — AB
Ketones, POC UA: NEGATIVE mg/dL
Leukocytes, UA: NEGATIVE
Nitrite, UA: POSITIVE — AB
Protein Ur, POC: NEGATIVE mg/dL
Spec Grav, UA: 1.025 (ref 1.010–1.025)
Urobilinogen, UA: 1 E.U./dL
pH, UA: 6 (ref 5.0–8.0)

## 2019-10-31 MED ORDER — SULFAMETHOXAZOLE-TRIMETHOPRIM 800-160 MG PO TABS: 1.0000 | ORAL_TABLET | Freq: Two times a day (BID) | ORAL | 0 refills | Status: AC

## 2019-10-31 MED ORDER — PHENAZOPYRIDINE HCL 100 MG PO TABS: 100.0000 mg | ORAL_TABLET | Freq: Three times a day (TID) | ORAL | 0 refills | Status: DC | PRN

## 2019-10-31 NOTE — ED Triage Notes (Signed)
Pt has c/o painful urination since Monday

## 2019-10-31 NOTE — ED Provider Notes (Addendum)
[facility]   CC: Painful urination  SUBJECTIVE:  Sonya Gibson is a 34 y.o. female who complains of urinary frequency, urgency and dysuria for the past 3 days.  Patient denies a precipitating event, recent sexual encounter, excessive caffeine intake.  Has tried OTC medications without relief.  Symptoms are made worse with urination.  Admits to similar symptoms in the past and was treated with Keflex.  Denies fever, chills, nausea, vomiting, abdominal pain, flank pain, abnormal vaginal discharge or bleeding, hematuria.    LMP: Patient's last menstrual period was 08/14/2019.  ROS: As in HPI.  All other pertinent ROS negative.     Past Medical History:  Diagnosis Date  . Anxiety   . Chronic back pain   . Depression   . Diabetes ([place])   . Drug-seeking behavior   . Essential hypertension   . GERD (gastroesophageal reflux disease)   . Hypothyroidism   . Lumbar radiculopathy, right   . Migraine headache   . PTSD (post-traumatic stress disorder)    Past Surgical History:  Procedure Laterality Date  . ANTERIOR CRUCIATE LIGAMENT REPAIR Left   . BACK SURGERY  12/05/2017  . CESAREAN SECTION    . IR EPIDUROGRAPHY  11/17/2017  . KNEE SURGERY    . LUMBAR LAMINECTOMY/DECOMPRESSION MICRODISCECTOMY Right 12/05/2017   Procedure: Right L4-5 Microdiskectomy;  Surgeon: Eustace Moore, MD;  Location: [place];  Service: Neurosurgery;  Laterality: Right;  . LUMBAR LAMINECTOMY/DECOMPRESSION MICRODISCECTOMY N/A 12/20/2017   Procedure: Repair of CSF Leak;  Surgeon: Eustace Moore, MD;  Location: [place];  Service: Neurosurgery;  Laterality: N/A;  . LUMBAR WOUND DEBRIDEMENT N/A 12/27/2017   Procedure: Re-exploration for CSF leak repair and placement of lumbar drain;  Surgeon: Eustace Moore, MD;  Location: [place];  Service: Neurosurgery;  Laterality: N/A;  . middle finger reatachment    . PLACEMENT OF LUMBAR DRAIN N/A 12/27/2017   Procedure: PLACEMENT OF LUMBAR DRAIN;  Surgeon: Eustace Moore, MD;   Location: [place];  Service: Neurosurgery;  Laterality: N/A;  . TONSILLECTOMY AND ADENOIDECTOMY  1996  . VENTRICULOPERITONEAL SHUNT N/A 01/08/2018   Procedure: Re-exploration and repair of previous Lumbar cerebro-spinal fluid leak and placement of Lumbar drain;  Surgeon: Eustace Moore, MD;  Location: [place];  Service: Neurosurgery;  Laterality: N/A;   Allergies  Allergen Reactions  . Cinnamon Anaphylaxis    (NO "REAL"/TREE BARK CINNAMON)  . Coconut Oil Anaphylaxis    Can eat "fake" coconut  . Lovenox [Enoxaparin] Other (See Comments)    BROKE DOWN THE SKIN AT INJECTION SITE AND CAUSED A WOUND  . Nitrofurantoin Hives  . Other     "Headache cocktail"   Blister to abd   No current facility-administered medications on file prior to encounter.   Current Outpatient Medications on File Prior to Encounter  Medication Sig Dispense Refill  . acetaminophen (TYLENOL) 325 MG tablet Take 650 mg by mouth every 6 (six) hours as needed.    Marland Kitchen albuterol (VENTOLIN HFA) 108 (90 Base) MCG/ACT inhaler Inhale 2 puffs into the lungs every 6 (six) hours as needed for wheezing or shortness of breath.    . Butalbital-APAP-Caffeine 50-300-40 MG CAPS Take 1 capsule by mouth every 4 (four) hours as needed (headache).   0  . calcium carbonate (TUMS EX) 750 MG chewable tablet Chew 2 tablets by mouth as needed for heartburn.    . cephALEXin (KEFLEX) 250 MG capsule Take 1 capsule (250 mg total) by mouth 4 (four)  times daily. 28 capsule 0  . cyanocobalamin (,VITAMIN B-12,) 1000 MCG/ML injection Inject 1,000 mcg into the muscle See admin instructions. Every two weeks  5  . DULoxetine (CYMBALTA) 60 MG capsule Take 120 mg by mouth daily.    . fluconazole (DIFLUCAN) 150 MG tablet Take 1 tablet (150 mg total) by mouth daily. Take second dose 72 hours after the first if symptom does not resolve 2 tablet 0  . gabapentin (NEURONTIN) 600 MG tablet Take 600 mg by mouth 3 (three) times daily.    Marland Kitchen ibuprofen (ADVIL,MOTRIN) 200 MG tablet  Take 400 mg by mouth every 6 (six) hours as needed.    . Melatonin 10 MG TABS Take 10 mg by mouth daily as needed (sleep).    . metFORMIN (GLUCOPHAGE) 500 MG tablet Take 1,000 mg by mouth 2 (two) times daily with a meal.     . Prenatal Vit-Fe Fumarate-FA (PRENATAL MULTIVITAMIN) TABS tablet Take 2 tablets by mouth Nightly.    . promethazine (PHENERGAN) 25 MG tablet Take 1 tablet (25 mg total) by mouth every 6 (six) hours as needed for nausea or vomiting. 60 tablet 2  . traZODone (DESYREL) 150 MG tablet Take 150 mg by mouth at bedtime as needed for sleep.    . Vitamin D, Ergocalciferol, (DRISDOL) 1.25 MG (50000 UT) CAPS capsule Take 50,000 Units by mouth every 7 (seven) days.    . [DISCONTINUED] levETIRAcetam (KEPPRA) 500 MG tablet Take 1 tablet (500 mg total) by mouth 2 (two) times daily. 60 tablet 1  . [DISCONTINUED] metoprolol tartrate (LOPRESSOR) 25 MG tablet Take 0.5 tablets (12.5 mg total) by mouth 2 (two) times daily. 60 tablet 1  . [DISCONTINUED] venlafaxine XR (EFFEXOR-XR) 75 MG 24 hr capsule Take 75 mg by mouth at bedtime.   3   Social History   Socioeconomic History  . Marital status: Married    Spouse name: Not on file  . Number of children: 3  . 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 Highest education level: Not on file  Occupational History  . Not on file  Tobacco Use  . Smoking status: Former Smoker    Packs/day: 2.00    Years: 3.00    Pack years: 6.00    Types: Cigarettes    Quit date: 2006    Years since quitting: 15.3  . Smokeless tobacco: Never Used  Substance and Sexual Activity  . Alcohol use: Yes    Comment: socially  . Drug use: No  . Sexual activity: Yes    Birth control/protection: None  Other Topics Concern  . Not on file  Social History Narrative   Lives at home with husband and kids   Right handed   Caffeine: 6 cups daily   Social Determinants of Health   Financial Resource Strain:   . Difficulty of Paying Living Expenses:   Food Insecurity:   .  Worried About Programme researcher, broadcasting/film/video in the Last Year:   . Barista in the Last Year:   Transportation Needs:   . Freight forwarder (Medical):   Marland Kitchen Lack of Transportation (Non-Medical):   Physical Activity:   . Days of Exercise per Week:   . Minutes of Exercise per Session:   Stress:   . Feeling of Stress :   Social Connections:   . Frequency of Communication with Friends and Family:   . Frequency of Social Gatherings with Friends and Family:   . Attends Religious Services:   .  Active Member of Clubs or Organizations:   . Attends Banker Meetings:   Marland Kitchen Marital Status:   Intimate Partner Violence:   . Fear of Current or Ex-Partner:   . Emotionally Abused:   Marland Kitchen Physically Abused:   . Sexually Abused:    Family History  Problem Relation Age of Onset  . Diabetes Mellitus II Mother   . Hypertension Mother   . Seizures Mother   . Other Mother        post stroke headaches  . Stroke Mother   . Hypertension Father   . Diabetes Mellitus II Father   . Hyperlipidemia Father   . Bipolar disorder Sister   . Asthma Son   . Autism Son   . Tourette syndrome Son   . Autism Daughter        level 3  . Other Daughter        tibia torsion & femoral aniversion    OBJECTIVE:  Vitals:   10/31/19 1647  BP: 127/85  Pulse: 87  Resp: 18  Temp: 98 F (36.7 C)  SpO2: 95%   General appearance: AOx3 in no acute distress HEENT: NCAT.  Oropharynx clear.  Lungs: clear to auscultation bilaterally without adventitious breath sounds Heart: regular rate and rhythm.  Radial pulses 2+ symmetrical bilaterally Abdomen: soft; non-distended; no tenderness; bowel sounds present; no guarding or rebound tenderness Back: no CVA tenderness Extremities: no edema; symmetrical with no gross deformities Skin: warm and dry Neurologic: Ambulates from chair to exam table without difficulty Psychological: alert and cooperative; normal mood and affect  Labs Reviewed  POCT URINALYSIS DIP  (MANUAL ENTRY) - Abnormal; Notable for the following components:      Result Value   Glucose, UA =100 (*)    Bilirubin, UA small (*)    Nitrite, UA Positive (*)    All other components within normal limits  URINE CULTURE    ASSESSMENT & PLAN:  1. Lower urinary tract infection    Patient stable at discharge.  POCT urine analysis was positive for nitrite.  We will treat this patient for possible UTI.  Bactrim DS will be prescribed as she is allergic to Macrobid.  Meds ordered this encounter  Medications  . sulfamethoxazole-trimethoprim (BACTRIM DS) 800-160 MG tablet    Sig: Take 1 tablet by mouth 2 (two) times daily for 10 days.    Dispense:  20 tablet    Refill:  0  . phenazopyridine (PYRIDIUM) 100 MG tablet    Sig: Take 1 tablet (100 mg total) by mouth 3 (three) times daily as needed for pain.    Dispense:  10 tablet    Refill:  0   Discharge instructions  Urine culture sent.  We will call you with the results.   Push fluids and get plenty of rest.   Take antibiotic as directed and to completion Take pyridium as prescribed and as needed for symptomatic relief Follow up with PCP if symptoms persists Return here or go to ER if you have any new or worsening symptoms such as fever, worsening abdominal pain, nausea/vomiting, flank pain, etc...  Outlined signs and symptoms indicating need for more acute intervention. Patient verbalized understanding. After Visit Summary given.     Durward Parcel, FNP 10/31/19 1718    Durward Parcel, FNP 10/31/19 1721

## 2019-10-31 NOTE — Discharge Instructions (Signed)
Urine culture sent.  We will call you with the results.   Push fluids and get plenty of rest.   Take antibiotic as directed and to completion Take pyridium as prescribed and as needed for symptomatic relief Follow up with PCP if symptoms persists Return here or go to ER if you have any new or worsening symptoms such as fever, worsening abdominal pain, nausea/vomiting, flank pain, etc... 

## 2019-11-01 LAB — URINE CULTURE: Culture: NO GROWTH

## 2020-01-25 ENCOUNTER — Emergency Department (HOSPITAL_COMMUNITY)
Admission: EM | Admit: 2020-01-25 | Discharge: 2020-01-26 | Disposition: A | Discharge status: Home / Self Care | Payer: BLUE CROSS/BLUE SHIELD | Attending: Emergency Medicine | Admitting: Emergency Medicine

## 2020-01-25 ENCOUNTER — Other Ambulatory Visit: Payer: Self-pay

## 2020-01-25 ENCOUNTER — Emergency Department (HOSPITAL_COMMUNITY): Payer: BLUE CROSS/BLUE SHIELD

## 2020-01-25 ENCOUNTER — Encounter (HOSPITAL_COMMUNITY): Payer: Self-pay | Admitting: Emergency Medicine

## 2020-01-25 DIAGNOSIS — R0602 Shortness of breath: Secondary | ICD-10-CM | POA: Insufficient documentation

## 2020-01-25 DIAGNOSIS — J029 Acute pharyngitis, unspecified: Secondary | ICD-10-CM | POA: Insufficient documentation

## 2020-01-25 DIAGNOSIS — R519 Headache, unspecified: Secondary | ICD-10-CM | POA: Insufficient documentation

## 2020-01-25 DIAGNOSIS — R05 Cough: Secondary | ICD-10-CM | POA: Diagnosis not present

## 2020-01-25 DIAGNOSIS — M791 Myalgia, unspecified site: Secondary | ICD-10-CM | POA: Diagnosis not present

## 2020-01-25 DIAGNOSIS — Z7984 Long term (current) use of oral hypoglycemic drugs: Secondary | ICD-10-CM | POA: Diagnosis not present

## 2020-01-25 DIAGNOSIS — U071 COVID-19: Secondary | ICD-10-CM

## 2020-01-25 DIAGNOSIS — E039 Hypothyroidism, unspecified: Secondary | ICD-10-CM | POA: Diagnosis not present

## 2020-01-25 DIAGNOSIS — Z87891 Personal history of nicotine dependence: Secondary | ICD-10-CM | POA: Insufficient documentation

## 2020-01-25 DIAGNOSIS — E119 Type 2 diabetes mellitus without complications: Secondary | ICD-10-CM | POA: Insufficient documentation

## 2020-01-25 DIAGNOSIS — I10 Essential (primary) hypertension: Secondary | ICD-10-CM | POA: Insufficient documentation

## 2020-01-25 DIAGNOSIS — Z20822 Contact with and (suspected) exposure to covid-19: Secondary | ICD-10-CM | POA: Insufficient documentation

## 2020-01-25 NOTE — ED Triage Notes (Signed)
Pt states she has not been feeling good for 1 week. Today took a home COVID test that came back positive.

## 2020-01-26 LAB — SARS CORONAVIRUS 2 BY RT PCR (HOSPITAL ORDER, PERFORMED IN ~~LOC~~ HOSPITAL LAB): SARS Coronavirus 2: NEGATIVE

## 2020-01-26 MED ORDER — AZITHROMYCIN 250 MG PO TABS: ORAL_TABLET | ORAL | 0 refills | Status: DC

## 2020-01-26 MED ORDER — PREDNISONE 20 MG PO TABS: ORAL_TABLET | ORAL | 0 refills | Status: DC

## 2020-01-26 NOTE — ED Provider Notes (Signed)
[facility] EMERGENCY DEPARTMENT Provider Note   CSN: [id number] Arrival date & time: 01/25/20  2011   Time seen 1:30 AM  History Chief Complaint  Patient presents with   Shortness of Breath    Sonya Gibson is a 34 y.o. female.  HPI   Patient states she started getting symptoms on August 3.  She describes headache, dry cough, sore throat, body aches, PND and DOE.  She states she has been having fevers and her highest fever was on August 5 when it was 102.8.  She denies rhinorrhea but states her nose feels congested.  She has had nausea and vomiting about 4-5 times per day but states her doctor called her in Phenergan and Zofran which has helped.  She also states she is having diarrhea 3-4 times a day.  She denies chest pain and states she only has abdominal pain when she gets cramping just prior to vomiting or diarrhea.  She has lost her sense of taste and smell.  She states she took a home Covid test that was positive.  She was scheduled to take the Covid vaccine this week but she had a low-grade fever and they did not want to do it.  Patient states she has a pulse oximeter and when she exerts herself and get short of breath her oxygen drops to 92%.   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   DM (diabetes mellitus) (HCC) 07/08/2018   Headache 07/02/2018   Vaginal bleeding 07/02/2018   CSF leak 12/20/2017     Pressure injury of skin 12/20/2017   Lumbar radiculopathy    S/P lumbar laminectomy 12/05/2017   Perineal numbness 11/30/2017   Weakness of right lower extremity 11/17/2017   Lower back pain 11/15/2017   Hypothyroidism    Anxiety    Essential hypertension    PTSD (post-traumatic stress disorder)    Decreased sensation of leg    GESTATIONAL DIABETES 04/26/2010    Past Surgical History:  Procedure Laterality Date   ANTERIOR CRUCIATE LIGAMENT REPAIR Left    BACK SURGERY  12/05/2017   CESAREAN SECTION     IR EPIDUROGRAPHY  11/17/2017   KNEE SURGERY     LUMBAR LAMINECTOMY/DECOMPRESSION MICRODISCECTOMY Right 12/05/2017   Procedure: Right L4-5 Microdiskectomy;  Surgeon: Tia Alert, MD;  Location: [facility] OR;  Service: Neurosurgery;  Laterality: Right;   LUMBAR LAMINECTOMY/DECOMPRESSION MICRODISCECTOMY N/A 12/20/2017   Procedure: Repair of CSF Leak;  Surgeon: Tia Alert, MD;  Location: [facility] OR;  Service: Neurosurgery;  Laterality: N/A;   LUMBAR WOUND DEBRIDEMENT N/A 12/27/2017   Procedure: Re-exploration for CSF leak repair and placement of lumbar drain;  Surgeon: Tia Alert, MD;  Location: [facility] OR;  Service: Neurosurgery;  Laterality: N/A;   middle finger reatachment     PLACEMENT OF LUMBAR DRAIN N/A 12/27/2017   Procedure: PLACEMENT OF LUMBAR  DRAIN;  Surgeon: Tia Alert, MD;  Location: [facility] OR;  Service: Neurosurgery;  Laterality: N/A;   TONSILLECTOMY AND ADENOIDECTOMY  1996   VENTRICULOPERITONEAL SHUNT N/A 01/08/2018   Procedure: Re-exploration and repair of previous Lumbar cerebro-spinal fluid leak and placement of Lumbar drain;  Surgeon: Tia Alert, MD;  Location: [facility] OR;  Service: Neurosurgery;  Laterality: N/A;     OB History    Gravida  1   Para      Term      Preterm      AB      Living        SAB      TAB      Ectopic      Multiple      Live Births              Family History  Problem Relation Age of Onset   Diabetes  Mellitus II Mother    Hypertension Mother    Seizures Mother    Other Mother        post stroke headaches   Stroke Mother    Hypertension Father    Diabetes Mellitus II Father    Hyperlipidemia Father    Bipolar disorder Sister    Asthma Son    Autism Son    Tourette syndrome Son    Autism Daughter        level 3   Other Daughter        tibia torsion & femoral aniversion    Social History   Tobacco Use   Smoking status: Former Smoker    Packs/day: 2.00    Years: 3.00    Pack years: 6.00    Types: Cigarettes    Quit date: 2006    Years since quitting: 15.6   Smokeless tobacco: Never Used  Building services engineer Use: Never used  Substance Use Topics   Alcohol use: Yes    Comment: socially   Drug use: No  unemployed Lives with spouse  Home Medications Prior to Admission medications   Medication Sig Start Date End Date Taking? Authorizing Provider  acetaminophen (TYLENOL) 325 MG tablet Take 650 mg by mouth every 6 (six) hours as needed.    [provider]  albuterol (VENTOLIN HFA) 108 (90 Base) MCG/ACT inhaler Inhale 2 puffs into the lungs every 6 (six) hours as needed for wheezing or shortness of breath.    [provider]  azithromycin (ZITHROMAX Z-PAK) 250 MG tablet Take 2 po the first day then once a day for the next 4 days. 01/26/20   Devoria Albe, MD  Butalbital-APAP-Caffeine 50-300-40 MG CAPS Take 1 capsule by mouth every 4 (four) hours as needed (headache).  12/19/17   [provider]  calcium carbonate (TUMS EX) 750 MG chewable tablet Chew 2 tablets by mouth as needed for heartburn.    [provider]  cephALEXin (KEFLEX) 250 MG capsule Take 1 capsule (250 mg total) by mouth 4 (four) times daily. 10/07/19   Avegno, Zachery Dakins, FNP  cyanocobalamin (,VITAMIN B-12,) 1000 MCG/ML injection Inject 1,000 mcg into the muscle See admin instructions. Every two weeks 02/16/18   [provider]  DULoxetine (CYMBALTA) 60 MG  capsule Take 120 mg by mouth daily.    [provider]  fluconazole (DIFLUCAN) 150 MG tablet Take 1 tablet (150 mg total) by mouth daily. Take second dose 72 hours after the first if symptom does not resolve 10/07/19   Avegno,  Zachery DakinsKomlanvi S, FNP  gabapentin (NEURONTIN) 600 MG tablet Take 600 mg by mouth 3 (three) times daily.    [provider]  ibuprofen (ADVIL,MOTRIN) 200 MG tablet Take 400 mg by mouth every 6 (six) hours as needed.    [provider]  Melatonin 10 MG TABS Take 10 mg by mouth daily as needed (sleep).    [provider]  metFORMIN (GLUCOPHAGE) 500 MG tablet Take 1,000 mg by mouth 2 (two) times daily with a meal.  09/25/17 09/20/18  [provider]  phenazopyridine (PYRIDIUM) 100 MG tablet Take 1 tablet (100 mg total) by mouth 3 (three) times daily as needed for pain. 10/31/19   Avegno, Zachery DakinsKomlanvi S, FNP  predniSONE (DELTASONE) 20 MG tablet Take 3 po QD x 3d , then 2 po QD x 3d then 1 po QD x 3d 01/26/20   Devoria AlbeKnapp, Jony Ladnier, MD  Prenatal Vit-Fe Fumarate-FA (PRENATAL MULTIVITAMIN) TABS tablet Take 2 tablets by mouth Nightly.    [provider]  promethazine (PHENERGAN) 25 MG tablet Take 1 tablet (25 mg total) by mouth every 6 (six) hours as needed for nausea or vomiting. 07/06/18   Anson FretAhern, Antonia B, MD  traZODone (DESYREL) 150 MG tablet Take 150 mg by mouth at bedtime as needed for sleep.    [provider]  Vitamin D, Ergocalciferol, (DRISDOL) 1.25 MG (50000 UT) CAPS capsule Take 50,000 Units by mouth every 7 (seven) days.    [provider]  levETIRAcetam (KEPPRA) 500 MG tablet Take 1 tablet (500 mg total) by mouth 2 (two) times daily. 07/10/18 07/12/19  Catarina Hartshornat, David, MD  metoprolol tartrate (LOPRESSOR) 25 MG tablet Take 0.5 tablets (12.5 mg total) by mouth 2 (two) times daily. 07/10/18 06/02/19  Catarina Hartshornat, David, MD  venlafaxine XR (EFFEXOR-XR) 75 MG 24 hr capsule Take 75 mg by mouth at bedtime.  12/12/17 06/02/19  [provider]     Allergies    Cinnamon, Coconut oil, Lovenox [enoxaparin], Nitrofurantoin, and Other  Review of Systems   Review of Systems  All other systems reviewed and are negative.   Physical Exam Updated Vital Signs BP (!) 136/97 (BP Location: Right Arm)    Pulse 79    Temp 98.7 F (37.1 C) (Oral)    Resp 20    Ht 5\' 2"  (1.575 m)    Wt 135.6 kg    SpO2 99%    BMI 54.69 kg/m   Physical Exam Vitals and nursing note reviewed.  Constitutional:      Appearance: Normal appearance. She is obese.  HENT:     Head: Normocephalic and atraumatic.     Right Ear: External ear normal.     Left Ear: External ear normal.     Nose: Nose normal.  Eyes:     Extraocular Movements: Extraocular movements intact.     Conjunctiva/sclera: Conjunctivae normal.     Pupils: Pupils are equal, round, and reactive to light.  Cardiovascular:     Rate and Rhythm: Normal rate and regular rhythm.     Pulses: Normal pulses.     Heart sounds: Normal heart sounds.  Pulmonary:     Effort: Pulmonary effort is normal. No respiratory distress.     Breath sounds: Normal breath sounds. No wheezing.  Musculoskeletal:        General: Normal range of motion.     Cervical back: Normal range of motion.  Skin:    General: Skin is warm and dry.  Neurological:     General:  No focal deficit present.     Mental Status: She is alert and oriented to person, place, and time.     Cranial Nerves: No cranial nerve deficit.  Psychiatric:        Mood and Affect: Mood normal.        Behavior: Behavior normal.        Thought Content: Thought content normal.     ED Results / Procedures / Treatments   Labs (all labs ordered are listed, but only abnormal results are displayed) Results for orders placed or performed during the hospital encounter of 01/25/20  SARS Coronavirus 2 by RT PCR (hospital order, performed in [facility] hospital lab) Nasopharyngeal Nasopharyngeal Swab   Specimen: Nasopharyngeal Swab  Result Value Ref Range    SARS Coronavirus 2 NEGATIVE NEGATIVE   Laboratory interpretation all normal     EKG None  Radiology DG Chest Port 1 View  Result Date: 01/25/2020 CLINICAL DATA:  Cough, shortness of breath, and congestion for 5 days. Positive COVID test. EXAM: PORTABLE CHEST 1 VIEW COMPARISON:  CT 07/09/2018 FINDINGS: The heart size and mediastinal contours are within normal limits. Both lungs are clear. The visualized skeletal structures are unremarkable. IMPRESSION: No active disease. Electronically Signed   By: Burman Nieves M.D.   On: 01/25/2020 23:37    Procedures Procedures (including critical care time)  Medications Ordered in ED Medications - No data to display  ED Course  I have reviewed the triage vital signs and the nursing notes.  Pertinent labs & imaging results that were available during my care of the patient were reviewed by me and considered in my medical decision making (see chart for details).    MDM Rules/Calculators/A&P                         Patient presents with symptoms consistent with Covid illness and a home positive test however her test was negative in the ED.  I elected to go ahead and treat her as if she were positive.  She already has a pulse oximeter and she was advised if her oxygen drops into the 80s to return to the ED for possible admission.  She was advised on also other outpatient medications to manage her symptoms.   Sonya Gibson was evaluated in Emergency Department on 01/26/2020 for the symptoms described in the history of present illness. She was evaluated in the context of the global COVID-19 pandemic, which necessitated consideration that the patient might be at risk for infection with the SARS-CoV-2 virus that causes COVID-19. Institutional protocols and algorithms that pertain to the evaluation of patients at risk for COVID-19 are in a state of rapid change based on information released by regulatory bodies including the CDC and federal and state  organizations. These policies and algorithms were followed during the patient's care in the ED. she was also advised to monitor blood sugars closely while on the prednisone.   Final Clinical Impression(s) / ED Diagnoses Final diagnoses:  Clinical diagnosis of COVID-19    Rx / DC Orders ED Discharge Orders         Ordered    azithromycin (ZITHROMAX Z-PAK) 250 MG tablet     Discontinue  Reprint     01/26/20 0151    predniSONE (DELTASONE) 20 MG tablet     Discontinue  Reprint     01/26/20 0151        OTC zinc, vitamin D, vitamin C, aspirin 81  mg, Mucinex DM  Plan discharge  Devoria Albe, MD, Concha Pyo, MD 01/26/20 [phone]

## 2020-01-26 NOTE — Discharge Instructions (Addendum)
Drink plenty of fluids.  Get the prescriptions filled and start taking them.  Please monitor your blood sugar closely because the steroids can make your blood sugar get elevated.  Watch and limit your carbohydrate intake.  Take ibuprofen 600 mg plus acetaminophen 1000 mg every 6 hours as needed for fever and body aches.  Continue your Phenergan and Zofran for nausea.  Start taking zinc 50 mg once a day, vitamin D and vitamin C daily, and 1 aspirin 81 mg a day.  You can take Mucinex DM over-the-counter for cough.  Monitor your oxygen and if it drops down into the 80s you should return to the hospital and be prepared to be admitted.

## 2020-02-21 ENCOUNTER — Emergency Department (HOSPITAL_COMMUNITY): Payer: BC Managed Care – PPO

## 2020-02-21 ENCOUNTER — Other Ambulatory Visit: Payer: Self-pay

## 2020-02-21 ENCOUNTER — Encounter (HOSPITAL_COMMUNITY): Payer: Self-pay | Admitting: Emergency Medicine

## 2020-02-21 ENCOUNTER — Emergency Department (HOSPITAL_COMMUNITY)
Admission: EM | Admit: 2020-02-21 | Discharge: 2020-02-21 | Disposition: A | Discharge status: Home / Self Care | Payer: BC Managed Care – PPO | Attending: Emergency Medicine | Admitting: Emergency Medicine

## 2020-02-21 DIAGNOSIS — R1084 Generalized abdominal pain: Secondary | ICD-10-CM | POA: Diagnosis not present

## 2020-02-21 DIAGNOSIS — Z79899 Other long term (current) drug therapy: Secondary | ICD-10-CM | POA: Diagnosis not present

## 2020-02-21 DIAGNOSIS — R1010 Upper abdominal pain, unspecified: Secondary | ICD-10-CM | POA: Diagnosis not present

## 2020-02-21 DIAGNOSIS — R109 Unspecified abdominal pain: Secondary | ICD-10-CM | POA: Diagnosis present

## 2020-02-21 DIAGNOSIS — Z87891 Personal history of nicotine dependence: Secondary | ICD-10-CM | POA: Diagnosis not present

## 2020-02-21 DIAGNOSIS — I10 Essential (primary) hypertension: Secondary | ICD-10-CM | POA: Insufficient documentation

## 2020-02-21 DIAGNOSIS — E119 Type 2 diabetes mellitus without complications: Secondary | ICD-10-CM | POA: Insufficient documentation

## 2020-02-21 DIAGNOSIS — E039 Hypothyroidism, unspecified: Secondary | ICD-10-CM | POA: Insufficient documentation

## 2020-02-21 LAB — URINALYSIS, ROUTINE W REFLEX MICROSCOPIC
Bilirubin Urine: NEGATIVE
Glucose, UA: NEGATIVE mg/dL
Hgb urine dipstick: NEGATIVE
Ketones, ur: NEGATIVE mg/dL
Nitrite: NEGATIVE
Protein, ur: NEGATIVE mg/dL
Specific Gravity, Urine: 1.03 (ref 1.005–1.030)
pH: 5 (ref 5.0–8.0)

## 2020-02-21 LAB — LIPASE, BLOOD: Lipase: 22 U/L (ref 11–51)

## 2020-02-21 LAB — COMPREHENSIVE METABOLIC PANEL
ALT: 23 U/L (ref 0–44)
AST: 17 U/L (ref 15–41)
Albumin: 3.6 g/dL (ref 3.5–5.0)
Alkaline Phosphatase: 76 U/L (ref 38–126)
Anion gap: 10 (ref 5–15)
BUN: 13 mg/dL (ref 6–20)
CO2: 26 mmol/L (ref 22–32)
Calcium: 8.8 mg/dL — ABNORMAL LOW (ref 8.9–10.3)
Chloride: 101 mmol/L (ref 98–111)
Creatinine, Ser: 0.79 mg/dL (ref 0.44–1.00)
GFR calc Af Amer: >60 mL/min (ref >60)
GFR calc non Af Amer: >60 mL/min (ref >60)
Glucose, Bld: 116 mg/dL — ABNORMAL HIGH (ref 70–99)
Potassium: 3.8 mmol/L (ref 3.5–5.1)
Sodium: 137 mmol/L (ref 135–145)
Total Bilirubin: 0.6 mg/dL (ref 0.3–1.2)
Total Protein: 6.8 g/dL (ref 6.5–8.1)

## 2020-02-21 LAB — CBC WITH DIFFERENTIAL/PLATELET
Abs Immature Granulocytes: 0.02 10*3/uL (ref 0.00–0.07)
Basophils Absolute: 0.1 10*3/uL (ref 0.0–0.1)
Basophils Relative: 1 %
Eosinophils Absolute: 0.2 10*3/uL (ref 0.0–0.5)
Eosinophils Relative: 2 %
HCT: 38.6 % (ref 36.0–46.0)
Hemoglobin: 12 g/dL (ref 12.0–15.0)
Immature Granulocytes: 0 %
Lymphocytes Relative: 40 %
Lymphs Abs: 4 10*3/uL (ref 0.7–4.0)
MCH: 27.9 pg (ref 26.0–34.0)
MCHC: 31.1 g/dL (ref 30.0–36.0)
MCV: 89.8 fL (ref 80.0–100.0)
Monocytes Absolute: 0.4 10*3/uL (ref 0.1–1.0)
Monocytes Relative: 4 %
Neutro Abs: 5.2 10*3/uL (ref 1.7–7.7)
Neutrophils Relative %: 53 %
Platelets: 342 10*3/uL (ref 150–400)
RBC: 4.3 MIL/uL (ref 3.87–5.11)
RDW: 14.8 % (ref 11.5–15.5)
WBC: 9.9 10*3/uL (ref 4.0–10.5)
nRBC: 0 % (ref 0.0–0.2)

## 2020-02-21 LAB — PREGNANCY, URINE: Preg Test, Ur: NEGATIVE

## 2020-02-21 MED ORDER — SODIUM CHLORIDE 0.9 % IV BOLUS
1000.0000 mL | Freq: Once | INTRAVENOUS | Status: AC
  Administered 2020-02-21: 1000 mL via INTRAVENOUS

## 2020-02-21 MED ORDER — HYDROMORPHONE HCL 1 MG/ML IJ SOLN
1.0000 mg | Freq: Once | INTRAMUSCULAR | Status: AC
  Administered 2020-02-21: 1 mg via INTRAVENOUS
  Filled 2020-02-21: qty 1

## 2020-02-21 MED ORDER — DICYCLOMINE HCL 20 MG PO TABS: ORAL_TABLET | ORAL | 0 refills | Status: DC

## 2020-02-21 MED ORDER — IOHEXOL 300 MG/ML  SOLN
100.0000 mL | Freq: Once | INTRAMUSCULAR | Status: AC | PRN
  Administered 2020-02-21: 100 mL via INTRAVENOUS

## 2020-02-21 MED ORDER — ONDANSETRON HCL 4 MG/2ML IJ SOLN
4.0000 mg | Freq: Once | INTRAMUSCULAR | Status: AC
  Administered 2020-02-21: 4 mg via INTRAVENOUS
  Filled 2020-02-21: qty 2

## 2020-02-21 MED ORDER — PANTOPRAZOLE SODIUM 20 MG PO TBEC: 20.0000 mg | DELAYED_RELEASE_TABLET | Freq: Every day | ORAL | 0 refills | Status: DC

## 2020-02-21 NOTE — ED Provider Notes (Signed)
[facility] EMERGENCY DEPARTMENT Provider Note   CSN: [id number] Arrival date & time: 02/21/20  0554     History Chief Complaint  Patient presents with  . Abdominal Pain    Sonya Gibson is a 34 y.o. female.  Pt complains of abd pain.  Mild nausea no vomiting  The history is provided by the patient and medical records. No language interpreter was used.  Abdominal Pain Pain location:  Generalized Pain quality: aching   Pain radiates to:  Does not radiate Pain severity:  Mild Onset quality:  Gradual Timing:  Intermittent Progression:  Waxing and waning Chronicity:  New Context: not alcohol use   Relieved by:  Nothing Worsened by:  Nothing Associated symptoms: no chest pain, no cough, no diarrhea, no fatigue and no hematuria        Past Medical History:  Diagnosis Date  . Anxiety   . Chronic back pain   . Depression   . Diabetes (HCC)   . Drug-seeking behavior   . Essential hypertension   . GERD (gastroesophageal reflux disease)   . Hypothyroidism   . Lumbar radiculopathy, right   . Migraine headache   . PTSD (post-traumatic stress disorder)     Patient Active Problem List   Diagnosis Date Noted  . Morbid obesity with BMI of 60.0-69.9, adult (HCC) 07/10/2018  . Headache due to low cerebrospinal fluid pressure 07/09/2018  . Postoperative CSF leak 07/09/2018  . 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 DM (diabetes mellitus) (HCC) 07/08/2018  . Headache 07/02/2018  . Vaginal bleeding 07/02/2018  . CSF leak 12/20/2017  . Pressure injury of skin 12/20/2017  . Lumbar radiculopathy   . S/P lumbar laminectomy 12/05/2017  . Perineal numbness 11/30/2017  . Weakness of right lower extremity 11/17/2017  . Lower back pain 11/15/2017  . Hypothyroidism   . Anxiety   . Essential hypertension   . PTSD (post-traumatic stress disorder)   . Decreased sensation of leg   . GESTATIONAL DIABETES 04/26/2010    Past Surgical History:  Procedure Laterality Date  .  ANTERIOR CRUCIATE LIGAMENT REPAIR Left   . BACK SURGERY  12/05/2017  . CESAREAN SECTION    . IR EPIDUROGRAPHY  11/17/2017  . KNEE SURGERY    . LUMBAR LAMINECTOMY/DECOMPRESSION MICRODISCECTOMY Right 12/05/2017   Procedure: Right L4-5 Microdiskectomy;  Surgeon: Tia Alert, MD;  Location: [facility] OR;  Service: Neurosurgery;  Laterality: Right;  . LUMBAR LAMINECTOMY/DECOMPRESSION MICRODISCECTOMY N/A 12/20/2017   Procedure: Repair of CSF Leak;  Surgeon: Tia Alert, MD;  Location: [facility] OR;  Service: Neurosurgery;  Laterality: N/A;  . LUMBAR WOUND DEBRIDEMENT N/A 12/27/2017   Procedure: Re-exploration for CSF leak repair and placement of lumbar drain;  Surgeon: Tia Alert, MD;  Location: [facility] OR;  Service: Neurosurgery;  Laterality: N/A;  . middle finger reatachment    . PLACEMENT OF LUMBAR DRAIN N/A 12/27/2017   Procedure: PLACEMENT OF LUMBAR DRAIN;  Surgeon: Tia Alert, MD;  Location: [facility] OR;  Service: Neurosurgery;  Laterality: N/A;  . TONSILLECTOMY AND ADENOIDECTOMY  1996  . VENTRICULOPERITONEAL SHUNT N/A 01/08/2018   Procedure: Re-exploration and repair of previous Lumbar cerebro-spinal fluid leak and placement of Lumbar drain;  Surgeon: Tia Alert, MD;  Location: [facility] OR;  Service: Neurosurgery;  Laterality: N/A;     OB History    Gravida  1   Para      Term      Preterm      AB  Living        SAB      TAB      Ectopic      Multiple      Live Births              Family History  Problem Relation Age of Onset  . Diabetes Mellitus II Mother   . Hypertension Mother   . Seizures Mother   . Other Mother        post stroke headaches  . Stroke Mother   . Hypertension Father   . Diabetes Mellitus II Father   . Hyperlipidemia Father   . Bipolar disorder Sister   . Asthma Son   . Autism Son   . Tourette syndrome Son   . Autism Daughter        level 3  . Other Daughter        tibia torsion & femoral aniversion    Social History   Tobacco Use  . Smoking  status: Former Smoker    Packs/day: 2.00    Years: 3.00    Pack years: 6.00    Types: Cigarettes    Quit date: 2006    Years since quitting: 15.6  . Smokeless tobacco: Never Used  Vaping Use  . Vaping Use: Never used  Substance Use Topics  . Alcohol use: Yes    Comment: socially  . Drug use: No    Home Medications Prior to Admission medications   Medication Sig Start Date End Date Taking? Authorizing Provider  acetaminophen (TYLENOL) 325 MG tablet Take 650 mg by mouth every 6 (six) hours as needed.    [provider]  albuterol (VENTOLIN HFA) 108 (90 Base) MCG/ACT inhaler Inhale 2 puffs into the lungs every 6 (six) hours as needed for wheezing or shortness of breath.    [provider]  azithromycin (ZITHROMAX Z-PAK) 250 MG tablet Take 2 po the first day then once a day for the next 4 days. 01/26/20   Devoria Albe, MD  Butalbital-APAP-Caffeine 50-300-40 MG CAPS Take 1 capsule by mouth every 4 (four) hours as needed (headache).  12/19/17   [provider]  calcium carbonate (TUMS EX) 750 MG chewable tablet Chew 2 tablets by mouth as needed for heartburn.    [provider]  cephALEXin (KEFLEX) 250 MG capsule Take 1 capsule (250 mg total) by mouth 4 (four) times daily. 10/07/19   Avegno, Zachery Dakins, FNP  cyanocobalamin (,VITAMIN B-12,) 1000 MCG/ML injection Inject 1,000 mcg into the muscle See admin instructions. Every two weeks 02/16/18   [provider]  dicyclomine (BENTYL) 20 MG tablet Take one po q8h prn abd cramps 02/21/20   Bethann Berkshire, MD  DULoxetine (CYMBALTA) 60 MG capsule Take 120 mg by mouth daily.    [provider]  fluconazole (DIFLUCAN) 150 MG tablet Take 1 tablet (150 mg total) by mouth daily. Take second dose 72 hours after the first if symptom does not resolve 10/07/19   Avegno, Zachery Dakins, FNP  gabapentin (NEURONTIN) 600 MG tablet Take 600 mg by mouth 3 (three) times daily.    [provider]  ibuprofen  (ADVIL,MOTRIN) 200 MG tablet Take 400 mg by mouth every 6 (six) hours as needed.    [provider]  Melatonin 10 MG TABS Take 10 mg by mouth daily as needed (sleep).    [provider]  metFORMIN (GLUCOPHAGE) 500 MG tablet Take 1,000 mg by mouth 2 (two) times daily with a meal.  09/25/17 09/20/18  [provider]  pantoprazole (PROTONIX) 20 MG tablet Take 1 tablet (20 mg total) by mouth daily. 02/21/20   Bethann Berkshire, MD  phenazopyridine (PYRIDIUM) 100 MG tablet Take 1 tablet (100 mg total) by mouth 3 (three) times daily as needed for pain. 10/31/19   Avegno, Zachery Dakins, FNP  predniSONE (DELTASONE) 20 MG tablet Take 3 po QD x 3d , then 2 po QD x 3d then 1 po QD x 3d 01/26/20   Devoria Albe, MD  Prenatal Vit-Fe Fumarate-FA (PRENATAL MULTIVITAMIN) TABS tablet Take 2 tablets by mouth Nightly.    [provider]  promethazine (PHENERGAN) 25 MG tablet Take 1 tablet (25 mg total) by mouth every 6 (six) hours as needed for nausea or vomiting. 07/06/18   Anson Fret, MD  traZODone (DESYREL) 150 MG tablet Take 150 mg by mouth at bedtime as needed for sleep.    [provider]  Vitamin D, Ergocalciferol, (DRISDOL) 1.25 MG (50000 UT) CAPS capsule Take 50,000 Units by mouth every 7 (seven) days.    [provider]  levETIRAcetam (KEPPRA) 500 MG tablet Take 1 tablet (500 mg total) by mouth 2 (two) times daily. 07/10/18 07/12/19  Catarina Hartshorn, MD  metoprolol tartrate (LOPRESSOR) 25 MG tablet Take 0.5 tablets (12.5 mg total) by mouth 2 (two) times daily. 07/10/18 06/02/19  Catarina Hartshorn, MD  venlafaxine XR (EFFEXOR-XR) 75 MG 24 hr capsule Take 75 mg by mouth at bedtime.  12/12/17 06/02/19  [provider]    Allergies    Cinnamon, Coconut oil, Lovenox [enoxaparin], Nitrofurantoin, and Other  Review of Systems   Review of Systems  Constitutional: Negative for appetite change and fatigue.  HENT: Negative for congestion, ear discharge and sinus pressure.   Eyes:  Negative for discharge.  Respiratory: Negative for cough.   Cardiovascular: Negative for chest pain.  Gastrointestinal: Positive for abdominal pain. Negative for diarrhea.  Genitourinary: Negative for frequency and hematuria.  Musculoskeletal: Negative for back pain.  Skin: Negative for rash.  Neurological: Negative for seizures and headaches.  Psychiatric/Behavioral: Negative for hallucinations.    Physical Exam Updated Vital Signs BP 121/87 (BP Location: Right Arm)   Pulse (!) 120   Temp 98.4 F (36.9 C) (Oral)   Resp 18   SpO2 97%   Physical Exam Vitals and nursing note reviewed.  Constitutional:      Appearance: She is well-developed.  HENT:     Head: Normocephalic.     Nose: Nose normal.  Eyes:     General: No scleral icterus.    Conjunctiva/sclera: Conjunctivae normal.  Neck:     Thyroid: No thyromegaly.  Cardiovascular:     Rate and Rhythm: Normal rate and regular rhythm.     Heart sounds: No murmur heard.  No friction rub. No gallop.   Pulmonary:     Breath sounds: No stridor. No wheezing or rales.  Chest:     Chest wall: No tenderness.  Abdominal:     General: There is no distension.     Tenderness: There is abdominal tenderness. There is no rebound.  Musculoskeletal:        General: Normal range of motion.     Cervical back: Neck supple.  Lymphadenopathy:     Cervical: No cervical adenopathy.  Skin:    Findings: No erythema or rash.  Neurological:     Mental Status: She is alert and oriented to person, place, and time.     Motor: No abnormal muscle  tone.     Coordination: Coordination normal.  Psychiatric:        Behavior: Behavior normal.     ED Results / Procedures / Treatments   Labs (all labs ordered are listed, but only abnormal results are displayed) Labs Reviewed  URINALYSIS, ROUTINE W REFLEX MICROSCOPIC - Abnormal; Notable for the following components:      Result Value   APPearance HAZY (*)    Leukocytes,Ua TRACE (*)    Bacteria,  UA RARE (*)    All other components within normal limits  COMPREHENSIVE METABOLIC PANEL - Abnormal; Notable for the following components:   Glucose, Bld 116 (*)    Calcium 8.8 (*)    All other components within normal limits  URINE CULTURE  PREGNANCY, URINE  CBC WITH DIFFERENTIAL/PLATELET  LIPASE, BLOOD    EKG None  Radiology US Abdomen Complete  Result Date: 02/21/2020 CLINICAL DATA:  Right-sided abdominal pain for 1 week EXAM: ABDOMEN ULTRASOUND COMPLETE COMPARISON:  Ultrasound 12/29/2014, CT 12/09/2017 FINDINGS: Gallbladder: No gallstones or wall thickening visualized. No sonographic Murphy sign noted by sonographer. Common bile duct: Diameter: 5 mm. Liver: No focal lesion identified. Diffusely increased hepatic parenchymal echogenicity. Portal vein is patent on color Doppler imaging with normal direction of blood flow towards the liver. IVC: No abnormality visualized. Pancreas: Poorly visualized. Spleen: Size and appearance within normal limits. Right Kidney: Length: 13.4 cm. Echogenicity within normal limits. No mass, shadowing stone, or hydronephrosis visualized. Left Kidney: Length: 14.4 cm. Echogenicity within normal limits. 6 mm echogenic, shadowing calcification within the midpole of the left kidney. No mass or hydronephrosis visualized. Abdominal aorta: Largely obscured by overlying bowel gas. Mid abdominal aorta is measured at 3.1 cm in transverse diameter. The proximal and distal portions were not well visualized. Other findings: None. IMPRESSION: 1. The echogenicity of the liver is increased. This is a nonspecific finding but is most commonly seen with fatty infiltration of the liver. There are no obvious focal liver lesions. 2. 6 mm nonobstructing left renal stone. 3. Although poorly evaluated, the mid abdominal aorta was measured at 3.1 cm in transverse dimension. Recommend follow-up every 3 years. This recommendation follows ACR consensus guidelines: White Paper of the ACR  Incidental Findings Committee II on Vascular Findings. J Am Coll Radiol 2013; 10:789-794. Electronically Signed   By: Duanne GuessNicholas  Plundo D.O.   On: 02/21/2020 08:39   CT ABDOMEN PELVIS W CONTRAST  Result Date: 02/21/2020 CLINICAL DATA:  Abdominal distension, right abdominal pain X 2 days and nausea. History of PCOS, C-section, spinal surgery, diabetes EXAM: CT ABDOMEN AND PELVIS WITH CONTRAST TECHNIQUE: Multidetector CT imaging of the abdomen and pelvis was performed using the standard protocol following bolus administration of intravenous contrast. CONTRAST:  100mL OMNIPAQUE IOHEXOL 300 MG/ML  SOLN COMPARISON:  CT abdomen pelvis 12/08/2017, CT abdomen pelvis 05/01/2016 FINDINGS: Lower chest: No acute abnormality. Hepatobiliary: The liver is enlarged measuring up to 23.3 cm. The hepatic parenchyma is diffusely hypodense compared to the spleen consistent with hepatic steatosis with pericholecystic fatty sparing. No focal liver abnormality is seen. No gallstones, gallbladder wall thickening, or biliary dilatation. Pancreas: Unremarkable. No pancreatic ductal dilatation or surrounding inflammatory changes. Spleen: Normal in size without focal abnormality. Splenule identified. Adrenals/Urinary Tract: Adrenal glands are unremarkable. Bilateral kidneys enhance symmetrically. No renal calculi, focal lesion, or hydronephrosis. Urinary bladder is decompressed and unremarkable. Stomach/Bowel: Stomach is within normal limits. Appendix appears normal. No evidence of bowel wall thickening, distention, or inflammatory changes. Vascular/Lymphatic: No significant vascular findings are present.  No enlarged abdominal or pelvic lymph nodes. Reproductive: Uterus and bilateral adnexa are unremarkable. Other: No abdominal wall hernia or abnormality. No abdominopelvic ascites. Musculoskeletal: No acute or suspicious lytic or blastic osseous lesions. Degenerative changes of the spine at the L4-L5 and L5-S1 levels with intervertebral disc  space vacuum phenomenon. Mild diffuse subcutaneous soft tissue edema. IMPRESSION: No acute intra-abdominal intrapelvic abnormality. Hepatomegaly and hepatic steatosis. Electronically Signed   By: Tish Frederickson M.D.   On: 02/21/2020 12:48    Procedures Procedures (including critical care time)  Medications Ordered in ED Medications  ondansetron (ZOFRAN) injection 4 mg (4 mg Intravenous Given 02/21/20 0844)  HYDROmorphone (DILAUDID) injection 1 mg (1 mg Intravenous Given 02/21/20 0845)  sodium chloride 0.9 % bolus 1,000 mL (1,000 mLs Intravenous New Bag/Given 02/21/20 0844)  HYDROmorphone (DILAUDID) injection 1 mg (1 mg Intravenous Given 02/21/20 1201)  ondansetron (ZOFRAN) injection 4 mg (4 mg Intravenous Given 02/21/20 1201)  iohexol (OMNIPAQUE) 300 MG/ML solution 100 mL (100 mLs Intravenous Contrast Given 02/21/20 1223)    ED Course  I have reviewed the triage vital signs and the nursing notes.  Pertinent labs & imaging results that were available during my care of the patient were reviewed by me and considered in my medical decision making (see chart for details).    MDM Rules/Calculators/A&P                          Pt with abd pain. Ultrasound and ct abd neg.  Pt will be put on protonix and bentyl with follow up with her md      This patient presents to the ED for concern of abdominal pain, this involves an extensive number of treatment options, and is a complaint that carries with it a high risk of complications and morbidity.  The differential diagnosis includes gastritis bowel obstruction   Lab Tests:   I Ordered, reviewed, and interpreted labs, which included CBC chemistries Which are unremarkable Medicines ordered:   I ordered medication Bentyl and Protonix for abdominal pain  Imaging Studies ordered:   I ordered imaging studies which included CT of the abdomen along with ultrasound abdomen  I independently visualized and interpreted imaging which showed no acute  disease  Additional history obtained:   Additional history obtained from records  Previous records obtained and reviewed.  Consultations Obtained:     Reevaluation:  After the interventions stated above, I reevaluated the patient and found mild improvement  Critical Interventions:  .   Final Clinical Impression(s) / ED Diagnoses Final diagnoses:  Pain of upper abdomen    Rx / DC Orders ED Discharge Orders         Ordered    pantoprazole (PROTONIX) 20 MG tablet  Daily        02/21/20 1317    dicyclomine (BENTYL) 20 MG tablet        02/21/20 1317           Bethann Berkshire, MD 02/25/20 [phone]

## 2020-02-21 NOTE — Discharge Instructions (Addendum)
Follow-up with your family doctor next week for recheck. 

## 2020-02-21 NOTE — ED Triage Notes (Signed)
Pt reports right sided abdominal pain that began last week. Pt reports taking mag citrate but did not get relief. Pt reports nausea no vomiting. Also reports feeling bloated.

## 2020-02-23 LAB — URINE CULTURE: Culture: >=100000 — AB

## 2020-02-26 ENCOUNTER — Encounter (HOSPITAL_COMMUNITY): Payer: Self-pay | Admitting: *Deleted

## 2020-02-26 ENCOUNTER — Emergency Department (HOSPITAL_COMMUNITY)
Admission: EM | Admit: 2020-02-26 | Discharge: 2020-02-27 | Disposition: A | Discharge status: Home / Self Care | Payer: BC Managed Care – PPO | Attending: Emergency Medicine | Admitting: Emergency Medicine

## 2020-02-26 ENCOUNTER — Other Ambulatory Visit: Payer: Self-pay

## 2020-02-26 ENCOUNTER — Emergency Department (HOSPITAL_COMMUNITY): Payer: BC Managed Care – PPO

## 2020-02-26 DIAGNOSIS — R109 Unspecified abdominal pain: Secondary | ICD-10-CM | POA: Diagnosis present

## 2020-02-26 DIAGNOSIS — I10 Essential (primary) hypertension: Secondary | ICD-10-CM | POA: Insufficient documentation

## 2020-02-26 DIAGNOSIS — M549 Dorsalgia, unspecified: Secondary | ICD-10-CM

## 2020-02-26 DIAGNOSIS — K219 Gastro-esophageal reflux disease without esophagitis: Secondary | ICD-10-CM | POA: Diagnosis not present

## 2020-02-26 DIAGNOSIS — E039 Hypothyroidism, unspecified: Secondary | ICD-10-CM | POA: Insufficient documentation

## 2020-02-26 DIAGNOSIS — M545 Low back pain: Secondary | ICD-10-CM | POA: Diagnosis not present

## 2020-02-26 DIAGNOSIS — Z79899 Other long term (current) drug therapy: Secondary | ICD-10-CM | POA: Insufficient documentation

## 2020-02-26 DIAGNOSIS — R3912 Poor urinary stream: Secondary | ICD-10-CM | POA: Insufficient documentation

## 2020-02-26 DIAGNOSIS — E119 Type 2 diabetes mellitus without complications: Secondary | ICD-10-CM | POA: Insufficient documentation

## 2020-02-26 DIAGNOSIS — Z87891 Personal history of nicotine dependence: Secondary | ICD-10-CM | POA: Diagnosis not present

## 2020-02-26 HISTORY — DX: Calculus of kidney: N20.0

## 2020-02-26 LAB — URINALYSIS, ROUTINE W REFLEX MICROSCOPIC
Bilirubin Urine: NEGATIVE
Glucose, UA: NEGATIVE mg/dL
Hgb urine dipstick: NEGATIVE
Ketones, ur: NEGATIVE mg/dL
Leukocytes,Ua: NEGATIVE
Nitrite: NEGATIVE
Protein, ur: NEGATIVE mg/dL
Specific Gravity, Urine: 1.017 (ref 1.005–1.030)
pH: 6 (ref 5.0–8.0)

## 2020-02-26 LAB — PREGNANCY, URINE: Preg Test, Ur: NEGATIVE

## 2020-02-26 NOTE — ED Triage Notes (Signed)
Pt with left flank pain ongoing for 2 days, last voided 3 hours ago.  Pt with hx of kidney stones. No blood noted in urine.

## 2020-02-27 MED ORDER — HYDROCODONE-ACETAMINOPHEN 5-325 MG PO TABS: 1.0000 | ORAL_TABLET | ORAL | 0 refills | Status: DC | PRN

## 2020-02-27 NOTE — ED Provider Notes (Signed)
[facility] EMERGENCY DEPARTMENT Provider Note   CSN: [id number] Arrival date & time: 02/26/20  1712     History Chief Complaint  Patient presents with  . Flank Pain    Sonya Gibson is a 34 y.o. female.  Patient presents with complaints of left flank pain.  Symptoms began 2 days ago.  She has noticed some pressure in the area when she urinates and has had some difficulty urinating.  She denies any injury.  She does have a history of urinary tract infections, kidney stones as well as chronic back problems.  She is not sure what this feels like.        Past Medical History:  Diagnosis Date  . Anxiety   . Chronic back pain   . Depression   . Diabetes (HCC)   . Drug-seeking behavior   . Essential hypertension   . GERD (gastroesophageal reflux disease)   . Hypothyroidism   . Kidney stone   . Lumbar radiculopathy, right   . Migraine headache   . PTSD (post-traumatic stress disorder)     Patient Active Problem List   Diagnosis Date Noted  . Morbid obesity with BMI of 60.0-69.9, adult (HCC) 07/10/2018  . Headache due to low cerebrospinal fluid pressure 07/09/2018  . Postoperative CSF leak 07/09/2018  . 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 DM (diabetes mellitus) (HCC) 07/08/2018  . Headache 07/02/2018  . Vaginal bleeding 07/02/2018  . CSF leak 12/20/2017  . Pressure injury of skin 12/20/2017  . Lumbar radiculopathy   . S/P lumbar laminectomy 12/05/2017  . Perineal numbness 11/30/2017  . Weakness of right lower extremity 11/17/2017  . Lower back pain 11/15/2017  . Hypothyroidism   . Anxiety   . Essential hypertension   . PTSD (post-traumatic stress disorder)   . Decreased sensation of leg   . GESTATIONAL DIABETES 04/26/2010    Past Surgical History:  Procedure Laterality Date  . ANTERIOR CRUCIATE LIGAMENT REPAIR Left   . BACK SURGERY  12/05/2017  . CESAREAN SECTION    . IR EPIDUROGRAPHY  11/17/2017  . KNEE SURGERY    . LUMBAR  LAMINECTOMY/DECOMPRESSION MICRODISCECTOMY Right 12/05/2017   Procedure: Right L4-5 Microdiskectomy;  Surgeon: Tia Alert, MD;  [facility];  Service: Neurosurgery;  Laterality: Right;  . LUMBAR LAMINECTOMY/DECOMPRESSION MICRODISCECTOMY N/A 12/20/2017   Procedure: Repair of CSF Leak;  Surgeon: Tia Alert, MD;  Location: [facility] OR;  Service: Neurosurgery;  Laterality: N/A;  . LUMBAR WOUND DEBRIDEMENT N/A 12/27/2017   Procedure: Re-exploration for CSF leak repair and placement of lumbar drain;  Surgeon: Tia Alert, MD;  Location: [facility] OR;  Service: Neurosurgery;  Laterality: N/A;  . middle finger reatachment    . PLACEMENT OF LUMBAR DRAIN N/A 12/27/2017   Procedure: PLACEMENT OF LUMBAR DRAIN;  Surgeon: Tia Alert, MD;  Location: [facility] OR;  Service: Neurosurgery;  Laterality: N/A;  . TONSILLECTOMY AND ADENOIDECTOMY  1996  . VENTRICULOPERITONEAL SHUNT N/A 01/08/2018   Procedure: Re-exploration and repair of previous Lumbar cerebro-spinal fluid leak and placement of Lumbar drain;  Surgeon: Tia Alert, MD;  Location: [facility] OR;  Service: Neurosurgery;  Laterality: N/A;     OB History    Gravida  1   Para      Term      Preterm      AB      Living        SAB      TAB      Ectopic  Multiple      Live Births              Family History  Problem Relation Age of Onset  . Diabetes Mellitus II Mother   . Hypertension Mother   . Seizures Mother   . Other Mother        post stroke headaches  . Stroke Mother   . Hypertension Father   . Diabetes Mellitus II Father   . Hyperlipidemia Father   . Bipolar disorder Sister   . Asthma Son   . Autism Son   . Tourette syndrome Son   . Autism Daughter        level 3  . Other Daughter        tibia torsion & femoral aniversion    Social History   Tobacco Use  . Smoking status: Former Smoker    Packs/day: 2.00    Years: 3.00    Pack years: 6.00    Types: Cigarettes    Quit date: 2006    Years since quitting: 15.6    . Smokeless tobacco: Never Used  Vaping Use  . Vaping Use: Never used  Substance Use Topics  . Alcohol use: Yes    Comment: socially  . Drug use: No    Home Medications Prior to Admission medications   Medication Sig Start Date End Date Taking? Authorizing Provider  acetaminophen (TYLENOL) 325 MG tablet Take 650 mg by mouth every 6 (six) hours as needed.    [provider]  albuterol (VENTOLIN HFA) 108 (90 Base) MCG/ACT inhaler Inhale 2 puffs into the lungs every 6 (six) hours as needed for wheezing or shortness of breath.    [provider]  azithromycin (ZITHROMAX Z-PAK) 250 MG tablet Take 2 po the first day then once a day for the next 4 days. 01/26/20   Devoria Albe, MD  Butalbital-APAP-Caffeine 50-300-40 MG CAPS Take 1 capsule by mouth every 4 (four) hours as needed (headache).  12/19/17   [provider]  calcium carbonate (TUMS EX) 750 MG chewable tablet Chew 2 tablets by mouth as needed for heartburn.    [provider]  cephALEXin (KEFLEX) 250 MG capsule Take 1 capsule (250 mg total) by mouth 4 (four) times daily. 10/07/19   Avegno, Zachery Dakins, FNP  cyanocobalamin (,VITAMIN B-12,) 1000 MCG/ML injection Inject 1,000 mcg into the muscle See admin instructions. Every two weeks 02/16/18   [provider]  dicyclomine (BENTYL) 20 MG tablet Take one po q8h prn abd cramps 02/21/20   Bethann Berkshire, MD  DULoxetine (CYMBALTA) 60 MG capsule Take 120 mg by mouth daily.    [provider]  fluconazole (DIFLUCAN) 150 MG tablet Take 1 tablet (150 mg total) by mouth daily. Take second dose 72 hours after the first if symptom does not resolve 10/07/19   Avegno, Zachery Dakins, FNP  gabapentin (NEURONTIN) 600 MG tablet Take 600 mg by mouth 3 (three) times daily.    [provider]  ibuprofen (ADVIL,MOTRIN) 200 MG tablet Take 400 mg by mouth every 6 (six) hours as needed.    [provider]  Melatonin 10 MG TABS Take 10 mg by mouth daily as  needed (sleep).    [provider]  metFORMIN (GLUCOPHAGE) 500 MG tablet Take 1,000 mg by mouth 2 (two) times daily with a meal.  09/25/17 09/20/18  [provider]  pantoprazole (PROTONIX) 20 MG tablet Take 1 tablet (20 mg total) by mouth daily. 02/21/20  Bethann Berkshire, MD  phenazopyridine (PYRIDIUM) 100 MG tablet Take 1 tablet (100 mg total) by mouth 3 (three) times daily as needed for pain. 10/31/19   Avegno, Zachery Dakins, FNP  predniSONE (DELTASONE) 20 MG tablet Take 3 po QD x 3d , then 2 po QD x 3d then 1 po QD x 3d 01/26/20   Devoria Albe, MD  Prenatal Vit-Fe Fumarate-FA (PRENATAL MULTIVITAMIN) TABS tablet Take 2 tablets by mouth Nightly.    [provider]  promethazine (PHENERGAN) 25 MG tablet Take 1 tablet (25 mg total) by mouth every 6 (six) hours as needed for nausea or vomiting. 07/06/18   Anson Fret, MD  traZODone (DESYREL) 150 MG tablet Take 150 mg by mouth at bedtime as needed for sleep.    [provider]  Vitamin D, Ergocalciferol, (DRISDOL) 1.25 MG (50000 UT) CAPS capsule Take 50,000 Units by mouth every 7 (seven) days.    [provider]  levETIRAcetam (KEPPRA) 500 MG tablet Take 1 tablet (500 mg total) by mouth 2 (two) times daily. 07/10/18 07/12/19  Catarina Hartshorn, MD  metoprolol tartrate (LOPRESSOR) 25 MG tablet Take 0.5 tablets (12.5 mg total) by mouth 2 (two) times daily. 07/10/18 06/02/19  Catarina Hartshorn, MD  venlafaxine XR (EFFEXOR-XR) 75 MG 24 hr capsule Take 75 mg by mouth at bedtime.  12/12/17 06/02/19  [provider]    Allergies    Cinnamon, Coconut oil, Lovenox [enoxaparin], Nitrofurantoin, and Other  Review of Systems   Review of Systems  Genitourinary: Positive for decreased urine volume.  Musculoskeletal: Positive for back pain.  All other systems reviewed and are negative.   Physical Exam Updated Vital Signs BP 120/76 (BP Location: Right Arm)   Pulse (!) 114   Temp 98.6 F (37 C) (Oral)   Ht 5\' 1"  (1.549 m)   Wt  134.7 kg   SpO2 99%   BMI 56.12 kg/m   Physical Exam Vitals and nursing note reviewed.  Constitutional:      General: She is not in acute distress.    Appearance: Normal appearance. She is well-developed.  HENT:     Head: Normocephalic and atraumatic.     Right Ear: Hearing normal.     Left Ear: Hearing normal.     Nose: Nose normal.  Eyes:     Conjunctiva/sclera: Conjunctivae normal.     Pupils: Pupils are equal, round, and reactive to light.  Cardiovascular:     Rate and Rhythm: Regular rhythm.     Heart sounds: S1 normal and S2 normal. No murmur heard.  No friction rub. No gallop.   Pulmonary:     Effort: Pulmonary effort is normal. No respiratory distress.     Breath sounds: Normal breath sounds.  Chest:     Chest wall: No tenderness.  Abdominal:     General: Bowel sounds are normal.     Palpations: Abdomen is soft.     Tenderness: There is no abdominal tenderness. There is no guarding or rebound. Negative signs include Murphy's sign and McBurney's sign.     Hernia: No hernia is present.  Musculoskeletal:        General: Tenderness (Back) present. Normal range of motion.     Cervical back: Normal range of motion and neck supple.  Skin:    General: Skin is warm and dry.     Findings: No rash.  Neurological:     Mental Status: She is alert and oriented to person, place, and time.  GCS: GCS eye subscore is 4. GCS verbal subscore is 5. GCS motor subscore is 6.     Cranial Nerves: No cranial nerve deficit.     Sensory: No sensory deficit.     Coordination: Coordination normal.  Psychiatric:        Speech: Speech normal.        Behavior: Behavior normal.        Thought Content: Thought content normal.     ED Results / Procedures / Treatments   Labs (all labs ordered are listed, but only abnormal results are displayed) Labs Reviewed  URINALYSIS, ROUTINE W REFLEX MICROSCOPIC - Abnormal; Notable for the following components:      Result Value   APPearance CLOUDY  (*)    All other components within normal limits  URINE CULTURE  PREGNANCY, URINE    EKG None  Radiology CT Renal Stone Study  Result Date: 02/26/2020 CLINICAL DATA:  Left flank pain for 2 days, history of urinary tract calculi EXAM: CT ABDOMEN AND PELVIS WITHOUT CONTRAST TECHNIQUE: Multidetector CT imaging of the abdomen and pelvis was performed following the standard protocol without IV contrast. COMPARISON:  02/21/2020 FINDINGS: Lower chest: Larey SeatFell no acute pleural or parenchymal lung disease. Hepatobiliary: Stable hepatomegaly and diffuse hepatic steatosis. Gallbladder is unremarkable. No biliary dilation. Pancreas: Unremarkable. No pancreatic ductal dilatation or surrounding inflammatory changes. Spleen: Normal in size without focal abnormality. Adrenals/Urinary Tract: No urinary tract calculi or obstructive uropathy. Bladder is unremarkable. The adrenals are normal. Stomach/Bowel: No bowel obstruction or ileus. Normal appendix right lower quadrant. No bowel wall thickening or inflammatory change. Vascular/Lymphatic: No significant vascular findings are present. No enlarged abdominal or pelvic lymph nodes. Reproductive: Uterus and bilateral adnexa are unremarkable. Other: No free fluid or free gas.  No abdominal wall hernia. Musculoskeletal: No acute or destructive bony lesions. Reconstructed images demonstrate no additional findings. IMPRESSION: 1. No urinary tract calculi or obstructive uropathy. 2. Stable hepatomegaly and hepatic steatosis. Electronically Signed   By: Sharlet SalinaMichael  Brown M.D.   On: 02/26/2020 22:19    Procedures Procedures (including critical care time)  Medications Ordered in ED Medications - No data to display  ED Course  I have reviewed the triage vital signs and the nursing notes.  Pertinent labs & imaging results that were available during my care of the patient were reviewed by me and considered in my medical decision making (see chart for details).    MDM  Rules/Calculators/A&P                          Patient presents with left flank discomfort.  Patient does have a history of chronic back pain but also has a history of kidney stones and urinary tract infections.  Urinalysis does not suggest infection, will culture.  CT scan does not show ureterolithiasis or hydronephrosis.  Patient reports that she has had similar pain in the past with her back as well.  She has had multiple surgeries.  She does not have any neurologic dysfunction at this time.  She reports that she has noticed some decreased urine output but has been able to pass urine.  She does not have any change in lower extremity strength or sensation, no foot drop.  She will call her neurosurgeon in the morning.  Final Clinical Impression(s) / ED Diagnoses Final diagnoses:  Acute left-sided back pain, unspecified back location    Rx / DC Orders ED Discharge Orders    None  Gilda Crease, MD 02/27/20 [phone]

## 2020-02-28 LAB — URINE CULTURE

## 2020-02-28 MED FILL — Hydrocodone-Acetaminophen Tab 5-325 MG: ORAL | Qty: 6 | Status: AC

## 2020-03-16 DIAGNOSIS — M792 Neuralgia and neuritis, unspecified: Secondary | ICD-10-CM | POA: Insufficient documentation

## 2020-03-26 ENCOUNTER — Other Ambulatory Visit: Payer: BC Managed Care – PPO

## 2020-03-26 DIAGNOSIS — Z20822 Contact with and (suspected) exposure to covid-19: Secondary | ICD-10-CM

## 2020-03-27 LAB — SARS-COV-2, NAA 2 DAY TAT

## 2020-03-27 LAB — NOVEL CORONAVIRUS, NAA: SARS-CoV-2, NAA: DETECTED — AB

## 2020-03-28 ENCOUNTER — Encounter: Payer: Self-pay | Admitting: Nurse Practitioner

## 2020-03-28 ENCOUNTER — Other Ambulatory Visit (HOSPITAL_COMMUNITY): Payer: Self-pay | Admitting: Nurse Practitioner

## 2020-03-28 ENCOUNTER — Ambulatory Visit (HOSPITAL_COMMUNITY)
Admission: RE | Admit: 2020-03-28 | Discharge: 2020-03-28 | Disposition: A | Discharge status: Home / Self Care | Payer: BC Managed Care – PPO | Source: Ambulatory Visit | Attending: Pulmonary Disease | Admitting: Pulmonary Disease

## 2020-03-28 DIAGNOSIS — U071 COVID-19: Secondary | ICD-10-CM | POA: Diagnosis not present

## 2020-03-28 MED ORDER — ONDANSETRON HCL 4 MG/2ML IJ SOLN
INTRAMUSCULAR | Status: AC
  Administered 2020-03-28: 4 mg via INTRAVENOUS
  Filled 2020-03-28: qty 2

## 2020-03-28 MED ORDER — SODIUM CHLORIDE 0.9 % IV SOLN: Freq: Once | INTRAVENOUS | Status: AC

## 2020-03-28 MED ORDER — ONDANSETRON HCL 4 MG/2ML IJ SOLN: 4.0000 mg | Freq: Once | INTRAMUSCULAR | Status: AC

## 2020-03-28 MED ORDER — SODIUM CHLORIDE 0.9 % IV SOLN: INTRAVENOUS | Status: DC | PRN

## 2020-03-28 MED ORDER — EPINEPHRINE 0.3 MG/0.3ML IJ SOAJ: 0.3000 mg | Freq: Once | INTRAMUSCULAR | Status: DC | PRN

## 2020-03-28 MED ORDER — FAMOTIDINE IN NACL 20-0.9 MG/50ML-% IV SOLN: 20.0000 mg | Freq: Once | INTRAVENOUS | Status: DC | PRN

## 2020-03-28 MED ORDER — ALBUTEROL SULFATE HFA 108 (90 BASE) MCG/ACT IN AERS
2.0000 | INHALATION_SPRAY | Freq: Once | RESPIRATORY_TRACT | Status: AC | PRN
  Administered 2020-03-28: 2 via RESPIRATORY_TRACT
  Filled 2020-03-28: qty 6.7

## 2020-03-28 MED ORDER — METHYLPREDNISOLONE SODIUM SUCC 125 MG IJ SOLR: 125.0000 mg | Freq: Once | INTRAMUSCULAR | Status: DC | PRN

## 2020-03-28 MED ORDER — DIPHENHYDRAMINE HCL 50 MG/ML IJ SOLN: 50.0000 mg | Freq: Once | INTRAMUSCULAR | Status: DC | PRN

## 2020-03-28 MED ORDER — SODIUM CHLORIDE 0.9 % IV SOLN: Freq: Once | INTRAVENOUS | Status: DC

## 2020-03-28 NOTE — Progress Notes (Signed)
I connected by phone with Rusty Aus on 03/28/2020 at 9:35 AM to discuss the potential use of an new treatment for mild to moderate COVID-19 viral infection in non-hospitalized patients.  This patient is a 34 y.o. female that meets the FDA criteria for Emergency Use Authorization of casirivimab\imdevimab.  Has a (+) direct SARS-CoV-2 viral test result  Has mild or moderate COVID-19   Is ? 35 years of age and weighs ? 40 kg  Is NOT hospitalized due to COVID-19  Is NOT requiring oxygen therapy or requiring an increase in baseline oxygen flow rate due to COVID-19  Is within 10 days of symptom onset  Has at least one of the high risk factor(s) for progression to severe COVID-19 and/or hospitalization as defined in EUA.  Specific high risk criteria : BMI > 25, Diabetes and Cardiovascular disease or hypertension   Onset 10/6   I have spoken and communicated the following to the patient or parent/caregiver:  1. FDA has authorized the emergency use of casirivimab\imdevimab for the treatment of mild to moderate COVID-19 in adults and pediatric patients with positive results of direct SARS-CoV-2 viral testing who are 68 years of age and older weighing at least 40 kg, and who are at high risk for progressing to severe COVID-19 and/or hospitalization.  2. The significant known and potential risks and benefits of casirivimab\imdevimab, and the extent to which such potential risks and benefits are unknown.  3. Information on available alternative treatments and the risks and benefits of those alternatives, including clinical trials.  4. Patients treated with casirivimab\imdevimab should continue to self-isolate and use infection control measures (e.g., wear mask, isolate, social distance, avoid sharing personal items, clean and disinfect "high touch" surfaces, and frequent handwashing) according to CDC guidelines.   5. The patient or parent/caregiver has the option to accept or refuse  casirivimab\imdevimab .  After reviewing this information with the patient, the patient has agreed to receive one of the available covid 19 monoclonal antibodies and will be provided an appropriate fact sheet prior to infusion.Consuello Masse, DNP, AGNP-C [phone] (Infusion Center Hotline)

## 2020-03-28 NOTE — Progress Notes (Signed)
  Diagnosis: COVID-19  Physician: Dr. Wright   Procedure: [facility] Infusion [facility] Med: casirivimab\imdevimab infusion - Provided patient with casirivimab\imdevimab fact sheet for patients, parents and caregivers prior to infusion.  Complications: No immediate complications noted.  Discharge: Discharged home   Jerimiah Wolman  B Chrisopher Pustejovsky 03/28/2020   

## 2020-03-28 NOTE — Discharge Instructions (Signed)

## 2020-05-24 ENCOUNTER — Ambulatory Visit
Admission: EM | Admit: 2020-05-24 | Discharge: 2020-05-24 | Disposition: A | Discharge status: Home / Self Care | Payer: BLUE CROSS/BLUE SHIELD | Attending: Emergency Medicine | Admitting: Emergency Medicine

## 2020-05-24 DIAGNOSIS — M62838 Other muscle spasm: Secondary | ICD-10-CM | POA: Diagnosis not present

## 2020-05-24 DIAGNOSIS — M25512 Pain in left shoulder: Secondary | ICD-10-CM

## 2020-05-24 DIAGNOSIS — M778 Other enthesopathies, not elsewhere classified: Secondary | ICD-10-CM

## 2020-05-24 MED ORDER — DEXAMETHASONE SODIUM PHOSPHATE 10 MG/ML IJ SOLN
10.0000 mg | Freq: Once | INTRAMUSCULAR | Status: AC
  Administered 2020-05-24: 10 mg via INTRAMUSCULAR

## 2020-05-24 MED ORDER — TIZANIDINE HCL 4 MG PO CAPS: 4.0000 mg | ORAL_CAPSULE | Freq: Three times a day (TID) | ORAL | 0 refills | Status: DC | PRN

## 2020-05-24 MED ORDER — PREDNISONE 10 MG PO TABS: 20.0000 mg | ORAL_TABLET | Freq: Every day | ORAL | 0 refills | Status: DC

## 2020-05-24 NOTE — Discharge Instructions (Addendum)
Decadron IM was given in the office Prednisone was prescribed/take as directed Zanaflex was prescribed for muscle spasm.  This medication can make you sleepy so do not operate any machinery or driving while taking this medication Continue to take OTC Tylenol 500 mg as needed for pain Follow RICE instruction that is attached Follow-up with PCP May use the shoulder sling Return or go to ED if you develop any new or worsening of symptoms

## 2020-05-24 NOTE — ED Triage Notes (Signed)
Pt presents with complaints of left shoulder and arm pain. Reports it feels like a muscle spasm in her shoulder. Denies any injury. Denies relief with rest, ice, and ibuprofen. Pain is worse with movement, better with pressure.

## 2020-05-24 NOTE — ED Provider Notes (Signed)
[facility] CARE CENTER   [id number] 05/24/20 Arrival Time: 1429   Chief Complaint  Patient presents with  . Shoulder Pain     SUBJECTIVE: History from: patient.  Sonya Gibson is a 34 y.o. female who who presented to the urgent care with a complaint of left shoulder  pain for the past few days..  Denies any precipitating event, trauma or injury.  Located pain to the left shoulder .  Described pain as constant and achy and described as a spasm-like.Marland Kitchen  Has tried has tried OTC medication and ice without relief.  Report worsening symptoms with ROM.  Denies similar symptoms in the past.  Denies chills, fever, nausea, vomiting, diarrhea, tingling, numbness.   ROS: As per HPI.  All other pertinent ROS negative.      Past Medical History:  Diagnosis Date  . Anxiety   . Chronic back pain   . Depression   . Diabetes (HCC)   . Drug-seeking behavior   . Essential hypertension   . GERD (gastroesophageal reflux disease)   . Hypothyroidism   . Kidney stone   . Lumbar radiculopathy, right   . Migraine headache   . PTSD (post-traumatic stress disorder)    Past Surgical History:  Procedure Laterality Date  . ANTERIOR CRUCIATE LIGAMENT REPAIR Left   . BACK SURGERY  12/05/2017  . CESAREAN SECTION    . IR EPIDUROGRAPHY  11/17/2017  . KNEE SURGERY    . LUMBAR LAMINECTOMY/DECOMPRESSION MICRODISCECTOMY Right 12/05/2017   Procedure: Right L4-5 Microdiskectomy;  Surgeon: Tia Alert, MD;  Location: [facility] OR;  Service: Neurosurgery;  Laterality: Right;  . LUMBAR LAMINECTOMY/DECOMPRESSION MICRODISCECTOMY N/A 12/20/2017   Procedure: Repair of CSF Leak;  Surgeon: Tia Alert, MD;  Location: [facility] OR;  Service: Neurosurgery;  Laterality: N/A;  . LUMBAR WOUND DEBRIDEMENT N/A 12/27/2017   Procedure: Re-exploration for CSF leak repair and placement of lumbar drain;  Surgeon: Tia Alert, MD;  Location: [facility] OR;  Service: Neurosurgery;  Laterality: N/A;  . middle finger reatachment    . PLACEMENT OF  LUMBAR DRAIN N/A 12/27/2017   Procedure: PLACEMENT OF LUMBAR DRAIN;  Surgeon: Tia Alert, MD;  Location: [facility] OR;  Service: Neurosurgery;  Laterality: N/A;  . TONSILLECTOMY AND ADENOIDECTOMY  1996  . VENTRICULOPERITONEAL SHUNT N/A 01/08/2018   Procedure: Re-exploration and repair of previous Lumbar cerebro-spinal fluid leak and placement of Lumbar drain;  Surgeon: Tia Alert, MD;  Location: [facility] OR;  Service: Neurosurgery;  Laterality: N/A;   Allergies  Allergen Reactions  . Cinnamon Anaphylaxis    (NO "REAL"/TREE BARK CINNAMON)  . Coconut Oil Anaphylaxis    Can eat "fake" coconut  . Lovenox [Enoxaparin] Other (See Comments)    BROKE DOWN THE SKIN AT INJECTION SITE AND CAUSED A WOUND  . Nitrofurantoin Hives  . Other     "Headache cocktail"   Blister to abd   No current facility-administered medications on file prior to encounter.   Current Outpatient Medications on File Prior to Encounter  Medication Sig Dispense Refill  . acetaminophen (TYLENOL) 325 MG tablet Take 650 mg by mouth every 6 (six) hours as needed.    Marland Kitchen albuterol (VENTOLIN HFA) 108 (90 Base) MCG/ACT inhaler Inhale 2 puffs into the lungs every 6 (six) hours as needed for wheezing or shortness of breath.    Marland Kitchen azithromycin (ZITHROMAX Z-PAK) 250 MG tablet Take 2 po the first day then once a day for the next 4 days. 6 tablet 0  . Butalbital-APAP-Caffeine  50-300-40 MG CAPS Take 1 capsule by mouth every 4 (four) hours as needed (headache).   0  . calcium carbonate (TUMS EX) 750 MG chewable tablet Chew 2 tablets by mouth as needed for heartburn.    . cephALEXin (KEFLEX) 250 MG capsule Take 1 capsule (250 mg total) by mouth 4 (four) times daily. 28 capsule 0  . cyanocobalamin (,VITAMIN B-12,) 1000 MCG/ML injection Inject 1,000 mcg into the muscle See admin instructions. Every two weeks  5  . dicyclomine (BENTYL) 20 MG tablet Take one po q8h prn abd cramps 20 tablet 0  . DULoxetine (CYMBALTA) 60 MG capsule Take 120 mg by mouth  daily.    . fluconazole (DIFLUCAN) 150 MG tablet Take 1 tablet (150 mg total) by mouth daily. Take second dose 72 hours after the first if symptom does not resolve 2 tablet 0  . gabapentin (NEURONTIN) 600 MG tablet Take 600 mg by mouth 3 (three) times daily.    Marland Kitchen HYDROcodone-acetaminophen (NORCO/VICODIN) 5-325 MG tablet Take 1-2 tablets by mouth every 4 (four) hours as needed. 6 tablet 0  . ibuprofen (ADVIL,MOTRIN) 200 MG tablet Take 400 mg by mouth every 6 (six) hours as needed.    . Melatonin 10 MG TABS Take 10 mg by mouth daily as needed (sleep).    . metFORMIN (GLUCOPHAGE) 500 MG tablet Take 1,000 mg by mouth 2 (two) times daily with a meal.     . pantoprazole (PROTONIX) 20 MG tablet Take 1 tablet (20 mg total) by mouth daily. 30 tablet 0  . phenazopyridine (PYRIDIUM) 100 MG tablet Take 1 tablet (100 mg total) by mouth 3 (three) times daily as needed for pain. 10 tablet 0  . Prenatal Vit-Fe Fumarate-FA (PRENATAL MULTIVITAMIN) TABS tablet Take 2 tablets by mouth Nightly.    . promethazine (PHENERGAN) 25 MG tablet Take 1 tablet (25 mg total) by mouth every 6 (six) hours as needed for nausea or vomiting. 60 tablet 2  . traZODone (DESYREL) 150 MG tablet Take 150 mg by mouth at bedtime as needed for sleep.    . Vitamin D, Ergocalciferol, (DRISDOL) 1.25 MG (50000 UT) CAPS capsule Take 50,000 Units by mouth every 7 (seven) days.    . [DISCONTINUED] levETIRAcetam (KEPPRA) 500 MG tablet Take 1 tablet (500 mg total) by mouth 2 (two) times daily. 60 tablet 1  . [DISCONTINUED] metoprolol tartrate (LOPRESSOR) 25 MG tablet Take 0.5 tablets (12.5 mg total) by mouth 2 (two) times daily. 60 tablet 1  . [DISCONTINUED] venlafaxine XR (EFFEXOR-XR) 75 MG 24 hr capsule Take 75 mg by mouth at bedtime.   3   Social History   Socioeconomic History  . Marital status: Married    Spouse name: Not on file  . Number of children: 3  . 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 Highest education level: Not on file    Occupational History  . Not on file  Tobacco Use  . Smoking status: Former Smoker    Packs/day: 2.00    Years: 3.00    Pack years: 6.00    Types: Cigarettes    Quit date: 2006    Years since quitting: 15.9  . Smokeless tobacco: Never Used  Vaping Use  . Vaping Use: Never used  Substance and Sexual Activity  . Alcohol use: Yes    Comment: socially  . Drug use: No  . Sexual activity: Yes    Birth control/protection: None  Other Topics Concern  . Not on file  Social History Narrative   Lives at home with husband and kids   Right handed   Caffeine: 6 cups daily   Social Determinants of Health   Financial Resource Strain:   . Difficulty of Paying Living Expenses: Not on file  Food Insecurity:   . Worried About Programme researcher, broadcasting/film/video in the Last Year: Not on file  . Ran Out of Food in the Last Year: Not on file  Transportation Needs:   . Lack of Transportation (Medical): Not on file  . Lack of Transportation (Non-Medical): Not on file  Physical Activity:   . Days of Exercise per Week: Not on file  . Minutes of Exercise per Session: Not on file  Stress:   . Feeling of Stress : Not on file  Social Connections:   . Frequency of Communication with Friends and Family: Not on file  . Frequency of Social Gatherings with Friends and Family: Not on file  . Attends Religious Services: Not on file  . Active Member of Clubs or Organizations: Not on file  . Attends Banker Meetings: Not on file  . Marital Status: Not on file  Intimate Partner Violence:   . Fear of Current or Ex-Partner: Not on file  . Emotionally Abused: Not on file  . Physically Abused: Not on file  . Sexually Abused: Not on file   Family History  Problem Relation Age of Onset  . Diabetes Mellitus II Mother   . Hypertension Mother   . Seizures Mother   . Other Mother        post stroke headaches  . Stroke Mother   . Hypertension Father   . Diabetes Mellitus II Father   . Hyperlipidemia Father    . Bipolar disorder Sister   . Asthma Son   . Autism Son   . Tourette syndrome Son   . Autism Daughter        level 3  . Other Daughter        tibia torsion & femoral aniversion    OBJECTIVE:  Vitals:   05/24/20 1445  BP: 118/76  Pulse: 93  Resp: 19  Temp: 98 F (36.7 C)  SpO2: 97%     Physical Exam Vitals and nursing note reviewed.  Constitutional:      General: She is not in acute distress.    Appearance: Normal appearance. She is normal weight. She is not ill-appearing, toxic-appearing or diaphoretic.  HENT:     Head: Normocephalic.  Cardiovascular:     Rate and Rhythm: Normal rate and regular rhythm.     Pulses: Normal pulses.     Heart sounds: Normal heart sounds. No murmur heard.  No friction rub. No gallop.   Pulmonary:     Effort: Pulmonary effort is normal. No respiratory distress.     Breath sounds: Normal breath sounds. No stridor. No wheezing, rhonchi or rales.  Chest:     Chest wall: No tenderness.  Musculoskeletal:        General: Tenderness present.     Right shoulder: Normal.     Left shoulder: Tenderness present.     Comments: The left shoulder is without any obvious asymmetry or deformity when compared to the right shoulder.  There is no ecchymosis, open wound, swelling, lesion or warmth present.  Tenderness on palpation.  Limited range of motion due to pain.  Neurovascular status intact.    Neurological:     Mental Status: She is alert and oriented to  person, place, and time.      LABS:  No results found for this or any previous visit (from the past 24 hour(s)).   ASSESSMENT & PLAN:  1. Acute pain of left shoulder   2. Tendinitis of left shoulder   3. Muscle spasm of left shoulder     Meds ordered this encounter  Medications  . dexamethasone (DECADRON) injection 10 mg  . predniSONE (DELTASONE) 10 MG tablet    Sig: Take 2 tablets (20 mg total) by mouth daily.    Dispense:  15 tablet    Refill:  0  . tiZANidine (ZANAFLEX) 4 MG  capsule    Sig: Take 1 capsule (4 mg total) by mouth 3 (three) times daily as needed for muscle spasms.    Dispense:  30 capsule    Refill:  0    Discharge instructions   Decadron IM was given in the office Prednisone was prescribed/take as directed Zanaflex was prescribed for muscle spasm.  This medication can make you sleepy so do not operate any machinery or driving while taking this medication Continue to take OTC Tylenol 500 mg as needed for pain Follow RICE instructions that is  attached Follow-up with PCP May use the shoulder sling Return or go to ED if you develop any new or worsening of symptoms   Reviewed expectations re: course of current medical issues. Questions answered. Outlined signs and symptoms indicating need for more acute intervention. Patient verbalized understanding. After Visit Summary given.         Durward Parcelvegno, Zayra Devito S, FNP 05/24/20 1506

## 2020-05-28 ENCOUNTER — Emergency Department (HOSPITAL_COMMUNITY)
Admission: EM | Admit: 2020-05-28 | Discharge: 2020-05-28 | Disposition: A | Discharge status: Home / Self Care | Payer: BC Managed Care – PPO | Attending: Emergency Medicine | Admitting: Emergency Medicine

## 2020-05-28 ENCOUNTER — Encounter (HOSPITAL_COMMUNITY): Payer: Self-pay | Admitting: Emergency Medicine

## 2020-05-28 ENCOUNTER — Other Ambulatory Visit: Payer: Self-pay

## 2020-05-28 DIAGNOSIS — Z7984 Long term (current) use of oral hypoglycemic drugs: Secondary | ICD-10-CM | POA: Insufficient documentation

## 2020-05-28 DIAGNOSIS — F329 Major depressive disorder, single episode, unspecified: Secondary | ICD-10-CM | POA: Diagnosis not present

## 2020-05-28 DIAGNOSIS — E119 Type 2 diabetes mellitus without complications: Secondary | ICD-10-CM | POA: Diagnosis not present

## 2020-05-28 DIAGNOSIS — M5412 Radiculopathy, cervical region: Secondary | ICD-10-CM | POA: Diagnosis not present

## 2020-05-28 DIAGNOSIS — Z87891 Personal history of nicotine dependence: Secondary | ICD-10-CM | POA: Insufficient documentation

## 2020-05-28 DIAGNOSIS — E039 Hypothyroidism, unspecified: Secondary | ICD-10-CM | POA: Diagnosis not present

## 2020-05-28 DIAGNOSIS — X503XXD Overexertion from repetitive movements, subsequent encounter: Secondary | ICD-10-CM | POA: Insufficient documentation

## 2020-05-28 DIAGNOSIS — F419 Anxiety disorder, unspecified: Secondary | ICD-10-CM | POA: Insufficient documentation

## 2020-05-28 DIAGNOSIS — Z79899 Other long term (current) drug therapy: Secondary | ICD-10-CM | POA: Diagnosis not present

## 2020-05-28 DIAGNOSIS — S4992XD Unspecified injury of left shoulder and upper arm, subsequent encounter: Secondary | ICD-10-CM | POA: Insufficient documentation

## 2020-05-28 DIAGNOSIS — Y990 Civilian activity done for income or pay: Secondary | ICD-10-CM | POA: Diagnosis not present

## 2020-05-28 DIAGNOSIS — I10 Essential (primary) hypertension: Secondary | ICD-10-CM | POA: Insufficient documentation

## 2020-05-28 MED ORDER — MELOXICAM 15 MG PO TABS: 15.0000 mg | ORAL_TABLET | Freq: Every day | ORAL | 0 refills | Status: DC

## 2020-05-28 MED ORDER — METHOCARBAMOL 500 MG PO TABS: 500.0000 mg | ORAL_TABLET | Freq: Two times a day (BID) | ORAL | 0 refills | Status: DC

## 2020-05-28 MED ORDER — LIDOCAINE 5 % EX PTCH
1.0000 | MEDICATED_PATCH | CUTANEOUS | Status: DC
  Administered 2020-05-28: 1 via TRANSDERMAL
  Filled 2020-05-28: qty 1

## 2020-05-28 NOTE — Discharge Instructions (Addendum)
STOP the tizanidine and prednisone.  Also discontinue your ibuprofen. Take Robaxin and meloxicam as needed as prescribed. Limited use of shoulder sling, remove arm several times throughout the day for gentle range of motion exercises as discussed. Follow-up with orthopedics.

## 2020-05-28 NOTE — ED Triage Notes (Addendum)
Pt c/o left arm pain from her left shoulder to left wrist and weakness. Pt states she works at [employer] office and she was "slinging puppies" Friday and started having pain in her left arm on Saturday, pt was seen at urgent care on Monday and was given oral steroids and an shot of steroids and pt states it hasnt gotten any better.

## 2020-05-28 NOTE — ED Notes (Signed)
ED Provider at bedside. 

## 2020-05-28 NOTE — ED Provider Notes (Signed)
[facility] EMERGENCY DEPARTMENT Provider Note   CSN: [id number] Arrival date & time: 05/28/20  1035     History Chief Complaint  Patient presents with  . Arm Injury    Sonya Gibson is a 34 y.o. female.  34 year old female with complaint of left arm pain. 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 Patient took Motrin and went to UC on Monday where she was given IM steroids and rx for Zanaflex and Prednisone which she is taking without improvement. Pain is worse with movement of the arm and improves with holding her left axillary area. Patient is right handed, reports left hand weakness with tingling in the 4th and 5th fingers. No other complaints or concerns.         Past Medical History:  Diagnosis Date  . Anxiety   . Chronic back pain   . Depression   . Diabetes (HCC)   . Drug-seeking behavior   . Essential hypertension   . GERD (gastroesophageal reflux disease)   . Hypothyroidism   . Kidney stone   . Lumbar radiculopathy, right   . Migraine headache   . PTSD (post-traumatic stress disorder)     Patient Active Problem List   Diagnosis Date Noted  . Morbid obesity with BMI of 60.0-69.9, adult (HCC) 07/10/2018  . Headache due to low cerebrospinal fluid pressure 07/09/2018  . Postoperative CSF leak 07/09/2018  . 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 DM (diabetes mellitus) (HCC) 07/08/2018  . Headache 07/02/2018  . Vaginal bleeding 07/02/2018  . CSF leak 12/20/2017  . Pressure injury of skin 12/20/2017  . Lumbar radiculopathy   . S/P lumbar laminectomy 12/05/2017  . Perineal numbness 11/30/2017  . Weakness of right lower extremity 11/17/2017  . Lower back pain 11/15/2017  . Hypothyroidism   . Anxiety    . Essential hypertension   . PTSD (post-traumatic stress disorder)   . Decreased sensation of leg   . GESTATIONAL DIABETES 04/26/2010    Past Surgical History:  Procedure Laterality Date  . ANTERIOR CRUCIATE LIGAMENT REPAIR Left   . BACK SURGERY  12/05/2017  . CESAREAN SECTION    . IR EPIDUROGRAPHY  11/17/2017  . KNEE SURGERY    . LUMBAR LAMINECTOMY/DECOMPRESSION MICRODISCECTOMY Right 12/05/2017   Procedure: Right L4-5 Microdiskectomy;  Surgeon: Tia Alert, MD;  Location: [facility] OR;  Service: Neurosurgery;  Laterality: Right;  . LUMBAR LAMINECTOMY/DECOMPRESSION MICRODISCECTOMY N/A 12/20/2017   Procedure: Repair of CSF Leak;  Surgeon: Tia Alert, MD;  Location: [facility] OR;  Service: Neurosurgery;  Laterality: N/A;  . LUMBAR WOUND DEBRIDEMENT N/A 12/27/2017   Procedure: Re-exploration for CSF leak repair and placement of lumbar drain;  Surgeon: Tia Alert, MD;  Location: [facility] OR;  Service: Neurosurgery;  Laterality: N/A;  . middle finger reatachment    . PLACEMENT OF LUMBAR DRAIN N/A 12/27/2017   Procedure: PLACEMENT OF LUMBAR DRAIN;  Surgeon: Tia Alert, MD;  Location: [facility] OR;  Service: Neurosurgery;  Laterality: N/A;  . TONSILLECTOMY AND ADENOIDECTOMY  1996  . VENTRICULOPERITONEAL SHUNT N/A 01/08/2018   Procedure: Re-exploration and repair of previous Lumbar cerebro-spinal fluid leak and placement of Lumbar drain;  Surgeon: Tia Alert, MD;  Location: [facility] OR;  Service: Neurosurgery;  Laterality: N/A;     OB History    Gravida  1   Para      Term      Preterm      AB      Living        SAB      IAB      Ectopic      Multiple      Live Births              Family History  Problem Relation Age of Onset  . Diabetes Mellitus II Mother   . Hypertension Mother   . Seizures Mother   . Other Mother        post stroke headaches  . Stroke Mother   . Hypertension Father   . Diabetes Mellitus II Father   . Hyperlipidemia Father   . Bipolar disorder Sister   .  Asthma Son   . Autism Son   . Tourette syndrome Son   . Autism Daughter        level 3  . Other Daughter        tibia torsion & femoral aniversion    Social History   Tobacco Use  . Smoking status: Former Smoker    Packs/day: 2.00    Years: 3.00    Pack years: 6.00    Types: Cigarettes    Quit date: 2006    Years since quitting: 15.9  . Smokeless tobacco: Never Used  Vaping Use  . Vaping Use: Never used  Substance Use Topics  . Alcohol use: Yes    Comment: socially  . Drug use: No    Home Medications Prior to Admission medications   Medication Sig Start Date End Date Taking? Authorizing Provider  acetaminophen (TYLENOL) 325 MG tablet Take 650 mg by mouth every 6 (six) hours as needed.    [provider]  albuterol (VENTOLIN HFA) 108 (90 Base) MCG/ACT inhaler Inhale 2 puffs into the lungs every 6 (six) hours as needed for wheezing or shortness of breath.    [provider]  Butalbital-APAP-Caffeine 50-300-40 MG CAPS Take 1 capsule by mouth every 4 (four) hours as needed (headache).  12/19/17   [provider]  calcium carbonate (TUMS EX) 750 MG chewable tablet Chew 2 tablets by mouth as needed for heartburn.    [provider]  cyanocobalamin (,VITAMIN B-12,) 1000 MCG/ML injection Inject 1,000 mcg into the muscle See admin instructions. Every two weeks 02/16/18   [provider]  dicyclomine (BENTYL) 20 MG tablet Take one po q8h prn abd cramps 02/21/20   Bethann BerkshireZammit, Joseph, MD  DULoxetine (CYMBALTA) 60 MG capsule Take 120 mg by mouth daily.    [provider]  gabapentin (NEURONTIN) 600 MG tablet Take 600 mg by mouth 3 (three) times daily.    [provider]  Melatonin 10 MG TABS Take 10 mg by mouth daily as needed (sleep).    [provider]  meloxicam (MOBIC) 15 MG tablet Take 1 tablet (15 mg total) by mouth daily. 05/28/20   Jeannie FendMurphy, Ladavion Savitz A, PA-C  metFORMIN (GLUCOPHAGE) 500 MG tablet Take 1,000 mg by mouth 2  (two) times daily with a meal.  09/25/17 09/20/18  [provider]  methocarbamol (ROBAXIN) 500 MG tablet Take 1 tablet (500 mg total) by mouth 2 (two) times daily. 05/28/20   Jeannie FendMurphy, Angelgabriel Willmore A, PA-C  pantoprazole (PROTONIX) 20 MG tablet Take 1 tablet (20 mg total) by mouth  daily. 02/21/20   Bethann Berkshire, MD  phenazopyridine (PYRIDIUM) 100 MG tablet Take 1 tablet (100 mg total) by mouth 3 (three) times daily as needed for pain. 10/31/19   Avegno, Zachery Dakins, FNP  Prenatal Vit-Fe Fumarate-FA (PRENATAL MULTIVITAMIN) TABS tablet Take 2 tablets by mouth Nightly.    [provider]  promethazine (PHENERGAN) 25 MG tablet Take 1 tablet (25 mg total) by mouth every 6 (six) hours as needed for nausea or vomiting. 07/06/18   Anson Fret, MD  traZODone (DESYREL) 150 MG tablet Take 150 mg by mouth at bedtime as needed for sleep.    [provider]  Vitamin D, Ergocalciferol, (DRISDOL) 1.25 MG (50000 UT) CAPS capsule Take 50,000 Units by mouth every 7 (seven) days.    [provider]  levETIRAcetam (KEPPRA) 500 MG tablet Take 1 tablet (500 mg total) by mouth 2 (two) times daily. 07/10/18 07/12/19  Catarina Hartshorn, MD  metoprolol tartrate (LOPRESSOR) 25 MG tablet Take 0.5 tablets (12.5 mg total) by mouth 2 (two) times daily. 07/10/18 06/02/19  Catarina Hartshorn, MD  venlafaxine XR (EFFEXOR-XR) 75 MG 24 hr capsule Take 75 mg by mouth at bedtime.  12/12/17 06/02/19  [provider]    Allergies    Cinnamon, Coconut oil, Lovenox [enoxaparin], Nitrofurantoin, and Other  Review of Systems   Review of Systems  Constitutional: Negative for chills and fever.  Respiratory: Negative for shortness of breath.   Cardiovascular: Negative for chest pain.  Musculoskeletal: Positive for arthralgias, joint swelling, myalgias and neck pain. Negative for back pain.  Skin: Negative for color change, rash and wound.  Allergic/Immunologic: Negative for immunocompromised state.  Neurological: Positive for  weakness and numbness.  All other systems reviewed and are negative.   Physical Exam Updated Vital Signs BP 132/89 (BP Location: Right Arm)   Pulse (!) 109   Temp 98.5 F (36.9 C) (Oral)   Resp 17   Ht 5\' 2"  (1.575 m)   Wt (!) 137 kg   SpO2 99%   BMI 55.24 kg/m   Physical Exam Vitals and nursing note reviewed.  Constitutional:      General: She is not in acute distress.    Appearance: She is well-developed and well-nourished. She is not diaphoretic.  HENT:     Head: Normocephalic and atraumatic.  Cardiovascular:     Pulses: Normal pulses.  Pulmonary:     Effort: Pulmonary effort is normal.  Musculoskeletal:        General: Tenderness present. No swelling.     Right shoulder: Normal.     Left shoulder: Tenderness present. No crepitus. Normal range of motion. Decreased strength. Normal pulse.       Back:     Comments: Tenderness along lateral left forearm, left trapezius, medial border of left scapula. Sensation intact, brisk capillary refill to each digit.   Skin:    General: Skin is warm and dry.     Findings: No erythema or rash.  Neurological:     Mental Status: She is alert and oriented to person, place, and time.     Sensory: No sensory deficit.     Motor: Weakness present.  Psychiatric:        Mood and Affect: Mood and affect normal.        Behavior: Behavior normal.     ED Results / Procedures / Treatments   Labs (all labs ordered are listed, but only abnormal results are displayed) Labs Reviewed - No data to display  EKG None  Radiology No results found.  Procedures Procedures (including critical care time)  Medications Ordered in ED Medications  lidocaine (LIDODERM) 5 % 1 patch (1 patch Transdermal Patch Applied 05/28/20 1442)    ED Course  I have reviewed the triage vital signs and the nursing notes.  Pertinent labs & imaging results that were available during my care of the patient were reviewed by me and considered in my medical decision  making (see chart for details).  Clinical Course as of 05/28/20 1647  Thu May 28, 2020  739 35 year old female with complaint of left shoulder and arm pain as described above.  On exam, tenderness along left trapezius and medial border left scapula with tenderness to the left forearm.  Pulses present, sensation intact.  Also found to have decreased grip strength in the left hand.  Blood pressure is improved on recheck.  Plan is to place Lidoderm patch, advised to discontinue all NSAIDs including her prednisone and tizanidine.  Will start on a meloxicam and Robaxin, given sling for comfort although advised to remove arm from sling regularly for gentle range of motion exercises to prevent adhesive capsulitis.  Patient referred to orthopedics for follow-up. [LM]    Clinical Course User Index [LM] Alden Hipp   MDM Rules/Calculators/A&P                          Final Clinical Impression(s) / ED Diagnoses Final diagnoses:  Injury of left upper extremity, subsequent encounter  Cervical radiculopathy    Rx / DC Orders ED Discharge Orders         Ordered    methocarbamol (ROBAXIN) 500 MG tablet  2 times daily        05/28/20 1458    meloxicam (MOBIC) 15 MG tablet  Daily        05/28/20 1458           Jeannie Fend, PA-C 05/28/20 1648    Bethann Berkshire, MD 05/29/20 1029

## 2020-06-01 ENCOUNTER — Telehealth: Payer: Self-pay | Admitting: Orthopedic Surgery

## 2020-06-01 NOTE — Telephone Encounter (Signed)
I followed up with patient, and she relayed she has already been scheduled at [facility] for appointment and pending MRI. Thanked [place] for calling.

## 2020-06-01 NOTE — Telephone Encounter (Signed)
Call received from patient this morning regarding Sonya Gibson Emergency room visit 05/28/20 for knee problem - said no Xrays were done at that time. Patient is asking for appointment as soon as possible. Relayed we do not have opening today - Dr Dallas Schimke is in surgery today; aware we are checking providers' schedules and will call back to patient.

## 2020-06-04 ENCOUNTER — Emergency Department (HOSPITAL_COMMUNITY): Payer: BC Managed Care – PPO

## 2020-06-04 ENCOUNTER — Other Ambulatory Visit: Payer: Self-pay

## 2020-06-04 ENCOUNTER — Emergency Department (HOSPITAL_COMMUNITY)
Admission: EM | Admit: 2020-06-04 | Discharge: 2020-06-04 | Disposition: A | Discharge status: Home / Self Care | Payer: BC Managed Care – PPO | Attending: Emergency Medicine | Admitting: Emergency Medicine

## 2020-06-04 ENCOUNTER — Encounter (HOSPITAL_COMMUNITY): Payer: Self-pay | Admitting: Emergency Medicine

## 2020-06-04 DIAGNOSIS — Z7984 Long term (current) use of oral hypoglycemic drugs: Secondary | ICD-10-CM | POA: Insufficient documentation

## 2020-06-04 DIAGNOSIS — Z95828 Presence of other vascular implants and grafts: Secondary | ICD-10-CM | POA: Diagnosis not present

## 2020-06-04 DIAGNOSIS — I10 Essential (primary) hypertension: Secondary | ICD-10-CM | POA: Insufficient documentation

## 2020-06-04 DIAGNOSIS — Z79899 Other long term (current) drug therapy: Secondary | ICD-10-CM | POA: Diagnosis not present

## 2020-06-04 DIAGNOSIS — M542 Cervicalgia: Secondary | ICD-10-CM | POA: Diagnosis not present

## 2020-06-04 DIAGNOSIS — E119 Type 2 diabetes mellitus without complications: Secondary | ICD-10-CM | POA: Insufficient documentation

## 2020-06-04 DIAGNOSIS — M25512 Pain in left shoulder: Secondary | ICD-10-CM | POA: Insufficient documentation

## 2020-06-04 DIAGNOSIS — Z87891 Personal history of nicotine dependence: Secondary | ICD-10-CM | POA: Diagnosis not present

## 2020-06-04 DIAGNOSIS — E039 Hypothyroidism, unspecified: Secondary | ICD-10-CM | POA: Insufficient documentation

## 2020-06-04 DIAGNOSIS — M79602 Pain in left arm: Secondary | ICD-10-CM | POA: Insufficient documentation

## 2020-06-04 MED ORDER — OXYCODONE-ACETAMINOPHEN 5-325 MG PO TABS
1.0000 | ORAL_TABLET | Freq: Once | ORAL | Status: AC
  Administered 2020-06-04: 07:00:00 1 via ORAL
  Filled 2020-06-04: qty 1

## 2020-06-04 MED ORDER — OXYCODONE-ACETAMINOPHEN 5-325 MG PO TABS: 1.0000 | ORAL_TABLET | Freq: Four times a day (QID) | ORAL | 0 refills | Status: DC | PRN

## 2020-06-04 MED ORDER — CYCLOBENZAPRINE HCL 10 MG PO TABS
10.0000 mg | ORAL_TABLET | Freq: Once | ORAL | Status: AC
  Administered 2020-06-04: 07:00:00 10 mg via ORAL
  Filled 2020-06-04: qty 1

## 2020-06-04 MED ORDER — OXYCODONE-ACETAMINOPHEN 5-325 MG PO TABS
1.0000 | ORAL_TABLET | Freq: Once | ORAL | Status: AC
  Administered 2020-06-04: 11:00:00 1 via ORAL
  Filled 2020-06-04: qty 1

## 2020-06-04 NOTE — ED Provider Notes (Signed)
[facility] EMERGENCY DEPARTMENT Provider Note   CSN: [id number] Arrival date & time: 06/04/20  0160   History Chief Complaint  Patient presents with  . Arm Pain    Sonya Gibson is a 34 y.o. female.  The history is provided by the patient.  Arm Pain  She has history of hypertension, diabetes, chronic back pain who comes in complaining of ongoing pain in her neck and left shoulder.  Pain started 11 days ago.  The day before pain started, she had assisted in a C-section of a dog and had to do a maneuver to help clear fluid from the puppies lungs which involves raising them over her head and forcefully throwing her hand down.  The next day, she noted pain in her left shoulder and went to urgent care where she was given a steroid injection and prescriptions for prednisone and tizanidine.  Pain did not improve and pain would radiate down her arm as far as her wrist and she does have intermittent numbness of the left hand.  She was seen in the emergency department 1 week ago and given a prescription for meloxicam and methocarbamol.  She saw her orthopedic doctor who has ordered an MRI of her neck, but it is not scheduled to be done for another 2 days.  She is complaining of pain at 8/10 and has not gotten relief from anything that she is taken for pain.  Past Medical History:  Diagnosis Date  . Anxiety   . Chronic back pain   . Depression   . Diabetes (HCC)   . Drug-seeking behavior   . Essential hypertension   . GERD (gastroesophageal reflux disease)   . Hypothyroidism   . Kidney stone   . Lumbar radiculopathy, right   . Migraine headache   . PTSD (post-traumatic stress disorder)     Patient Active Problem List   Diagnosis Date Noted  . Morbid obesity with BMI of 60.0-69.9, adult (HCC) 07/10/2018  . Headache due to low cerebrospinal fluid pressure 07/09/2018  . Postoperative CSF leak 07/09/2018  . 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 DM (diabetes mellitus) (HCC)  07/08/2018  . Headache 07/02/2018  . Vaginal bleeding 07/02/2018  . CSF leak 12/20/2017  . Pressure injury of skin 12/20/2017  . Lumbar radiculopathy   . S/P lumbar laminectomy 12/05/2017  . Perineal numbness 11/30/2017  . Weakness of right lower extremity 11/17/2017  . Lower back pain 11/15/2017  . Hypothyroidism   . Anxiety   . Essential hypertension   . PTSD (post-traumatic stress disorder)   . Decreased sensation of leg   . GESTATIONAL DIABETES 04/26/2010    Past Surgical History:  Procedure Laterality Date  . ANTERIOR CRUCIATE LIGAMENT REPAIR Left   . BACK SURGERY  12/05/2017  . CESAREAN SECTION    . IR EPIDUROGRAPHY  11/17/2017  . KNEE SURGERY    . LUMBAR LAMINECTOMY/DECOMPRESSION MICRODISCECTOMY Right 12/05/2017   Procedure: Right L4-5 Microdiskectomy;  Surgeon: Tia Alert, MD;  Location: [facility] OR;  Service: Neurosurgery;  Laterality: Right;  . LUMBAR LAMINECTOMY/DECOMPRESSION MICRODISCECTOMY N/A 12/20/2017   Procedure: Repair of CSF Leak;  Surgeon: Tia Alert, MD;  Location: [facility] OR;  Service: Neurosurgery;  Laterality: N/A;  . LUMBAR WOUND DEBRIDEMENT N/A 12/27/2017   Procedure: Re-exploration for CSF leak repair and placement of lumbar drain;  Surgeon: Tia Alert, MD;  Location: [facility] OR;  Service: Neurosurgery;  Laterality: N/A;  . middle finger reatachment    . PLACEMENT OF LUMBAR  DRAIN N/A 12/27/2017   Procedure: PLACEMENT OF LUMBAR DRAIN;  Surgeon: Tia Alert, MD;  Location: [facility] OR;  Service: Neurosurgery;  Laterality: N/A;  . TONSILLECTOMY AND ADENOIDECTOMY  1996  . VENTRICULOPERITONEAL SHUNT N/A 01/08/2018   Procedure: Re-exploration and repair of previous Lumbar cerebro-spinal fluid leak and placement of Lumbar drain;  Surgeon: Tia Alert, MD;  Location: [facility] OR;  Service: Neurosurgery;  Laterality: N/A;     OB History    Gravida  1   Para      Term      Preterm      AB      Living        SAB      IAB      Ectopic      Multiple      Live  Births              Family History  Problem Relation Age of Onset  . Diabetes Mellitus II Mother   . Hypertension Mother   . Seizures Mother   . Other Mother        post stroke headaches  . Stroke Mother   . Hypertension Father   . Diabetes Mellitus II Father   . Hyperlipidemia Father   . Bipolar disorder Sister   . Asthma Son   . Autism Son   . Tourette syndrome Son   . Autism Daughter        level 3  . Other Daughter        tibia torsion & femoral aniversion    Social History   Tobacco Use  . Smoking status: Former Smoker    Packs/day: 2.00    Years: 3.00    Pack years: 6.00    Types: Cigarettes    Quit date: 2006    Years since quitting: 15.9  . Smokeless tobacco: Never Used  Vaping Use  . Vaping Use: Never used  Substance Use Topics  . Alcohol use: Yes    Comment: socially  . Drug use: No    Home Medications Prior to Admission medications   Medication Sig Start Date End Date Taking? Authorizing Provider  acetaminophen (TYLENOL) 325 MG tablet Take 650 mg by mouth every 6 (six) hours as needed.    [provider]  albuterol (VENTOLIN HFA) 108 (90 Base) MCG/ACT inhaler Inhale 2 puffs into the lungs every 6 (six) hours as needed for wheezing or shortness of breath.    [provider]  Butalbital-APAP-Caffeine 50-300-40 MG CAPS Take 1 capsule by mouth every 4 (four) hours as needed (headache).  12/19/17   [provider]  calcium carbonate (TUMS EX) 750 MG chewable tablet Chew 2 tablets by mouth as needed for heartburn.    [provider]  cyanocobalamin (,VITAMIN B-12,) 1000 MCG/ML injection Inject 1,000 mcg into the muscle See admin instructions. Every two weeks 02/16/18   [provider]  dicyclomine (BENTYL) 20 MG tablet Take one po q8h prn abd cramps 02/21/20   Bethann Berkshire, MD  DULoxetine (CYMBALTA) 60 MG capsule Take 120 mg by mouth daily.    [provider]  gabapentin (NEURONTIN) 600 MG tablet Take 600  mg by mouth 3 (three) times daily.    [provider]  Melatonin 10 MG TABS Take 10 mg by mouth daily as needed (sleep).    [provider]  meloxicam (MOBIC) 15 MG tablet Take 1 tablet (15 mg total) by mouth daily. 05/28/20   Eulah Pont,  Gerome ApleyLaura A, PA-C  metFORMIN (GLUCOPHAGE) 500 MG tablet Take 1,000 mg by mouth 2 (two) times daily with a meal.  09/25/17 09/20/18  [provider]  methocarbamol (ROBAXIN) 500 MG tablet Take 1 tablet (500 mg total) by mouth 2 (two) times daily. 05/28/20   Jeannie FendMurphy, Laura A, PA-C  pantoprazole (PROTONIX) 20 MG tablet Take 1 tablet (20 mg total) by mouth daily. 02/21/20   Bethann BerkshireZammit, Joseph, MD  phenazopyridine (PYRIDIUM) 100 MG tablet Take 1 tablet (100 mg total) by mouth 3 (three) times daily as needed for pain. 10/31/19   Avegno, Zachery DakinsKomlanvi S, FNP  Prenatal Vit-Fe Fumarate-FA (PRENATAL MULTIVITAMIN) TABS tablet Take 2 tablets by mouth Nightly.    [provider]  promethazine (PHENERGAN) 25 MG tablet Take 1 tablet (25 mg total) by mouth every 6 (six) hours as needed for nausea or vomiting. 07/06/18   Anson FretAhern, Antonia B, MD  traZODone (DESYREL) 150 MG tablet Take 150 mg by mouth at bedtime as needed for sleep.    [provider]  Vitamin D, Ergocalciferol, (DRISDOL) 1.25 MG (50000 UT) CAPS capsule Take 50,000 Units by mouth every 7 (seven) days.    [provider]  levETIRAcetam (KEPPRA) 500 MG tablet Take 1 tablet (500 mg total) by mouth 2 (two) times daily. 07/10/18 07/12/19  Catarina Hartshornat, Tandy Grawe, MD  metoprolol tartrate (LOPRESSOR) 25 MG tablet Take 0.5 tablets (12.5 mg total) by mouth 2 (two) times daily. 07/10/18 06/02/19  Catarina Hartshornat, Jaimie Pippins, MD  venlafaxine XR (EFFEXOR-XR) 75 MG 24 hr capsule Take 75 mg by mouth at bedtime.  12/12/17 06/02/19  [provider]    Allergies    Cinnamon, Coconut oil, Lovenox [enoxaparin], Nitrofurantoin, and Other  Review of Systems   Review of Systems  All other systems reviewed and are negative.   Physical  Exam Updated Vital Signs BP 139/83   Pulse 100   Temp 98.2 F (36.8 C)   Resp 18   Ht 5\' 2"  (1.575 m)   Wt 136.1 kg   LMP 04/25/2020   SpO2 99%   BMI 54.87 kg/m   Physical Exam Vitals and nursing note reviewed.   Obese 34 year old female, appears uncomfortable, but is in no acute distress. Vital signs are normal. Oxygen saturation is 99%, which is normal. Head is normocephalic and atraumatic. PERRLA, EOMI. Oropharynx is clear. Neck is markedly tender diffusely over the posterior neck. Back is nontender and there is no CVA tenderness. Lungs are clear without rales, wheezes, or rhonchi. Chest is nontender. Heart has regular rate and rhythm without murmur. Abdomen is soft, flat, nontender without masses or hepatosplenomegaly and peristalsis is normoactive. Extremities: Tenderness is noted along the left deltoid muscle and the anterior deltoid groove.  There is pain on passive range of motion with rotator cuff impingement signs present.  Distal neurovascular exam is intact with strong pulses, prompt capillary refill, normal sensation.  Objective strength of all muscles in the right arm is 5/5. Skin is warm and dry without rash. Neurologic: Mental status is normal, cranial nerves are intact, there are no motor or sensory deficits.  ED Results / Procedures / Treatments    Radiology No results found.  Procedures Procedures  Medications Ordered in ED Medications  oxyCODONE-acetaminophen (PERCOCET/ROXICET) 5-325 MG per tablet 1 tablet (has no administration in time range)  cyclobenzaprine (FLEXERIL) tablet 10 mg (has no administration in time range)    ED Course  I have reviewed the triage vital signs and the nursing notes.  Pertinent imaging  results that were available during my care of the patient were reviewed by me and considered in my medical decision making (see chart for details).  MDM Rules/Calculators/A&P Pain left shoulder, arm, neck.  Exam is most suggestive of  rotator cuff injury, but cervical radiculopathy is certainly a possibility as well.  Old records are reviewed confirming recent urgent care and emergency visits.  She will be sent for MRI of her shoulder and cervical spine.  She is given a dose of oxycodone-acetaminophen and cyclobenzaprine in the ED.  Case is signed out to Dr. Deretha Emory.  Final Clinical Impression(s) / ED Diagnoses Final diagnoses:  Acute pain of left shoulder  Acute neck pain    Rx / DC Orders ED Discharge Orders    None       Dione Booze, MD 06/04/20 [phone]

## 2020-06-04 NOTE — ED Provider Notes (Signed)
Patient turned over to me.  Patient was pending MRI left shoulder MRI cervical spine.  Abnormalities found on both.  Some evidence of perhaps rotator cuff tear to the left shoulder area.  And also evidence of disc protrusion at C6-C7.  This could also represent some cervical radiculopathy.  Patient has a sling and shoulder immobilizer already.  Followed by [facility] has follow-up with them.  We will give a short course of Percocet to help with the pain.  Work note provided.   Vanetta Mulders, MD 06/04/20 1133

## 2020-06-04 NOTE — ED Notes (Signed)
Pt crying in hallway. Assess pain and pt states pain of 9/10 and the pain meds given earlier did not work. Pt was given heat pack and informed that MRI will be with her in about a hour.

## 2020-06-04 NOTE — Discharge Instructions (Signed)
Wear the sling for comfort.  Make an appointment to follow-up with [facility] keep the appointment that she have.  MRI showed evidence of possible rotator cuff tear to the left arm.  Also showed some evidence of herniated disc which could be resulting in cervical radiculopathy.  Take the Percocet pain medicine as directed.

## 2020-06-04 NOTE — ED Triage Notes (Signed)
Pt c/o left arm pain and weakness with headache and blurry vision since 05/21/20. Pt has mri scheduled Sat.

## 2020-06-04 NOTE — ED Notes (Signed)
Pt transport to MRI

## 2020-06-05 DIAGNOSIS — M50223 Other cervical disc displacement at C6-C7 level: Secondary | ICD-10-CM | POA: Insufficient documentation

## 2020-06-05 DIAGNOSIS — R52 Pain, unspecified: Secondary | ICD-10-CM | POA: Insufficient documentation

## 2020-06-05 DIAGNOSIS — E119 Type 2 diabetes mellitus without complications: Secondary | ICD-10-CM | POA: Insufficient documentation

## 2020-06-05 DIAGNOSIS — R29898 Other symptoms and signs involving the musculoskeletal system: Secondary | ICD-10-CM | POA: Insufficient documentation

## 2020-06-22 DIAGNOSIS — R1310 Dysphagia, unspecified: Secondary | ICD-10-CM | POA: Insufficient documentation

## 2020-07-30 ENCOUNTER — Encounter (HOSPITAL_COMMUNITY): Payer: Self-pay | Admitting: Emergency Medicine

## 2020-07-30 ENCOUNTER — Other Ambulatory Visit: Payer: Self-pay

## 2020-07-30 ENCOUNTER — Emergency Department (HOSPITAL_COMMUNITY)
Admission: EM | Admit: 2020-07-30 | Discharge: 2020-07-31 | Disposition: A | Discharge status: Home / Self Care | Payer: BC Managed Care – PPO | Attending: Emergency Medicine | Admitting: Emergency Medicine

## 2020-07-30 DIAGNOSIS — R519 Headache, unspecified: Secondary | ICD-10-CM | POA: Insufficient documentation

## 2020-07-30 DIAGNOSIS — W1839XA Other fall on same level, initial encounter: Secondary | ICD-10-CM | POA: Diagnosis not present

## 2020-07-30 DIAGNOSIS — Y92009 Unspecified place in unspecified non-institutional (private) residence as the place of occurrence of the external cause: Secondary | ICD-10-CM | POA: Diagnosis not present

## 2020-07-30 DIAGNOSIS — E039 Hypothyroidism, unspecified: Secondary | ICD-10-CM | POA: Diagnosis not present

## 2020-07-30 DIAGNOSIS — Z87891 Personal history of nicotine dependence: Secondary | ICD-10-CM | POA: Insufficient documentation

## 2020-07-30 DIAGNOSIS — I10 Essential (primary) hypertension: Secondary | ICD-10-CM | POA: Insufficient documentation

## 2020-07-30 DIAGNOSIS — E119 Type 2 diabetes mellitus without complications: Secondary | ICD-10-CM | POA: Insufficient documentation

## 2020-07-30 DIAGNOSIS — S34105A Unspecified injury to L5 level of lumbar spinal cord, initial encounter: Secondary | ICD-10-CM | POA: Diagnosis present

## 2020-07-30 DIAGNOSIS — S3421XA Injury of nerve root of lumbar spine, initial encounter: Secondary | ICD-10-CM | POA: Diagnosis not present

## 2020-07-30 DIAGNOSIS — M79604 Pain in right leg: Secondary | ICD-10-CM | POA: Diagnosis not present

## 2020-07-30 LAB — I-STAT BETA HCG BLOOD, ED (MC, WL, AP ONLY): I-stat hCG, quantitative: <5 m[IU]/mL (ref <5)

## 2020-07-30 LAB — CBC WITH DIFFERENTIAL/PLATELET
Abs Immature Granulocytes: 0.02 10*3/uL (ref 0.00–0.07)
Basophils Absolute: 0.1 10*3/uL (ref 0.0–0.1)
Basophils Relative: 1 %
Eosinophils Absolute: 0.3 10*3/uL (ref 0.0–0.5)
Eosinophils Relative: 3 %
HCT: 40.2 % (ref 36.0–46.0)
Hemoglobin: 12.5 g/dL (ref 12.0–15.0)
Immature Granulocytes: 0 %
Lymphocytes Relative: 43 %
Lymphs Abs: 4.2 10*3/uL — ABNORMAL HIGH (ref 0.7–4.0)
MCH: 27.2 pg (ref 26.0–34.0)
MCHC: 31.1 g/dL (ref 30.0–36.0)
MCV: 87.6 fL (ref 80.0–100.0)
Monocytes Absolute: 0.4 10*3/uL (ref 0.1–1.0)
Monocytes Relative: 4 %
Neutro Abs: 4.7 10*3/uL (ref 1.7–7.7)
Neutrophils Relative %: 49 %
Platelets: 357 10*3/uL (ref 150–400)
RBC: 4.59 MIL/uL (ref 3.87–5.11)
RDW: 14.8 % (ref 11.5–15.5)
WBC: 9.7 10*3/uL (ref 4.0–10.5)
nRBC: 0 % (ref 0.0–0.2)

## 2020-07-30 LAB — BASIC METABOLIC PANEL
Anion gap: 11 (ref 5–15)
BUN: 9 mg/dL (ref 6–20)
CO2: 26 mmol/L (ref 22–32)
Calcium: 8.9 mg/dL (ref 8.9–10.3)
Chloride: 99 mmol/L (ref 98–111)
Creatinine, Ser: 0.82 mg/dL (ref 0.44–1.00)
GFR, Estimated: >60 mL/min (ref >60)
Glucose, Bld: 156 mg/dL — ABNORMAL HIGH (ref 70–99)
Potassium: 3.9 mmol/L (ref 3.5–5.1)
Sodium: 136 mmol/L (ref 135–145)

## 2020-07-30 NOTE — ED Triage Notes (Signed)
Patient lost her balance and fell at her right side at home this morning , denies LOC/alert and oriented , ambulatory reports occipital headache , right leg pain with numbness . Respirations unlabored , speech clear , no fever or chills .

## 2020-07-31 ENCOUNTER — Emergency Department (HOSPITAL_COMMUNITY): Payer: BC Managed Care – PPO

## 2020-07-31 MED ORDER — LORAZEPAM 2 MG/ML IJ SOLN
1.0000 mg | Freq: Once | INTRAMUSCULAR | Status: AC
  Administered 2020-07-31: 1 mg via INTRAMUSCULAR
  Filled 2020-07-31: qty 1

## 2020-07-31 MED ORDER — HYDROMORPHONE HCL 1 MG/ML IJ SOLN
INTRAMUSCULAR | Status: AC
  Filled 2020-07-31: qty 1

## 2020-07-31 MED ORDER — HYDROMORPHONE HCL 1 MG/ML IJ SOLN
1.0000 mg | Freq: Once | INTRAMUSCULAR | Status: AC
  Administered 2020-07-31: 1 mg via INTRAMUSCULAR
  Filled 2020-07-31: qty 1

## 2020-07-31 MED ORDER — PREDNISONE 10 MG PO TABS: ORAL_TABLET | ORAL | 0 refills | Status: DC

## 2020-07-31 MED ORDER — HALOPERIDOL LACTATE 5 MG/ML IJ SOLN
5.0000 mg | Freq: Once | INTRAMUSCULAR | Status: AC
  Administered 2020-07-31: 5 mg via INTRAMUSCULAR
  Filled 2020-07-31: qty 1

## 2020-07-31 NOTE — ED Notes (Signed)
Pt returned from MRI °

## 2020-07-31 NOTE — Discharge Instructions (Addendum)
You were evaluated in the Emergency Department and after careful evaluation, we did not find any emergent condition requiring admission or further testing in the hospital.  Your exam/testing today was overall reassuring.  Symptoms seem to be due to a disc herniation with a pinched nerve.  Please take the steroids as directed and follow-up with your neurosurgeons.  Please return to the Emergency Department if you experience any worsening of your condition.  Thank you for allowing [place] to be a part of your care.

## 2020-07-31 NOTE — ED Provider Notes (Signed)
[facility]-EMERGENCY DEPT [facility] Emergency Department Provider Note MRN:  [id number]  Arrival date & time: 07/31/20     Chief Complaint   Fall / Headache   History of Present Illness   Sonya Gibson is a 35 y.o. year-old female with a history of diabetes, lumbar disc disease presenting to the ED with chief complaint of fall.  Patient explains that she was walking at 9 AM this morning and then her right leg became numb and this caused her to fall.  Denies head trauma or loss of consciousness.  Since then she has been experiencing progressively worsening and now severe right leg pain.  She is also endorsing a "spine headache".  She has a history of CSF leak from a prior lumbar spinal surgery and this caused similar headaches in the past.  She has had urinary issues today since the fall, urinary incontinence, which is very abnormal for her.  Denies fever.  Review of Systems  A complete 10 system review of systems was obtained and all systems are negative except as noted in the HPI and PMH.   Patient's Health History    Past Medical History:  Diagnosis Date  . Anxiety   . Chronic back pain   . Depression   . Diabetes (HCC)   . Drug-seeking behavior   . Essential hypertension   . GERD (gastroesophageal reflux disease)   . Hypothyroidism   . Kidney stone   . Lumbar radiculopathy, right   . Migraine headache   . PTSD (post-traumatic stress disorder)     Past Surgical History:  Procedure Laterality Date  . ANTERIOR CRUCIATE LIGAMENT REPAIR Left   . BACK SURGERY  12/05/2017  . CESAREAN SECTION    . IR EPIDUROGRAPHY  11/17/2017  . KNEE SURGERY    . LUMBAR LAMINECTOMY/DECOMPRESSION MICRODISCECTOMY Right 12/05/2017   Procedure: Right L4-5 Microdiskectomy;  Surgeon: Tia Alert, MD;  Location: [facility] OR;  Service: Neurosurgery;  Laterality: Right;  . LUMBAR LAMINECTOMY/DECOMPRESSION MICRODISCECTOMY N/A 12/20/2017   Procedure: Repair of CSF Leak;  Surgeon: Tia Alert, MD;   Location: [facility] OR;  Service: Neurosurgery;  Laterality: N/A;  . LUMBAR WOUND DEBRIDEMENT N/A 12/27/2017   Procedure: Re-exploration for CSF leak repair and placement of lumbar drain;  Surgeon: Tia Alert, MD;  Location: [facility] OR;  Service: Neurosurgery;  Laterality: N/A;  . middle finger reatachment    . PLACEMENT OF LUMBAR DRAIN N/A 12/27/2017   Procedure: PLACEMENT OF LUMBAR DRAIN;  Surgeon: Tia Alert, MD;  Location: [facility] OR;  Service: Neurosurgery;  Laterality: N/A;  . TONSILLECTOMY AND ADENOIDECTOMY  1996  . VENTRICULOPERITONEAL SHUNT N/A 01/08/2018   Procedure: Re-exploration and repair of previous Lumbar cerebro-spinal fluid leak and placement of Lumbar drain;  Surgeon: Tia Alert, MD;  Location: [facility] OR;  Service: Neurosurgery;  Laterality: N/A;    Family History  Problem Relation Age of Onset  . Diabetes Mellitus II Mother   . Hypertension Mother   . Seizures Mother   . Other Mother        post stroke headaches  . Stroke Mother   . Hypertension Father   . Diabetes Mellitus II Father   . Hyperlipidemia Father   . Bipolar disorder Sister   . Asthma Son   . Autism Son   . Tourette syndrome Son   . Autism Daughter        level 3  . Other Daughter        tibia torsion &  femoral aniversion    Social History   Socioeconomic History  . Marital status: Married    Spouse name: Not on file  . Number of children: 3  . 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 Highest education level: Not on file  Occupational History  . Not on file  Tobacco Use  . Smoking status: Former Smoker    Packs/day: 2.00    Years: 3.00    Pack years: 6.00    Types: Cigarettes    Quit date: 2006    Years since quitting: 16.1  . Smokeless tobacco: Never Used  Vaping Use  . Vaping Use: Never used  Substance and Sexual Activity  . Alcohol use: Yes    Comment: socially  . Drug use: No  . Sexual activity: Yes    Birth control/protection: None  Other Topics Concern  . Not on file  Social History  Narrative   Lives at home with husband and kids   Right handed   Caffeine: 6 cups daily   Social Determinants of Health   Financial Resource Strain: Not on file  Food Insecurity: Not on file  Transportation Needs: Not on file  Physical Activity: Not on file  Stress: Not on file  Social Connections: Not on file  Intimate Partner Violence: Not on file     Physical Exam   Vitals:   07/31/20 0227 07/31/20 0525  BP: (!) 140/103 107/70  Pulse: 95 84  Resp: 17 18  Temp: 97.6 F (36.4 C) 98 F (36.7 C)  SpO2: 100% 95%    CONSTITUTIONAL: Well-appearing, NAD NEURO:  Alert and oriented x 3, no focal deficits EYES:  eyes equal and reactive ENT/NECK:  no LAD, no JVD CARDIO: Regular rate, well-perfused, normal S1 and S2 PULM:  CTAB no wheezing or rhonchi GI/GU:  normal bowel sounds, non-distended, non-tender MSK/SPINE:  No gross deformities, no edema SKIN:  no rash, atraumatic PSYCH:  Appropriate speech and behavior  *Additional and/or pertinent findings included in MDM below  Diagnostic and Interventional Summary    EKG Interpretation  Date/Time:    Ventricular Rate:    PR Interval:    QRS Duration:   QT Interval:    QTC Calculation:   R Axis:     Text Interpretation:        Labs Reviewed  CBC WITH DIFFERENTIAL/PLATELET - Abnormal; Notable for the following components:      Result Value   Lymphs Abs 4.2 (*)    All other components within normal limits  BASIC METABOLIC PANEL - Abnormal; Notable for the following components:   Glucose, Bld 156 (*)    All other components within normal limits  I-STAT BETA HCG BLOOD, ED ([facility], [facility], [facility] ONLY)    MR LUMBAR SPINE WO CONTRAST  Final Result      Medications  haloperidol lactate (HALDOL) injection 5 mg (5 mg Intramuscular Given 07/31/20 0205)  LORazepam (ATIVAN) injection 1 mg (1 mg Intramuscular Given 07/31/20 0313)  HYDROmorphone (DILAUDID) injection 1 mg (1 mg Intramuscular Given 07/31/20 7867)     Procedures  /   Critical Care Procedures  ED Course and Medical Decision Making  I have reviewed the triage vital signs, the nursing notes, and pertinent available records from the EMR.  Listed above are laboratory and imaging tests that I personally ordered, reviewed, and interpreted and then considered in my medical decision making (see below for details).  With the worsening pain, progressive neurological deficit, and potential overflow incontinence  today, there is enough concern for possible cauda equina to obtain MRI imaging.  Patient seems very anxious and is having a headache, will provide Haldol for discomfort.     Patient required further pain and nausea and anxiety medication to tolerate MRI.  MRI reveals L5 nerve root compression due to herniated disc.  Findings discussed with Dr. Maisie Fus of neurosurgery, no emergent process, appropriate for steroids and close follow-up.  Appropriate for discharge.  Elmer Sow. Pilar Plate, MD [facility] Emergency Medicine [facility] [email or web address]  Final Clinical Impressions(s) / ED Diagnoses     ICD-10-CM   1. 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  Your exam/testing today was overall reassuring.  Symptoms seem to be due to a disc herniation with a pinched nerve.  Please take the steroids as directed and follow-up with your neurosurgeons.  Please return to the Emergency Department if you experience any worsening of your condition.  Thank you for allowing [place] to be a part of your care.        Sabas Sous, MD 07/31/20  [phone]

## 2020-07-31 NOTE — ED Notes (Addendum)
Received call from MRI advising that pt started vomiting and now states that she cannot continue exam. Dr. Pilar Plate notified via secure chat : Pt started vomiting in MRI with 8 mins left. She states she can't continue and requesting to come back.

## 2020-11-25 LAB — HM COLONOSCOPY

## 2021-01-24 DIAGNOSIS — U071 COVID-19: Secondary | ICD-10-CM | POA: Insufficient documentation

## 2021-02-10 DIAGNOSIS — N979 Female infertility, unspecified: Secondary | ICD-10-CM | POA: Insufficient documentation

## 2021-02-10 DIAGNOSIS — G8929 Other chronic pain: Secondary | ICD-10-CM | POA: Insufficient documentation

## 2021-02-10 DIAGNOSIS — G43009 Migraine without aura, not intractable, without status migrainosus: Secondary | ICD-10-CM | POA: Insufficient documentation

## 2021-05-03 ENCOUNTER — Emergency Department (HOSPITAL_COMMUNITY): Payer: BC Managed Care – PPO

## 2021-05-03 ENCOUNTER — Emergency Department (HOSPITAL_COMMUNITY)
Admission: EM | Admit: 2021-05-03 | Discharge: 2021-05-03 | Disposition: A | Discharge status: Home / Self Care | Payer: BC Managed Care – PPO | Attending: Emergency Medicine | Admitting: Emergency Medicine

## 2021-05-03 ENCOUNTER — Encounter (HOSPITAL_COMMUNITY): Payer: Self-pay | Admitting: *Deleted

## 2021-05-03 DIAGNOSIS — R1031 Right lower quadrant pain: Secondary | ICD-10-CM | POA: Diagnosis not present

## 2021-05-03 DIAGNOSIS — I10 Essential (primary) hypertension: Secondary | ICD-10-CM | POA: Insufficient documentation

## 2021-05-03 DIAGNOSIS — N83201 Unspecified ovarian cyst, right side: Secondary | ICD-10-CM

## 2021-05-03 DIAGNOSIS — Z79899 Other long term (current) drug therapy: Secondary | ICD-10-CM | POA: Insufficient documentation

## 2021-05-03 DIAGNOSIS — E119 Type 2 diabetes mellitus without complications: Secondary | ICD-10-CM | POA: Insufficient documentation

## 2021-05-03 DIAGNOSIS — R110 Nausea: Secondary | ICD-10-CM | POA: Diagnosis not present

## 2021-05-03 DIAGNOSIS — K219 Gastro-esophageal reflux disease without esophagitis: Secondary | ICD-10-CM | POA: Insufficient documentation

## 2021-05-03 DIAGNOSIS — E039 Hypothyroidism, unspecified: Secondary | ICD-10-CM | POA: Insufficient documentation

## 2021-05-03 DIAGNOSIS — Z7984 Long term (current) use of oral hypoglycemic drugs: Secondary | ICD-10-CM | POA: Diagnosis not present

## 2021-05-03 DIAGNOSIS — Z87891 Personal history of nicotine dependence: Secondary | ICD-10-CM | POA: Insufficient documentation

## 2021-05-03 DIAGNOSIS — R109 Unspecified abdominal pain: Secondary | ICD-10-CM

## 2021-05-03 DIAGNOSIS — R1011 Right upper quadrant pain: Secondary | ICD-10-CM | POA: Insufficient documentation

## 2021-05-03 LAB — CBC WITH DIFFERENTIAL/PLATELET
Abs Immature Granulocytes: 0.02 10*3/uL (ref 0.00–0.07)
Basophils Absolute: 0 10*3/uL (ref 0.0–0.1)
Basophils Relative: 1 %
Eosinophils Absolute: 0.2 10*3/uL (ref 0.0–0.5)
Eosinophils Relative: 2 %
HCT: 36.9 % (ref 36.0–46.0)
Hemoglobin: 11.9 g/dL — ABNORMAL LOW (ref 12.0–15.0)
Immature Granulocytes: 0 %
Lymphocytes Relative: 42 %
Lymphs Abs: 3.5 10*3/uL (ref 0.7–4.0)
MCH: 28.1 pg (ref 26.0–34.0)
MCHC: 32.2 g/dL (ref 30.0–36.0)
MCV: 87 fL (ref 80.0–100.0)
Monocytes Absolute: 0.4 10*3/uL (ref 0.1–1.0)
Monocytes Relative: 5 %
Neutro Abs: 4.3 10*3/uL (ref 1.7–7.7)
Neutrophils Relative %: 50 %
Platelets: 372 10*3/uL (ref 150–400)
RBC: 4.24 MIL/uL (ref 3.87–5.11)
RDW: 14.5 % (ref 11.5–15.5)
WBC: 8.3 10*3/uL (ref 4.0–10.5)
nRBC: 0 % (ref 0.0–0.2)

## 2021-05-03 LAB — URINALYSIS, ROUTINE W REFLEX MICROSCOPIC
Bilirubin Urine: NEGATIVE
Glucose, UA: NEGATIVE mg/dL
Hgb urine dipstick: NEGATIVE
Ketones, ur: NEGATIVE mg/dL
Leukocytes,Ua: NEGATIVE
Nitrite: NEGATIVE
Protein, ur: NEGATIVE mg/dL
Specific Gravity, Urine: 1.014 (ref 1.005–1.030)
pH: 6 (ref 5.0–8.0)

## 2021-05-03 LAB — COMPREHENSIVE METABOLIC PANEL
ALT: 13 U/L (ref 0–44)
AST: 13 U/L — ABNORMAL LOW (ref 15–41)
Albumin: 3.6 g/dL (ref 3.5–5.0)
Alkaline Phosphatase: 76 U/L (ref 38–126)
Anion gap: 7 (ref 5–15)
BUN: 8 mg/dL (ref 6–20)
CO2: 26 mmol/L (ref 22–32)
Calcium: 8.6 mg/dL — ABNORMAL LOW (ref 8.9–10.3)
Chloride: 102 mmol/L (ref 98–111)
Creatinine, Ser: 0.73 mg/dL (ref 0.44–1.00)
GFR, Estimated: >60 mL/min (ref >60)
Glucose, Bld: 107 mg/dL — ABNORMAL HIGH (ref 70–99)
Potassium: 3.8 mmol/L (ref 3.5–5.1)
Sodium: 135 mmol/L (ref 135–145)
Total Bilirubin: 0.4 mg/dL (ref 0.3–1.2)
Total Protein: 7 g/dL (ref 6.5–8.1)

## 2021-05-03 LAB — LIPASE, BLOOD: Lipase: 35 U/L (ref 11–51)

## 2021-05-03 LAB — POC URINE PREG, ED: Preg Test, Ur: NEGATIVE

## 2021-05-03 NOTE — ED Provider Notes (Signed)
Care of patient assumed from PA Caccavale at 1900.  Agree with history, physical exam and plan.  See their note for further details. Briefly, 35 year old female presenting with right-sided flank pain and abdominal pain.  Pain to right flank, right upper and lower quadrant over the last 2 days.  Endorses associated nausea and vomiting.  Physical Exam  BP 119/89 (BP Location: Right Arm)   Pulse (!) 105   Temp 98.8 F (37.1 C) (Oral)   Resp 17   SpO2 98%   Physical Exam Vitals and nursing note reviewed.  Constitutional:      General: She is not in acute distress.    Appearance: She is obese. She is not ill-appearing, toxic-appearing or diaphoretic.  HENT:     Head: Normocephalic.  Eyes:     General: No scleral icterus.       Right eye: No discharge.        Left eye: No discharge.  Cardiovascular:     Rate and Rhythm: Normal rate.  Pulmonary:     Effort: Pulmonary effort is normal.  Skin:    General: Skin is warm and dry.  Neurological:     General: No focal deficit present.     Mental Status: She is alert.     GCS: GCS eye subscore is 4. GCS verbal subscore is 5. GCS motor subscore is 6.  Psychiatric:        Behavior: Behavior is cooperative.    ED Course/Procedures     Procedures  MDM   At time of handoff CT renal study pending.  Lab work reassuring Urinalysis shows no signs of infection Ultrasound imaging of right upper quadrant shows no acute abnormality CT renal study shows no renal calculi or urinary tract obstructive changes.  4.2 cm right ovarian cyst which is enlarged from prior exam.  Discussed findings of CT renal study with patient.  Patient to follow-up with her OB/GYN provider for further evaluation of her right ovarian cyst.  Discussed symptomatic treatment with Tylenol and ibuprofen.  Patient to follow-up with PCP if symptoms do not improve.  Discussed results, findings, treatment and follow up. Patient advised of return precautions. Patient verbalized  understanding and agreed with plan.       Haskel Schroeder, PA-C 05/04/21 0119    Maia Plan, MD 05/04/21 1054

## 2021-05-03 NOTE — ED Provider Notes (Signed)
[facility] EMERGENCY DEPARTMENT Provider Note   CSN: [id number] Arrival date & time: 05/03/21  1623     History Chief Complaint  Patient presents with   Flank Pain    Sonya Gibson is a 35 y.o. female presenting for evaluation of right-sided flank and abdominal pain.  Patient states the past few days she has had pain in her right flank, right upper quadrant right lower quadrant abdomen.  She reports associated nausea, no vomiting.  No fevers, chills, chest pain, shortness of breath, cough.  No urinary symptoms or abnormal bowel movements.  No history of similar.  She does have a history of chronic back pain, states this feels different.  She is not taking anything for her symptoms.  She denies fall, trauma, or injury.  No symptoms on the left side.  Nothing makes her pain better or worse.  It is a constant dull pain with intermittent sharp pain.  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   DM (diabetes mellitus) (HCC) 07/08/2018   Headache 07/02/2018   Vaginal bleeding 07/02/2018   CSF leak 12/20/2017   Pressure injury of skin 12/20/2017   Lumbar radiculopathy    S/P lumbar laminectomy 12/05/2017   Perineal numbness 11/30/2017   Weakness of right lower extremity 11/17/2017   Lower back pain 11/15/2017   Hypothyroidism    Anxiety    Essential hypertension    PTSD (post-traumatic stress disorder)    Decreased sensation  of leg    GESTATIONAL DIABETES 04/26/2010    Past Surgical History:  Procedure Laterality Date   ANTERIOR CRUCIATE LIGAMENT REPAIR Left    BACK SURGERY  12/05/2017   CESAREAN SECTION     IR EPIDUROGRAPHY  11/17/2017   KNEE SURGERY     LUMBAR LAMINECTOMY/DECOMPRESSION MICRODISCECTOMY Right 12/05/2017   Procedure: Right L4-5 Microdiskectomy;  Surgeon: Tia Alert, MD;  Location: [facility] OR;  Service: Neurosurgery;  Laterality: Right;   LUMBAR LAMINECTOMY/DECOMPRESSION MICRODISCECTOMY N/A 12/20/2017   Procedure: Repair of CSF Leak;  Surgeon: Tia Alert, MD;  Location: [facility] OR;  Service: Neurosurgery;  Laterality: N/A;   LUMBAR WOUND DEBRIDEMENT N/A 12/27/2017   Procedure: Re-exploration for CSF leak repair and placement of lumbar drain;  Surgeon: Tia Alert, MD;  Location: [facility] OR;  Service: Neurosurgery;  Laterality: N/A;   middle finger reatachment     PLACEMENT OF LUMBAR DRAIN N/A 12/27/2017   Procedure: PLACEMENT OF LUMBAR DRAIN;  Surgeon: Tia Alert, MD;  Location: [facility] OR;  Service: Neurosurgery;  Laterality: N/A;   TONSILLECTOMY AND ADENOIDECTOMY  1996   VENTRICULOPERITONEAL SHUNT N/A 01/08/2018   Procedure: Re-exploration and repair of previous Lumbar cerebro-spinal fluid leak and placement of Lumbar drain;  Surgeon: Tia Alert, MD;  Location: [facility] OR;  Service: Neurosurgery;  Laterality: N/A;     OB History  Gravida  1   Para      Term      Preterm      AB      Living         SAB      IAB      Ectopic      Multiple      Live Births              Family History  Problem Relation Age of Onset   Diabetes Mellitus II Mother    Hypertension Mother    Seizures Mother    Other Mother        post stroke headaches   Stroke Mother    Hypertension Father    Diabetes Mellitus II Father    Hyperlipidemia Father    Bipolar disorder Sister    Asthma Son    Autism Son    Tourette syndrome Son    Autism Daughter        level 3   Other Daughter         tibia torsion & femoral aniversion    Social History   Tobacco Use   Smoking status: Former    Packs/day: 2.00    Years: 3.00    Pack years: 6.00    Types: Cigarettes    Quit date: 2006    Years since quitting: 16.8   Smokeless tobacco: Never  Vaping Use   Vaping Use: Never used  Substance Use Topics   Alcohol use: Yes    Comment: socially   Drug use: No    Home Medications Prior to Admission medications   Medication Sig Start Date End Date Taking? Authorizing Provider  acetaminophen (TYLENOL) 325 MG tablet Take 650 mg by mouth every 6 (six) hours as needed.    [provider]  albuterol (VENTOLIN HFA) 108 (90 Base) MCG/ACT inhaler Inhale 2 puffs into the lungs every 6 (six) hours as needed for wheezing or shortness of breath.    [provider]  Butalbital-APAP-Caffeine 50-300-40 MG CAPS Take 1 capsule by mouth every 4 (four) hours as needed (headache).  12/19/17   [provider]  calcium carbonate (TUMS EX) 750 MG chewable tablet Chew 2 tablets by mouth as needed for heartburn.    [provider]  cyanocobalamin (,VITAMIN B-12,) 1000 MCG/ML injection Inject 1,000 mcg into the muscle See admin instructions. Every two weeks 02/16/18   [provider]  dicyclomine (BENTYL) 20 MG tablet Take one po q8h prn abd cramps 02/21/20   Bethann Berkshire, MD  DULoxetine (CYMBALTA) 60 MG capsule Take 120 mg by mouth daily.    [provider]  gabapentin (NEURONTIN) 600 MG tablet Take 600 mg by mouth 3 (three) times daily.    [provider]  Melatonin 10 MG TABS Take 10 mg by mouth daily as needed (sleep).    [provider]  meloxicam (MOBIC) 15 MG tablet Take 1 tablet (15 mg total) by mouth daily. 05/28/20   Jeannie Fend, PA-C  metFORMIN (GLUCOPHAGE) 500 MG tablet Take 1,000 mg by mouth 2 (two) times daily with a meal.  09/25/17 09/20/18  [provider]  methocarbamol (ROBAXIN) 500 MG tablet Take 1 tablet (500 mg  total) by mouth 2 (two) times daily. 05/28/20   Jeannie Fend, PA-C  oxyCODONE-acetaminophen (PERCOCET/ROXICET) 5-325 MG tablet Take 1 tablet by mouth every 6 (six) hours as needed for moderate pain or severe pain. 06/04/20   Vanetta Mulders, MD  pantoprazole (PROTONIX) 20 MG tablet Take 1 tablet (20 mg total) by mouth daily. 02/21/20   Bethann Berkshire, MD  phenazopyridine (PYRIDIUM) 100 MG tablet Take 1 tablet (100 mg total) by mouth 3 (three) times daily as needed for pain. 10/31/19   Avegno, Zachery Dakins, FNP  predniSONE (DELTASONE) 10 MG tablet Take 4 tablets once a day for 3 days. Take 3 tablets once a day for the next 3 days. Take 2 tablets once a day for the next 3 days. Take 1 tablet once a day for the next 3 days. 07/31/20   Sabas Sous, MD  Prenatal Vit-Fe Fumarate-FA (PRENATAL MULTIVITAMIN) TABS tablet Take 2 tablets by mouth Nightly.    [provider]  promethazine (PHENERGAN) 25 MG tablet Take 1 tablet (25 mg total) by mouth every 6 (six) hours as needed for nausea or vomiting. 07/06/18   Anson Fret, MD  traZODone (DESYREL) 150 MG tablet Take 150 mg by mouth at bedtime as needed for sleep.    [provider]  Vitamin D, Ergocalciferol, (DRISDOL) 1.25 MG (50000 UT) CAPS capsule Take 50,000 Units by mouth every 7 (seven) days.    [provider]  levETIRAcetam (KEPPRA) 500 MG tablet Take 1 tablet (500 mg total) by mouth 2 (two) times daily. 07/10/18 07/12/19  Catarina Hartshorn, MD  metoprolol tartrate (LOPRESSOR) 25 MG tablet Take 0.5 tablets (12.5 mg total) by mouth 2 (two) times daily. 07/10/18 06/02/19  Catarina Hartshorn, MD  venlafaxine XR (EFFEXOR-XR) 75 MG 24 hr capsule Take 75 mg by mouth at bedtime.  12/12/17 06/02/19  [provider]    Allergies    Cinnamon, Coconut oil, Lovenox [enoxaparin], Nitrofurantoin, and Other  Review of Systems   Review of Systems  Gastrointestinal:  Positive for abdominal pain and nausea.  Genitourinary:  Positive for flank  pain.  All other systems reviewed and are negative.  Physical Exam Updated Vital Signs BP 119/89 (BP Location: Right Arm)   Pulse (!) 105   Temp 98.8 F (37.1 C) (Oral)   Resp 17   SpO2 98%   Physical Exam Vitals and nursing note reviewed.  Constitutional:      General: She is not in acute distress.    Appearance: Normal appearance. She is obese.     Comments: Nontoxic  HENT:     Head: Normocephalic and atraumatic.  Eyes:     Conjunctiva/sclera: Conjunctivae normal.     Pupils: Pupils are equal, round, and reactive to light.  Cardiovascular:     Rate and Rhythm: Normal rate and regular rhythm.     Pulses: Normal pulses.  Pulmonary:     Effort: Pulmonary effort is normal. No respiratory distress.     Breath sounds: Normal breath sounds. No wheezing.     Comments: Speaking in full sentences.  Clear lung sounds in all fields. Abdominal:     General: There is no distension.     Palpations: Abdomen is soft. There is no mass.     Tenderness: There is abdominal tenderness. There is right CVA tenderness. There is no guarding or rebound.     Comments: Mild tenderness palpation of right upper quadrant, right lower quadrant, and right flank.  Musculoskeletal:        General: Normal range of motion.     Cervical back: Normal range of motion and neck supple.  Skin:    General: Skin is warm and dry.     Capillary Refill: Capillary refill takes less than 2  seconds.  Neurological:     Mental Status: She is alert and oriented to person, place, and time.  Psychiatric:        Mood and Affect: Mood and affect normal.        Speech: Speech normal.        Behavior: Behavior normal.    ED Results / Procedures / Treatments   Labs (all labs ordered are listed, but only abnormal results are displayed) Labs Reviewed  URINALYSIS, ROUTINE W REFLEX MICROSCOPIC - Abnormal; Notable for the following components:      Result Value   APPearance HAZY (*)    All other components within normal  limits  CBC WITH DIFFERENTIAL/PLATELET - Abnormal; Notable for the following components:   Hemoglobin 11.9 (*)    All other components within normal limits  COMPREHENSIVE METABOLIC PANEL - Abnormal; Notable for the following components:   Glucose, Bld 107 (*)    Calcium 8.6 (*)    AST 13 (*)    All other components within normal limits  LIPASE, BLOOD  POC URINE PREG, ED    EKG None  Radiology US Abdomen Limited RUQ (LIVER/GB)  Result Date: 05/03/2021 CLINICAL DATA:  Right upper quadrant abdominal pain. EXAM: ULTRASOUND ABDOMEN LIMITED RIGHT UPPER QUADRANT COMPARISON:  None. FINDINGS: Gallbladder: No gallstones or wall thickening visualized. No sonographic Murphy sign noted by sonographer. Common bile duct: Diameter: 3 mm Liver: There is diffuse increased liver echogenicity most commonly seen in the setting of fatty infiltration. Superimposed inflammation or fibrosis is not excluded. Clinical correlation is recommended. Portal vein is patent on color Doppler imaging with normal direction of blood flow towards the liver. Other: None. IMPRESSION: Fatty liver, otherwise unremarkable right upper quadrant ultrasound. Electronically Signed   By: Elgie Collard M.D.   On: 05/03/2021 17:44    Procedures Procedures   Medications Ordered in ED Medications - No data to display  ED Course  I have reviewed the triage vital signs and the nursing notes.  Pertinent labs & imaging results that were available during my care of the patient were reviewed by me and considered in my medical decision making (see chart for details).    MDM Rules/Calculators/A&P                           Patient presenting for evaluation of flank and abdominal pain.  On exam, patient appears nontoxic.  Consider kidney stone versus gallbladder versus appendicitis versus UTI.  Will obtain labs, urine, CT renal, and ultrasound for further evaluation.  Labs overall reassuring.  Urine without signs of infection or blood.   Ultrasound negative for acute findings.  Pt signed out to [employer], PA-C for f/u on CT renal. If negative, likely can be d/c  Final Clinical Impression(s) / ED Diagnoses Final diagnoses:  Right sided abdominal pain    Rx / DC Orders ED Discharge Orders     None        Alveria Apley, PA-C 05/03/21 Abelino Derrick, MD 05/04/21 1053

## 2021-05-03 NOTE — ED Triage Notes (Signed)
Right flank pain.

## 2021-05-03 NOTE — ED Notes (Signed)
Patient transported to CT 

## 2021-05-03 NOTE — ED Notes (Signed)
Pt has returned from CT.  

## 2021-05-03 NOTE — ED Notes (Signed)
Pt A&OX4 ambulatory at d/c with independent steady gait, NAD. Pt verbalized understanding of d/c instructions and follow up care. ?

## 2021-05-03 NOTE — ED Notes (Signed)
Pt ambulatory to restroom with independent steady gait °

## 2021-05-03 NOTE — Discharge Instructions (Signed)
You came to the emergency department today to be evaluated for your right flank and abdominal pain.  Your physical exam and lab work were reassuring.  The CT scan on your abdomen pelvis showed no kidney stones.  It did show that you have a 4.2 cm right ovarian cyst.  This is slightly larger than previously imaged.  Please follow-up with your OB/GYN provider for further work-up of your cyst.  Please follow-up with your primary care provider if your pain does not improve.  Please take Ibuprofen (Advil, motrin) and Tylenol (acetaminophen) to relieve your pain.    You may take up to 600 MG (3 pills) of normal strength ibuprofen every 8 hours as needed.   You make take tylenol, up to 1,000 mg (two extra strength pills) every 8 hours as needed.   It is safe to take ibuprofen and tylenol at the same time as they work differently.   Do not take more than 3,000 mg tylenol in a 24 hour period (not more than one dose every 8 hours.  Please check all medication labels as many medications such as pain and cold medications may contain tylenol.  Do not drink alcohol while taking these medications.  Do not take other NSAID'S while taking ibuprofen (such as aleve or naproxen).  Please take ibuprofen with food to decrease stomach upset.  Get help right away if: Your pain does not go away as soon as your health care provider told you to expect. You cannot stop vomiting. Your pain is only in areas of the abdomen, such as the right side or the left lower portion of the abdomen. Pain on the right side could be caused by appendicitis. You have bloody or black stools, or stools that look like tar. You have severe pain, cramping, or bloating in your abdomen. You have signs of dehydration, such as: Dark urine, very little urine, or no urine. Cracked lips. Dry mouth. Sunken eyes. Sleepiness. Weakness. You have trouble breathing or chest pain.

## 2021-05-12 ENCOUNTER — Telehealth: Payer: Self-pay | Admitting: *Deleted

## 2021-05-12 ENCOUNTER — Telehealth (INDEPENDENT_AMBULATORY_CARE_PROVIDER_SITE_OTHER): Payer: BC Managed Care – PPO | Admitting: Neurology

## 2021-05-12 DIAGNOSIS — R569 Unspecified convulsions: Secondary | ICD-10-CM

## 2021-05-12 DIAGNOSIS — G43009 Migraine without aura, not intractable, without status migrainosus: Secondary | ICD-10-CM | POA: Insufficient documentation

## 2021-05-12 MED ORDER — AJOVY 225 MG/1.5ML ~~LOC~~ SOAJ: 225.0000 mg | SUBCUTANEOUS | 11 refills | Status: DC

## 2021-05-12 MED ORDER — RIZATRIPTAN BENZOATE 10 MG PO TBDP
10.0000 mg | ORAL_TABLET | ORAL | 11 refills | Status: AC | PRN
Start: 2021-05-12 — End: ?

## 2021-05-12 MED ORDER — UBRELVY 100 MG PO TABS: 100.0000 mg | ORAL_TABLET | ORAL | 11 refills | Status: DC | PRN

## 2021-05-12 MED ORDER — QULIPTA 60 MG PO TABS: 60.0000 mg | ORAL_TABLET | Freq: Every day | ORAL | 11 refills | Status: DC

## 2021-05-12 NOTE — Progress Notes (Signed)
[facility]    Provider:  Dr Lucia Gaskins Referring Provider: [name], NP, [name] NP Primary Care Physician:  [name], NP   Virtual Visit via Video Note  I connected with Sonya Gibson on 05/12/21 at  3:00 PM EST by a video enabled telemedicine application and verified that I am speaking with the correct person using two identifiers.  Location: Patient: home Provider: office   I discussed the limitations of evaluation and management by telemedicine and the availability of in person appointments. The patient expressed understanding and agreed to proceed.  Follow Up Instructions:    I discussed the assessment and treatment plan with the patient. The patient was provided an opportunity to ask questions and all were answered. The patient agreed with the plan and demonstrated an understanding of the instructions.   The patient was advised to call back or seek an in-person evaluation if the symptoms worsen or if the condition fails to improve as anticipated.  I provided over 40 minutes of non-face-to-face time during this encounter.   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 She was on fioricet. She was prescribed sumatriptan which is ok and maxalt. She has migraines, pressure, if she lays down they are better than if she sits down. She needs it cool, pulsating/pounding/throbbing and light and sound sensitivity, very nauseated. These are similar to migraines she has had in the past. She is having them around her periods it gets worse. She takes a lot of ibuprofen and goody powders, she works at an eye doctor and her exam is normal no swelling behind the eyes, she had 10 total headache + migraine days a month of which 6 are migrainous that can be moderate to severe. No other focal neurologic deficits, associated symptoms, inciting events or modifiable factors.   Medications tried: Tylenol, topamax, fioricet,  flexeril, benadryl, cymbalta, gaapentoin, ibuprofen, ketorolac, keppra, lisinopril, robaxin, medrol dosepak, metoprolol, reglan, amitriptyline/nortriptyline, zofran, prednisone, compazine, phenergan, scopolamine, imitrex, maxalt, tizanidine, trazodone, effexor,   Chart review:  Patient with multiple emergency room visits at least 7 or more just in the last year, I saw her initially in early 2020 and she was non compliant with recommendations and no-showed 3 appointments in our clinic in 2020, she no showed appointments in our clinic and never responded to the providers we gave referrals for including [facility] and ambulatory EEG; she was noncompliant with my recommendations when I last saw her in 2020 including ophthalmology evaluation, CT myelogram, lumbar puncture, ambulatory 3-day EEG and cardiac evaluations.  It also appears since I seen her she has been to pain management, neurology, neurosurgery, psychiatry, ophthalmology at outside facilities (see "care everywhere") and diagnosed with failed back surgery syndrome.  She regularly sees pain management and is managed there with medications, epidural steroid injections, and other management for pain control.  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   She had headaches since spinal surgery started after her first surgery, these were not like her migraines at all, MRI of the brain showed no etiology.  We referred her to [facility] for low pressure headaches does not appear as  though she went.  Patient complains of symptoms per HPI as well as the following symptoms: migraines . Pertinent negatives and positives per HPI. All others  negative   CC Interval history 07/13/2018: Patient is here for follow up. She was recently admitted for seizure-like activity. :  Headache, blurry vision, balance issues, muscle jerks, dizzy all after Lumbr Surgery s/p csf leak and multiple admissions for repair with extensive hospitalization. He was at home and had tonic-clonic activity, urination and post-ictal period x2. And then also while in the ED. 5-6 days prior she stopped her Klonopin and Gabapentin. Urine drug screen +benzos and barbituates even though her drug registry does not show her getting Benzos since 11/2017. She was started on Keppra. She has had 2 more.  No driving. She has had 2 others since leaving the hospital. Reviewed video which appears non-epileptic, reviewed it with colleague, eyes closed, variable shaking of all limbs not tonic or clonix. She needs to see pcp for cardiac workup.   HPI 07/06/2018:  Sonya Gibson is a 36 y.o. female here as requested by Dr. Coralee North for intractable headache. 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 PMHx PTSD, hypothyroidism, depression, anxiety, HTN, Diabetes, migraine, anxiety.  She has had headaches since spinal surgery (see above) started after her first surgery, these are not like her migraines at all.  She would get relief with laying down and worse standing, vision changes, all over the head, imbalance, dizziness and vertigo. It all started in the setting of csf leak and has contnued since then. She has no relief with migraine cocktail or even fioricet. The headache is continuous but some days worse than others. No migraine symptoms such as light or sound sensitivity, again not like previous headaches. The positional nature os becoming less noticeable but still better with laying down and worse with standing. Her headaches are in the back of the head worse but all over, feels like a pressure and a throb, she can hear whooshing  noise. Her left eye goes blurry. She has been seeing an ophthalmologist in October and she went to the ED. She has had multiple steroids infusions. She has a band of pressure. Better with laying down. No papilledema was noted in the eye but +RAPD however MRI Brain and c-spine did not show etiology.Prior to this headaches were not daily, maybe one day a month and rarely severe.   Reviewed notes, labs and imaging from outside physicians, which showed:  Reviewed inpatient records.  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  in July 2019 she was admitted repeatedly for pseudomeningocele and taken to the operating room multiple times for lumbar reexploration with repair of CSF leak and placement of lumbar drain.  She had a long and complicated hospital course.  The lumbar drain remained in place for 8 days and a CT myelogram suggested no evidence of leak.  The lumbar drain was removed.  She then once again started to leak spinal fluid from her wound.  She was taken back to the operating room for exploration and placement of a lumboperitoneal shunt which was not not placed but iIt was found that she had torn through her suture line, the dura was resutured, and a lumbar drain was placed.  On discharge Dr. Yetta Barre notes state she had no headache, she had appropriate soreness but no radicular pain.  She was  taken off vancomycin which was used prophylactically on the lumbar drain was in place.  Pain was well tolerated with oral pain medications.  Review of Systems: Patient complains of symptoms per HPI as well as the following symptoms: Headache, numbness, weakness, difficulty swallowing, dizziness, passing out, insomnia, sleepiness, snoring, depression, anxiety, too much sleep, not enough sleep, decreased energy, blurred vision, loss of vision, fatigue, ringing in ears, spinning sensation, trouble swallowing, diarrhea, joint pain,  aching muscles. Pertinent negatives and positives per HPI. All others negative.   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    Highest education level: Not on file  Occupational History   Not on file  Tobacco Use   Smoking status: Former    Packs/day: 2.00    Years: 3.00    Pack years: 6.00    Types: Cigarettes    Quit date: 2006    Years since quitting: 16.9   Smokeless tobacco: Never  Vaping Use   Vaping Use: Never used  Substance and Sexual Activity   Alcohol use: Yes    Comment: socially   Drug use: No   Sexual activity: Yes    Birth control/protection: None  Other Topics Concern   Not on file  Social History Narrative   Lives at home with husband and kids   Right handed   Caffeine: 6 cups daily   Social Determinants of Health   Financial Resource Strain: Not on file  Food Insecurity: Not on file  Transportation Needs: Not on file  Physical Activity: Not on file  Stress: Not on file  Social Connections: Not on file  Intimate Partner Violence: Not on file    Family History  Problem Relation Age of Onset   Diabetes Mellitus II Mother    Hypertension Mother    Seizures Mother    Other Mother        post stroke headaches   Stroke Mother    Hypertension Father    Diabetes Mellitus II Father    Hyperlipidemia Father    Bipolar disorder Sister    Asthma Son    Autism Son    Tourette syndrome Son    Autism Daughter        level 3   Other Daughter        tibia torsion & femoral aniversion    Past Medical History:  Diagnosis Date   Anxiety    Chronic back pain    Depression    Diabetes (HCC)    Drug-seeking behavior    Essential hypertension    GERD (gastroesophageal reflux disease)    Hypothyroidism    Kidney stone    Lumbar radiculopathy, right    Migraine headache    PTSD (post-traumatic stress disorder)     Past Surgical  History:  Procedure Laterality Date   ANTERIOR CRUCIATE LIGAMENT REPAIR Left    BACK SURGERY  12/05/2017   CESAREAN SECTION     IR EPIDUROGRAPHY  11/17/2017   KNEE SURGERY     LUMBAR LAMINECTOMY/DECOMPRESSION MICRODISCECTOMY Right 12/05/2017   Procedure: Right L4-5 Microdiskectomy;  Surgeon: Tia Alert, MD;  [facility] OR;  Service: Neurosurgery;  Laterality: Right;   LUMBAR LAMINECTOMY/DECOMPRESSION MICRODISCECTOMY N/A 12/20/2017   Procedure: Repair of CSF Leak;  Surgeon: Tia Alert, MD;  Location: [facility] OR;  Service: Neurosurgery;  Laterality: N/A;  LUMBAR WOUND DEBRIDEMENT N/A 12/27/2017   Procedure: Re-exploration for CSF leak repair and placement of lumbar drain;  Surgeon: Tia Alert, MD;  Location: [facility] OR;  Service: Neurosurgery;  Laterality: N/A;   middle finger reatachment     PLACEMENT OF LUMBAR DRAIN N/A 12/27/2017   Procedure: PLACEMENT OF LUMBAR DRAIN;  Surgeon: Tia Alert, MD;  Location: [facility] OR;  Service: Neurosurgery;  Laterality: N/A;   TONSILLECTOMY AND ADENOIDECTOMY  1996   VENTRICULOPERITONEAL SHUNT N/A 01/08/2018   Procedure: Re-exploration and repair of previous Lumbar cerebro-spinal fluid leak and placement of Lumbar drain;  Surgeon: Tia Alert, MD;  Location: [facility] OR;  Service: Neurosurgery;  Laterality: N/A;    Current Outpatient Medications  Medication Sig Dispense Refill   Atogepant (QULIPTA) 60 MG TABS Take 60 mg by mouth daily. 30 tablet 11   Fremanezumab-vfrm (AJOVY) 225 MG/1.5ML SOAJ Inject 225 mg into the skin every 30 (thirty) days. 1.5 mL 11   rizatriptan (MAXALT-MLT) 10 MG disintegrating tablet Take 1 tablet (10 mg total) by mouth as needed for migraine. May repeat in 2 hours if needed 9 tablet 11   Ubrogepant (UBRELVY) 100 MG TABS Take 100 mg by mouth every 2 (two) hours as needed. Maximum  a day. 16 tablet 11   acetaminophen (TYLENOL) 325 MG tablet Take 650 mg by mouth every 6 (six) hours as needed.     albuterol (VENTOLIN HFA) 108 (90  Base) MCG/ACT inhaler Inhale 2 puffs into the lungs every 6 (six) hours as needed for wheezing or shortness of breath.     Butalbital-APAP-Caffeine 50-300-40 MG CAPS Take 1 capsule by mouth every 4 (four) hours as needed (headache).   0   calcium carbonate (TUMS EX) 750 MG chewable tablet Chew 2 tablets by mouth as needed for heartburn.     cyanocobalamin (,VITAMIN B-12,) 1000 MCG/ML injection Inject 1,000 mcg into the muscle See admin instructions. Every two weeks  5   dicyclomine (BENTYL) 20 MG tablet Take one po q8h prn abd cramps 20 tablet 0   DULoxetine (CYMBALTA) 60 MG capsule Take 120 mg by mouth daily.     gabapentin (NEURONTIN) 600 MG tablet Take 600 mg by mouth 3 (three) times daily.     Melatonin 10 MG TABS Take 10 mg by mouth daily as needed (sleep).     meloxicam (MOBIC) 15 MG tablet Take 1 tablet (15 mg total) by mouth daily. 14 tablet 0   metFORMIN (GLUCOPHAGE) 500 MG tablet Take 1,000 mg by mouth 2 (two) times daily with a meal.      methocarbamol (ROBAXIN) 500 MG tablet Take 1 tablet (500 mg total) by mouth 2 (two) times daily. 20 tablet 0   oxyCODONE-acetaminophen (PERCOCET/ROXICET) 5-325 MG tablet Take 1 tablet by mouth every 6 (six) hours as needed for moderate pain or severe pain. 15 tablet 0   pantoprazole (PROTONIX) 20 MG tablet Take 1 tablet (20 mg total) by mouth daily. 30 tablet 0   phenazopyridine (PYRIDIUM) 100 MG tablet Take 1 tablet (100 mg total) by mouth 3 (three) times daily as needed for pain. 10 tablet 0   predniSONE (DELTASONE) 10 MG tablet Take 4 tablets once a day for 3 days. Take 3 tablets once a day for the next 3 days. Take 2 tablets once a day for the next 3 days. Take 1 tablet once a day for the next 3 days. 30 tablet 0   Prenatal Vit-Fe Fumarate-FA (PRENATAL MULTIVITAMIN) TABS tablet Take 2  tablets by mouth Nightly.     promethazine (PHENERGAN) 25 MG tablet Take 1 tablet (25 mg total) by mouth every 6 (six) hours as needed for nausea or vomiting. 60 tablet  2   traZODone (DESYREL) 150 MG tablet Take 150 mg by mouth at bedtime as needed for sleep.     Vitamin D, Ergocalciferol, (DRISDOL) 1.25 MG (50000 UT) CAPS capsule Take 50,000 Units by mouth every 7 (seven) days.     No current facility-administered medications for this visit.    Allergies as of 05/12/2021 - Review Complete 05/03/2021  Allergen Reaction Noted   Cinnamon Anaphylaxis 12/20/2017   Coconut oil Anaphylaxis 08/01/2016   Lovenox [enoxaparin] Other (See Comments) 12/20/2017   Nitrofurantoin Hives 04/26/2010   Other  03/26/2018    Vitals: There were no vitals taken for this visit. Last Weight:  Wt Readings from Last 1 Encounters:  07/30/20 (!) 346 lb 2 oz (157 kg)   Last Height:   Ht Readings from Last 1 Encounters:  07/30/20 5\' 1"  (1.549 m)   Physical exam: Exam: Gen: NAD, conversant      CV: attempted, Could not perform over Web Video. Denies palpitations or chest pain or SOB. VS: Breathing at a normal rate. Weight appears overweight. Not febrile. Eyes: Conjunctivae clear without exudates or hemorrhage  Neuro: Detailed Neurologic Exam  Speech:    Speech is normal; fluent and spontaneous with normal comprehension.  Cognition:    The patient is oriented to person, place, and time;     recent and remote memory intact;     language fluent;     normal attention, concentration,     fund of knowledge Cranial Nerves:    The pupils are equal, round, and reactive to light. Attempted, Cannot perform fundoscopic exam. Visual fields are full to finger confrontation. Extraocular movements are intact.  The face is symmetric with normal sensation. The palate elevates in the midline. Hearing intact. Voice is normal. Shoulder shrug is normal. The tongue has normal motion without fasciculations.   Coordination:    Normal finger to nose  Gait:    Normal native gait  Motor Observation:   no involuntary movements noted. Tone:    Appears normal  Posture:    Posture is  normal. normal erect    Strength:    Strength is anti-gravity and symmetric in the upper and lower limbs.      Sensation: intact to LT     Assessment/Plan:  35 year old patient with chronic migraines, failed back surgery(chronic pain), nonepileptic seizures. she had 10 total headache + migraine days a month of which 6 are migrainous that can be moderate to severe   - Start [place] daily. STOP if pregnant, discussed teratogenicity - Ubrelvy prn: STOP if pregnant, discussed teratogenicity  Medications tried: Tylenol, topamax, fioricet, flexeril, benadryl, cymbalta, gaapentoin, ibuprofen, ketorolac, keppra, lisinopril, robaxin, medrol dosepak, metoprolol, reglan, amitriptyline/nortriptyline, zofran, prednisone, compazine, phenergan, scopolamine, imitrex, maxalt, tizanidine, trazodone, effexor,    Prior assessment and plan 2020:  positional headahce that started after csf leak and extended hospitalization. She continues to have positional headaches, does not appear to sound like a primary headache disorder. Secondary to low-pressure headache? MRI doe snot show signs of low-pressure headache. Unclear etiology. May need low-csf expert 2021 at [facility]. She did not follow through  - No evidence on MRI of low-pressure headache however her symptoms appear to have started at that time and continue to be positional  - pseudoseizures: she did not follow  through with 3-day eeg. Non compliant.  - Aura. Discussed risk of stroke in patients with aura. No other causes on MRI brain. 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 No causes on MRI to show etiology.   - 3 day eeg ambulatory ordered  -New onset seizure-like activity, reviewed video and appears to be non-epileptic, eyes closed, variable shaking and hand movements. Still needs thorough eval, cardiac workup and likely therapy.Patient is unable to drive, operate heavy machinery, perform activities at heights or participate in water  activities until 6 months seizure free. Continue Keppra for now may help with headache.  - pcp for cardiac eval and ask about a 30-day holter monitor  - She was given ativan and klonopin at the hospital which is why +urine   To prevent or relieve headaches, try the following: Cool Compress. Lie down and place a cool compress on your head.  Avoid headache triggers. If certain foods or odors seem to have triggered your migraines in the past, avoid them. A headache diary might help you identify triggers.  Include physical activity in your daily routine. Try a daily walk or other moderate aerobic exercise.  Manage stress. Find healthy ways to cope with the stressors, such as delegating tasks on your to-do list.  Practice relaxation techniques. Try deep breathing, yoga, massage and visualization.  Eat regularly. Eating regularly scheduled meals and maintaining a healthy diet might help prevent headaches. Also, drink plenty of fluids.  Follow a regular sleep schedule. Sleep deprivation might contribute to headaches Consider biofeedback. With this mind-body technique, you learn to control certain bodily functions -- such as muscle tension, heart rate and blood pressure -- to prevent headaches or reduce headache pain.    Proceed to emergency room if you experience new or worsening symptoms or symptoms do not resolve, if you have new neurologic symptoms or if headache is severe, or for any concerning symptom.   Cc:Dr. Cyndy Freeze, MD  [facility] [address] [place], [place] [address]  Phone [phone] Fax [phone]

## 2021-05-12 NOTE — Telephone Encounter (Signed)
Per Dr. Lucia Gaskins cancelled the Ajovy at the [facility].  LMVM.

## 2021-05-19 ENCOUNTER — Encounter: Payer: Self-pay | Admitting: Neurology

## 2021-05-19 ENCOUNTER — Other Ambulatory Visit: Payer: Self-pay | Admitting: Neurology

## 2021-05-19 MED ORDER — ONDANSETRON 4 MG PO TBDP: 4.0000 mg | ORAL_TABLET | Freq: Three times a day (TID) | ORAL | 3 refills | Status: DC | PRN

## 2021-05-20 ENCOUNTER — Telehealth: Payer: Self-pay | Admitting: *Deleted

## 2021-05-20 NOTE — Telephone Encounter (Signed)
Ajovy PA, key [id number], [id number]. Your information has been sent to [facility]

## 2021-05-20 NOTE — Telephone Encounter (Signed)
Ajovy Approved, Coverage Starts on: 05/20/2021 12:00:00 AM, Coverage Ends on: 08/18/2021 12:00:00 AM. Faxed approval to pharmacy.

## 2021-05-20 NOTE — Telephone Encounter (Signed)
Rizatriptan PA, key [id number], [phone].Marland Kitchen Received message: Available without authorization. Faxed this info to pharmacy.

## 2021-05-31 ENCOUNTER — Telehealth: Payer: Self-pay | Admitting: *Deleted

## 2021-05-31 NOTE — Telephone Encounter (Signed)
Bernita Raisin PA, key: [id number]. Your information has been sent to [facility].

## 2021-06-01 ENCOUNTER — Encounter: Payer: Self-pay | Admitting: *Deleted

## 2021-06-01 NOTE — Telephone Encounter (Signed)
Bernita Raisin PA Case: [id number], Status: Denied by [employer]. Sent my chart and advised she download savings card.

## 2021-06-10 ENCOUNTER — Telehealth: Payer: Self-pay

## 2021-06-10 NOTE — Telephone Encounter (Signed)
(  Key: [id number])  [facility] has not replied to your PA request. Turnaround time for review of a PA request is dependent upon insurance plan and can range from 24 hours to 5 calendar days.  You may close this dialog, return to your dashboard, and perform other tasks. To check for an update later, go to the "Search" tab and click on "Search [employer] Detailed Status."  If this search option is not available or [facility] has not replied to your request within this timeframe, please contact the number on the back of the patient's insurance card.

## 2021-06-22 NOTE — Telephone Encounter (Signed)
We will call [facility] today to see if there is more info as to why this was denied. Perhaps need to change to regular tab vs ODT?

## 2021-06-22 NOTE — Telephone Encounter (Signed)
Spoke with [facility].  I was told the insurance will only cover 1.8 tablets/day.  I asked the pharmacy to run the prescription refill as 30 per 30 and it was able to process.  They are aware this is a 30-day supply only. They will fill for the patient.

## 2021-06-22 NOTE — Telephone Encounter (Signed)
Per [facility], PA Case: [id number], Status: Denied.  No reasons given on CMM.

## 2021-10-08 DIAGNOSIS — M1990 Unspecified osteoarthritis, unspecified site: Secondary | ICD-10-CM | POA: Insufficient documentation

## 2021-10-08 DIAGNOSIS — R930 Abnormal findings on diagnostic imaging of skull and head, not elsewhere classified: Secondary | ICD-10-CM | POA: Insufficient documentation

## 2021-10-08 DIAGNOSIS — R7689 Other specified abnormal immunological findings in serum: Secondary | ICD-10-CM | POA: Insufficient documentation

## 2021-10-27 ENCOUNTER — Telehealth: Payer: Self-pay | Admitting: *Deleted

## 2021-10-27 NOTE — Telephone Encounter (Signed)
Tobey Bride Key: [id number] - PA Case ID: [id number] ? ? ?PA Ubrevly waiting on approval  ?

## 2021-10-27 NOTE — Telephone Encounter (Signed)
LVM for patient to call me back to schedule a follow up appointment. . Will do PA for Ubrevly . Last appointment 04/2021  ?

## 2021-11-25 DIAGNOSIS — E782 Mixed hyperlipidemia: Secondary | ICD-10-CM | POA: Insufficient documentation

## 2021-11-25 DIAGNOSIS — Z789 Other specified health status: Secondary | ICD-10-CM | POA: Insufficient documentation

## 2021-12-23 ENCOUNTER — Telehealth: Payer: Self-pay

## 2021-12-23 NOTE — Telephone Encounter (Signed)
Approved from 12/23/21 to 12/23/22. Approval letter faxed to pharmacy.

## 2021-12-23 NOTE — Telephone Encounter (Signed)
PA for qulipta has been sent via cmm and received instant approval.  (Key: [id number])  This request has received a Favorable outcome.  Please note any additional information provided by [facility] at the bottom of this request.

## 2021-12-26 ENCOUNTER — Other Ambulatory Visit: Payer: Self-pay | Admitting: Neurology

## 2022-02-14 ENCOUNTER — Encounter: Payer: Self-pay | Admitting: Adult Health

## 2022-02-14 ENCOUNTER — Ambulatory Visit (INDEPENDENT_AMBULATORY_CARE_PROVIDER_SITE_OTHER): Payer: BC Managed Care – PPO | Admitting: Adult Health

## 2022-02-14 VITALS — BP 112/73 | HR 92 | Ht 63.0 in | Wt 268.0 lb

## 2022-02-14 DIAGNOSIS — F4310 Post-traumatic stress disorder, unspecified: Secondary | ICD-10-CM

## 2022-02-14 DIAGNOSIS — F411 Generalized anxiety disorder: Secondary | ICD-10-CM | POA: Diagnosis not present

## 2022-02-14 DIAGNOSIS — F410 Panic disorder [episodic paroxysmal anxiety] without agoraphobia: Secondary | ICD-10-CM

## 2022-02-14 DIAGNOSIS — G4700 Insomnia, unspecified: Secondary | ICD-10-CM

## 2022-02-14 DIAGNOSIS — F422 Mixed obsessional thoughts and acts: Secondary | ICD-10-CM

## 2022-02-14 DIAGNOSIS — F902 Attention-deficit hyperactivity disorder, combined type: Secondary | ICD-10-CM

## 2022-02-14 DIAGNOSIS — F3181 Bipolar II disorder: Secondary | ICD-10-CM | POA: Diagnosis not present

## 2022-02-14 MED ORDER — AMPHETAMINE-DEXTROAMPHET ER 30 MG PO CP24: 30.0000 mg | ORAL_CAPSULE | Freq: Every day | ORAL | 0 refills | Status: DC

## 2022-02-14 MED ORDER — TRAZODONE HCL 150 MG PO TABS: 150.0000 mg | ORAL_TABLET | Freq: Every evening | ORAL | 2 refills | Status: DC | PRN

## 2022-02-14 MED ORDER — LORAZEPAM 0.5 MG PO TABS: 0.5000 mg | ORAL_TABLET | Freq: Every day | ORAL | 0 refills | Status: DC | PRN

## 2022-02-14 MED ORDER — DULOXETINE HCL 60 MG PO CPEP: 120.0000 mg | ORAL_CAPSULE | Freq: Every day | ORAL | 2 refills | Status: DC
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
## 2022-02-15 ENCOUNTER — Other Ambulatory Visit: Payer: Self-pay

## 2022-02-15 ENCOUNTER — Telehealth: Payer: Self-pay | Admitting: Adult Health

## 2022-02-15 DIAGNOSIS — F902 Attention-deficit hyperactivity disorder, combined type: Secondary | ICD-10-CM

## 2022-02-15 MED ORDER — AMPHETAMINE-DEXTROAMPHET ER 30 MG PO CP24: 30.0000 mg | ORAL_CAPSULE | Freq: Every day | ORAL | 0 refills | Status: DC

## 2022-02-15 NOTE — Telephone Encounter (Signed)
Rx pended to [facility].

## 2022-02-15 NOTE — Telephone Encounter (Signed)
Sonya Gibson called this morning at 9:30 to report that she found that the [facility] on [address] in [place] has the Adderall.  Please send her prescription to them.

## 2022-02-15 NOTE — Telephone Encounter (Signed)
Canceled Rx at [facility].

## 2022-02-15 NOTE — Telephone Encounter (Signed)
Pt just got an rx for Adderall XR 30mg  but [facility] and [facility] in that area do not have it in stock. She is going to check on status at other pharmacies, [facility] and [facility] and report back. This is also new for her so it may need a PA started as well.

## 2022-02-22 ENCOUNTER — Encounter: Payer: Self-pay | Admitting: Neurology

## 2022-03-11 ENCOUNTER — Telehealth: Payer: Self-pay | Admitting: Adult Health

## 2022-03-11 NOTE — Telephone Encounter (Signed)
Patient has only been seen once, on Adderall 30 XR qd. She is asking for an increase and says it was discussed that an increase was possible. I would think you would want to wait until at least her next appt before discussing an increase. Please advise.

## 2022-03-11 NOTE — Telephone Encounter (Signed)
Will need to discuss at appt. Ok to refill current script.

## 2022-03-11 NOTE — Telephone Encounter (Signed)
Pt called at 10:35a.  She has appt on Monday but has the flu, so she rescheduled to Oct 2.  She requested refill of Adderall.  She is currently taking 30 mg.  She said she discussed increasing to 40 or 50 mg and she was going to ask Barnett Applebaum about that at her appt on Monday.  [facility] [place] [facility]  [facility]. She knows it can't be filled until 9/27.  [facility] [phone] - [place], [address]  [place], [place] [address]  Phone:  [phone]  Fax:  [phone]

## 2022-03-14 ENCOUNTER — Ambulatory Visit: Payer: BC Managed Care – PPO | Admitting: Adult Health

## 2022-03-15 ENCOUNTER — Other Ambulatory Visit: Payer: Self-pay

## 2022-03-15 DIAGNOSIS — F902 Attention-deficit hyperactivity disorder, combined type: Secondary | ICD-10-CM

## 2022-03-15 NOTE — Telephone Encounter (Signed)
She should have a 4 week appt.

## 2022-03-15 NOTE — Telephone Encounter (Signed)
Pended.

## 2022-03-21 ENCOUNTER — Telehealth (INDEPENDENT_AMBULATORY_CARE_PROVIDER_SITE_OTHER): Payer: BC Managed Care – PPO | Admitting: Neurology

## 2022-03-21 ENCOUNTER — Telehealth (HOSPITAL_COMMUNITY): Payer: Self-pay

## 2022-03-21 ENCOUNTER — Telehealth: Payer: Self-pay | Admitting: *Deleted

## 2022-03-21 ENCOUNTER — Telehealth (INDEPENDENT_AMBULATORY_CARE_PROVIDER_SITE_OTHER): Payer: BC Managed Care – PPO | Admitting: Adult Health

## 2022-03-21 ENCOUNTER — Encounter: Payer: Self-pay | Admitting: Adult Health

## 2022-03-21 ENCOUNTER — Other Ambulatory Visit: Payer: Self-pay | Admitting: *Deleted

## 2022-03-21 DIAGNOSIS — F410 Panic disorder [episodic paroxysmal anxiety] without agoraphobia: Secondary | ICD-10-CM

## 2022-03-21 DIAGNOSIS — F411 Generalized anxiety disorder: Secondary | ICD-10-CM | POA: Diagnosis not present

## 2022-03-21 DIAGNOSIS — F4310 Post-traumatic stress disorder, unspecified: Secondary | ICD-10-CM

## 2022-03-21 DIAGNOSIS — G4700 Insomnia, unspecified: Secondary | ICD-10-CM

## 2022-03-21 DIAGNOSIS — G43709 Chronic migraine without aura, not intractable, without status migrainosus: Secondary | ICD-10-CM | POA: Diagnosis not present

## 2022-03-21 DIAGNOSIS — F422 Mixed obsessional thoughts and acts: Secondary | ICD-10-CM

## 2022-03-21 DIAGNOSIS — F3181 Bipolar II disorder: Secondary | ICD-10-CM

## 2022-03-21 DIAGNOSIS — G43009 Migraine without aura, not intractable, without status migrainosus: Secondary | ICD-10-CM

## 2022-03-21 DIAGNOSIS — F902 Attention-deficit hyperactivity disorder, combined type: Secondary | ICD-10-CM

## 2022-03-21 MED ORDER — BOTOX 200 UNITS IJ SOLR: INTRAMUSCULAR | 1 refills | Status: DC

## 2022-03-21 MED ORDER — UBRELVY 100 MG PO TABS: 100.0000 mg | ORAL_TABLET | ORAL | 11 refills | Status: DC | PRN

## 2022-03-21 MED ORDER — LORAZEPAM 0.5 MG PO TABS: 0.5000 mg | ORAL_TABLET | Freq: Every day | ORAL | 0 refills | Status: DC | PRN

## 2022-03-21 MED ORDER — AMPHETAMINE-DEXTROAMPHET ER 20 MG PO CP24: 20.0000 mg | ORAL_CAPSULE | Freq: Two times a day (BID) | ORAL | 0 refills | Status: DC

## 2022-03-21 NOTE — Telephone Encounter (Signed)
Chronic Migraine CPT 64615  Botox [id number] Units:200  G43.709 Chronic Migraine without aura, not intractable, without status migrainous  New Botox start

## 2022-03-21 NOTE — Progress Notes (Signed)
[name] [id number] May 13, 1986 36 y.o.  Virtual Visit via Video Note  I connected with pt @ on 03/21/22 at  4:20 PM EDT by a video enabled telemedicine application and verified that I am speaking with the correct person using two identifiers.   I discussed the limitations of evaluation and management by telemedicine and the availability of in person appointments. The patient expressed understanding and agreed to proceed.  I discussed the assessment and treatment plan with the patient. The patient was provided an opportunity to ask questions and all were answered. The patient agreed with the plan and demonstrated an understanding of the instructions.   The patient was advised to call back or seek an in-person evaluation if the symptoms worsen or if the condition fails to improve as anticipated.  I provided 25 minutes of non-face-to-face time during this encounter.  The patient was located at home.  The provider was located at [place].   Aloha Gell, NP   Subjective:   Patient ID:  Sonya Gibson is a 36 y.o. (DOB Mar 01, 1986) female.  Chief Complaint: No chief complaint on file.   HPI Sonya Gibson presents for follow-up of PTSD, panic  attacks, obsessional thoughts and acts, depression, ADD - combined type and bipolar disorder - 2.  Describes mood today as "better". Pleasant. Decreased tearfulness. Mood symptoms - reports decreased depression and anxiety. Decreased panic attacks. Not feeling as angry and irritable - decreased outbursts. Reports decreased worry and rumination. Reports obsessive thoughts - "in my head" and acts. Reports checking and counting behaviors. Feels like she is more organized - "throwing things away". Moods are more consistent - 3 bad days over past month - otherwise stable. Not wanting to stay in the bed as much - not sleeping all day long - feels more productive. Able to get to work everyday - on time - late 3 to 4 times over the pst month  - "huge improvement". Improved interest and motivation. Taking medications as prescribed.  Energy levels improved. Active, does not have a regular exercise routine.  Appetite adequate. Weight loss - 5 pounds - 268 to 263 pounds. Sleeps well most nights. Averages 5 to 8 hours - less with recent flu in the family. Focus and concentration improved. Diagnosed with ADD in childhood - has taken Adderall XR, Ritalin and Vyvanse. Completing tasks. Managing aspects of household. Works full-time - Customer service manager - staff supportive.  Denies SI or HI.  Denies AH or VH.  Denies self harm -  over 5 years. Denies substance use. Uses Delta 8.  Previous medication trials:  Buspar, Hydroxyzine.   Review of Systems:  Review of Systems  Musculoskeletal:  Negative for gait problem.  Neurological:  Negative for tremors.  Psychiatric/Behavioral:         Please refer to HPI    Medications: I have reviewed the patient's current medications.  Current Outpatient Medications  Medication Sig Dispense Refill   acetaminophen (TYLENOL) 325 MG tablet Take 650 mg by mouth every 6 (six) hours as needed.     albuterol (VENTOLIN HFA) 108 (90 Base) MCG/ACT inhaler Inhale 2 puffs into the lungs every 6 (six) hours as needed for wheezing or shortness of breath.     amphetamine-dextroamphetamine (ADDERALL XR) 30 MG 24 hr capsule Take 1 capsule (30 mg total) by mouth daily. 30 capsule 0   botulinum toxin Type A (BOTOX) 200 units injection Provider to inject 155 units into the muscles of the head and  neck every 12 weeks. Discard remainder. 1 each 1   Butalbital-APAP-Caffeine 50-300-40 MG CAPS Take 1 capsule by mouth every 4 (four) hours as needed (headache).   0   calcium carbonate (TUMS EX) 750 MG chewable tablet Chew 2 tablets by mouth as needed for heartburn.     cyanocobalamin (,VITAMIN B-12,) 1000 MCG/ML injection Inject 1,000 mcg into the muscle See admin instructions. Every two weeks  5   dicyclomine (BENTYL) 20 MG  tablet Take one po q8h prn abd cramps 20 tablet 0   DULoxetine (CYMBALTA) 60 MG capsule Take 2 capsules (120 mg total) by mouth daily. 60 capsule 2   gabapentin (NEURONTIN) 600 MG tablet Take 600 mg by mouth 3 (three) times daily.     LORazepam (ATIVAN) 0.5 MG tablet Take 1 tablet (0.5 mg total) by mouth daily as needed for anxiety. 30 tablet 0   Melatonin 10 MG TABS Take 10 mg by mouth daily as needed (sleep).     meloxicam (MOBIC) 15 MG tablet Take 1 tablet (15 mg total) by mouth daily. 14 tablet 0   metFORMIN (GLUCOPHAGE) 500 MG tablet Take 1,000 mg by mouth 2 (two) times daily with a meal.      methocarbamol (ROBAXIN) 500 MG tablet Take 1 tablet (500 mg total) by mouth 2 (two) times daily. 20 tablet 0   ondansetron (ZOFRAN-ODT) 4 MG disintegrating tablet TAKE 1 TABLET BY MOUTH EVERY 8 HOURS AS NEEDED FOR NAUSEA 30 tablet 3   oxyCODONE-acetaminophen (PERCOCET/ROXICET) 5-325 MG tablet Take 1 tablet by mouth every 6 (six) hours as needed for moderate pain or severe pain. 15 tablet 0   pantoprazole (PROTONIX) 20 MG tablet Take 1 tablet (20 mg total) by mouth daily. 30 tablet 0   predniSONE (DELTASONE) 10 MG tablet Take 4 tablets once a day for 3 days. Take 3 tablets once a day for the next 3 days. Take 2 tablets once a day for the next 3 days. Take 1 tablet once a day for the next 3 days. 30 tablet 0   Prenatal Vit-Fe Fumarate-FA (PRENATAL MULTIVITAMIN) TABS tablet Take 2 tablets by mouth Nightly.     promethazine (PHENERGAN) 25 MG tablet Take 1 tablet (25 mg total) by mouth every 6 (six) hours as needed for nausea or vomiting. 60 tablet 2   rizatriptan (MAXALT-MLT) 10 MG disintegrating tablet Take 1 tablet (10 mg total) by mouth as needed for migraine. May repeat in 2 hours if needed 9 tablet 11   traZODone (DESYREL) 150 MG tablet Take 1 tablet (150 mg total) by mouth at bedtime as needed for sleep. 30 tablet 2   Ubrogepant (UBRELVY) 100 MG TABS Take 100 mg by mouth every 2 (two) hours as needed.  Maximum 200mg  a day. 16 tablet 11   Vitamin D, Ergocalciferol, (DRISDOL) 1.25 MG (50000 UT) CAPS capsule Take 50,000 Units by mouth every 7 (seven) days.     No current facility-administered medications for this visit.    Medication Side Effects: None  Allergies:  Allergies  Allergen Reactions   Cinnamon Anaphylaxis    (NO "REAL"/TREE BARK CINNAMON)   Clindamycin Nausea And Vomiting    Other reaction(s): gi distress Chest pain, trouble swallowing Chest pain, trouble swallowing    Coconut (Cocos Nucifera) Anaphylaxis    Can eat "fake" coconut   Lovenox [Enoxaparin] Other (See Comments)    BROKE DOWN THE SKIN AT INJECTION SITE AND CAUSED A WOUND   Nystatin Anaphylaxis   Aspartame  Other reaction(s): Other (See Comments) "Breathing Difficulty, Serious intense migraines".  "Breathing Difficulty, Serious intense migraines".     Niacin Hives   Nitrofurantoin Hives   Other     "Headache cocktail"   Blister to abd   Pregabalin     Other reaction(s): Other (See Comments) Depression with suicidal thoughts Depression with suicidal thoughts     Past Medical History:  Diagnosis Date   Anxiety    Chronic back pain    Depression    Diabetes (HCC)    Drug-seeking behavior    Essential hypertension    GERD (gastroesophageal reflux disease)    Hypothyroidism    Kidney stone    Lumbar radiculopathy, right    Migraine headache    PTSD (post-traumatic stress disorder)     Family History  Problem Relation Age of Onset   Diabetes Mellitus II Mother    Hypertension Mother    Seizures Mother    Other Mother        post stroke headaches   Stroke Mother    Hypertension Father    Diabetes Mellitus II Father    Hyperlipidemia Father    Bipolar disorder Sister    Asthma Son    Autism Son    Tourette syndrome Son    Autism Daughter        level 3   Other Daughter        tibia torsion & femoral aniversion    Social History   Socioeconomic History   Marital status:  Married    Spouse name: Not on file   Number of children: 3   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    Highest education level: Not on file  Occupational History   Not on file  Tobacco Use   Smoking status: Former    Packs/day: 2.00    Years: 3.00    Total pack years: 6.00    Types: Cigarettes    Quit date: 2006    Years since quitting: 17.7   Smokeless tobacco: Never  Vaping Use   Vaping Use: Never used  Substance and Sexual Activity   Alcohol use: Yes    Comment: socially   Drug use: No   Sexual activity: Yes    Birth control/protection: None  Other Topics Concern   Not on file  Social History Narrative   Lives at home with husband and kids   Right handed   Caffeine: 6 cups daily   Social Determinants of Health   Financial Resource Strain: Not on file  Food Insecurity: Not on file  Transportation Needs: Not on file  Physical Activity: Not on file  Stress: Not on file  Social Connections: Not on file  Intimate Partner Violence: Not on file    Past Medical History, Surgical history, Social history, and Family history were reviewed and updated as appropriate.   Please see review of systems for further details on the patient's review from today.   Objective:   Physical Exam:  There were no vitals taken for this visit.  Physical Exam Constitutional:      General: She is not in acute distress. Musculoskeletal:        General: No deformity.  Neurological:     Mental Status: She is alert and oriented to person, place, and time.     Coordination: Coordination normal.  Psychiatric:        Attention and Perception: Attention and perception normal. She does not perceive auditory or visual hallucinations.  Mood and Affect: Mood normal. Mood is not anxious or depressed. Affect is not labile, blunt, angry or inappropriate.        Speech: Speech normal.        Behavior: Behavior normal.        Thought Content: Thought content normal. Thought content is not paranoid  or delusional. Thought content does not include homicidal or suicidal ideation. Thought content does not include homicidal or suicidal plan.        Cognition and Memory: Cognition and memory normal.        Judgment: Judgment normal.     Comments: Insight intact     Lab Review:     Component Value Date/Time   NA 135 05/03/2021 1750   K 3.8 05/03/2021 1750   CL 102 05/03/2021 1750   CO2 26 05/03/2021 1750   GLUCOSE 107 (H) 05/03/2021 1750   BUN 8 05/03/2021 1750   CREATININE 0.73 05/03/2021 1750   CALCIUM 8.6 (L) 05/03/2021 1750   PROT 7.0 05/03/2021 1750   ALBUMIN 3.6 05/03/2021 1750   AST 13 (L) 05/03/2021 1750   ALT 13 05/03/2021 1750   ALKPHOS 76 05/03/2021 1750   BILITOT 0.4 05/03/2021 1750   GFRNONAA >60 05/03/2021 1750   GFRAA >60 02/21/2020 1033       Component Value Date/Time   WBC 8.3 05/03/2021 1750   RBC 4.24 05/03/2021 1750   HGB 11.9 (L) 05/03/2021 1750   HCT 36.9 05/03/2021 1750   PLT 372 05/03/2021 1750   MCV 87.0 05/03/2021 1750   MCH 28.1 05/03/2021 1750   MCHC 32.2 05/03/2021 1750   RDW 14.5 05/03/2021 1750   LYMPHSABS 3.5 05/03/2021 1750   MONOABS 0.4 05/03/2021 1750   EOSABS 0.2 05/03/2021 1750   BASOSABS 0.0 05/03/2021 1750    No results found for: "POCLITH", "LITHIUM"   No results found for: "PHENYTOIN", "PHENOBARB", "VALPROATE", "CBMZ"   .res Assessment: Plan:    Plan:  PDMP reviewed  D/C Adderall XR 30mg   Add Adderall XR 20mg  BID Cymbalta 60mg  - 2 daily Trazadone 150mg  at bedtime - takes 50mg  to 150 - typically 75mg  at hs Lorazepam 0.5mg  daily  110/78 - Monitor BP between visits while taking stimulant medication.   Time spent with patient was 25 minutes. Greater than 50% of face to face time with patient was spent on counseling and coordination of care.    RTC 4 weeks  Patient advised to contact office with any questions, adverse effects, or acute worsening in signs and symptoms.  Discussed potential benefits, risk, and side  effects of benzodiazepines to include potential risk of tolerance and dependence, as well as possible drowsiness.  Advised patient not to drive if experiencing drowsiness and to take lowest possible effective dose to minimize risk of dependence and tolerance.   Discussed potential metabolic side effects associated with atypical antipsychotics, as well as potential risk for movement side effects. Advised pt to contact office if movement side effects occur.    There are no diagnoses linked to this encounter.   Please see After Visit Summary for patient specific instructions.  Future Appointments  Date Time Provider Department Center  03/21/2022  4:20 PM Reese Senk, , NP [facility] None    No orders of the defined types were placed in this encounter.     -------------------------------

## 2022-03-21 NOTE — Telephone Encounter (Signed)
-----   Message from Melvenia Beam, MD sent at 03/21/2022  1:55 PM EDT ----- Regarding: Botox start Please start approval process for botox please she can be scheduled with an NP, let me know if she does not get approved thanks

## 2022-03-21 NOTE — Progress Notes (Addendum)
[facility]    Provider:  Dr Lucia Gaskins Referring Provider: [name], NP, [name] NP Primary Care Physician:  [name], NP  Addendum: doing better on botox. 6 migraine days a month < 10 total headache days failed imirex and maxalt will try and get nurtec approved prn  Virtual Visit via Video Note  I connected with Sonya Gibson on 03/21/22 at  1:30 PM EDT by a video enabled telemedicine application and verified that I am speaking with the correct person using two identifiers.  Location: Patient: home Provider: office   I discussed the limitations of evaluation and management by telemedicine and the availability of in person appointments. The patient expressed understanding and agreed to proceed.  Follow Up Instructions:    I discussed the assessment and treatment plan with the patient. The patient was provided an opportunity to ask questions and all were answered. The patient agreed with the plan and demonstrated an understanding of the instructions.   The patient was advised to call back or seek an in-person evaluation if the symptoms worsen or if the condition fails to improve as anticipated.  I provided over 25 minutes of non-face-to-face time during this encounter.   Sonya Fret, MD  03/21/2022: We started Sonya Gibson and it was working but the last few months migraines are worsening. She is not on birth control. Really bad around her monthly cycle. 10-12 migraine days a month, even wore than when we started the qulipta. We discussed botox today. At least 20 headache days a month. Ubelvy not helping as much anymore.   Medications tried: Tylenol, topamax, fioricet, flexeril, benadryl, cymbalta, gaapentoin, ibuprofen, ketorolac, keppra, lisinopril, robaxin, medrol dosepak, metoprolol, reglan, amitriptyline/nortriptyline, zofran, prednisone, compazine, phenergan, scopolamine, imitrex, maxalt, tizanidine, trazodone, effexor, Ajovy(also she was trying  to get pregnant, proably not anymore but not on birth control), Sonya Gibson  May 12, 2021:   She says she is here for migraines. She was on fioricet. She was prescribed sumatriptan which is ok and maxalt. She has migraines, pressure, if she lays down they are better than if she sits down. She needs it cool, pulsating/pounding/throbbing and light and sound sensitivity, very nauseated. These are similar to migraines she has had in the past. She is having them around her periods it gets worse. She takes a lot of ibuprofen and goody powders, she works at an eye doctor and her exam is normal no swelling behind the eyes, she had 10 total headache + migraine days a month of which 6 are migrainous that can be moderate to severe. No other focal neurologic deficits, associated symptoms, inciting events or modifiable factors.   Medications tried: Tylenol, topamax, fioricet, flexeril, benadryl, cymbalta, gaapentoin, ibuprofen, ketorolac, keppra, lisinopril, robaxin, medrol dosepak, metoprolol, reglan, amitriptyline/nortriptyline, zofran, prednisone, compazine, phenergan, scopolamine, imitrex, maxalt, tizanidine, trazodone, effexor, Ajovy(also she is trying to get pregnant), Sonya Gibson  Chart review:  Patient with multiple emergency room visits at least 7 or more just in the last year, I saw her initially in early 2020 and she was non compliant with recommendations and no-showed 3 appointments in our clinic in 2020, she no showed appointments in our clinic and never responded to the providers we gave referrals for including [facility] and ambulatory EEG; she was noncompliant with my recommendations when I last saw her in 2020 including ophthalmology evaluation, CT myelogram, lumbar puncture, ambulatory 3-day EEG and cardiac evaluations.  It also appears since I seen her she has been to pain management,  neurology, neurosurgery, psychiatry, ophthalmology at outside facilities (see "care everywhere") and  diagnosed with failed back surgery syndrome.  She regularly sees pain management and is managed there with medications, epidural steroid injections, and other management for pain control.  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   She had headaches since spinal surgery started after her first surgery, these were not like her migraines at all, MRI of the brain showed no etiology.  We referred her to [facility] for low pressure headaches does not appear as though she went.  Patient complains of symptoms per HPI as well as the following symptoms: migraines . Pertinent negatives and positives per HPI. All others negative   CC Interval history 07/13/2018: Patient is here for follow up. She was recently admitted for seizure-like activity. :  Headache, blurry vision, balance issues, muscle jerks, dizzy all after Lumbr Surgery s/p csf leak and multiple admissions for repair with extensive hospitalization. He was at home and had tonic-clonic activity, urination and post-ictal period x2. And then also while in the ED. 5-6 days prior she stopped her Klonopin and Gabapentin. Urine drug screen +benzos and barbituates even though her drug registry does not show her getting Benzos since 11/2017. She was started on Keppra. She has had 2 more.  No driving. She has had 2 others since leaving the hospital. Reviewed video which appears non-epileptic, reviewed it with colleague, eyes closed, variable shaking of all limbs not tonic or clonix. She needs to see pcp for cardiac workup.   HPI 07/06/2018:  Sonya Gibson is a 36 y.o. female here as requested by Dr. Coralee North for intractable headache. She is s/p lumbar  spine surgery with multiple admissions for pseudomeningocele and persistent spinal headaches. PMHx PTSD, hypothyroidism, depression, anxiety, HTN, Diabetes, migraine, anxiety.  She has had headaches since spinal surgery (see above) started after her first surgery, these are not like her migraines at all.  She would get relief with laying down and worse standing, vision changes, all over the head, imbalance, dizziness and vertigo. It all started in the setting of csf leak and has contnued since then. She has no relief with migraine cocktail or even fioricet. The headache is continuous but some days worse than others. No migraine symptoms such as light or sound sensitivity, again not like previous headaches. The positional nature os becoming less noticeable but still better with laying down and worse with standing. Her headaches are in the back of the head worse but all over, feels like a pressure and a throb, she can hear whooshing noise. Her left eye goes blurry. She has been seeing an ophthalmologist in October and she went to the ED. She has had multiple steroids infusions. She has a band of pressure. Better with laying down. No papilledema was noted in the eye but +RAPD however MRI Brain and c-spine did not show etiology.Prior to this headaches were not daily, maybe one day a month and rarely severe.   Reviewed notes, labs and imaging from outside physicians, which showed:  Reviewed inpatient records.  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  in July  2019 she was admitted repeatedly for pseudomeningocele and taken to the operating room multiple times for lumbar reexploration with repair of CSF leak and placement of lumbar drain.  She had a long and complicated hospital course.  The lumbar drain remained in place for 8 days and a CT myelogram suggested no evidence of leak.  The lumbar drain was removed.  She then once  again started to leak spinal fluid from her wound.  She was taken back to the operating room for exploration and placement of a lumboperitoneal shunt which was not not placed but iIt was found that she had torn through her suture line, the dura was resutured, and a lumbar drain was placed.  On discharge Dr. Yetta Barre notes state she had no headache, she had appropriate soreness but no radicular pain.  She was taken off vancomycin which was used prophylactically on the lumbar drain was in place.  Pain was well tolerated with oral pain medications.  Review of Systems: Patient complains of symptoms per HPI as well as the following symptoms: Headache, numbness, weakness, difficulty swallowing, dizziness, passing out, insomnia, sleepiness, snoring, depression, anxiety, too much sleep, not enough sleep, decreased energy, blurred vision, loss of vision, fatigue, ringing in ears, spinning sensation, trouble swallowing, diarrhea, joint pain, aching muscles. Pertinent negatives and positives per HPI. All others negative.   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    Highest education level: Not on file  Occupational History   Not on file  Tobacco Use   Smoking status: Former    Packs/day: 2.00    Years: 3.00    Total pack years: 6.00    Types: Cigarettes    Quit date: 2006    Years since quitting: 17.7   Smokeless tobacco: Never  Vaping Use   Vaping Use: Never used  Substance and Sexual Activity   Alcohol use: Yes    Comment: socially   Drug use: No   Sexual activity: Yes    Birth control/protection: None  Other Topics Concern   Not on file  Social History Narrative   Lives at home with husband and kids   Right handed   Caffeine: 6 cups daily   Social Determinants of Health   Financial Resource Strain: Not on file  Food Insecurity: Not on file  Transportation Needs: Not on file   Physical Activity: Not on file  Stress: Not on file  Social Connections: Not on file  Intimate Partner Violence: Not on file    Family History  Problem Relation Age of Onset   Diabetes Mellitus II Mother    Hypertension Mother    Seizures Mother    Other Mother        post stroke headaches   Stroke Mother    Hypertension Father    Diabetes Mellitus II Father    Hyperlipidemia Father    Bipolar disorder Sister    Asthma Son    Autism Son    Tourette syndrome Son    Autism Daughter        level 3   Other Daughter        tibia torsion & femoral aniversion    Past Medical History:  Diagnosis Date   Anxiety    Chronic back pain    Depression    Diabetes (HCC)    Drug-seeking behavior  Essential hypertension    GERD (gastroesophageal reflux disease)    Hypothyroidism    Kidney stone    Lumbar radiculopathy, right    Migraine headache    PTSD (post-traumatic stress disorder)     Past Surgical History:  Procedure Laterality Date   ANTERIOR CRUCIATE LIGAMENT REPAIR Left    BACK SURGERY  12/05/2017   CESAREAN SECTION     IR EPIDUROGRAPHY  11/17/2017   KNEE SURGERY     LUMBAR LAMINECTOMY/DECOMPRESSION MICRODISCECTOMY Right 12/05/2017   Procedure: Right L4-5 Microdiskectomy;  Surgeon: Tia Alert, MD;  Location: [facility] OR;  Service: Neurosurgery;  Laterality: Right;   LUMBAR LAMINECTOMY/DECOMPRESSION MICRODISCECTOMY N/A 12/20/2017   Procedure: Repair of CSF Leak;  Surgeon: Tia Alert, MD;  Location: [facility] OR;  Service: Neurosurgery;  Laterality: N/A;   LUMBAR WOUND DEBRIDEMENT N/A 12/27/2017   Procedure: Re-exploration for CSF leak repair and placement of lumbar drain;  Surgeon: Tia Alert, MD;  Location: [facility] OR;  Service: Neurosurgery;  Laterality: N/A;   middle finger reatachment     PLACEMENT OF LUMBAR DRAIN N/A 12/27/2017   Procedure: PLACEMENT OF LUMBAR DRAIN;  Surgeon: Tia Alert, MD;  Location: [facility] OR;  Service: Neurosurgery;  Laterality: N/A;   TONSILLECTOMY  AND ADENOIDECTOMY  1996   VENTRICULOPERITONEAL SHUNT N/A 01/08/2018   Procedure: Re-exploration and repair of previous Lumbar cerebro-spinal fluid leak and placement of Lumbar drain;  Surgeon: Tia Alert, MD;  Location: [facility] OR;  Service: Neurosurgery;  Laterality: N/A;    Current Outpatient Medications  Medication Sig Dispense Refill   acetaminophen (TYLENOL) 325 MG tablet Take 650 mg by mouth every 6 (six) hours as needed.     albuterol (VENTOLIN HFA) 108 (90 Base) MCG/ACT inhaler Inhale 2 puffs into the lungs every 6 (six) hours as needed for wheezing or shortness of breath.     amphetamine-dextroamphetamine (ADDERALL XR) 30 MG 24 hr capsule Take 1 capsule (30 mg total) by mouth daily. 30 capsule 0   Butalbital-APAP-Caffeine 50-300-40 MG CAPS Take 1 capsule by mouth every 4 (four) hours as needed (headache).   0   calcium carbonate (TUMS EX) 750 MG chewable tablet Chew 2 tablets by mouth as needed for heartburn.     cyanocobalamin (,VITAMIN B-12,) 1000 MCG/ML injection Inject 1,000 mcg into the muscle See admin instructions. Every two weeks  5   dicyclomine (BENTYL) 20 MG tablet Take one po q8h prn abd cramps 20 tablet 0   DULoxetine (CYMBALTA) 60 MG capsule Take 2 capsules (120 mg total) by mouth daily. 60 capsule 2   gabapentin (NEURONTIN) 600 MG tablet Take 600 mg by mouth 3 (three) times daily.     LORazepam (ATIVAN) 0.5 MG tablet Take 1 tablet (0.5 mg total) by mouth daily as needed for anxiety. 30 tablet 0   Melatonin 10 MG TABS Take 10 mg by mouth daily as needed (sleep).     meloxicam (MOBIC) 15 MG tablet Take 1 tablet (15 mg total) by mouth daily. 14 tablet 0   metFORMIN (GLUCOPHAGE) 500 MG tablet Take 1,000 mg by mouth 2 (two) times daily with a meal.      methocarbamol (ROBAXIN) 500 MG tablet Take 1 tablet (500 mg total) by mouth 2 (two) times daily. 20 tablet 0   ondansetron (ZOFRAN-ODT) 4 MG disintegrating tablet TAKE 1 TABLET BY MOUTH EVERY 8 HOURS AS NEEDED FOR NAUSEA 30  tablet 3   oxyCODONE-acetaminophen (PERCOCET/ROXICET) 5-325 MG tablet Take 1 tablet by mouth every  6 (six) hours as needed for moderate pain or severe pain. 15 tablet 0   pantoprazole (PROTONIX) 20 MG tablet Take 1 tablet (20 mg total) by mouth daily. 30 tablet 0   predniSONE (DELTASONE) 10 MG tablet Take 4 tablets once a day for 3 days. Take 3 tablets once a day for the next 3 days. Take 2 tablets once a day for the next 3 days. Take 1 tablet once a day for the next 3 days. 30 tablet 0   Prenatal Vit-Fe Fumarate-FA (PRENATAL MULTIVITAMIN) TABS tablet Take 2 tablets by mouth Nightly.     promethazine (PHENERGAN) 25 MG tablet Take 1 tablet (25 mg total) by mouth every 6 (six) hours as needed for nausea or vomiting. 60 tablet 2   rizatriptan (MAXALT-MLT) 10 MG disintegrating tablet Take 1 tablet (10 mg total) by mouth as needed for migraine. May repeat in 2 hours if needed 9 tablet 11   traZODone (DESYREL) 150 MG tablet Take 1 tablet (150 mg total) by mouth at bedtime as needed for sleep. 30 tablet 2   Ubrogepant (UBRELVY) 100 MG TABS Take 100 mg by mouth every 2 (two) hours as needed. Maximum 200mg  a day. 16 tablet 11   Vitamin D, Ergocalciferol, (DRISDOL) 1.25 MG (50000 UT) CAPS capsule Take 50,000 Units by mouth every 7 (seven) days.     No current facility-administered medications for this visit.    Allergies as of 03/21/2022 - Review Complete 02/14/2022  Allergen Reaction Noted   Cinnamon Anaphylaxis 12/20/2017   Clindamycin Nausea And Vomiting 10/11/2019   Coconut (cocos nucifera) Anaphylaxis 08/01/2016   Lovenox [enoxaparin] Other (See Comments) 12/20/2017   Nystatin Anaphylaxis 01/23/2019   Aspartame  06/09/2020   Niacin Hives 02/12/2021   Nitrofurantoin Hives 04/26/2010   Other  03/26/2018   Pregabalin  06/06/2020    Vitals: There were no vitals taken for this visit. Last Weight:  Wt Readings from Last 1 Encounters:  07/30/20 (!) 346 lb 2 oz (157 kg)   Last Height:   Ht  Readings from Last 1 Encounters:  07/30/20 5\' 1"  (1.549 m)    Physical exam: Exam: Gen: NAD, conversant      CV: Denies palpitations or chest pain or SOB. VS: Breathing at a normal rate.. Not febrile. Eyes: Conjunctivae clear without exudates or hemorrhage  Neuro: Detailed Neurologic Exam  Speech:    Speech is normal; fluent and spontaneous with normal comprehension.  Cognition:    The patient is oriented to person, place, and time;     recent and remote memory intact;     language fluent;     normal attention, concentration,     fund of knowledge Cranial Nerves:    The pupils are equal, round, and reactive to light.  Visual fields are full to finger confrontation. Extraocular movements are intact.  The face is symmetric with normal sensation. The palate elevates in the midline. Hearing intact. Voice is normal. Shoulder shrug is normal. The tongue has normal motion without fasciculations.   Coordination:    Normal finger to nose  Gait:    Normal native gait  Motor Observation:   no involuntary movements noted. Tone:    Appears normal  Posture:    Posture is normal. normal erect    Strength:    Strength is anti-gravity and symmetric in the upper and lower limbs.              Assessment/Plan:  36 year old patient with chronic migraines,  failed back surgery(chronic pain), nonepileptic seizures. We started Sonya Gibson and it was working but the last 3 months migraines are worse. She is not on birth control so we discussed teratogenicity. Really bad around her monthly cycle. 10-12 mod to severe migraine days a month and 20 headache days a month. We discussed botox today. At least 20 headache days a month. Ubelvy not helping as much anymore. Denies any recent aura with migraines. No medication overuse.   Addendum: doing better on botox. 6 migraine days a month < 10 total headache days failed imirex and maxalt will try and get nurtec approved prn  Medications tried: Tylenol,  topamax, fioricet, flexeril, benadryl, cymbalta, gaapentoin, ibuprofen, ketorolac, keppra, lisinopril, robaxin, medrol dosepak, metoprolol, reglan, amitriptyline/nortriptyline, zofran, prednisone, compazine, phenergan, scopolamine, imitrex, maxalt, tizanidine, trazodone, effexor, Ajovy(also she is trying to get pregnant), Sonya Gibson   - Stop Bennie Pierini for prevention and start Botox approval.  Bernita Raisin prn: stop if pregnant - she does not use birth control, discussed teratogenicity of botox vs ajovy, would not recommend Ajovy if pregnant, could also consider nurtec prevention although with her severity she may not get approved although data is limited for nurtec with pregnancy. At this point she does not think she can get pregnant, they have tried for a long time and she has declined ivf but she is still not on birth control. If she decides to start we can try ajovy again or try emgality or aimovig.  Medications tried: Tylenol, topamax, fioricet, flexeril, benadryl, cymbalta, gaapentoin, ibuprofen, ketorolac, keppra, lisinopril, robaxin, medrol dosepak, metoprolol, reglan, amitriptyline/nortriptyline, zofran, prednisone, compazine, phenergan, scopolamine, imitrex, maxalt, tizanidine, trazodone, effexor, ajovy, qulipta,ubrelvy   Prior assessment and plan 2020:  positional headahce that started after csf leak and extended hospitalization. She continues to have positional headaches, does not appear to sound like a primary headache disorder. Secondary to low-pressure headache? MRI doe snot show signs of low-pressure headache. Unclear etiology. May need low-csf expert [facility]. She did not follow through  - No evidence on MRI of low-pressure headache however her symptoms appear to have started at that time and continue to be positional  - pseudoseizures: she did not follow through with 3-day eeg. Non compliant.  - Aura. Discussed risk of stroke in patients with aura. No other causes on MRI  brain. 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 No causes on MRI to show etiology.   -New onset seizure-like activity, reviewed video and appears to be non-epileptic, eyes closed, variable shaking and hand movements. Still needs thorough eval, cardiac workup and likely therapy.Patient is unable to drive, operate heavy machinery, perform activities at heights or participate in water activities until 6 months seizure free. Continue Keppra for now may help with headache.  - pcp for cardiac eval and ask about a 30-day holter monitor  - She was given ativan and klonopin at the hospital which is why +urine   To prevent or relieve headaches, try the following: Cool Compress. Lie down and place a cool compress on your head.  Avoid headache triggers. If certain foods or odors seem to have triggered your migraines in the past, avoid them. A headache diary might help you identify triggers.  Include physical activity in your daily routine. Try a daily walk or other moderate aerobic exercise.  Manage stress. Find healthy ways to cope with the stressors, such as delegating tasks on your to-do list.  Practice relaxation techniques. Try deep breathing, yoga, massage and visualization.  Eat regularly.  Eating regularly scheduled meals and maintaining a healthy diet might help prevent headaches. Also, drink plenty of fluids.  Follow a regular sleep schedule. Sleep deprivation might contribute to headaches Consider biofeedback. With this mind-body technique, you learn to control certain bodily functions -- such as muscle tension, heart rate and blood pressure -- to prevent headaches or reduce headache pain.    Proceed to emergency room if you experience new or worsening symptoms or symptoms do not resolve, if you have new neurologic symptoms or if headache is severe, or for any concerning symptom.   Cc:Dr. Cyndy FreezeGill Darianny Momon, MD  [facility] [address] [place], [place] [id number]  Phone [phone] Fax [phone]

## 2022-03-21 NOTE — Progress Notes (Signed)
.  botox

## 2022-03-21 NOTE — Telephone Encounter (Signed)
Botox[employer] verification submitted.   Key #  [id number]

## 2022-03-22 ENCOUNTER — Other Ambulatory Visit (HOSPITAL_COMMUNITY): Payer: Self-pay

## 2022-03-22 ENCOUNTER — Other Ambulatory Visit: Payer: Self-pay | Admitting: *Deleted

## 2022-03-22 ENCOUNTER — Telehealth: Payer: Self-pay | Admitting: Pharmacy Technician

## 2022-03-22 MED ORDER — BOTOX 200 UNITS IJ SOLR
INTRAMUSCULAR | 1 refills | Status: DC
  Filled 2022-03-22: qty 1, 180d supply, fill #0
  Filled 2022-03-23: qty 1, 84d supply, fill #0
  Filled 2022-07-11: qty 1, 84d supply, fill #1

## 2022-03-22 NOTE — Telephone Encounter (Signed)
Patient Advocate Encounter  Prior Authorization for Botox 200UNIT solution has been approved.    PA# [id number] Key: [id number] Effective dates: 03/22/2022 through 09/18/2022  Can be filled at [place], [place] Patient [place] Patient Advocate Team Direct Number: [phone]  Fax: [phone]

## 2022-03-22 NOTE — Telephone Encounter (Signed)
LVM asking pt to call back and schedule Botox. 

## 2022-03-23 ENCOUNTER — Telehealth: Payer: Self-pay | Admitting: *Deleted

## 2022-03-23 ENCOUNTER — Telehealth: Payer: Self-pay | Admitting: Adult Health

## 2022-03-23 ENCOUNTER — Other Ambulatory Visit (HOSPITAL_COMMUNITY): Payer: Self-pay

## 2022-03-23 NOTE — Telephone Encounter (Signed)
Sonya Gibson Key: [id number] - PA Case ID: [id number]   PA Ubrevly complete  waiting on approval

## 2022-03-23 NOTE — Telephone Encounter (Addendum)
Prior Authorization initiated Amphetamine-Dextroamphet ER 20MG  er capsules #60 [employer]  Approved Coverage Starts on: 03/24/2022 12:00:00 AM, Coverage Ends on: 03/24/2023 12:00:00 AM.

## 2022-03-23 NOTE — Telephone Encounter (Signed)
Patient called regarding Adderall prescription. States that when she went to pick up medication she was informed that her insurance wouldn't cover for current prescription of two 20mg  a day. She is unsure if she may need a PA or not. She asked if she should switch back to the 30mg  or what she needs to do. Pls rtc [phone]

## 2022-03-23 NOTE — Telephone Encounter (Signed)
PA initiated 10/4, pending response.

## 2022-03-24 NOTE — Telephone Encounter (Signed)
PA approved 10/5, patient notified.

## 2022-03-24 NOTE — Telephone Encounter (Addendum)
   Botox[employer] benefits scanned to profile 

## 2022-03-25 ENCOUNTER — Other Ambulatory Visit (HOSPITAL_COMMUNITY): Payer: Self-pay

## 2022-03-28 NOTE — Telephone Encounter (Signed)
Pt botox arrived to office. 200U vial x1. Placed in botox fridge.  

## 2022-04-11 NOTE — Telephone Encounter (Signed)
Pt called back to schedule Botox appointment. She was scheduled for 11/7 @ 2:00 pm.

## 2022-04-26 ENCOUNTER — Ambulatory Visit (INDEPENDENT_AMBULATORY_CARE_PROVIDER_SITE_OTHER): Payer: BC Managed Care – PPO | Admitting: Neurology

## 2022-04-26 DIAGNOSIS — G43709 Chronic migraine without aura, not intractable, without status migrainosus: Secondary | ICD-10-CM

## 2022-04-26 MED ORDER — ONABOTULINUMTOXINA 200 UNITS IJ SOLR
155.0000 [IU] | Freq: Once | INTRAMUSCULAR | Status: AC
  Administered 2022-04-26: 155 [IU] via INTRAMUSCULAR

## 2022-04-26 NOTE — Progress Notes (Signed)
Botox- 200 units x 1 vial Lot: [id number] Expiration: 07/2024 NDC: 0023-3921-02  Bacteriostatic 0.9% Sodium Chloride- 4mL total Lot: [id number] Expiration: 02/19/2023 NDC: 0409-1966-02  Dx: G43.709 S/P 

## 2022-04-26 NOTE — Progress Notes (Addendum)
Consent Form Botulism Toxin Injection For Chronic Migraine  Addendum: doing better on botox. 6 migraine days a month < 10 total headache days failed imirex and maxalt will try and get nurtec approved prn First botox  Reviewed orally with patient, additionally signature is on file:  Botulism toxin has been approved by the Federal drug administration for treatment of chronic migraine. Botulism toxin does not cure chronic migraine and it may not be effective in some patients.  The administration of botulism toxin is accomplished by injecting a small amount of toxin into the muscles of the neck and head. Dosage must be titrated for each individual. Any benefits resulting from botulism toxin tend to wear off after 3 months with a repeat injection required if benefit is to be maintained. Injections are usually done every 3-4 months with maximum effect peak achieved by about 2 or 3 weeks. Botulism toxin is expensive and you should be sure of what costs you will incur resulting from the injection.  The side effects of botulism toxin use for chronic migraine may include:   -Transient, and usually mild, facial weakness with facial injections  -Transient, and usually mild, head or neck weakness with head/neck injections  -Reduction or loss of forehead facial animation due to forehead muscle weakness  -Eyelid drooping  -Dry eye  -Pain at the site of injection or bruising at the site of injection  -Double vision  -Potential unknown long term risks  Contraindications: You should not have Botox if you are pregnant, nursing, allergic to albumin, have an infection, skin condition, or muscle weakness at the site of the injection, or have myasthenia gravis, Lambert-Eaton syndrome, or ALS.  It is also possible that as with any injection, there may be an allergic reaction or no effect from the medication. Reduced effectiveness after repeated injections is sometimes seen and rarely infection at the injection site  may occur. All care will be taken to prevent these side effects. If therapy is given over a long time, atrophy and wasting in the muscle injected may occur. Occasionally the patient's become refractory to treatment because they develop antibodies to the toxin. In this event, therapy needs to be modified.  I have read the above information and consent to the administration of botulism toxin.    BOTOX PROCEDURE NOTE FOR MIGRAINE HEADACHE    Contraindications and precautions discussed with patient(above). Aseptic procedure was observed and patient tolerated procedure. Procedure performed by Dr. Georgia Dom  The condition has existed for more than 6 months, and pt does not have a diagnosis of ALS, Myasthenia Gravis or Lambert-Eaton Syndrome.  Risks and benefits of injections discussed and pt agrees to proceed with the procedure.  Written consent obtained  These injections are medically necessary. Pt  receives good benefits from these injections. These injections do not cause sedations or hallucinations which the oral therapies may cause.  Description of procedure:  The patient was placed in a sitting position. The standard protocol was used for Botox as follows, with 5 units of Botox injected at each site:   -Procerus muscle, midline injection  -Corrugator muscle, bilateral injection  -Frontalis muscle, bilateral injection, with 2 sites each side, medial injection was performed in the upper one third of the frontalis muscle, in the region vertical from the medial inferior edge of the superior orbital rim. The lateral injection was again in the upper one third of the forehead vertically above the lateral limbus of the cornea, 1.5 cm lateral to the medial injection site.  -  Temporalis muscle injection, 4 sites, bilaterally. The first injection was 3 cm above the tragus of the ear, second injection site was 1.5 cm to 3 cm up from the first injection site in line with the tragus of the ear. The  third injection site was 1.5-3 cm forward between the first 2 injection sites. The fourth injection site was 1.5 cm posterior to the second injection site.   -Occipitalis muscle injection, 3 sites, bilaterally. The first injection was done one half way between the occipital protuberance and the tip of the mastoid process behind the ear. The second injection site was done lateral and superior to the first, 1 fingerbreadth from the first injection. The third injection site was 1 fingerbreadth superiorly and medially from the first injection site.  -Cervical paraspinal muscle injection, 2 sites, bilateral knee first injection site was 1 cm from the midline of the cervical spine, 3 cm inferior to the lower border of the occipital protuberance. The second injection site was 1.5 cm superiorly and laterally to the first injection site.  -Trapezius muscle injection was performed at 3 sites, bilaterally. The first injection site was in the upper trapezius muscle halfway between the inflection point of the neck, and the acromion. The second injection site was one half way between the acromion and the first injection site. The third injection was done between the first injection site and the inflection point of the neck.   Will return for repeat injection in 3 months.   200 units of Botox was used, any Botox not injected was wasted. The patient tolerated the procedure well, there were no complications of the above procedure.

## 2022-04-27 ENCOUNTER — Encounter: Payer: Self-pay | Admitting: Neurology

## 2022-04-27 ENCOUNTER — Encounter: Payer: Self-pay | Admitting: *Deleted

## 2022-05-03 ENCOUNTER — Other Ambulatory Visit: Payer: Self-pay

## 2022-05-03 ENCOUNTER — Encounter: Payer: Self-pay | Admitting: Neurology

## 2022-05-03 ENCOUNTER — Telehealth: Payer: Self-pay | Admitting: Adult Health

## 2022-05-03 ENCOUNTER — Other Ambulatory Visit: Payer: Self-pay | Admitting: Adult Health

## 2022-05-03 DIAGNOSIS — F902 Attention-deficit hyperactivity disorder, combined type: Secondary | ICD-10-CM

## 2022-05-03 DIAGNOSIS — F410 Panic disorder [episodic paroxysmal anxiety] without agoraphobia: Secondary | ICD-10-CM

## 2022-05-03 MED ORDER — AMPHETAMINE-DEXTROAMPHET ER 20 MG PO CP24: 20.0000 mg | ORAL_CAPSULE | Freq: Two times a day (BID) | ORAL | 0 refills | Status: DC

## 2022-05-03 MED ORDER — LORAZEPAM 0.5 MG PO TABS: 0.5000 mg | ORAL_TABLET | Freq: Every day | ORAL | 0 refills | Status: DC | PRN

## 2022-05-03 NOTE — Telephone Encounter (Signed)
Pended both. 

## 2022-05-03 NOTE — Telephone Encounter (Signed)
Pt would like refill of Lorazepam to  [facility] Jonita Albee, [place] - [address], [place] [address] Phone: [phone]  Fax: [phone]   and Adderall to  [id number] - [place], [place] - [address] AT [facility] [address] [place], [place] [address] Phone: [phone]  Fax: [phone]  It is in stock there.  Next app 11/16

## 2022-05-05 ENCOUNTER — Telehealth (INDEPENDENT_AMBULATORY_CARE_PROVIDER_SITE_OTHER): Payer: BC Managed Care – PPO | Admitting: Adult Health

## 2022-05-05 ENCOUNTER — Telehealth: Payer: BC Managed Care – PPO | Admitting: Adult Health

## 2022-05-05 ENCOUNTER — Encounter: Payer: Self-pay | Admitting: Adult Health

## 2022-05-05 DIAGNOSIS — F411 Generalized anxiety disorder: Secondary | ICD-10-CM | POA: Diagnosis not present

## 2022-05-05 DIAGNOSIS — F3181 Bipolar II disorder: Secondary | ICD-10-CM | POA: Diagnosis not present

## 2022-05-05 DIAGNOSIS — F410 Panic disorder [episodic paroxysmal anxiety] without agoraphobia: Secondary | ICD-10-CM

## 2022-05-05 DIAGNOSIS — F4310 Post-traumatic stress disorder, unspecified: Secondary | ICD-10-CM

## 2022-05-05 DIAGNOSIS — F902 Attention-deficit hyperactivity disorder, combined type: Secondary | ICD-10-CM | POA: Diagnosis not present

## 2022-05-05 DIAGNOSIS — F422 Mixed obsessional thoughts and acts: Secondary | ICD-10-CM

## 2022-05-05 DIAGNOSIS — G4700 Insomnia, unspecified: Secondary | ICD-10-CM

## 2022-05-05 MED ORDER — AMPHETAMINE-DEXTROAMPHETAMINE 20 MG PO TABS: 20.0000 mg | ORAL_TABLET | Freq: Two times a day (BID) | ORAL | 0 refills | Status: DC

## 2022-05-05 MED ORDER — TRAZODONE HCL 150 MG PO TABS: 150.0000 mg | ORAL_TABLET | Freq: Every evening | ORAL | 2 refills | Status: DC | PRN

## 2022-05-05 MED ORDER — DULOXETINE HCL 60 MG PO CPEP: 120.0000 mg | ORAL_CAPSULE | Freq: Every day | ORAL | 2 refills | Status: DC

## 2022-05-05 MED ORDER — LORAZEPAM 0.5 MG PO TABS: 0.5000 mg | ORAL_TABLET | Freq: Every day | ORAL | 2 refills | Status: DC | PRN

## 2022-05-05 NOTE — Progress Notes (Signed)
BRIGGETTE KATT [id number] [birth year] 36 y.o.  Virtual Visit via Video Note  I connected with pt @ on 05/05/22 at  9:40 AM EST by a video enabled telemedicine application and verified that I am speaking with the correct person using two identifiers.   I discussed the limitations of evaluation and management by telemedicine and the availability of in person appointments. The patient expressed understanding and agreed to proceed.  I discussed the assessment and treatment plan with the patient. The patient was provided an opportunity to ask questions and all were answered. The patient agreed with the plan and demonstrated an understanding of the instructions.   The patient was advised to call back or seek an in-person evaluation if the symptoms worsen or if the condition fails to improve as anticipated.  I provided 25 minutes of non-face-to-face time during this encounter.  The patient was located at home.  The provider was located at [place].   Aloha Gell, NP   Subjective:   Patient ID:  Sonya Gibson is a 36 y.o. (DOB 10-28-1985) female.  Chief Complaint: No chief complaint on file.   HPI Sonya Gibson presents for follow-up of PTSD, panic  attacks, obsessional thoughts and acts, depression, ADD - combined type and bipolar disorder - 2.  Describes mood today as "alright". Pleasant. Decreased tearfulness. Mood symptoms - reports depression, anxiety, and irritability - more associated with loss of mother. Reports panic attacks - better overall - large crowds. Decreased outbursts. Reports some worry and rumination. Reports obsessive thoughts - checking and counting behaviors. Feels more organized - things in their place. Moods are more consistent - reports some bad days - not every day. Stating "I'm doing ok right now". Improved interest and motivation. Taking medications as prescribed.  Energy levels improved. Active, does not have a regular exercise routine.  Appetite  adequate. Weight loss - 252 pounds. Sleeps well most nights. Averages 7 to 10 hours Focus and concentration stable. Diagnosed with ADD in childhood. Completing tasks. Managing aspects of household. Works full-time - Customer service manager - staff supportive.  Denies SI or HI.  Denies AH or VH.  Denies self harm. Denies substance use. Uses Delta 8 - working off of it..  Previous medication trials:  Buspar, Hydroxyzine.   Review of Systems:  Review of Systems  Musculoskeletal:  Negative for gait problem.  Neurological:  Negative for tremors.  Psychiatric/Behavioral:         Please refer to HPI    Medications: I have reviewed the patient's current medications.  Current Outpatient Medications  Medication Sig Dispense Refill   amphetamine-dextroamphetamine (ADDERALL) 20 MG tablet Take 1 tablet (20 mg total) by mouth 2 (two) times daily. 60 tablet 0   [START ON 06/02/2022] amphetamine-dextroamphetamine (ADDERALL) 20 MG tablet Take 1 tablet (20 mg total) by mouth 2 (two) times daily. 60 tablet 0   [START ON 06/30/2022] amphetamine-dextroamphetamine (ADDERALL) 20 MG tablet Take 1 tablet (20 mg total) by mouth 2 (two) times daily. 60 tablet 0   acetaminophen (TYLENOL) 325 MG tablet Take 650 mg by mouth every 6 (six) hours as needed.     albuterol (VENTOLIN HFA) 108 (90 Base) MCG/ACT inhaler Inhale 2 puffs into the lungs every 6 (six) hours as needed for wheezing or shortness of breath.     amphetamine-dextroamphetamine (ADDERALL XR) 20 MG 24 hr capsule Take 1 capsule (20 mg total) by mouth 2 (two) times daily. 60 capsule 0   botulinum toxin Type A (  BOTOX) 200 units injection Provider to inject 155 units into the muscles of the head and neck every 12 weeks. Discard remainder. 1 each 1   calcium carbonate (TUMS EX) 750 MG chewable tablet Chew 2 tablets by mouth as needed for heartburn.     DULoxetine (CYMBALTA) 60 MG capsule Take 2 capsules (120 mg total) by mouth daily. 60 capsule 2   gabapentin  (NEURONTIN) 600 MG tablet Take 1,200 mg by mouth 3 (three) times daily.     LORazepam (ATIVAN) 0.5 MG tablet Take 1 tablet (0.5 mg total) by mouth daily as needed for anxiety. 30 tablet 2   Melatonin 10 MG TABS Take 10 mg by mouth daily as needed (sleep).     MOUNJARO 12.5 MG/0.5ML Pen INJECT 12.5 MG INTO SKIN EVERY 7 DAYS     ondansetron (ZOFRAN-ODT) 4 MG disintegrating tablet TAKE 1 TABLET BY MOUTH EVERY 8 HOURS AS NEEDED FOR NAUSEA 30 tablet 3   oxyCODONE-acetaminophen (PERCOCET/ROXICET) 5-325 MG tablet Take 1 tablet by mouth every 6 (six) hours as needed for moderate pain or severe pain. 15 tablet 0   promethazine (PHENERGAN) 25 MG tablet Take 1 tablet (25 mg total) by mouth every 6 (six) hours as needed for nausea or vomiting. 60 tablet 2   rizatriptan (MAXALT-MLT) 10 MG disintegrating tablet Take 1 tablet (10 mg total) by mouth as needed for migraine. May repeat in 2 hours if needed 9 tablet 11   traZODone (DESYREL) 150 MG tablet Take 1 tablet (150 mg total) by mouth at bedtime as needed for sleep. 30 tablet 2   Ubrogepant (UBRELVY) 100 MG TABS Take 100 mg by mouth every 2 (two) hours as needed. Maximum 200mg  a day. 16 tablet 11   No current facility-administered medications for this visit.    Medication Side Effects: None  Allergies:  Allergies  Allergen Reactions   Cinnamon Anaphylaxis    (NO "REAL"/TREE BARK CINNAMON)   Clindamycin Nausea And Vomiting    Other reaction(s): gi distress Chest pain, trouble swallowing Chest pain, trouble swallowing    Coconut (Cocos Nucifera) Anaphylaxis    Can eat "fake" coconut   Lovenox [Enoxaparin] Other (See Comments)    BROKE DOWN THE SKIN AT INJECTION SITE AND CAUSED A WOUND   Nystatin Anaphylaxis   Aspartame     Other reaction(s): Other (See Comments) "Breathing Difficulty, Serious intense migraines".  "Breathing Difficulty, Serious intense migraines".     Niacin Hives   Nitrofurantoin Hives   Other     "Headache cocktail"    Blister to abd   Pregabalin     Other reaction(s): Other (See Comments) Depression with suicidal thoughts Depression with suicidal thoughts     Past Medical History:  Diagnosis Date   Anxiety    Chronic back pain    Depression    Diabetes ([place])    Drug-seeking behavior    Essential hypertension    GERD (gastroesophageal reflux disease)    Hypothyroidism    Kidney stone    Lumbar radiculopathy, right    Migraine headache    PTSD (post-traumatic stress disorder)     Family History  Problem Relation Age of Onset   Diabetes Mellitus II Mother    Hypertension Mother    Seizures Mother    Other Mother        post stroke headaches   Stroke Mother    Hypertension Father    Diabetes Mellitus II Father    Hyperlipidemia Father    Bipolar disorder  Sister    Asthma Son    Autism Son    Tourette syndrome Son    Autism Daughter        level 3   Other Daughter        tibia torsion & femoral aniversion    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    Highest education level: Not on file  Occupational History   Not on file  Tobacco Use   Smoking status: Former    Packs/day: 2.00    Years: 3.00    Total pack years: 6.00    Types: Cigarettes    Quit date: 2006    Years since quitting: 17.8   Smokeless tobacco: Never  Vaping Use   Vaping Use: Never used  Substance and Sexual Activity   Alcohol use: Yes    Comment: socially   Drug use: No   Sexual activity: Yes    Birth control/protection: None  Other Topics Concern   Not on file  Social History Narrative   Lives at home with husband and kids   Right handed   Caffeine: 6 cups daily   Social Determinants of Health   Financial Resource Strain: Not on file  Food Insecurity: Not on file  Transportation Needs: Not on file  Physical Activity: Not on file  Stress: Not on file  Social Connections: Not on  file  Intimate Partner Violence: Not on file    Past Medical History, Surgical history, Social history, and Family history were reviewed and updated as appropriate.   Please see review of systems for further details on the patient's review from today.   Objective:   Physical Exam:  There were no vitals taken for this visit.  Physical Exam Constitutional:      General: She is not in acute distress. Musculoskeletal:        General: No deformity.  Neurological:     Mental Status: She is alert and oriented to person, place, and time.     Coordination: Coordination normal.  Psychiatric:        Attention and Perception: Attention and perception normal. She does not perceive auditory or visual hallucinations.        Mood and Affect: Mood normal. Mood is not anxious or depressed. Affect is not labile, blunt, angry or inappropriate.        Speech: Speech normal.        Behavior: Behavior normal.        Thought Content: Thought content normal. Thought content is not paranoid or delusional. Thought content does not include homicidal or suicidal ideation. Thought content does not include homicidal or suicidal plan.        Cognition and Memory: Cognition and memory normal.        Judgment: Judgment normal.     Comments: Insight intact     Lab Review:     Component Value Date/Time   NA 135 05/03/2021 1750   K 3.8 05/03/2021 1750   CL 102 05/03/2021 1750   CO2 26 05/03/2021 1750   GLUCOSE 107 (H) 05/03/2021 1750   BUN 8 05/03/2021 1750   CREATININE 0.73 05/03/2021 1750   CALCIUM 8.6 (L) 05/03/2021 1750   PROT 7.0 05/03/2021 1750   ALBUMIN 3.6 05/03/2021 1750   AST 13 (L) 05/03/2021 1750   ALT 13 05/03/2021 1750  ALKPHOS 76 05/03/2021 1750   BILITOT 0.4 05/03/2021 1750   GFRNONAA >60 05/03/2021 1750   GFRAA >60 02/21/2020 1033       Component Value Date/Time   WBC 8.3 05/03/2021 1750   RBC 4.24 05/03/2021 1750   HGB 11.9 (L) 05/03/2021 1750   HCT 36.9 05/03/2021 1750    PLT 372 05/03/2021 1750   MCV 87.0 05/03/2021 1750   MCH 28.1 05/03/2021 1750   MCHC 32.2 05/03/2021 1750   RDW 14.5 05/03/2021 1750   LYMPHSABS 3.5 05/03/2021 1750   MONOABS 0.4 05/03/2021 1750   EOSABS 0.2 05/03/2021 1750   BASOSABS 0.0 05/03/2021 1750    No results found for: "POCLITH", "LITHIUM"   No results found for: "PHENYTOIN", "PHENOBARB", "VALPROATE", "CBMZ"   .res Assessment: Plan:    Plan:  PDMP reviewed  Adderall XR 20mg  BID Cymbalta 60mg  - 2 daily Trazadone 150mg  at bedtime - takes 50mg  to 150 - typically 75mg  at hs Lorazepam 0.5mg  daily prn panic attacks - does not take every day.  Monitor BP between visits while taking stimulant medication.   Time spent with patient was 25 minutes. Greater than 50% of face to face time with patient was spent on counseling and coordination of care.    RTC 3 months  Patient advised to contact office with any questions, adverse effects, or acute worsening in signs and symptoms.  Discussed potential benefits, risk, and side effects of benzodiazepines to include potential risk of tolerance and dependence, as well as possible drowsiness.  Advised patient not to drive if experiencing drowsiness and to take lowest possible effective dose to minimize risk of dependence and tolerance.   Discussed potential metabolic side effects associated with atypical antipsychotics, as well as potential risk for movement side effects. Advised pt to contact office if movement side effects occur.     Diagnoses and all orders for this visit:  Bipolar II disorder (HCC)  Generalized anxiety disorder -     DULoxetine (CYMBALTA) 60 MG capsule; Take 2 capsules (120 mg total) by mouth daily.  Attention deficit hyperactivity disorder (ADHD), combined type  Mixed obsessional thoughts and acts -     DULoxetine (CYMBALTA) 60 MG capsule; Take 2 capsules (120 mg total) by mouth daily.  PTSD (post-traumatic stress disorder) -     DULoxetine (CYMBALTA) 60  MG capsule; Take 2 capsules (120 mg total) by mouth daily.  Panic attacks -     LORazepam (ATIVAN) 0.5 MG tablet; Take 1 tablet (0.5 mg total) by mouth daily as needed for anxiety.  Insomnia, unspecified type -     traZODone (DESYREL) 150 MG tablet; Take 1 tablet (150 mg total) by mouth at bedtime as needed for sleep.  Other orders -     amphetamine-dextroamphetamine (ADDERALL) 20 MG tablet; Take 1 tablet (20 mg total) by mouth 2 (two) times daily. -     amphetamine-dextroamphetamine (ADDERALL) 20 MG tablet; Take 1 tablet (20 mg total) by mouth 2 (two) times daily. -     amphetamine-dextroamphetamine (ADDERALL) 20 MG tablet; Take 1 tablet (20 mg total) by mouth 2 (two) times daily.     Please see After Visit Summary for patient specific instructions.  Future Appointments  Date Time Provider Department Center  07/19/2022  2:00 PM , MD [facility] None    No orders of the defined types were placed in this encounter.     -------------------------------

## 2022-05-09 ENCOUNTER — Other Ambulatory Visit: Payer: Self-pay | Admitting: Neurology

## 2022-05-09 MED ORDER — NURTEC 75 MG PO TBDP: 75.0000 mg | ORAL_TABLET | Freq: Every day | ORAL | 6 refills | Status: DC | PRN

## 2022-05-09 MED ORDER — METHYLPREDNISOLONE 4 MG PO TBPK: ORAL_TABLET | ORAL | 1 refills | Status: DC

## 2022-05-16 ENCOUNTER — Emergency Department (HOSPITAL_COMMUNITY)
Admission: EM | Admit: 2022-05-16 | Discharge: 2022-05-16 | Disposition: A | Discharge status: Home / Self Care | Payer: BC Managed Care – PPO | Attending: Emergency Medicine | Admitting: Emergency Medicine

## 2022-05-16 ENCOUNTER — Other Ambulatory Visit: Payer: Self-pay

## 2022-05-16 ENCOUNTER — Emergency Department (HOSPITAL_COMMUNITY): Payer: BC Managed Care – PPO

## 2022-05-16 DIAGNOSIS — R930 Abnormal findings on diagnostic imaging of skull and head, not elsewhere classified: Secondary | ICD-10-CM | POA: Insufficient documentation

## 2022-05-16 DIAGNOSIS — M549 Dorsalgia, unspecified: Secondary | ICD-10-CM | POA: Diagnosis present

## 2022-05-16 DIAGNOSIS — R202 Paresthesia of skin: Secondary | ICD-10-CM | POA: Diagnosis not present

## 2022-05-16 DIAGNOSIS — W182XXA Fall in (into) shower or empty bathtub, initial encounter: Secondary | ICD-10-CM | POA: Insufficient documentation

## 2022-05-16 DIAGNOSIS — W19XXXA Unspecified fall, initial encounter: Secondary | ICD-10-CM

## 2022-05-16 DIAGNOSIS — R3911 Hesitancy of micturition: Secondary | ICD-10-CM | POA: Diagnosis not present

## 2022-05-16 DIAGNOSIS — R319 Hematuria, unspecified: Secondary | ICD-10-CM

## 2022-05-16 LAB — URINALYSIS, ROUTINE W REFLEX MICROSCOPIC
Bacteria, UA: NONE SEEN
Bilirubin Urine: NEGATIVE
Glucose, UA: NEGATIVE mg/dL
Ketones, ur: NEGATIVE mg/dL
Leukocytes,Ua: NEGATIVE
Nitrite: NEGATIVE
Protein, ur: 30 mg/dL — AB
Specific Gravity, Urine: 1.024 (ref 1.005–1.030)
pH: 5 (ref 5.0–8.0)

## 2022-05-16 LAB — CBC WITH DIFFERENTIAL/PLATELET
Abs Immature Granulocytes: 0.03 10*3/uL (ref 0.00–0.07)
Basophils Absolute: 0 10*3/uL (ref 0.0–0.1)
Basophils Relative: 0 %
Eosinophils Absolute: 0.1 10*3/uL (ref 0.0–0.5)
Eosinophils Relative: 1 %
HCT: 41.7 % (ref 36.0–46.0)
Hemoglobin: 14.1 g/dL (ref 12.0–15.0)
Immature Granulocytes: 0 %
Lymphocytes Relative: 38 %
Lymphs Abs: 4.2 10*3/uL — ABNORMAL HIGH (ref 0.7–4.0)
MCH: 29.4 pg (ref 26.0–34.0)
MCHC: 33.8 g/dL (ref 30.0–36.0)
MCV: 86.9 fL (ref 80.0–100.0)
Monocytes Absolute: 0.4 10*3/uL (ref 0.1–1.0)
Monocytes Relative: 4 %
Neutro Abs: 6.4 10*3/uL (ref 1.7–7.7)
Neutrophils Relative %: 57 %
Platelets: 415 10*3/uL — ABNORMAL HIGH (ref 150–400)
RBC: 4.8 MIL/uL (ref 3.87–5.11)
RDW: 15.4 % (ref 11.5–15.5)
WBC: 11.1 10*3/uL — ABNORMAL HIGH (ref 4.0–10.5)
nRBC: 0 % (ref 0.0–0.2)

## 2022-05-16 LAB — BASIC METABOLIC PANEL
Anion gap: 9 (ref 5–15)
BUN: 14 mg/dL (ref 6–20)
CO2: 26 mmol/L (ref 22–32)
Calcium: 8.8 mg/dL — ABNORMAL LOW (ref 8.9–10.3)
Chloride: 104 mmol/L (ref 98–111)
Creatinine, Ser: 0.73 mg/dL (ref 0.44–1.00)
GFR, Estimated: >60 mL/min (ref >60)
Glucose, Bld: 97 mg/dL (ref 70–99)
Potassium: 3.5 mmol/L (ref 3.5–5.1)
Sodium: 139 mmol/L (ref 135–145)

## 2022-05-16 LAB — PREGNANCY, URINE: Preg Test, Ur: NEGATIVE

## 2022-05-16 MED ORDER — HYDROMORPHONE HCL 1 MG/ML IJ SOLN
2.0000 mg | Freq: Once | INTRAMUSCULAR | Status: AC
  Administered 2022-05-16: 2 mg via INTRAVENOUS
  Filled 2022-05-16: qty 2

## 2022-05-16 MED ORDER — TAMSULOSIN HCL 0.4 MG PO CAPS: 0.4000 mg | ORAL_CAPSULE | Freq: Every day | ORAL | 0 refills | Status: AC

## 2022-05-16 MED ORDER — TAMSULOSIN HCL 0.4 MG PO CAPS
0.4000 mg | ORAL_CAPSULE | Freq: Every day | ORAL | Status: DC
  Administered 2022-05-16: 0.4 mg via ORAL
  Filled 2022-05-16: qty 1

## 2022-05-16 NOTE — ED Notes (Signed)
Pt returned from MRI. Attempting to give urine sample

## 2022-05-16 NOTE — ED Provider Notes (Signed)
[facility] EMERGENCY DEPARTMENT Provider Note   CSN: [id number] Arrival date & time: 05/16/22  1417     History {Add pertinent medical, surgical, social history, OB history to HPI:1} Chief Complaint  Patient presents with   Sonya Gibson    Sonya Gibson is a 36 y.o. female with a hx of spinal surgery, chronic weakness and paresthesias in the right leg, presenting to emergency department with a fall and back pain.  Patient reports that she fell in the bathtub today, landed on her butt, and has been having significant worsening of her chronic back pain.  She says it radiates into both of her legs.  She has not been able to urinate since her fall this morning.  She has had problems with incontinence in the past when she injured her back.  She is stable to ambulate.  She has tried her home medications for pain including oxycodone, muscle relaxers, lidocaine patches, but nothing is controlling her pain.  She is in pain management.  She is here with her husband at bedside.  Last MRI L spine 07/31/20:   IMPRESSION: 1. Symptomatic level appears to be L4-L5 where a caudal disc extrusion with sequestered disc fragment in the right lateral recess is new since 2020. Query right L5 radiculitis. Prior surgery at this level. Mild overall spinal stenosis.   2. 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  HPI     Home Medications Prior to Admission medications   Medication Sig Start Date End Date Taking? Authorizing Provider  acetaminophen (TYLENOL) 325 MG tablet Take 650 mg by mouth every 6 (six) hours as needed.    [provider]  albuterol (VENTOLIN HFA) 108 (90 Base) MCG/ACT inhaler Inhale 2 puffs into the lungs every 6 (six) hours as needed for wheezing or shortness of breath.    [provider]  amphetamine-dextroamphetamine (ADDERALL XR) 20 MG 24 hr capsule Take 1 capsule (20 mg total) by mouth 2 (two)  times daily. 05/03/22   Mozingo, Thereasa Solo, NP  amphetamine-dextroamphetamine (ADDERALL) 20 MG tablet Take 1 tablet (20 mg total) by mouth 2 (two) times daily. 05/05/22   Mozingo, Thereasa Solo, NP  amphetamine-dextroamphetamine (ADDERALL) 20 MG tablet Take 1 tablet (20 mg total) by mouth 2 (two) times daily. 06/02/22   Mozingo, Thereasa Solo, NP  amphetamine-dextroamphetamine (ADDERALL) 20 MG tablet Take 1 tablet (20 mg total) by mouth 2 (two) times daily. 06/30/22   Mozingo, Thereasa Solo, NP  botulinum toxin Type A (BOTOX) 200 units injection Provider to inject 155 units into the muscles of the head and neck every 12 weeks. Discard remainder. 03/22/22   Anson Fret, MD  calcium carbonate (TUMS EX) 750 MG chewable tablet Chew 2 tablets by mouth as needed for heartburn.    [provider]  DULoxetine (CYMBALTA) 60 MG capsule Take 2 capsules (120 mg total) by mouth daily. 05/05/22   Mozingo, Thereasa Solo, NP  gabapentin (NEURONTIN) 600 MG tablet Take 1,200 mg by mouth 3 (three) times daily.    [provider]  LORazepam (ATIVAN) 0.5 MG tablet Take 1 tablet (0.5 mg total) by mouth daily as needed for anxiety. 05/05/22   Mozingo, Thereasa Solo, NP  Melatonin 10 MG TABS Take 10 mg by mouth daily as needed (sleep).    [provider]  methylPREDNISolone (MEDROL DOSEPAK) 4 MG TBPK tablet Take pills daily all together with food. Take the first dose (6  pills) as soon as possible. Take the rest each morning. For 6 days total 6-5-4-3-2-1. 05/09/22   Anson Fret, MD  MOUNJARO 12.5 MG/0.5ML Pen INJECT 12.5 MG INTO SKIN EVERY 7 DAYS 04/25/22   [provider]  ondansetron (ZOFRAN-ODT) 4 MG disintegrating tablet TAKE 1 TABLET BY MOUTH EVERY 8 HOURS AS NEEDED FOR NAUSEA 12/29/21   Anson Fret, MD  oxyCODONE-acetaminophen (PERCOCET/ROXICET) 5-325 MG tablet Take 1 tablet by mouth every 6 (six) hours as needed for moderate pain or severe pain. 06/04/20    Vanetta Mulders, MD  promethazine (PHENERGAN) 25 MG tablet Take 1 tablet (25 mg total) by mouth every 6 (six) hours as needed for nausea or vomiting. 07/06/18   Anson Fret, MD  Rimegepant Sulfate (NURTEC) 75 MG TBDP Take 75 mg by mouth daily as needed. For migraines. Take as close to onset of migraine as possible. One daily maximum. 05/09/22   Anson Fret, MD  rizatriptan (MAXALT-MLT) 10 MG disintegrating tablet Take 1 tablet (10 mg total) by mouth as needed for migraine. May repeat in 2 hours if needed 05/12/21   Anson Fret, MD  traZODone (DESYREL) 150 MG tablet Take 1 tablet (150 mg total) by mouth at bedtime as needed for sleep. 05/05/22   Mozingo, Thereasa Solo, NP  levETIRAcetam (KEPPRA) 500 MG tablet Take 1 tablet (500 mg total) by mouth 2 (two) times daily. 07/10/18 07/12/19  Catarina Hartshorn, MD  metoprolol tartrate (LOPRESSOR) 25 MG tablet Take 0.5 tablets (12.5 mg total) by mouth 2 (two) times daily. 07/10/18 06/02/19  Catarina Hartshorn, MD  venlafaxine XR (EFFEXOR-XR) 75 MG 24 hr capsule Take 75 mg by mouth at bedtime.  12/12/17 06/02/19  [provider]      Allergies    Cinnamon, Clindamycin, Coconut (cocos nucifera), Lovenox [enoxaparin], Nystatin, Aspartame, Niacin, Nitrofurantoin, Other, and Pregabalin    Review of Systems   Review of Systems  Physical Exam Updated Vital Signs BP 125/79 (BP Location: Right Arm)   Pulse (!) 106   Temp 98.5 F (36.9 C) (Oral)   Resp 20   Ht 5\' 2"  (1.575 m)   Wt 113.4 kg   LMP 04/25/2022   SpO2 98%   BMI 45.73 kg/m  Physical Exam Constitutional:      General: She is not in acute distress. HENT:     Head: Normocephalic and atraumatic.  Eyes:     Conjunctiva/sclera: Conjunctivae normal.     Pupils: Pupils are equal, round, and reactive to light.  Cardiovascular:     Rate and Rhythm: Normal rate and regular rhythm.  Pulmonary:     Effort: Pulmonary effort is normal. No respiratory distress.  Abdominal:     General: There  is no distension.     Tenderness: There is no abdominal tenderness.  Skin:    General: Skin is warm and dry.  Neurological:     Mental Status: She is alert. Mental status is at baseline.     Comments: Paresthesia to fine touch in all dermatomes of the right leg, left leg sensation intact.  Hip, knee, and ankle flexion and extension muscles intact.  No saddle anesthesia.  Psychiatric:        Mood and Affect: Mood normal.        Behavior: Behavior normal.     ED Results / Procedures / Treatments   Labs (all labs ordered are listed, but only abnormal results are displayed) Labs Reviewed  CBC WITH DIFFERENTIAL/PLATELET - Abnormal; Notable for  the following components:      Result Value   WBC 11.1 (*)    Platelets 415 (*)    Lymphs Abs 4.2 (*)    All other components within normal limits  BASIC METABOLIC PANEL - Abnormal; Notable for the following components:   Calcium 8.8 (*)    All other components within normal limits  URINALYSIS, ROUTINE W REFLEX MICROSCOPIC  PREGNANCY, URINE    EKG None  Radiology No results found.  Procedures Procedures  {Document cardiac monitor, telemetry assessment procedure when appropriate:1}  Medications Ordered in ED Medications  HYDROmorphone (DILAUDID) injection 2 mg (has no administration in time range)    ED Course/ Medical Decision Making/ A&P                           Medical Decision Making Amount and/or Complexity of Data Reviewed Labs: ordered. Radiology: ordered.  Risk Prescription drug management.   Patient is here with a mechanical fall complaining of worsening back pain and now inability to urinate.  Differential would include worsening of her chronic pain versus cauda equina syndrome versus other.  She has had bladder emptying problems in the past, and on bladder scan here at only 200 cc of urine, and has not been drinking water today.  I advised we try some pain medication and see if she is able to urinate afterwards.   She and her husband are in agreement.  I have ordered repeat MRI of the lumbar spine, reviewed the images and radiologist interpretation, and agree, noting ***  Supplemental history is provided by the patient's husband at the bedside.  Note, the patient's right leg paresthesias are chronic from her prior spinal injuries, she has good strength in the lower extremities and is able to ambulate.  She was given Dilaudid for pain control in the ED with some improvement.  She has an appointment tomorrow at the pain management clinic which I think would be good follow-up.  {Document critical care time when appropriate:1} {Document review of labs and clinical decision tools ie heart score, Chads2Vasc2 etc:1}  {Document your independent review of radiology images, and any outside records:1} {Document your discussion with family members, caretakers, and with consultants:1} {Document social determinants of health affecting pt's care:1} {Document your decision making why or why not admission, treatments were needed:1} Final Clinical Impression(s) / ED Diagnoses Final diagnoses:  None    Rx / DC Orders ED Discharge Orders     None

## 2022-05-16 NOTE — ED Notes (Signed)
Pt attempting to urinate again.

## 2022-05-16 NOTE — ED Notes (Signed)
Pt checked sugar with dexcom and was 96. Pt took off for MRI.

## 2022-05-16 NOTE — ED Notes (Signed)
See triage notes. Pt states she is hurting around L5 where surgery was after falling today and into right hip and thigh. No obvious deformity noted.

## 2022-05-16 NOTE — Discharge Instructions (Addendum)
Please follow-up in the pain management clinic tomorrow as scheduled.  I prescribed you Flomax which is a medicine to help stimulate urination and water flowing through the kidneys.  But you need to drink lots of water at home to stay hydrated.  If you are not able to urinate in the next 12 hours, or if your abdominal pain is getting worse, you should return to the ER.

## 2022-05-16 NOTE — ED Notes (Signed)
Report given to Ginger RN. 

## 2022-05-16 NOTE — ED Triage Notes (Signed)
Pt presents for fall from this morning, since fall pt hasn't been able to void also has headache, history of spine surgery, last voided around 630 am this morning.

## 2022-05-16 NOTE — ED Notes (Signed)
Pt taken to MRI  

## 2022-05-16 NOTE — ED Notes (Signed)
Foley catheter was removed with out complications. DC papers reviewed with patient and significant other. She agree to f/u with pain management.  Pt alert, oriented, and ambulatory up on DC. Sonya Gibson

## 2022-05-30 ENCOUNTER — Telehealth: Payer: Self-pay | Admitting: *Deleted

## 2022-05-30 ENCOUNTER — Telehealth: Payer: Self-pay | Admitting: Neurology

## 2022-05-30 NOTE — Telephone Encounter (Signed)
Sonya Gibson is calling. Wanting to follow-up on the PA for medication Nurtec and wanted to know have the office received it. PA can be faxed to [phone]

## 2022-05-30 NOTE — Telephone Encounter (Signed)
Approved today PA Case: [id number], Status: Approved, Coverage Starts on: 05/30/2022 12:00:00 AM, Coverage Ends on: 05/30/2023 12:00:00 AM.

## 2022-05-30 NOTE — Telephone Encounter (Signed)
Completed Nurtec PA on Cover My Meds. Key: [id number] - PA Case ID: [id number]. Also faxed notes to [facility] to upload to key. Awaiting determination from [employer] [facility].

## 2022-05-30 NOTE — Telephone Encounter (Signed)
This was completed in another encounter.

## 2022-06-10 ENCOUNTER — Other Ambulatory Visit (HOSPITAL_COMMUNITY): Payer: Self-pay

## 2022-07-05 ENCOUNTER — Other Ambulatory Visit (HOSPITAL_COMMUNITY): Payer: Self-pay

## 2022-07-07 ENCOUNTER — Other Ambulatory Visit (HOSPITAL_COMMUNITY): Payer: Self-pay

## 2022-07-08 ENCOUNTER — Other Ambulatory Visit (HOSPITAL_COMMUNITY): Payer: Self-pay

## 2022-07-11 ENCOUNTER — Other Ambulatory Visit (HOSPITAL_COMMUNITY): Payer: Self-pay

## 2022-07-19 ENCOUNTER — Ambulatory Visit (INDEPENDENT_AMBULATORY_CARE_PROVIDER_SITE_OTHER): Payer: BC Managed Care – PPO | Admitting: Neurology

## 2022-07-19 DIAGNOSIS — G43709 Chronic migraine without aura, not intractable, without status migrainosus: Secondary | ICD-10-CM | POA: Diagnosis not present

## 2022-07-19 MED ORDER — ONABOTULINUMTOXINA 200 UNITS IJ SOLR
155.0000 [IU] | Freq: Once | INTRAMUSCULAR | Status: AC
  Administered 2022-07-19: 155 [IU] via INTRAMUSCULAR

## 2022-07-19 NOTE — Progress Notes (Signed)
Consent Form Botulism Toxin Injection For Chronic Migraine  07/19/2022: doing better on botox. 6 migraine days a month < 10 total headache days failed imirex and maxalt will try and get nurtec approved prn. Got nurtec approved she loves it. She drives from [place]. First botox  Reviewed orally with patient, additionally signature is on file:  Botulism toxin has been approved by the Federal drug administration for treatment of chronic migraine. Botulism toxin does not cure chronic migraine and it may not be effective in some patients.  The administration of botulism toxin is accomplished by injecting a small amount of toxin into the muscles of the neck and head. Dosage must be titrated for each individual. Any benefits resulting from botulism toxin tend to wear off after 3 months with a repeat injection required if benefit is to be maintained. Injections are usually done every 3-4 months with maximum effect peak achieved by about 2 or 3 weeks. Botulism toxin is expensive and you should be sure of what costs you will incur resulting from the injection.  The side effects of botulism toxin use for chronic migraine may include:   -Transient, and usually mild, facial weakness with facial injections  -Transient, and usually mild, head or neck weakness with head/neck injections  -Reduction or loss of forehead facial animation due to forehead muscle weakness  -Eyelid drooping  -Dry eye  -Pain at the site of injection or bruising at the site of injection  -Double vision  -Potential unknown long term risks  Contraindications: You should not have Botox if you are pregnant, nursing, allergic to albumin, have an infection, skin condition, or muscle weakness at the site of the injection, or have myasthenia gravis, Lambert-Eaton syndrome, or ALS.  It is also possible that as with any injection, there may be an allergic reaction or no effect from the medication. Reduced effectiveness after repeated  injections is sometimes seen and rarely infection at the injection site may occur. All care will be taken to prevent these side effects. If therapy is given over a long time, atrophy and wasting in the muscle injected may occur. Occasionally the patient's become refractory to treatment because they develop antibodies to the toxin. In this event, therapy needs to be modified.  I have read the above information and consent to the administration of botulism toxin.    BOTOX PROCEDURE NOTE FOR MIGRAINE HEADACHE    Contraindications and precautions discussed with patient(above). Aseptic procedure was observed and patient tolerated procedure. Procedure performed by Dr. Georgia Dom  The condition has existed for more than 6 months, and pt does not have a diagnosis of ALS, Myasthenia Gravis or Lambert-Eaton Syndrome.  Risks and benefits of injections discussed and pt agrees to proceed with the procedure.  Written consent obtained  These injections are medically necessary. Pt  receives good benefits from these injections. These injections do not cause sedations or hallucinations which the oral therapies may cause.  Description of procedure:  The patient was placed in a sitting position. The standard protocol was used for Botox as follows, with 5 units of Botox injected at each site:   -Procerus muscle, midline injection  -Corrugator muscle, bilateral injection  -Frontalis muscle, bilateral injection, with 2 sites each side, medial injection was performed in the upper one third of the frontalis muscle, in the region vertical from the medial inferior edge of the superior orbital rim. The lateral injection was again in the upper one third of the forehead vertically above the lateral limbus of  the cornea, 1.5 cm lateral to the medial injection site.  -Temporalis muscle injection, 4 sites, bilaterally. The first injection was 3 cm above the tragus of the ear, second injection site was 1.5 cm to 3 cm up  from the first injection site in line with the tragus of the ear. The third injection site was 1.5-3 cm forward between the first 2 injection sites. The fourth injection site was 1.5 cm posterior to the second injection site.   -Occipitalis muscle injection, 3 sites, bilaterally. The first injection was done one half way between the occipital protuberance and the tip of the mastoid process behind the ear. The second injection site was done lateral and superior to the first, 1 fingerbreadth from the first injection. The third injection site was 1 fingerbreadth superiorly and medially from the first injection site.  -Cervical paraspinal muscle injection, 2 sites, bilateral knee first injection site was 1 cm from the midline of the cervical spine, 3 cm inferior to the lower border of the occipital protuberance. The second injection site was 1.5 cm superiorly and laterally to the first injection site.  -Trapezius muscle injection was performed at 3 sites, bilaterally. The first injection site was in the upper trapezius muscle halfway between the inflection point of the neck, and the acromion. The second injection site was one half way between the acromion and the first injection site. The third injection was done between the first injection site and the inflection point of the neck.   Will return for repeat injection in 3 months.   200 units of Botox was used, any Botox not injected was wasted. The patient tolerated the procedure well, there were no complications of the above procedure.

## 2022-07-19 NOTE — Progress Notes (Signed)
Botox- 200 units x 1 vial Lot: [id number] Expiration: 11/2024 NDC: 0023-3921-02  Bacteriostatic 0.9% Sodium Chloride- 4mL total Lot: [id number] Expiration: 11/25 NDC: 63323-924-03  Dx: G43.709 S/P   

## 2022-07-27 ENCOUNTER — Other Ambulatory Visit: Payer: Self-pay

## 2022-07-27 ENCOUNTER — Telehealth: Payer: Self-pay | Admitting: Adult Health

## 2022-07-27 DIAGNOSIS — F411 Generalized anxiety disorder: Secondary | ICD-10-CM

## 2022-07-27 DIAGNOSIS — F4310 Post-traumatic stress disorder, unspecified: Secondary | ICD-10-CM

## 2022-07-27 DIAGNOSIS — G4700 Insomnia, unspecified: Secondary | ICD-10-CM

## 2022-07-27 DIAGNOSIS — F422 Mixed obsessional thoughts and acts: Secondary | ICD-10-CM

## 2022-07-27 MED ORDER — TRAZODONE HCL 150 MG PO TABS: 150.0000 mg | ORAL_TABLET | Freq: Every evening | ORAL | 2 refills | Status: DC | PRN

## 2022-07-27 MED ORDER — DULOXETINE HCL 60 MG PO CPEP: 120.0000 mg | ORAL_CAPSULE | Freq: Every day | ORAL | 0 refills | Status: DC

## 2022-07-27 NOTE — Telephone Encounter (Signed)
Pt would like refill on Adderall (XR or regular), she says the pharmacy has both.  She also would like Trazadone, Cymbalta and Lorazepam sent in.  [facility] [phone] - [address], [address] [place], [place] [address] Phone: [phone]  Fax: [phone]   Next appt 2/9

## 2022-07-27 NOTE — Telephone Encounter (Signed)
Sent meds but ativan and adderall are not due

## 2022-07-29 ENCOUNTER — Ambulatory Visit: Payer: BC Managed Care – PPO | Admitting: Adult Health

## 2022-08-12 ENCOUNTER — Ambulatory Visit (INDEPENDENT_AMBULATORY_CARE_PROVIDER_SITE_OTHER): Payer: Self-pay | Admitting: Adult Health

## 2022-08-12 DIAGNOSIS — F489 Nonpsychotic mental disorder, unspecified: Secondary | ICD-10-CM

## 2022-08-12 NOTE — Progress Notes (Signed)
Patient no show appointment. Called and LVM to return call.

## 2022-08-18 ENCOUNTER — Encounter: Payer: Self-pay | Admitting: Radiology

## 2022-08-22 ENCOUNTER — Other Ambulatory Visit (HOSPITAL_COMMUNITY): Payer: Self-pay

## 2022-08-22 ENCOUNTER — Telehealth: Payer: Self-pay | Admitting: Neurology

## 2022-08-22 NOTE — Telephone Encounter (Signed)
Pt is scheduled for botox injection for 10/11/22 and will need a new auth before appointment, current PA expires 09/18/22

## 2022-08-22 NOTE — Telephone Encounter (Signed)
Benefit Verification [id number] Submitted!

## 2022-08-22 NOTE — Telephone Encounter (Signed)
Pharmacy Patient Advocate Encounter   Received notification from [facility] that prior authorization for Botox 200 Units is required/requested.  PA submitted on 08/22/2022 to (ins) [employer] via [employer] or (Medicaid) confirmation # [id number] Status is pending

## 2022-08-29 NOTE — Telephone Encounter (Signed)
Botox denial- full letter in Media

## 2022-08-30 NOTE — Telephone Encounter (Signed)
Expedited Appeal faxed to [facility]. Will update once we receive a response.  Fax# [phone] Phone# [phone]

## 2022-09-06 ENCOUNTER — Other Ambulatory Visit: Payer: Self-pay | Admitting: Adult Health

## 2022-09-06 ENCOUNTER — Telehealth: Payer: Self-pay | Admitting: Adult Health

## 2022-09-06 DIAGNOSIS — F902 Attention-deficit hyperactivity disorder, combined type: Secondary | ICD-10-CM

## 2022-09-06 MED ORDER — AMPHETAMINE-DEXTROAMPHETAMINE 20 MG PO TABS: 20.0000 mg | ORAL_TABLET | Freq: Two times a day (BID) | ORAL | 0 refills | Status: DC

## 2022-09-06 NOTE — Telephone Encounter (Signed)
Adderall script sent.

## 2022-09-06 NOTE — Telephone Encounter (Signed)
Sonya Gibson called at 2:20 to request refill of all her medications.  I told her to have the pharmacy send the request.  However, she does need refill of her Adderall as well. Appt 4/10.  Send to [facility] in [place].  She said they have it.

## 2022-09-14 NOTE — Telephone Encounter (Signed)
Checking in to see if they've given you any update on this appeal.

## 2022-09-15 ENCOUNTER — Other Ambulatory Visit (HOSPITAL_COMMUNITY): Payer: Self-pay

## 2022-09-15 NOTE — Telephone Encounter (Signed)
I spoke with Insurance today and they have approved the appeal.  Approval dates are 08/30/2022-08/29/2023  Pharmacy Benefits  Reference number is [id number]  This can be filled with [facility]  Copay is Zero

## 2022-09-27 ENCOUNTER — Other Ambulatory Visit: Payer: Self-pay | Admitting: Neurology

## 2022-09-27 ENCOUNTER — Other Ambulatory Visit: Payer: Self-pay

## 2022-09-28 ENCOUNTER — Telehealth (INDEPENDENT_AMBULATORY_CARE_PROVIDER_SITE_OTHER): Payer: BC Managed Care – PPO | Admitting: Adult Health

## 2022-09-28 ENCOUNTER — Encounter: Payer: Self-pay | Admitting: Adult Health

## 2022-09-28 DIAGNOSIS — F4310 Post-traumatic stress disorder, unspecified: Secondary | ICD-10-CM | POA: Diagnosis not present

## 2022-09-28 DIAGNOSIS — G4700 Insomnia, unspecified: Secondary | ICD-10-CM

## 2022-09-28 DIAGNOSIS — F422 Mixed obsessional thoughts and acts: Secondary | ICD-10-CM

## 2022-09-28 DIAGNOSIS — F902 Attention-deficit hyperactivity disorder, combined type: Secondary | ICD-10-CM

## 2022-09-28 DIAGNOSIS — F410 Panic disorder [episodic paroxysmal anxiety] without agoraphobia: Secondary | ICD-10-CM | POA: Diagnosis not present

## 2022-09-28 DIAGNOSIS — F411 Generalized anxiety disorder: Secondary | ICD-10-CM

## 2022-09-28 DIAGNOSIS — F3181 Bipolar II disorder: Secondary | ICD-10-CM

## 2022-09-28 MED ORDER — AMPHETAMINE-DEXTROAMPHET ER 30 MG PO CP24: 30.0000 mg | ORAL_CAPSULE | Freq: Every day | ORAL | 0 refills | Status: DC

## 2022-09-28 MED ORDER — TRAZODONE HCL 150 MG PO TABS: 150.0000 mg | ORAL_TABLET | Freq: Every evening | ORAL | 5 refills | Status: DC | PRN

## 2022-09-28 MED ORDER — DULOXETINE HCL 60 MG PO CPEP: 120.0000 mg | ORAL_CAPSULE | Freq: Every day | ORAL | 5 refills | Status: DC

## 2022-09-28 MED ORDER — QUETIAPINE FUMARATE 50 MG PO TABS: 50.0000 mg | ORAL_TABLET | Freq: Every day | ORAL | 2 refills | Status: DC

## 2022-09-28 MED ORDER — LORAZEPAM 0.5 MG PO TABS: 0.5000 mg | ORAL_TABLET | Freq: Every day | ORAL | 2 refills | Status: DC | PRN

## 2022-09-28 NOTE — Progress Notes (Signed)
Sonya Gibson [id number] 1986-05-20 37 y.o.  Virtual Visit via Video Note  I connected with pt @ on 09/28/22 at  1:20 PM EDT by a video enabled telemedicine application and verified that I am speaking with the correct person using two identifiers.   I discussed the limitations of evaluation and management by telemedicine and the availability of in person appointments. The patient expressed understanding and agreed to proceed.  I discussed the assessment and treatment plan with the patient. The patient was provided an opportunity to ask questions and all were answered. The patient agreed with the plan and demonstrated an understanding of the instructions.   The patient was advised to call back or seek an in-person evaluation if the symptoms worsen or if the condition fails to improve as anticipated.  I provided 25 minutes of non-face-to-face time during this encounter.  The patient was located at home.  The provider was located at [facility].   Dorothyann Gibbs, NP   Subjective:   Patient ID:  Sonya Gibson is a 37 y.o. (DOB 24-Oct-1985) female.  Chief Complaint: No chief complaint on file.   HPI Sonya Gibson presents for follow-up of PTSD, panic  attacks, obsessional thoughts and acts, depression, ADD - combined type and bipolar disorder - 2.  Describes mood today as "so-so". Pleasant. Decreased tearfulness. Mood symptoms - reports depression, anxiety, and irritability. Reports panic attacks. Reports outbursts. Reports some rumination. Reports obsessive thoughts. Moods are variable - reporting some manic behaviors. Stating "I'm not doing as good as I would like". Willing to consider options to help stabilize her mood. Varying interest and motivation. Taking medications as prescribed.  Energy levels elevated. Active, does not have a regular exercise routine.  Appetite adequate. Weight loss - 252 pounds. Reports increased issues with sleep - getting a few hours a  night. Focus and concentration stable. Diagnosed with ADD in childhood. Completing tasks. Managing aspects of household. Works full-time - Environmental manager - staff supportive.  Denies SI or HI.  Denies AH or VH.  Denies self harm. Denies substance use.   Previous medication trials:  Buspar, Hydroxyzine.  Review of Systems:  Review of Systems  Musculoskeletal:  Negative for gait problem.  Neurological:  Negative for tremors.  Psychiatric/Behavioral:         Please refer to HPI    Medications: I have reviewed the patient's current medications.  Current Outpatient Medications  Medication Sig Dispense Refill   acetaminophen (TYLENOL) 325 MG tablet Take 650 mg by mouth every 6 (six) hours as needed.     albuterol (VENTOLIN HFA) 108 (90 Base) MCG/ACT inhaler Inhale 2 puffs into the lungs every 6 (six) hours as needed for wheezing or shortness of breath.     amphetamine-dextroamphetamine (ADDERALL XR) 20 MG 24 hr capsule Take 1 capsule (20 mg total) by mouth 2 (two) times daily. 60 capsule 0   amphetamine-dextroamphetamine (ADDERALL) 20 MG tablet Take 1 tablet (20 mg total) by mouth 2 (two) times daily. 60 tablet 0   amphetamine-dextroamphetamine (ADDERALL) 20 MG tablet Take 1 tablet (20 mg total) by mouth 2 (two) times daily. 60 tablet 0   amphetamine-dextroamphetamine (ADDERALL) 20 MG tablet Take 1 tablet (20 mg total) by mouth 2 (two) times daily. 60 tablet 0   botulinum toxin Type A (BOTOX) 200 units injection Provider to inject 155 units into the muscles of the head and neck every 12 weeks. Discard remainder. 1 each 1   calcium carbonate (TUMS EX)  750 MG chewable tablet Chew 2 tablets by mouth as needed for heartburn.     DULoxetine (CYMBALTA) 60 MG capsule Take 2 capsules (120 mg total) by mouth daily. 60 capsule 0   gabapentin (NEURONTIN) 600 MG tablet Take 1,200 mg by mouth 3 (three) times daily.     LORazepam (ATIVAN) 0.5 MG tablet Take 1 tablet (0.5 mg total) by mouth daily as  needed for anxiety. 30 tablet 2   Melatonin 10 MG TABS Take 10 mg by mouth daily as needed (sleep).     methylPREDNISolone (MEDROL DOSEPAK) 4 MG TBPK tablet Take pills daily all together with food. Take the first dose (6 pills) as soon as possible. Take the rest each morning. For 6 days total 6-5-4-3-2-1. 21 tablet 1   MOUNJARO 12.5 MG/0.5ML Pen INJECT 12.5 MG INTO SKIN EVERY 7 DAYS     ondansetron (ZOFRAN-ODT) 4 MG disintegrating tablet TAKE 1 TABLET BY MOUTH EVERY 8 HOURS AS NEEDED FOR NAUSEA 30 tablet 3   oxyCODONE-acetaminophen (PERCOCET/ROXICET) 5-325 MG tablet Take 1 tablet by mouth every 6 (six) hours as needed for moderate pain or severe pain. 15 tablet 0   OZEMPIC, 2 MG/DOSE, 8 MG/3ML SOPN Inject into the skin.     promethazine (PHENERGAN) 25 MG tablet Take 1 tablet (25 mg total) by mouth every 6 (six) hours as needed for nausea or vomiting. 60 tablet 2   Rimegepant Sulfate (NURTEC) 75 MG TBDP Take 75 mg by mouth daily as needed. For migraines. Take as close to onset of migraine as possible. One daily maximum. 10 tablet 6   rizatriptan (MAXALT-MLT) 10 MG disintegrating tablet Take 1 tablet (10 mg total) by mouth as needed for migraine. May repeat in 2 hours if needed 9 tablet 11   traZODone (DESYREL) 150 MG tablet Take 1 tablet (150 mg total) by mouth at bedtime as needed for sleep. 30 tablet 2   No current facility-administered medications for this visit.    Medication Side Effects: None  Allergies:  Allergies  Allergen Reactions   Cinnamon Anaphylaxis    (NO "REAL"/TREE BARK CINNAMON)   Clindamycin Nausea And Vomiting    Other reaction(s): gi distress Chest pain, trouble swallowing Chest pain, trouble swallowing    Coconut (Cocos Nucifera) Anaphylaxis    Can eat "fake" coconut   Lovenox [Enoxaparin] Other (See Comments)    BROKE DOWN THE SKIN AT INJECTION SITE AND CAUSED A WOUND   Nystatin Anaphylaxis   Aspartame     Other reaction(s): Other (See Comments) "Breathing  Difficulty, Serious intense migraines".  "Breathing Difficulty, Serious intense migraines".     Niacin Hives   Nitrofurantoin Hives   Other     "Headache cocktail"   Blister to abd   Pregabalin     Other reaction(s): Other (See Comments) Depression with suicidal thoughts Depression with suicidal thoughts     Past Medical History:  Diagnosis Date   Anxiety    Chronic back pain    Depression    Diabetes (HCC)    Drug-seeking behavior    Essential hypertension    GERD (gastroesophageal reflux disease)    Hypothyroidism    Kidney stone    Lumbar radiculopathy, right    Migraine headache    PTSD (post-traumatic stress disorder)     Family History  Problem Relation Age of Onset   Diabetes Mellitus II Mother    Hypertension Mother    Seizures Mother    Other Mother  post stroke headaches   Stroke Mother    Hypertension Father    Diabetes Mellitus II Father    Hyperlipidemia Father    Bipolar disorder Sister    Asthma Son    Autism Son    Tourette syndrome Son    Autism Daughter        level 3   Other Daughter        tibia torsion & femoral aniversion    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    Highest education level: Not on file  Occupational History   Not on file  Tobacco Use   Smoking status: Former    Packs/day: 2.00    Years: 3.00    Additional pack years: 0.00    Total pack years: 6.00    Types: Cigarettes    Quit date: 2006    Years since quitting: 18.2   Smokeless tobacco: Never  Vaping Use   Vaping Use: Never used  Substance and Sexual Activity   Alcohol use: Yes    Comment: socially   Drug use: No   Sexual activity: Yes    Birth control/protection: None  Other Topics Concern   Not on file  Social History Narrative   Lives at home with husband and kids   Right handed   Caffeine: 6 cups daily   Social Determinants  of Health   Financial Resource Strain: Not on file  Food Insecurity: Not on file  Transportation Needs: Not on file  Physical Activity: Not on file  Stress: Not on file  Social Connections: Not on file  Intimate Partner Violence: Not on file    Past Medical History, Surgical history, Social history, and Family history were reviewed and updated as appropriate.   Please see review of systems for further details on the patient's review from today.   Objective:   Physical Exam:  There were no vitals taken for this visit.  Physical Exam Constitutional:      General: She is not in acute distress. Musculoskeletal:        General: No deformity.  Neurological:     Mental Status: She is alert and oriented to person, place, and time.     Coordination: Coordination normal.  Psychiatric:        Attention and Perception: Attention and perception normal. She does not perceive auditory or visual hallucinations.        Mood and Affect: Mood normal. Mood is not anxious or depressed. Affect is not labile, blunt, angry or inappropriate.        Speech: Speech normal.        Behavior: Behavior normal.        Thought Content: Thought content normal. Thought content is not paranoid or delusional. Thought content does not include homicidal or suicidal ideation. Thought content does not include homicidal or suicidal plan.        Cognition and Memory: Cognition and memory normal.        Judgment: Judgment normal.     Comments: Insight intact     Lab Review:     Component Value Date/Time   NA 139 05/16/2022 1654   K 3.5 05/16/2022 1654   CL 104 05/16/2022 1654   CO2 26 05/16/2022 1654   GLUCOSE 97 05/16/2022 1654   BUN 14 05/16/2022 1654   CREATININE  0.73 05/16/2022 1654   CALCIUM 8.8 (L) 05/16/2022 1654   PROT 7.0 05/03/2021 1750   ALBUMIN 3.6 05/03/2021 1750   AST 13 (L) 05/03/2021 1750   ALT 13 05/03/2021 1750   ALKPHOS 76 05/03/2021 1750   BILITOT 0.4 05/03/2021 1750   GFRNONAA >60  05/16/2022 1654   GFRAA >60 02/21/2020 1033       Component Value Date/Time   WBC 11.1 (H) 05/16/2022 1654   RBC 4.80 05/16/2022 1654   HGB 14.1 05/16/2022 1654   HCT 41.7 05/16/2022 1654   PLT 415 (H) 05/16/2022 1654   MCV 86.9 05/16/2022 1654   MCH 29.4 05/16/2022 1654   MCHC 33.8 05/16/2022 1654   RDW 15.4 05/16/2022 1654   LYMPHSABS 4.2 (H) 05/16/2022 1654   MONOABS 0.4 05/16/2022 1654   EOSABS 0.1 05/16/2022 1654   BASOSABS 0.0 05/16/2022 1654    No results found for: "POCLITH", "LITHIUM"   No results found for: "PHENYTOIN", "PHENOBARB", "VALPROATE", "CBMZ"   .res Assessment: Plan:    Plan:  PDMP reviewed  Add Seroquel 50mg  at hs for sleep  D/CAdderall 20mg  BID Add Adderall XR 30mg   Cymbalta 60mg  - 2 daily Trazadone 150mg  at bedtime - takes 50mg  to 150 - typically 75mg  at hs Lorazepam 0.5mg  daily prn panic attacks - does not take every day.  Patient to call and check in for the next 2 days regarding sleep.    Monitor BP between visits while taking stimulant medication.   Time spent with patient was 25 minutes. Greater than 50% of face to face time with patient was spent on counseling and coordination of care.    RTC 3 months  Patient advised to contact office with any questions, adverse effects, or acute worsening in signs and symptoms.  Discussed potential benefits, risk, and side effects of benzodiazepines to include potential risk of tolerance and dependence, as well as possible drowsiness.  Advised patient not to drive if experiencing drowsiness and to take lowest possible effective dose to minimize risk of dependence and tolerance.   Discussed potential metabolic side effects associated with atypical antipsychotics, as well as potential risk for movement side effects. Advised pt to contact office if movement side effects occur.    There are no diagnoses linked to this encounter.   Please see After Visit Summary for patient specific  instructions.  Future Appointments  Date Time Provider Department Center  09/28/2022  1:20 PM Savahanna Almendariz, Thereasa Solo, NP [facility] None  10/11/2022  4:00 PM Anson Fret, MD [facility] None    No orders of the defined types were placed in this encounter.     -------------------------------

## 2022-10-03 ENCOUNTER — Other Ambulatory Visit: Payer: Self-pay

## 2022-10-03 ENCOUNTER — Other Ambulatory Visit (HOSPITAL_COMMUNITY): Payer: Self-pay

## 2022-10-03 MED ORDER — BOTOX 200 UNITS IJ SOLR
INTRAMUSCULAR | 3 refills | Status: DC
  Filled 2022-10-03: qty 1, 84d supply, fill #0
  Filled 2023-05-03: qty 1, 84d supply, fill #1
  Filled 2023-07-25: qty 1, 84d supply, fill #2

## 2022-10-03 NOTE — Addendum Note (Signed)
Addended by: Christophe Louis E on: 10/03/2022 02:24 PM   Modules accepted: Orders

## 2022-10-03 NOTE — Telephone Encounter (Signed)
Rx sent to [facility].

## 2022-10-03 NOTE — Telephone Encounter (Signed)
Can you send refill to [facility] for Botox please?

## 2022-10-11 ENCOUNTER — Ambulatory Visit: Payer: BC Managed Care – PPO | Admitting: Neurology

## 2022-10-17 ENCOUNTER — Emergency Department (HOSPITAL_COMMUNITY): Payer: BC Managed Care – PPO

## 2022-10-17 ENCOUNTER — Encounter (HOSPITAL_COMMUNITY): Payer: Self-pay | Admitting: *Deleted

## 2022-10-17 ENCOUNTER — Other Ambulatory Visit: Payer: Self-pay

## 2022-10-17 ENCOUNTER — Emergency Department (HOSPITAL_COMMUNITY)
Admission: EM | Admit: 2022-10-17 | Discharge: 2022-10-17 | Disposition: A | Discharge status: Home / Self Care | Payer: BC Managed Care – PPO | Attending: Emergency Medicine | Admitting: Emergency Medicine

## 2022-10-17 DIAGNOSIS — Z79899 Other long term (current) drug therapy: Secondary | ICD-10-CM | POA: Insufficient documentation

## 2022-10-17 DIAGNOSIS — D72829 Elevated white blood cell count, unspecified: Secondary | ICD-10-CM | POA: Diagnosis not present

## 2022-10-17 DIAGNOSIS — I10 Essential (primary) hypertension: Secondary | ICD-10-CM | POA: Diagnosis not present

## 2022-10-17 DIAGNOSIS — G8929 Other chronic pain: Secondary | ICD-10-CM | POA: Insufficient documentation

## 2022-10-17 DIAGNOSIS — M5441 Lumbago with sciatica, right side: Secondary | ICD-10-CM

## 2022-10-17 DIAGNOSIS — E119 Type 2 diabetes mellitus without complications: Secondary | ICD-10-CM | POA: Diagnosis not present

## 2022-10-17 DIAGNOSIS — M545 Low back pain, unspecified: Secondary | ICD-10-CM | POA: Diagnosis present

## 2022-10-17 LAB — CBC WITH DIFFERENTIAL/PLATELET
Abs Immature Granulocytes: 0.02 10*3/uL (ref 0.00–0.07)
Basophils Absolute: 0.1 10*3/uL (ref 0.0–0.1)
Basophils Relative: 1 %
Eosinophils Absolute: 0.2 10*3/uL (ref 0.0–0.5)
Eosinophils Relative: 3 %
HCT: 39.8 % (ref 36.0–46.0)
Hemoglobin: 13 g/dL (ref 12.0–15.0)
Immature Granulocytes: 0 %
Lymphocytes Relative: 48 %
Lymphs Abs: 4.1 10*3/uL — ABNORMAL HIGH (ref 0.7–4.0)
MCH: 29.6 pg (ref 26.0–34.0)
MCHC: 32.7 g/dL (ref 30.0–36.0)
MCV: 90.7 fL (ref 80.0–100.0)
Monocytes Absolute: 0.4 10*3/uL (ref 0.1–1.0)
Monocytes Relative: 5 %
Neutro Abs: 3.6 10*3/uL (ref 1.7–7.7)
Neutrophils Relative %: 43 %
Platelets: 347 10*3/uL (ref 150–400)
RBC: 4.39 MIL/uL (ref 3.87–5.11)
RDW: 13.1 % (ref 11.5–15.5)
WBC: 8.3 10*3/uL (ref 4.0–10.5)
nRBC: 0 % (ref 0.0–0.2)

## 2022-10-17 LAB — URINALYSIS, ROUTINE W REFLEX MICROSCOPIC
Glucose, UA: NEGATIVE mg/dL
Hgb urine dipstick: NEGATIVE
Ketones, ur: 5 mg/dL — AB
Nitrite: NEGATIVE
Protein, ur: 30 mg/dL — AB
Specific Gravity, Urine: 1.028 (ref 1.005–1.030)
pH: 5 (ref 5.0–8.0)

## 2022-10-17 LAB — BASIC METABOLIC PANEL
Anion gap: 8 (ref 5–15)
BUN: 7 mg/dL (ref 6–20)
CO2: 28 mmol/L (ref 22–32)
Calcium: 8.8 mg/dL — ABNORMAL LOW (ref 8.9–10.3)
Chloride: 100 mmol/L (ref 98–111)
Creatinine, Ser: 0.69 mg/dL (ref 0.44–1.00)
GFR, Estimated: >60 mL/min (ref >60)
Glucose, Bld: 105 mg/dL — ABNORMAL HIGH (ref 70–99)
Potassium: 4.1 mmol/L (ref 3.5–5.1)
Sodium: 136 mmol/L (ref 135–145)

## 2022-10-17 LAB — PREGNANCY, URINE: Preg Test, Ur: NEGATIVE

## 2022-10-17 MED ORDER — DEXAMETHASONE SODIUM PHOSPHATE 10 MG/ML IJ SOLN
10.0000 mg | Freq: Once | INTRAMUSCULAR | Status: AC
  Administered 2022-10-17: 10 mg via INTRAMUSCULAR
  Filled 2022-10-17: qty 1

## 2022-10-17 MED ORDER — METHYLPREDNISOLONE 4 MG PO TBPK: ORAL_TABLET | ORAL | 0 refills | Status: DC

## 2022-10-17 MED ORDER — FENTANYL CITRATE PF 50 MCG/ML IJ SOSY
50.0000 ug | PREFILLED_SYRINGE | Freq: Once | INTRAMUSCULAR | Status: AC
  Administered 2022-10-17: 50 ug via INTRAMUSCULAR
  Filled 2022-10-17: qty 1

## 2022-10-17 MED ORDER — LORAZEPAM 0.5 MG PO TABS: 0.5000 mg | ORAL_TABLET | Freq: Once | ORAL | Status: DC | PRN

## 2022-10-17 NOTE — ED Triage Notes (Addendum)
Pt with lower back pain that shooting pains down right leg, pt states she is having trouble urinating, feels like she needs to force it out. Hx of back surgeries (ablation per), last one was last week.

## 2022-10-17 NOTE — ED Provider Notes (Signed)
EMERGENCY DEPARTMENT AT [facility] Provider Note   CSN: [id number] Arrival date & time: 10/17/22  1433     History  Chief Complaint  Patient presents with   Back Pain    Sonya Gibson is a 37 y.o. female.  Patient presents the emergency department complaining of low back pain, chronic in nature but significantly worse this morning.  Patient states that she has multiple issues with her lower back including recent nerve ablations earlier this month.  She states that her neurosurgeon recommended she come to the emergency department for pain control and possible imaging.  The patient states that earlier today she was bending and felt a sudden pop in her low back and since then has been having significant pain with radiation down the right leg, and difficulty urinating.  She states she took her home medication which includes chronic pain medications and gabapentin.  She denies saddle anesthesia.  Past medical history significant for lumbar radiculopathy, right-sided, diabetes, kidney stones, drug-seeking behavior, chronic back pain, hypertension, PTSD, GERD, anxiety, depression  HPI     Home Medications Prior to Admission medications   Medication Sig Start Date End Date Taking? Authorizing Provider  methylPREDNISolone (MEDROL DOSEPAK) 4 MG TBPK tablet Take as directed per package instructions 10/17/22  Yes Darrick Grinder, PA-C  acetaminophen (TYLENOL) 325 MG tablet Take 650 mg by mouth every 6 (six) hours as needed.    [provider]  albuterol (VENTOLIN HFA) 108 (90 Base) MCG/ACT inhaler Inhale 2 puffs into the lungs every 6 (six) hours as needed for wheezing or shortness of breath.    [provider]  amphetamine-dextroamphetamine (ADDERALL XR) 20 MG 24 hr capsule Take 1 capsule (20 mg total) by mouth 2 (two) times daily. 05/03/22   Mozingo, Thereasa Solo, NP  amphetamine-dextroamphetamine (ADDERALL XR) 30 MG 24 hr capsule Take 1 capsule (30 mg  total) by mouth daily. 09/28/22   Mozingo, Thereasa Solo, NP  amphetamine-dextroamphetamine (ADDERALL XR) 30 MG 24 hr capsule Take 1 capsule (30 mg total) by mouth daily. 10/26/22   Mozingo, Thereasa Solo, NP  amphetamine-dextroamphetamine (ADDERALL XR) 30 MG 24 hr capsule Take 1 capsule (30 mg total) by mouth daily. 11/23/22   Mozingo, Thereasa Solo, NP  amphetamine-dextroamphetamine (ADDERALL) 20 MG tablet Take 1 tablet (20 mg total) by mouth 2 (two) times daily. 05/05/22   Mozingo, Thereasa Solo, NP  amphetamine-dextroamphetamine (ADDERALL) 20 MG tablet Take 1 tablet (20 mg total) by mouth 2 (two) times daily. 06/02/22   Mozingo, Thereasa Solo, NP  amphetamine-dextroamphetamine (ADDERALL) 20 MG tablet Take 1 tablet (20 mg total) by mouth 2 (two) times daily. 09/06/22   Mozingo, Thereasa Solo, NP  botulinum toxin Type A (BOTOX) 200 units injection Provider to inject 155 units into the muscles of the head and neck every 12 weeks. Discard remainder. 10/03/22   Anson Fret, MD  calcium carbonate (TUMS EX) 750 MG chewable tablet Chew 2 tablets by mouth as needed for heartburn.    [provider]  DULoxetine (CYMBALTA) 60 MG capsule Take 2 capsules (120 mg total) by mouth daily. 09/28/22   Mozingo, Thereasa Solo, NP  gabapentin (NEURONTIN) 600 MG tablet Take 1,200 mg by mouth 3 (three) times daily.    [provider]  LORazepam (ATIVAN) 0.5 MG tablet Take 1 tablet (0.5 mg total) by mouth daily as needed for anxiety. 09/28/22   Mozingo, Thereasa Solo, NP  Melatonin 10 MG TABS Take 10 mg by mouth daily  as needed (sleep).    [provider]  MOUNJARO 12.5 MG/0.5ML Pen INJECT 12.5 MG INTO SKIN EVERY 7 DAYS 04/25/22   [provider]  ondansetron (ZOFRAN-ODT) 4 MG disintegrating tablet TAKE 1 TABLET BY MOUTH EVERY 8 HOURS AS NEEDED FOR NAUSEA 12/29/21   Anson Fret, MD  oxyCODONE-acetaminophen (PERCOCET/ROXICET) 5-325 MG tablet Take 1 tablet by mouth every 6  (six) hours as needed for moderate pain or severe pain. 06/04/20   Vanetta Mulders, MD  OZEMPIC, 2 MG/DOSE, 8 MG/3ML SOPN Inject into the skin.    [provider]  promethazine (PHENERGAN) 25 MG tablet Take 1 tablet (25 mg total) by mouth every 6 (six) hours as needed for nausea or vomiting. 07/06/18   Anson Fret, MD  QUEtiapine (SEROQUEL) 50 MG tablet Take 1 tablet (50 mg total) by mouth at bedtime. 09/28/22   Mozingo, Thereasa Solo, NP  Rimegepant Sulfate (NURTEC) 75 MG TBDP Take 75 mg by mouth daily as needed. For migraines. Take as close to onset of migraine as possible. One daily maximum. 05/09/22   Anson Fret, MD  rizatriptan (MAXALT-MLT) 10 MG disintegrating tablet Take 1 tablet (10 mg total) by mouth as needed for migraine. May repeat in 2 hours if needed 05/12/21   Anson Fret, MD  traZODone (DESYREL) 150 MG tablet Take 1 tablet (150 mg total) by mouth at bedtime as needed for sleep. 09/28/22   Mozingo, Thereasa Solo, NP  levETIRAcetam (KEPPRA) 500 MG tablet Take 1 tablet (500 mg total) by mouth 2 (two) times daily. 07/10/18 07/12/19  Catarina Hartshorn, MD  metoprolol tartrate (LOPRESSOR) 25 MG tablet Take 0.5 tablets (12.5 mg total) by mouth 2 (two) times daily. 07/10/18 06/02/19  Catarina Hartshorn, MD  venlafaxine XR (EFFEXOR-XR) 75 MG 24 hr capsule Take 75 mg by mouth at bedtime.  12/12/17 06/02/19  [provider]      Allergies    Cinnamon, Clindamycin, Coconut (cocos nucifera), Lovenox [enoxaparin], Nystatin, Aspartame, Niacin, Nitrofurantoin, Other, and Pregabalin    Review of Systems   Review of Systems  Physical Exam Updated Vital Signs BP 108/72 (BP Location: Left Arm)   Pulse 87   Temp 98.3 F (36.8 C) (Oral)   Resp 18   Ht 5\' 1"  (1.549 m)   Wt 117.5 kg   LMP 10/03/2022 (Approximate)   SpO2 98%   BMI 48.94 kg/m  Physical Exam Vitals and nursing note reviewed.  Constitutional:      General: She is not in acute distress.    Appearance: She is  well-developed.  HENT:     Head: Normocephalic and atraumatic.  Eyes:     Conjunctiva/sclera: Conjunctivae normal.  Cardiovascular:     Rate and Rhythm: Normal rate and regular rhythm.     Heart sounds: No murmur heard. Pulmonary:     Effort: Pulmonary effort is normal. No respiratory distress.     Breath sounds: Normal breath sounds.  Abdominal:     Palpations: Abdomen is soft.     Tenderness: There is no abdominal tenderness.  Musculoskeletal:        General: No swelling or tenderness.     Cervical back: Neck supple.     Comments: Positive SLR, right  Skin:    General: Skin is warm and dry.     Capillary Refill: Capillary refill takes less than 2 seconds.  Neurological:     General: No focal deficit present.     Mental Status: She is alert.  Motor: No weakness.     Comments: No weakness on physical exam.    Psychiatric:        Mood and Affect: Mood normal.     ED Results / Procedures / Treatments   Labs (all labs ordered are listed, but only abnormal results are displayed) Labs Reviewed  BASIC METABOLIC PANEL - Abnormal; Notable for the following components:      Result Value   Glucose, Bld 105 (*)    Calcium 8.8 (*)    All other components within normal limits  CBC WITH DIFFERENTIAL/PLATELET - Abnormal; Notable for the following components:   Lymphs Abs 4.1 (*)    All other components within normal limits  URINALYSIS, ROUTINE W REFLEX MICROSCOPIC - Abnormal; Notable for the following components:   Color, Urine AMBER (*)    APPearance CLOUDY (*)    Bilirubin Urine SMALL (*)    Ketones, ur 5 (*)    Protein, ur 30 (*)    Leukocytes,Ua TRACE (*)    Bacteria, UA RARE (*)    All other components within normal limits  PREGNANCY, URINE    EKG None  Radiology MR LUMBAR SPINE WO CONTRAST  Result Date: 10/17/2022 CLINICAL DATA:  Low back pain, cauda equina syndrome suspected EXAM: MRI LUMBAR SPINE WITHOUT CONTRAST TECHNIQUE: Multiplanar, multisequence MR  imaging of the lumbar spine was performed. No intravenous contrast was administered. COMPARISON:  05/16/2022 FINDINGS: Segmentation:  Standard. Alignment:  Very slight dextrocurvature.  No significant listhesis. Vertebrae:  No fracture, evidence of discitis, or bone lesion. Conus medullaris and cauda equina: Conus extends to the T12-L1 level. Conus appears normal. Fatty filum terminalis. Paraspinal and other soft tissues: Negative. Disc levels: T12-L1 through L2-L3: No significant disc protrusion, foraminal stenosis, or canal stenosis. Unchanged. L3-L4: Minimal annular disc bulge. Bilateral facet arthropathy and slight ligamentum flavum buckling. Borderline-mild canal stenosis with mild bilateral foraminal stenosis. Unchanged. L4-L5: Prior posterior decompression. Annular disc bulge with right paracentral protrusion. Mild bilateral facet arthropathy. Mild right subarticular recess stenosis without canal stenosis. Mild right foraminal stenosis. Unchanged. L5-S1: Shallow left paracentral disc protrusion. Mild bilateral facet arthropathy. No foraminal or canal stenosis. Unchanged. IMPRESSION: 1. No significant interval change compared to prior MRI 05/16/2022. 2. Borderline-mild canal stenosis and mild bilateral foraminal stenosis at L3-L4. 3. Mild right subarticular recess and foraminal stenosis at L4-L5. Electronically Signed   By: Duanne Guess D.O.   On: 10/17/2022 18:33    Procedures Procedures    Medications Ordered in ED Medications  LORazepam (ATIVAN) tablet 0.5 mg (has no administration in time range)  dexamethasone (DECADRON) injection 10 mg (10 mg Intramuscular Given 10/17/22 2019)  fentaNYL (SUBLIMAZE) injection 50 mcg (50 mcg Intramuscular Given 10/17/22 2019)    ED Course/ Medical Decision Making/ A&P                             Medical Decision Making Amount and/or Complexity of Data Reviewed Labs: ordered. Radiology: ordered.  Risk Prescription drug management.   Patient  presents with a chief complaint of back pain.  Differential diagnosis includes fracture, dislocation, herniated disc, cauda equina, others  The patient is comorbidities including chronic low back pain.  I ordered and reviewed labs.  Pertinent results include negative pregnancy test, grossly unremarkable BMP, CBC.  UA with trace leukocytes, rare bacteria.  I ordered and interpreted imaging for MRI of the lumbar spine. 1. No significant interval change compared to prior MRI 05/16/2022.  2. Borderline-mild canal stenosis and mild bilateral foraminal  stenosis at L3-L4.  3. Mild right subarticular recess and foraminal stenosis at L4-L5.  I agree with the radiologist's findings  I ordered the patient Decadron for inflammation, fentanyl for pain.  Upon reassessment the patient was feeling better  Patient with no significant changes on her MRI from previous studies.  No cauda equina.  Back pain under better control after pain medication.  Patient takes multiple chronic pain medications and I see no benefit to adding anything from the emergency side as an outpatient prescription other than a Medrol Dosepak.  Patient states these have helped significantly in the past.  She will follow-up with her spine specialist for further evaluation and management.       Final Clinical Impression(s) / ED Diagnoses Final diagnoses:  Midline low back pain with right-sided sciatica, unspecified chronicity    Rx / DC Orders ED Discharge Orders          Ordered    methylPREDNISolone (MEDROL DOSEPAK) 4 MG TBPK tablet        10/17/22 2126              Pamala Duffel 10/17/22 2129    Pricilla Loveless, MD 10/21/22 2115

## 2022-10-17 NOTE — ED Provider Triage Note (Signed)
Emergency Medicine Provider Triage Evaluation Note  Sonya Gibson , a 37 y.o. female  was evaluated in triage.  Pt complains of low back pain with significant worsening this morning.  Patient with multiple low back issues, recent nerve ablations earlier this month.  Patient states that her neurosurgeon recommended she come to the emergency department for pain control and possible imaging.  Patient states that earlier today she was bending and felt a sudden pop in her low back.  She states that she is having difficulty urinating since the pop in her spine.  She also has pain radiating down her right leg.  She states she took her home medication which includes gabapentin, and oxycodone with no relief.  Review of Systems  Positive: As above Negative: As above  Physical Exam  BP 124/88 (BP Location: Right Arm)   Pulse (!) 119   Temp 98.3 F (36.8 C) (Oral)   Resp 17   Ht 5\' 1"  (1.549 m)   Wt 117.5 kg   LMP 10/03/2022 (Approximate)   SpO2 100%   BMI 48.94 kg/m  Gen:   Awake, no distress   Resp:  Normal effort  MSK:   Moves extremities without difficulty  Other:    Medical Decision Making  Medically screening exam initiated at 5:06 PM.  Appropriate orders placed.  Sonya Gibson was informed that the remainder of the evaluation will be completed by another provider, this initial triage assessment does not replace that evaluation, and the importance of remaining in the ED until their evaluation is complete.     Darrick Grinder, PA-C 10/17/22 1707

## 2022-10-17 NOTE — Discharge Instructions (Addendum)
You were evaluated today for low back pain.  Your MRI was reassuring for no acute changes from your previous MRIs.  I have prescribed a steroid Dosepak.  Please take as prescribed.  Follow-up with pain management and your spinal specialist for further evaluation and management

## 2022-11-08 ENCOUNTER — Other Ambulatory Visit: Payer: Self-pay | Admitting: Neurology

## 2022-11-09 NOTE — Telephone Encounter (Signed)
Allergy indicated routing to provider to review:

## 2022-11-16 ENCOUNTER — Other Ambulatory Visit (HOSPITAL_COMMUNITY): Payer: Self-pay

## 2022-11-25 ENCOUNTER — Other Ambulatory Visit (HOSPITAL_COMMUNITY): Payer: Self-pay

## 2022-12-01 ENCOUNTER — Ambulatory Visit: Payer: BC Managed Care – PPO | Admitting: Family Medicine

## 2022-12-05 ENCOUNTER — Telehealth: Payer: Self-pay | Admitting: Adult Health

## 2022-12-05 NOTE — Telephone Encounter (Signed)
Pharmacy sent PA Request for Adderall XR 30mg  , See CMM

## 2022-12-07 ENCOUNTER — Other Ambulatory Visit: Payer: Self-pay

## 2022-12-08 ENCOUNTER — Other Ambulatory Visit (HOSPITAL_COMMUNITY): Payer: Self-pay

## 2022-12-08 NOTE — Telephone Encounter (Signed)
Prior Approval received for ADDERALL XR 30 MG effective 12/08/2022-12/08/2023 with [facility] PA# [id number]

## 2022-12-23 ENCOUNTER — Other Ambulatory Visit: Payer: Self-pay | Admitting: Adult Health

## 2022-12-23 DIAGNOSIS — F410 Panic disorder [episodic paroxysmal anxiety] without agoraphobia: Secondary | ICD-10-CM

## 2022-12-23 DIAGNOSIS — G4700 Insomnia, unspecified: Secondary | ICD-10-CM

## 2022-12-23 NOTE — Telephone Encounter (Signed)
Due 7/13

## 2022-12-28 ENCOUNTER — Other Ambulatory Visit: Payer: Self-pay | Admitting: Neurology

## 2022-12-28 ENCOUNTER — Other Ambulatory Visit: Payer: Self-pay | Admitting: Adult Health

## 2022-12-28 DIAGNOSIS — F902 Attention-deficit hyperactivity disorder, combined type: Secondary | ICD-10-CM

## 2022-12-28 NOTE — Telephone Encounter (Signed)
Due 7/13

## 2022-12-30 NOTE — Telephone Encounter (Signed)
Please call to schedule an appt, due this month.  

## 2023-01-04 ENCOUNTER — Ambulatory Visit: Payer: BC Managed Care – PPO | Admitting: Family Medicine

## 2023-01-10 ENCOUNTER — Emergency Department (HOSPITAL_COMMUNITY)
Admission: EM | Admit: 2023-01-10 | Discharge: 2023-01-10 | Disposition: A | Discharge status: Home / Self Care | Payer: BC Managed Care – PPO | Attending: Emergency Medicine | Admitting: Emergency Medicine

## 2023-01-10 ENCOUNTER — Emergency Department (HOSPITAL_COMMUNITY): Payer: BC Managed Care – PPO

## 2023-01-10 ENCOUNTER — Other Ambulatory Visit: Payer: Self-pay

## 2023-01-10 ENCOUNTER — Encounter (HOSPITAL_COMMUNITY): Payer: Self-pay | Admitting: Emergency Medicine

## 2023-01-10 DIAGNOSIS — Z79899 Other long term (current) drug therapy: Secondary | ICD-10-CM | POA: Insufficient documentation

## 2023-01-10 DIAGNOSIS — R197 Diarrhea, unspecified: Secondary | ICD-10-CM | POA: Diagnosis not present

## 2023-01-10 DIAGNOSIS — E039 Hypothyroidism, unspecified: Secondary | ICD-10-CM | POA: Insufficient documentation

## 2023-01-10 DIAGNOSIS — Z720 Tobacco use: Secondary | ICD-10-CM | POA: Diagnosis not present

## 2023-01-10 DIAGNOSIS — R112 Nausea with vomiting, unspecified: Secondary | ICD-10-CM | POA: Diagnosis present

## 2023-01-10 DIAGNOSIS — E119 Type 2 diabetes mellitus without complications: Secondary | ICD-10-CM | POA: Diagnosis not present

## 2023-01-10 DIAGNOSIS — R109 Unspecified abdominal pain: Secondary | ICD-10-CM | POA: Insufficient documentation

## 2023-01-10 LAB — COMPREHENSIVE METABOLIC PANEL
ALT: 10 U/L (ref 0–44)
AST: 13 U/L — ABNORMAL LOW (ref 15–41)
Albumin: 4.1 g/dL (ref 3.5–5.0)
Alkaline Phosphatase: 69 U/L (ref 38–126)
Anion gap: 9 (ref 5–15)
BUN: 8 mg/dL (ref 6–20)
CO2: 28 mmol/L (ref 22–32)
Calcium: 9 mg/dL (ref 8.9–10.3)
Chloride: 103 mmol/L (ref 98–111)
Creatinine, Ser: 0.81 mg/dL (ref 0.44–1.00)
GFR, Estimated: >60 mL/min (ref >60)
Glucose, Bld: 87 mg/dL (ref 70–99)
Potassium: 3.9 mmol/L (ref 3.5–5.1)
Sodium: 140 mmol/L (ref 135–145)
Total Bilirubin: 0.4 mg/dL (ref 0.3–1.2)
Total Protein: 8.1 g/dL (ref 6.5–8.1)

## 2023-01-10 LAB — CBC
HCT: 42.9 % (ref 36.0–46.0)
Hemoglobin: 14 g/dL (ref 12.0–15.0)
MCH: 29.1 pg (ref 26.0–34.0)
MCHC: 32.6 g/dL (ref 30.0–36.0)
MCV: 89.2 fL (ref 80.0–100.0)
Platelets: 413 10*3/uL — ABNORMAL HIGH (ref 150–400)
RBC: 4.81 MIL/uL (ref 3.87–5.11)
RDW: 13.5 % (ref 11.5–15.5)
WBC: 8.6 10*3/uL (ref 4.0–10.5)
nRBC: 0 % (ref 0.0–0.2)

## 2023-01-10 LAB — URINALYSIS, ROUTINE W REFLEX MICROSCOPIC
Bacteria, UA: NONE SEEN
Bilirubin Urine: NEGATIVE
Glucose, UA: NEGATIVE mg/dL
Hgb urine dipstick: NEGATIVE
Ketones, ur: NEGATIVE mg/dL
Leukocytes,Ua: NEGATIVE
Nitrite: NEGATIVE
Protein, ur: 30 mg/dL — AB
Specific Gravity, Urine: 1.023 (ref 1.005–1.030)
pH: 6 (ref 5.0–8.0)

## 2023-01-10 LAB — LIPASE, BLOOD: Lipase: 39 U/L (ref 11–51)

## 2023-01-10 LAB — POC URINE PREG, ED: Preg Test, Ur: NEGATIVE

## 2023-01-10 MED ORDER — DICYCLOMINE HCL 10 MG/ML IM SOLN
20.0000 mg | Freq: Once | INTRAMUSCULAR | Status: AC
  Administered 2023-01-10: 20 mg via INTRAMUSCULAR
  Filled 2023-01-10: qty 2

## 2023-01-10 MED ORDER — SODIUM CHLORIDE 0.9 % IV BOLUS
1000.0000 mL | Freq: Once | INTRAVENOUS | Status: AC
  Administered 2023-01-10: 1000 mL via INTRAVENOUS

## 2023-01-10 MED ORDER — PROMETHAZINE HCL 25 MG/ML IJ SOLN
INTRAMUSCULAR | Status: AC
  Filled 2023-01-10: qty 1

## 2023-01-10 MED ORDER — PROMETHAZINE HCL 25 MG PO TABS: 25.0000 mg | ORAL_TABLET | Freq: Four times a day (QID) | ORAL | 0 refills | Status: AC | PRN

## 2023-01-10 MED ORDER — HYDROMORPHONE HCL 1 MG/ML IJ SOLN
1.0000 mg | Freq: Once | INTRAMUSCULAR | Status: AC
  Administered 2023-01-10: 1 mg via INTRAVENOUS
  Filled 2023-01-10: qty 1

## 2023-01-10 MED ORDER — SODIUM CHLORIDE 0.9 % IV SOLN
12.5000 mg | Freq: Once | INTRAVENOUS | Status: AC
  Administered 2023-01-10: 12.5 mg via INTRAVENOUS
  Filled 2023-01-10: qty 0.5

## 2023-01-10 MED ORDER — ONDANSETRON HCL 4 MG/2ML IJ SOLN
4.0000 mg | Freq: Once | INTRAMUSCULAR | Status: AC | PRN
  Administered 2023-01-10: 4 mg via INTRAVENOUS
  Filled 2023-01-10: qty 2

## 2023-01-10 MED ORDER — DICYCLOMINE HCL 20 MG PO TABS: 20.0000 mg | ORAL_TABLET | Freq: Two times a day (BID) | ORAL | 0 refills | Status: DC

## 2023-01-10 MED ORDER — IOHEXOL 300 MG/ML  SOLN
100.0000 mL | Freq: Once | INTRAMUSCULAR | Status: AC | PRN
  Administered 2023-01-10: 100 mL via INTRAVENOUS

## 2023-01-10 NOTE — Discharge Instructions (Signed)
As discussed, your imaging and labs do not show any signs of pancreatitis, gallbladder disease, or any other acute processes.  I have attached information for a GI provider.  You can call and schedule an appointment with him in 1 to 2 days for further evaluation if pain persist.  I have sent both Bentyl and Phenergan to your pharmacy you can take this medication as needed for pain.  Get help right away if: You have chest pain. Your heart is beating very quickly. You have trouble breathing or you are breathing very quickly. You feel very weak or you faint.

## 2023-01-10 NOTE — ED Notes (Signed)
AC called to bring pt's medication from the main pharmacy. AC will bring asap

## 2023-01-10 NOTE — ED Provider Notes (Signed)
[facility] EMERGENCY DEPARTMENT AT [facility] Provider Note   CSN: [id number] Arrival date & time: 01/10/23  1550     History  Chief Complaint  Patient presents with   Abdominal Pain   Emesis    Sonya Gibson is a 37 y.o. female with a history of diabetes mellitus, hypothyroidism, and lumbar radiculopathy who presents to the ED today for abdominal pain. Patient reports pain to the right upper quadrant and epigastrium for the past 3 days that radiates to her shoulder. She reports nausea with vomiting and diarrhea after eating or drinking. The vomit and stool are both the same green color and the stool also seems greasy on appearance.     Home Medications Prior to Admission medications   Medication Sig Start Date End Date Taking? Authorizing Provider  dicyclomine (BENTYL) 20 MG tablet Take 1 tablet (20 mg total) by mouth 2 (two) times daily. 01/10/23 02/09/23 Yes Maxwell Marion, PA-C  promethazine (PHENERGAN) 25 MG tablet Take 1 tablet (25 mg total) by mouth every 6 (six) hours as needed for nausea or vomiting. 01/10/23 02/09/23 Yes Maxwell Marion, PA-C  acetaminophen (TYLENOL) 325 MG tablet Take 650 mg by mouth every 6 (six) hours as needed.    [provider]  albuterol (VENTOLIN HFA) 108 (90 Base) MCG/ACT inhaler Inhale 2 puffs into the lungs every 6 (six) hours as needed for wheezing or shortness of breath.    [provider]  amphetamine-dextroamphetamine (ADDERALL XR) 20 MG 24 hr capsule Take 1 capsule (20 mg total) by mouth 2 (two) times daily. 05/03/22   Mozingo, Thereasa Solo, NP  amphetamine-dextroamphetamine (ADDERALL XR) 30 MG 24 hr capsule Take 1 capsule (30 mg total) by mouth daily. 09/28/22   Mozingo, Thereasa Solo, NP  amphetamine-dextroamphetamine (ADDERALL XR) 30 MG 24 hr capsule Take 1 capsule (30 mg total) by mouth daily. 11/23/22   Mozingo, Thereasa Solo, NP  amphetamine-dextroamphetamine (ADDERALL XR) 30 MG 24 hr capsule TAKE 1 CAPSULE BY  MOUTH DAILY 01/02/23   Mozingo, Thereasa Solo, NP  amphetamine-dextroamphetamine (ADDERALL) 20 MG tablet Take 1 tablet (20 mg total) by mouth 2 (two) times daily. 05/05/22   Mozingo, Thereasa Solo, NP  amphetamine-dextroamphetamine (ADDERALL) 20 MG tablet Take 1 tablet (20 mg total) by mouth 2 (two) times daily. 06/02/22   Mozingo, Thereasa Solo, NP  amphetamine-dextroamphetamine (ADDERALL) 20 MG tablet Take 1 tablet (20 mg total) by mouth 2 (two) times daily. 09/06/22   Mozingo, Thereasa Solo, NP  botulinum toxin Type A (BOTOX) 200 units injection Provider to inject 155 units into the muscles of the head and neck every 12 weeks. Discard remainder. 10/03/22   Anson Fret, MD  calcium carbonate (TUMS EX) 750 MG chewable tablet Chew 2 tablets by mouth as needed for heartburn.    [provider]  DULoxetine (CYMBALTA) 60 MG capsule Take 2 capsules (120 mg total) by mouth daily. 09/28/22   Mozingo, Thereasa Solo, NP  gabapentin (NEURONTIN) 600 MG tablet Take 1,200 mg by mouth 3 (three) times daily.    [provider]  LORazepam (ATIVAN) 0.5 MG tablet Take 1 tablet (0.5 mg total) by mouth daily as needed. for anxiety 12/31/22   Mozingo, Thereasa Solo, NP  Melatonin 10 MG TABS Take 10 mg by mouth daily as needed (sleep).    [provider]  methylPREDNISolone (MEDROL DOSEPAK) 4 MG TBPK tablet Take as directed per package instructions 10/17/22   Darrick Grinder, PA-C  MOUNJARO 12.5 MG/0.5ML Pen INJECT  12.5 MG INTO SKIN EVERY 7 DAYS 04/25/22   [provider]  ondansetron (ZOFRAN-ODT) 4 MG disintegrating tablet DISSOLVE ONE TABLET UNDER THE TONGUE EVERY 8 HOURS AS NEEDED FOR NAUSEA 12/28/22   Anson Fret, MD  oxyCODONE-acetaminophen (PERCOCET/ROXICET) 5-325 MG tablet Take 1 tablet by mouth every 6 (six) hours as needed for moderate pain or severe pain. 06/04/20   Vanetta Mulders, MD  OZEMPIC, 2 MG/DOSE, 8 MG/3ML SOPN Inject into the skin.    [provider]  QUEtiapine (SEROQUEL) 50 MG tablet TAKE 1 TABLET BY MOUTH DAILY AT BEDTIME 12/30/22   Mozingo, Thereasa Solo, NP  Rimegepant Sulfate (NURTEC) 75 MG TBDP Take 75 mg by mouth daily as needed. For migraines. Take as close to onset of migraine as possible. One daily maximum. 05/09/22   Anson Fret, MD  rizatriptan (MAXALT-MLT) 10 MG disintegrating tablet Take 1 tablet (10 mg total) by mouth as needed for migraine. May repeat in 2 hours if needed 05/12/21   Anson Fret, MD  traZODone (DESYREL) 150 MG tablet Take 1 tablet (150 mg total) by mouth at bedtime as needed for sleep. 09/28/22   Mozingo, Thereasa Solo, NP  levETIRAcetam (KEPPRA) 500 MG tablet Take 1 tablet (500 mg total) by mouth 2 (two) times daily. 07/10/18 07/12/19  Catarina Hartshorn, MD  metoprolol tartrate (LOPRESSOR) 25 MG tablet Take 0.5 tablets (12.5 mg total) by mouth 2 (two) times daily. 07/10/18 06/02/19  Catarina Hartshorn, MD  venlafaxine XR (EFFEXOR-XR) 75 MG 24 hr capsule Take 75 mg by mouth at bedtime.  12/12/17 06/02/19  [provider]      Allergies    Cinnamon, Clindamycin, Coconut (cocos nucifera), Lovenox [enoxaparin], Nystatin, Aspartame, Niacin, Nitrofurantoin, Other, and Pregabalin    Review of Systems   Review of Systems  Gastrointestinal:  Positive for abdominal pain and vomiting.  All other systems reviewed and are negative.   Physical Exam Updated Vital Signs BP 125/88   Pulse 84   Temp 98 F (36.7 C) (Oral)   Resp 14   Ht 5\' 1"  (1.549 m)   Wt 114.3 kg   LMP 01/03/2023 (Approximate)   SpO2 95%   Breastfeeding No   BMI 47.61 kg/m  Physical Exam Vitals and nursing note reviewed.  Constitutional:      Appearance: Normal appearance.  HENT:     Head: Normocephalic and atraumatic.     Mouth/Throat:     Mouth: Mucous membranes are moist.  Eyes:     Conjunctiva/sclera: Conjunctivae normal.     Pupils: Pupils are equal, round, and reactive to light.  Cardiovascular:     Rate and  Rhythm: Normal rate and regular rhythm.     Pulses: Normal pulses.     Heart sounds: Normal heart sounds.  Pulmonary:     Effort: Pulmonary effort is normal.     Breath sounds: Normal breath sounds.  Abdominal:     Palpations: Abdomen is soft.     Tenderness: There is abdominal tenderness. There is right CVA tenderness and rebound.     Comments: Tenderness to palpation of RUQ and epigastrium. Positive Murphy's sign. Tenderness to palpation of right flank and superior to her scapula.  Musculoskeletal:     Cervical back: Normal range of motion. No tenderness.     Comments: No tenderness to palpation of the spine.  Skin:    General: Skin is warm and dry.     Findings: No rash.  Neurological:     General:  No focal deficit present.     Mental Status: She is alert.     Sensory: No sensory deficit.     Motor: No weakness.  Psychiatric:        Mood and Affect: Mood normal.        Behavior: Behavior normal.     ED Results / Procedures / Treatments   Labs (all labs ordered are listed, but only abnormal results are displayed) Labs Reviewed  COMPREHENSIVE METABOLIC PANEL - Abnormal; Notable for the following components:      Result Value   AST 13 (*)    All other components within normal limits  CBC - Abnormal; Notable for the following components:   Platelets 413 (*)    All other components within normal limits  URINALYSIS, ROUTINE W REFLEX MICROSCOPIC - Abnormal; Notable for the following components:   Protein, ur 30 (*)    All other components within normal limits  LIPASE, BLOOD  POC URINE PREG, ED    EKG None  Radiology CT ABDOMEN PELVIS W CONTRAST  Result Date: 01/10/2023 CLINICAL DATA:  Abdominal pain, acute, nonlocalized EXAM: CT ABDOMEN AND PELVIS WITH CONTRAST TECHNIQUE: Multidetector CT imaging of the abdomen and pelvis was performed using the standard protocol following bolus administration of intravenous contrast. RADIATION DOSE REDUCTION: This exam was performed  according to the departmental dose-optimization program which includes automated exposure control, adjustment of the mA and/or kV according to patient size and/or use of iterative reconstruction technique. CONTRAST:  OMNIPAQUE IOHEXOL 300 MG/ML  SOLN COMPARISON:  05/03/2021 FINDINGS: Lower chest: No acute abnormality Hepatobiliary: No focal hepatic abnormality. Gallbladder unremarkable. Pancreas: No focal abnormality or ductal dilatation. Spleen: No focal abnormality.  Normal size. Adrenals/Urinary Tract: No adrenal abnormality. No focal renal abnormality. No stones or hydronephrosis. Urinary bladder is unremarkable. Stomach/Bowel: Normal appendix. Stomach, large and small bowel grossly unremarkable. Vascular/Lymphatic: No evidence of aneurysm or adenopathy. Reproductive: Uterus and adnexa unremarkable. No mass. Dominant follicle in the left ovary. Other: No free fluid or free air. Musculoskeletal: No acute bony abnormality. IMPRESSION: No acute findings in the abdomen or pelvis. Electronically Signed   By: Charlett Nose M.D.   On: 01/10/2023 21:04    Procedures Procedures: not indicated.   Medications Ordered in ED Medications  ondansetron (ZOFRAN) injection 4 mg (4 mg Intravenous Given 01/10/23 1910)  HYDROmorphone (DILAUDID) injection 1 mg (1 mg Intravenous Given 01/10/23 1853)  sodium chloride 0.9 % bolus 1,000 mL (0 mLs Intravenous Stopped 01/10/23 2017)  iohexol (OMNIPAQUE) 300 MG/ML solution 100 mL (100 mLs Intravenous Contrast Given 01/10/23 2019)  promethazine (PHENERGAN) 12.5 mg in sodium chloride 0.9 % 50 mL IVPB (0 mg Intravenous Stopped 01/10/23 2155)  HYDROmorphone (DILAUDID) injection 1 mg (1 mg Intravenous Given 01/10/23 2140)  HYDROmorphone (DILAUDID) injection 1 mg (1 mg Intravenous Given 01/10/23 2237)  dicyclomine (BENTYL) injection 20 mg (20 mg Intramuscular Given 01/10/23 2239)    ED Course/ Medical Decision Making/ A&P                             Medical Decision  Making Amount and/or Complexity of Data Reviewed Labs: ordered. Radiology: ordered.  Risk Prescription drug management.   This patient presents to the ED for concern of abdominal pain, this involves an extensive number of treatment options, and is a complaint that carries with it a high risk of complications and morbidity.   Differential diagnosis includes: cholecystitis, cholangitis, choledocholithiasis, pancreatitis, hepatitis,  IBS, enteritis, bowel obstruction, volvulus, etc.   Co morbidities that complicate the patient evaluation  Diabetes mellitus Hypothyroidism Lower back pain Lumbar radiculopathy   Additional history obtained:  Additional history obtained from patient's records.   Lab Tests:  I ordered and personally interpreted labs.  The pertinent results include:   Lipase is unremarkable CMP is within normal limits - no send electrolyte abnormality elevated liver enzymes. CBC is unremarkable - no signs of acute infection. UA is unremarkable. Urine pregnancy is negative.   Imaging Studies ordered:  I ordered imaging studies including CT abdomen/pelvis with contrast.  I independently visualized and interpreted imaging which showed: No acute findings in abdomen or pelvis. I agree with the radiologist interpretation   Problem List / ED Course / Critical interventions / Medication management  Abdominal pain I ordered medications including: Dilaudid x3 and Zofran x1 then Phenergan for pain and nausea.  Reevaluation of the patient after these medicines showed that the patient improved I have reviewed the patients home medicines and have made adjustments as needed I gave patient Dilaudid and Bentyl prior to discharge to help with pain later tonight.   Social Determinants of Health:  Tobacco use   Test / Admission - Considered:  Discussed results with patient. Patient is stable and safe for discharge home. Referral to GI given as well as prescriptions  for Bentyl and Phenergan sent to the pharmacy. Return precautions provided.       Final Clinical Impression(s) / ED Diagnoses Final diagnoses:  Nausea vomiting and diarrhea    Rx / DC Orders ED Discharge Orders          Ordered    dicyclomine (BENTYL) 20 MG tablet  2 times daily        01/10/23 2228    promethazine (PHENERGAN) 25 MG tablet  Every 6 hours PRN        01/10/23 2233              Maxwell Marion, PA-C 01/10/23 2243    Benjiman Core, MD 01/10/23 [phone]

## 2023-01-10 NOTE — ED Notes (Signed)
Pt ambulated to the bathroom with standby assist. Pt able to provide urine sample at this time. Will continue pt care

## 2023-01-10 NOTE — ED Notes (Signed)
A 

## 2023-01-10 NOTE — ED Notes (Signed)
Patient transported to CT 

## 2023-01-10 NOTE — ED Triage Notes (Signed)
Pt via POV c/o mid and right-sided abdominal pain radiating to back and shoulders since Sunday with associated n/v/d. Estimates 20 episodes of emesis and 20 of diarrhea today and PCP is concerned for possible pancreatitis. Pain rated 7/10 constant, worse after PO intake. GB and appendix are intact.

## 2023-01-11 NOTE — Telephone Encounter (Signed)
Lvm for pt to call back and schedule.  

## 2023-01-16 ENCOUNTER — Telehealth (INDEPENDENT_AMBULATORY_CARE_PROVIDER_SITE_OTHER): Payer: BC Managed Care – PPO | Admitting: Adult Health

## 2023-01-16 ENCOUNTER — Encounter: Payer: Self-pay | Admitting: Adult Health

## 2023-01-16 DIAGNOSIS — F410 Panic disorder [episodic paroxysmal anxiety] without agoraphobia: Secondary | ICD-10-CM | POA: Diagnosis not present

## 2023-01-16 DIAGNOSIS — F902 Attention-deficit hyperactivity disorder, combined type: Secondary | ICD-10-CM

## 2023-01-16 DIAGNOSIS — G4700 Insomnia, unspecified: Secondary | ICD-10-CM | POA: Diagnosis not present

## 2023-01-16 DIAGNOSIS — F411 Generalized anxiety disorder: Secondary | ICD-10-CM | POA: Diagnosis not present

## 2023-01-16 DIAGNOSIS — F4310 Post-traumatic stress disorder, unspecified: Secondary | ICD-10-CM

## 2023-01-16 DIAGNOSIS — F422 Mixed obsessional thoughts and acts: Secondary | ICD-10-CM

## 2023-01-16 MED ORDER — QUETIAPINE FUMARATE 100 MG PO TABS
100.0000 mg | ORAL_TABLET | Freq: Every day | ORAL | 2 refills | Status: DC
Start: 2023-01-16 — End: 2023-02-14

## 2023-01-16 MED ORDER — LORAZEPAM 0.5 MG PO TABS
0.5000 mg | ORAL_TABLET | Freq: Two times a day (BID) | ORAL | 2 refills | Status: DC | PRN
Start: 2023-01-16 — End: 2023-04-14

## 2023-01-16 NOTE — Progress Notes (Signed)
Sonya Gibson [id number] 12-24-1985 37 y.o.  Subjective:   Patient ID:  Sonya Gibson is a 37 y.o. (DOB [birth year]) female.  Chief Complaint: No chief complaint on file.   HPI Sonya Gibson presents to the office today for follow-up of PTSD, panic  attacks, obsessional thoughts and acts, depression, ADD - combined type and bipolar disorder - 2.  Describes mood today as "so-so". Pleasant. Decreased tearfulness. Mood symptoms - reports some depression, anxiety, and irritability - "not as bad". Reports panic attacks - "a lot". Reports outbursts. Reports some worry and rumination. Reports over thinking. Reports obsessive thoughts and acts. Moods are variable - reporting manic behaviors. Stating "I'm more hopeful". Feels like medications are helpful. Varying interest and motivation. Taking medications as prescribed.  Energy levels stable. Active, does not have a regular exercise routine with physical limitations. Appetite adequate. Weight loss - 248 from 252 pounds - [place]. Reports sleeping well most nights. Averages 8 hours.  Focus and concentration stable - taking Adderall XR 30mg  daily. Diagnosed with ADD in childhood. Completing tasks. Managing aspects of household. Out of work currently - planning to pursue SSD. Denies SI or HI.  Reports AH - more internal. Denies VH.  Denies self harm. Denies substance use.   Previous medication trials:  Buspar, Hydroxyzine.    GAD-7    Flowsheet Row Office Visit from 11/27/2017 in [facility]  Total GAD-7 Score 18      PHQ2-9    Flowsheet Row Office Visit from 11/27/2017 in [facility]  PHQ-2 Total Score 5  PHQ-9 Total Score 20      Flowsheet Row ED from 01/10/2023 in [facility] Emergency Department at [facility] ED from 10/17/2022 in [facility] Department at [facility] ED from 05/16/2022 in [facility] Emergency Department at [facility]  C-SSRS RISK CATEGORY No Risk No Risk No Risk        Review of Systems:  Review of Systems  Musculoskeletal:  Negative for gait problem.  Neurological:  Negative for tremors.  Psychiatric/Behavioral:         Please refer to HPI    Medications: I have reviewed the patient's current medications.  Current Outpatient Medications  Medication Sig Dispense Refill   acetaminophen (TYLENOL) 325 MG tablet Take 650 mg by mouth every 6 (six) hours as needed.     albuterol (VENTOLIN HFA) 108 (90 Base) MCG/ACT inhaler Inhale 2 puffs into the lungs every 6 (six) hours as needed for wheezing or shortness of breath.     amphetamine-dextroamphetamine (ADDERALL XR) 30 MG 24 hr capsule Take 1 capsule (30 mg total) by mouth daily. 30 capsule 0   amphetamine-dextroamphetamine (ADDERALL XR) 30 MG 24 hr capsule Take 1 capsule (30 mg total) by mouth daily. 30 capsule 0   amphetamine-dextroamphetamine (ADDERALL XR) 30 MG 24 hr capsule TAKE 1 CAPSULE BY MOUTH DAILY 30 capsule 0   botulinum toxin Type A (BOTOX) 200 units injection Provider to inject 155 units into the muscles of the head and neck every 12 weeks. Discard remainder. 1 each 3   calcium carbonate (TUMS EX) 750 MG chewable tablet Chew 2 tablets by mouth as needed for heartburn.     dicyclomine (BENTYL) 20 MG tablet Take 1 tablet (20 mg total) by mouth 2 (two) times daily. 60 tablet 0   DULoxetine (CYMBALTA) 60 MG capsule Take 2 capsules (120 mg total) by mouth daily.  60 capsule 5   gabapentin (NEURONTIN) 600 MG tablet Take 1,200 mg by mouth 3 (three) times daily.     LORazepam (ATIVAN) 0.5 MG tablet Take 1 tablet (0.5 mg total) by mouth 2 (two) times daily as needed. for anxiety 60 tablet 2   Melatonin 10 MG TABS Take 10 mg by mouth daily as needed (sleep).     methylPREDNISolone (MEDROL DOSEPAK) 4 MG TBPK tablet Take as directed per package instructions 21 tablet 0   MOUNJARO 12.5 MG/0.5ML Pen INJECT 12.5 MG INTO SKIN EVERY 7 DAYS      ondansetron (ZOFRAN-ODT) 4 MG disintegrating tablet DISSOLVE ONE TABLET UNDER THE TONGUE EVERY 8 HOURS AS NEEDED FOR NAUSEA 30 tablet 0   oxyCODONE-acetaminophen (PERCOCET/ROXICET) 5-325 MG tablet Take 1 tablet by mouth every 6 (six) hours as needed for moderate pain or severe pain. 15 tablet 0   OZEMPIC, 2 MG/DOSE, 8 MG/3ML SOPN Inject into the skin.     promethazine (PHENERGAN) 25 MG tablet Take 1 tablet (25 mg total) by mouth every 6 (six) hours as needed for nausea or vomiting. 120 tablet 0   QUEtiapine (SEROQUEL) 100 MG tablet Take 1 tablet (100 mg total) by mouth at bedtime. 30 tablet 2   Rimegepant Sulfate (NURTEC) 75 MG TBDP Take 75 mg by mouth daily as needed. For migraines. Take as close to onset of migraine as possible. One daily maximum. 10 tablet 6   rizatriptan (MAXALT-MLT) 10 MG disintegrating tablet Take 1 tablet (10 mg total) by mouth as needed for migraine. May repeat in 2 hours if needed 9 tablet 11   traZODone (DESYREL) 150 MG tablet Take 1 tablet (150 mg total) by mouth at bedtime as needed for sleep. 30 tablet 5   No current facility-administered medications for this visit.    Medication Side Effects: None  Allergies:  Allergies  Allergen Reactions   Cinnamon Anaphylaxis    (NO "REAL"/TREE BARK CINNAMON)   Clindamycin Nausea And Vomiting    Other reaction(s): gi distress Chest pain, trouble swallowing Chest pain, trouble swallowing    Coconut (Cocos Nucifera) Anaphylaxis    Can eat "fake" coconut   Lovenox [Enoxaparin] Other (See Comments)    BROKE DOWN THE SKIN AT INJECTION SITE AND CAUSED A WOUND   Nystatin Anaphylaxis   Aspartame     Other reaction(s): Other (See Comments) "Breathing Difficulty, Serious intense migraines".  "Breathing Difficulty, Serious intense migraines".     Niacin Hives   Nitrofurantoin Hives   Other     "Headache cocktail"   Blister to abd   Pregabalin     Other reaction(s): Other (See Comments) Depression with suicidal  thoughts Depression with suicidal thoughts     Past Medical History:  Diagnosis Date   Anxiety    Chronic back pain    Depression    Diabetes (HCC)    Drug-seeking behavior    Essential hypertension    GERD (gastroesophageal reflux disease)    Hypothyroidism    Kidney stone    Lumbar radiculopathy, right    Migraine headache    PTSD (post-traumatic stress disorder)     Past Medical History, Surgical history, Social history, and Family history were reviewed and updated as appropriate.   Please see review of systems for further details on the patient's review from today.   Objective:   Physical Exam:  LMP 01/03/2023 (Approximate)   Physical Exam Constitutional:      General: She is not in acute distress. Musculoskeletal:  General: No deformity.  Neurological:     Mental Status: She is alert and oriented to person, place, and time.     Coordination: Coordination normal.  Psychiatric:        Attention and Perception: Attention and perception normal. She does not perceive auditory or visual hallucinations.        Mood and Affect: Mood normal. Affect is not labile, blunt, angry or inappropriate.        Speech: Speech normal.        Behavior: Behavior normal.        Thought Content: Thought content normal. Thought content is not paranoid or delusional. Thought content does not include homicidal or suicidal ideation. Thought content does not include homicidal or suicidal plan.        Cognition and Memory: Cognition and memory normal.        Judgment: Judgment normal.     Comments: Insight intact     Lab Review:     Component Value Date/Time   NA 140 01/10/2023 1630   K 3.9 01/10/2023 1630   CL 103 01/10/2023 1630   CO2 28 01/10/2023 1630   GLUCOSE 87 01/10/2023 1630   BUN 8 01/10/2023 1630   CREATININE 0.81 01/10/2023 1630   CALCIUM 9.0 01/10/2023 1630   PROT 8.1 01/10/2023 1630   ALBUMIN 4.1 01/10/2023 1630   AST 13 (L) 01/10/2023 1630   ALT 10  01/10/2023 1630   ALKPHOS 69 01/10/2023 1630   BILITOT 0.4 01/10/2023 1630   GFRNONAA >60 01/10/2023 1630   GFRAA >60 02/21/2020 1033       Component Value Date/Time   WBC 8.6 01/10/2023 1630   RBC 4.81 01/10/2023 1630   HGB 14.0 01/10/2023 1630   HCT 42.9 01/10/2023 1630   PLT 413 (H) 01/10/2023 1630   MCV 89.2 01/10/2023 1630   MCH 29.1 01/10/2023 1630   MCHC 32.6 01/10/2023 1630   RDW 13.5 01/10/2023 1630   LYMPHSABS 4.1 (H) 10/17/2022 1747   MONOABS 0.4 10/17/2022 1747   EOSABS 0.2 10/17/2022 1747   BASOSABS 0.1 10/17/2022 1747    No results found for: "POCLITH", "LITHIUM"   No results found for: "PHENYTOIN", "PHENOBARB", "VALPROATE", "CBMZ"   .res Assessment: Plan:    Plan:  PDMP reviewed  Increase Seroquel 50mg  to 100mg  at hs for sleep  Adderall XR 30mg  every morning  Cymbalta 60mg  - 2 daily Trazadone 150mg  at bedtime - takes 50mg  to 150 - typically 75mg  at hs Lorazepam 0.5mg  daily prn panic attacks - does not take every day.  Recent labs WNL.  Monitor BP between visits while taking stimulant medication.   Time spent with patient was 25 minutes. Greater than 50% of face to face time with patient was spent on counseling and coordination of care.    RTC 4 weeks  Patient advised to contact office with any questions, adverse effects, or acute worsening in signs and symptoms.  Discussed potential benefits, risk, and side effects of benzodiazepines to include potential risk of tolerance and dependence, as well as possible drowsiness.  Advised patient not to drive if experiencing drowsiness and to take lowest possible effective dose to minimize risk of dependence and tolerance.   Discussed potential metabolic side effects associated with atypical antipsychotics, as well as potential risk for movement side effects. Advised pt to contact office if movement side effects occur.    Diagnoses and all orders for this visit:  Generalized anxiety disorder  Panic  attacks -  LORazepam (ATIVAN) 0.5 MG tablet; Take 1 tablet (0.5 mg total) by mouth 2 (two) times daily as needed. for anxiety  Insomnia, unspecified type -     QUEtiapine (SEROQUEL) 100 MG tablet; Take 1 tablet (100 mg total) by mouth at bedtime.  Attention deficit hyperactivity disorder (ADHD), combined type  PTSD (post-traumatic stress disorder)  Mixed obsessional thoughts and acts     Please see After Visit Summary for patient specific instructions.  Future Appointments  Date Time Provider Department Center  02/06/2023  9:30 AM Lomax, Amy, NP [facility] None    No orders of the defined types were placed in this encounter.   -------------------------------

## 2023-01-17 ENCOUNTER — Telehealth: Payer: Self-pay | Admitting: Family Medicine

## 2023-01-17 NOTE — Telephone Encounter (Signed)
LVM and sent mychart msg informing pt of appt change due to NP being out 

## 2023-01-24 ENCOUNTER — Other Ambulatory Visit (HOSPITAL_COMMUNITY): Payer: Self-pay

## 2023-01-26 ENCOUNTER — Other Ambulatory Visit: Payer: Self-pay | Admitting: Adult Health

## 2023-01-26 DIAGNOSIS — M961 Postlaminectomy syndrome, not elsewhere classified: Secondary | ICD-10-CM | POA: Insufficient documentation

## 2023-01-26 DIAGNOSIS — Z79899 Other long term (current) drug therapy: Secondary | ICD-10-CM | POA: Insufficient documentation

## 2023-01-26 DIAGNOSIS — F902 Attention-deficit hyperactivity disorder, combined type: Secondary | ICD-10-CM

## 2023-01-26 NOTE — Telephone Encounter (Signed)
Due 8/15

## 2023-01-28 NOTE — Telephone Encounter (Signed)
Due 8/15

## 2023-01-31 NOTE — Progress Notes (Signed)
02/07/23 ALL: She has been followed by Dr Lucia Gaskins for Botox therapy. 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 Nurtec works well for abortive therapy. She requests masseter injections. She has significant history of clenching requiring molar implants. She is a paramedic.     Consent Form Botulism Toxin Injection For Chronic Migraine    Reviewed orally with patient, additionally signature is on file:  Botulism toxin has been approved by the Federal drug administration for treatment of chronic migraine. Botulism toxin does not cure chronic migraine and it may not be effective in some patients.  The administration of botulism toxin is accomplished by injecting a small amount of toxin into the muscles of the neck and head. Dosage must be titrated for each individual. Any benefits resulting from botulism toxin tend to wear off after 3 months with a repeat injection required if benefit is to be maintained. Injections are usually done every 3-4 months with maximum effect peak achieved by about 2 or 3 weeks. Botulism toxin is expensive and you should be sure of what costs you will incur resulting from the injection.  The side effects of botulism toxin use for chronic migraine may include:   -Transient, and usually mild, facial weakness with facial injections  -Transient, and usually mild, head or neck weakness with head/neck injections  -Reduction or loss of forehead facial animation due to forehead muscle weakness  -Eyelid drooping  -Dry eye  -Pain at the site of injection or bruising at the site of injection  -Double vision  -Potential unknown long term risks   Contraindications: You should not have Botox if you are pregnant, nursing, allergic to albumin, have an infection, skin condition, or muscle weakness at the site of the injection, or have myasthenia gravis, Lambert-Eaton syndrome, or ALS.  It is also possible that as with any injection, there may be an allergic reaction  or no effect from the medication. Reduced effectiveness after repeated injections is sometimes seen and rarely infection at the injection site may occur. All care will be taken to prevent these side effects. If therapy is given over a long time, atrophy and wasting in the muscle injected may occur. Occasionally the patient's become refractory to treatment because they develop antibodies to the toxin. In this event, therapy needs to be modified.  I have read the above information and consent to the administration of botulism toxin.    BOTOX PROCEDURE NOTE FOR MIGRAINE HEADACHE  Contraindications and precautions discussed with patient(above). Aseptic procedure was observed and patient tolerated procedure. Procedure performed by Shawnie Dapper, FNP-C.   The condition has existed for more than 6 months, and pt does not have a diagnosis of ALS, Myasthenia Gravis or Lambert-Eaton Syndrome.  Risks and benefits of injections discussed and pt agrees to proceed with the procedure.  Written consent obtained  These injections are medically necessary. Pt  receives good benefits from these injections. These injections do not cause sedations or hallucinations which the oral therapies may cause.   Description of procedure:  The patient was placed in a sitting position. The standard protocol was used for Botox as follows, with 5 units of Botox injected at each site:  -Procerus muscle, midline injection  -Corrugator muscle, bilateral injection  -Frontalis muscle, bilateral injection, with 2 sites each side, medial injection was performed in the upper one third of the frontalis muscle, in the region vertical from the medial inferior edge of the superior orbital rim. The lateral injection was again in the  upper one third of the forehead vertically above the lateral limbus of the cornea, 1.5 cm lateral to the medial injection site.  -Temporalis muscle injection, 4 sites, bilaterally. The first injection was 3 cm  above the tragus of the ear, second injection site was 1.5 cm to 3 cm up from the first injection site in line with the tragus of the ear. The third injection site was 1.5-3 cm forward between the first 2 injection sites. The fourth injection site was 1.5 cm posterior to the second injection site. 5th site laterally in the temporalis  muscleat the level of the outer canthus.  -Occipitalis muscle injection, 3 sites, bilaterally. The first injection was done one half way between the occipital protuberance and the tip of the mastoid process behind the ear. The second injection site was done lateral and superior to the first, 1 fingerbreadth from the first injection. The third injection site was 1 fingerbreadth superiorly and medially from the first injection site.  -Cervical paraspinal muscle injection, 2 sites, bilaterally. The first injection site was 1 cm from the midline of the cervical spine, 3 cm inferior to the lower border of the occipital protuberance. The second injection site was 1.5 cm superiorly and laterally to the first injection site.  -Trapezius muscle injection was performed at 3 sites, bilaterally. The first injection site was in the upper trapezius muscle halfway between the inflection point of the neck, and the acromion. The second injection site was one half way between the acromion and the first injection site. The third injection was done between the first injection site and the inflection point of the neck.  - Masseter muscle injections bilaterally 5 units each    Will return for repeat injection in 3 months.   A total of 200 units of Botox was prepared, 165 units of Botox was injected as documented above, any Botox not injected was wasted. The patient tolerated the procedure well, there were no complications of the above procedure.

## 2023-02-06 ENCOUNTER — Ambulatory Visit: Payer: BC Managed Care – PPO | Admitting: Family Medicine

## 2023-02-07 ENCOUNTER — Ambulatory Visit (INDEPENDENT_AMBULATORY_CARE_PROVIDER_SITE_OTHER): Payer: BC Managed Care – PPO | Admitting: Family Medicine

## 2023-02-07 DIAGNOSIS — G43709 Chronic migraine without aura, not intractable, without status migrainosus: Secondary | ICD-10-CM | POA: Diagnosis not present

## 2023-02-07 MED ORDER — ONABOTULINUMTOXINA 200 UNITS IJ SOLR
155.0000 [IU] | Freq: Once | INTRAMUSCULAR | Status: AC
Start: 2023-02-07 — End: 2023-02-07
  Administered 2023-02-07: 165 [IU] via INTRAMUSCULAR

## 2023-02-07 NOTE — Progress Notes (Signed)
.  Botox- 200 units x 1 vial Lot: [id number] Expiration: 11/2024 NDC: 5638-7564-33  Bacteriostatic 0.9% Sodium Chloride- * mL  Lot: [id number] Expiration: 09/19/2023 NDC: 4166-0630-16  Dx:G43.709 S/P Witnessed WF:UXNAT Cook,CMA

## 2023-02-13 ENCOUNTER — Telehealth (INDEPENDENT_AMBULATORY_CARE_PROVIDER_SITE_OTHER): Payer: BC Managed Care – PPO | Admitting: Adult Health

## 2023-02-13 DIAGNOSIS — G4700 Insomnia, unspecified: Secondary | ICD-10-CM

## 2023-02-13 DIAGNOSIS — F3181 Bipolar II disorder: Secondary | ICD-10-CM | POA: Diagnosis not present

## 2023-02-13 DIAGNOSIS — F411 Generalized anxiety disorder: Secondary | ICD-10-CM

## 2023-02-13 DIAGNOSIS — F902 Attention-deficit hyperactivity disorder, combined type: Secondary | ICD-10-CM

## 2023-02-13 DIAGNOSIS — F410 Panic disorder [episodic paroxysmal anxiety] without agoraphobia: Secondary | ICD-10-CM

## 2023-02-14 ENCOUNTER — Encounter: Payer: Self-pay | Admitting: Adult Health

## 2023-02-14 ENCOUNTER — Other Ambulatory Visit: Payer: Self-pay

## 2023-02-14 MED ORDER — QUETIAPINE FUMARATE 200 MG PO TABS: 200.0000 mg | ORAL_TABLET | Freq: Every day | ORAL | 0 refills | Status: DC

## 2023-02-14 NOTE — Progress Notes (Addendum)
Sonya Gibson [id number] 01/29/1986 37 y.o.   Virtual Visit via Video Note  I connected with pt @ on 02/13/23 at  3:20 PM EDT by a video enabled telemedicine application and verified that I am speaking with the correct person using two identifiers.   I discussed the limitations of evaluation and management by telemedicine and the availability of in person appointments. The patient expressed understanding and agreed to proceed.  I discussed the assessment and treatment plan with the patient. The patient was provided an opportunity to ask questions and all were answered. The patient agreed with the plan and demonstrated an understanding of the instructions.   The patient was advised to call back or seek an in-person evaluation if the symptoms worsen or if the condition fails to improve as anticipated.  I provided 25 minutes of non-face-to-face time during this encounter.  The patient was located at home.  The provider was located at [facility].   Dorothyann Gibbs, NP   Subjective:   Patient ID:  Sonya Gibson is a 37 y.o. (DOB 10-23-1985) female.  Chief Complaint: No chief complaint on file.   HPI DYAMON KACZMAREK presents to the office today for follow-up of PTSD, panic  attacks, obsessional thoughts and acts, depression, ADD - combined type and bipolar disorder - 2.  Describes mood today as "not so good". Pleasant. Reports tearfulness. Mood symptoms - reports depression, anxiety, and irritability. Reports panic attacks.Reports outbursts. Reports worry, rumination and over thinking. Reports obsessive thoughts and acts. Moods is low. Stating "I've been struggling". Feels like medications are helpful, but would like to consider other options. Varying interest and motivation. Taking medications as prescribed.  Energy levels lower. Active, does not have a regular exercise routine with physical limitations. Appetite decreased. Weight loss - 248 from 252 pounds - [place]. Reports  sleeping difficulties. Focus and concentration stable - taking Adderall XR 30mg  daily. Diagnosed with ADD in childhood. Completing tasks. Managing aspects of household. Out of work currently - planning to pursue SSD. Denies SI or HI.  Reports AH - more internal. Denies VH - seeing shadows. Denies self harm. Denies substance use.   Previous medication trials:  Buspar, Hydroxyzine.   GAD-7    Flowsheet Row Office Visit from 11/27/2017 in [facility]  Total GAD-7 Score 18      PHQ2-9    Flowsheet Row Office Visit from 11/27/2017 in [facility]  PHQ-2 Total Score 5  PHQ-9 Total Score 20      Flowsheet Row ED from 01/10/2023 in [facility] Emergency Department at [facility] ED from 10/17/2022 in [facility] Emergency Department at [facility] ED from 05/16/2022 in [facility] Emergency Department at [facility]  C-SSRS RISK CATEGORY No Risk No Risk No Risk        Review of Systems:  Review of Systems  Musculoskeletal:  Negative for gait problem.  Neurological:  Negative for tremors.  Psychiatric/Behavioral:         Please refer to HPI    Medications: I have reviewed the patient's current medications.  Current Outpatient Medications  Medication Sig Dispense Refill   acetaminophen (TYLENOL) 325 MG tablet Take 650 mg by mouth every 6 (six) hours as needed.     albuterol (VENTOLIN HFA) 108 (90 Base) MCG/ACT inhaler Inhale 2 puffs into the lungs every 6 (six) hours as needed for wheezing or shortness of breath.  amphetamine-dextroamphetamine (ADDERALL XR) 30 MG 24 hr capsule Take 1 capsule (30 mg total) by mouth daily. 30 capsule 0   amphetamine-dextroamphetamine (ADDERALL XR) 30 MG 24 hr capsule Take 1 capsule (30 mg total) by mouth daily. 30 capsule 0   amphetamine-dextroamphetamine (ADDERALL XR) 30 MG 24 hr capsule TAKE 1 CAPSULE BY MOUTH DAILY 30 capsule 0   botulinum toxin Type A  (BOTOX) 200 units injection Provider to inject 155 units into the muscles of the head and neck every 12 weeks. Discard remainder. 1 each 3   calcium carbonate (TUMS EX) 750 MG chewable tablet Chew 2 tablets by mouth as needed for heartburn.     dicyclomine (BENTYL) 20 MG tablet Take 1 tablet (20 mg total) by mouth 2 (two) times daily. 60 tablet 0   DULoxetine (CYMBALTA) 60 MG capsule Take 2 capsules (120 mg total) by mouth daily. 60 capsule 5   gabapentin (NEURONTIN) 600 MG tablet Take 1,200 mg by mouth 3 (three) times daily.     LORazepam (ATIVAN) 0.5 MG tablet Take 1 tablet (0.5 mg total) by mouth 2 (two) times daily as needed. for anxiety 60 tablet 2   Melatonin 10 MG TABS Take 10 mg by mouth daily as needed (sleep). (Patient not taking: Reported on 02/07/2023)     methylPREDNISolone (MEDROL DOSEPAK) 4 MG TBPK tablet Take as directed per package instructions 21 tablet 0   MOUNJARO 12.5 MG/0.5ML Pen INJECT 12.5 MG INTO SKIN EVERY 7 DAYS     ondansetron (ZOFRAN-ODT) 4 MG disintegrating tablet DISSOLVE ONE TABLET UNDER THE TONGUE EVERY 8 HOURS AS NEEDED FOR NAUSEA 30 tablet 0   oxyCODONE (OXYCONTIN) 10 mg 12 hr tablet Take 10 mg by mouth. Takes one tablet 3 times daily     oxyCODONE-acetaminophen (PERCOCET/ROXICET) 5-325 MG tablet Take 1 tablet by mouth every 6 (six) hours as needed for moderate pain or severe pain. (Patient not taking: Reported on 02/07/2023) 15 tablet 0   OZEMPIC, 2 MG/DOSE, 8 MG/3ML SOPN Inject into the skin. (Patient not taking: Reported on 02/07/2023)     promethazine (PHENERGAN) 25 MG tablet Take 1 tablet (25 mg total) by mouth every 6 (six) hours as needed for nausea or vomiting. 120 tablet 0   QUEtiapine (SEROQUEL) 100 MG tablet Take 1 tablet (100 mg total) by mouth at bedtime. 30 tablet 2   Rimegepant Sulfate (NURTEC) 75 MG TBDP Take 75 mg by mouth daily as needed. For migraines. Take as close to onset of migraine as possible. One daily maximum. 10 tablet 6   rizatriptan  (MAXALT-MLT) 10 MG disintegrating tablet Take 1 tablet (10 mg total) by mouth as needed for migraine. May repeat in 2 hours if needed (Patient not taking: Reported on 02/07/2023) 9 tablet 11   traZODone (DESYREL) 150 MG tablet Take 1 tablet (150 mg total) by mouth at bedtime as needed for sleep. 30 tablet 5   No current facility-administered medications for this visit.    Medication Side Effects: None  Allergies:  Allergies  Allergen Reactions   Cinnamon Anaphylaxis    (NO "REAL"/TREE BARK CINNAMON)   Clindamycin Nausea And Vomiting    Other reaction(s): gi distress Chest pain, trouble swallowing Chest pain, trouble swallowing    Coconut (Cocos Nucifera) Anaphylaxis    Can eat "fake" coconut   Lovenox [Enoxaparin] Other (See Comments)    BROKE DOWN THE SKIN AT INJECTION SITE AND CAUSED A WOUND   Nystatin Anaphylaxis   Aspartame     Other reaction(s):  Other (See Comments) "Breathing Difficulty, Serious intense migraines".  "Breathing Difficulty, Serious intense migraines".     Niacin Hives   Nitrofurantoin Hives   Other     "Headache cocktail"   Blister to abd   Pregabalin     Other reaction(s): Other (See Comments) Depression with suicidal thoughts Depression with suicidal thoughts     Past Medical History:  Diagnosis Date   Anxiety    Chronic back pain    Depression    Diabetes (HCC)    Drug-seeking behavior    Essential hypertension    GERD (gastroesophageal reflux disease)    Hypothyroidism    Kidney stone    Lumbar radiculopathy, right    Migraine headache    PTSD (post-traumatic stress disorder)     Past Medical History, Surgical history, Social history, and Family history were reviewed and updated as appropriate.   Please see review of systems for further details on the patient's review from today.   Objective:   Physical Exam:  There were no vitals taken for this visit.  Physical Exam Constitutional:      General: She is not in acute  distress. Musculoskeletal:        General: No deformity.  Neurological:     Mental Status: She is alert and oriented to person, place, and time.     Coordination: Coordination normal.  Psychiatric:        Attention and Perception: Attention and perception normal. She does not perceive auditory or visual hallucinations.        Mood and Affect: Affect is not labile, blunt, angry or inappropriate.        Speech: Speech normal.        Behavior: Behavior normal.        Thought Content: Thought content normal. Thought content is not paranoid or delusional. Thought content does not include homicidal or suicidal ideation. Thought content does not include homicidal or suicidal plan.        Cognition and Memory: Cognition and memory normal.        Judgment: Judgment normal.     Comments: Insight intact     Lab Review:     Component Value Date/Time   NA 140 01/10/2023 1630   K 3.9 01/10/2023 1630   CL 103 01/10/2023 1630   CO2 28 01/10/2023 1630   GLUCOSE 87 01/10/2023 1630   BUN 8 01/10/2023 1630   CREATININE 0.81 01/10/2023 1630   CALCIUM 9.0 01/10/2023 1630   PROT 8.1 01/10/2023 1630   ALBUMIN 4.1 01/10/2023 1630   AST 13 (L) 01/10/2023 1630   ALT 10 01/10/2023 1630   ALKPHOS 69 01/10/2023 1630   BILITOT 0.4 01/10/2023 1630   GFRNONAA >60 01/10/2023 1630   GFRAA >60 02/21/2020 1033       Component Value Date/Time   WBC 8.6 01/10/2023 1630   RBC 4.81 01/10/2023 1630   HGB 14.0 01/10/2023 1630   HCT 42.9 01/10/2023 1630   PLT 413 (H) 01/10/2023 1630   MCV 89.2 01/10/2023 1630   MCH 29.1 01/10/2023 1630   MCHC 32.6 01/10/2023 1630   RDW 13.5 01/10/2023 1630   LYMPHSABS 4.1 (H) 10/17/2022 1747   MONOABS 0.4 10/17/2022 1747   EOSABS 0.2 10/17/2022 1747   BASOSABS 0.1 10/17/2022 1747    No results found for: "POCLITH", "LITHIUM"   No results found for: "PHENYTOIN", "PHENOBARB", "VALPROATE", "CBMZ"   .res Assessment: Plan:    Plan:  PDMP reviewed  Increase Seroquel  50mg   to 100mg  at hs for sleep  Adderall XR 30mg  every morning  Cymbalta 60mg  - 2 daily Trazadone 150mg  at bedtime - takes 50mg  to 150 - typically 75mg  at hs Lorazepam 0.5mg  daily prn panic attacks - does not take every day.  Recent labs WNL.  Monitor BP between visits while taking stimulant medication.   Time spent with patient was 25 minutes. Greater than 50% of face to face time with patient was spent on counseling and coordination of care.    RTC 4 weeks  Patient advised to contact office with any questions, adverse effects, or acute worsening in signs and symptoms.  Discussed potential benefits, risk, and side effects of benzodiazepines to include potential risk of tolerance and dependence, as well as possible drowsiness.  Advised patient not to drive if experiencing drowsiness and to take lowest possible effective dose to minimize risk of dependence and tolerance.   Discussed potential metabolic side effects associated with atypical antipsychotics, as well as potential risk for movement side effects. Advised pt to contact office if movement side effects occur.    Diagnoses and all orders for this visit:  Bipolar II disorder (HCC)  Generalized anxiety disorder  Panic attacks  Insomnia, unspecified type  Attention deficit hyperactivity disorder (ADHD), combined type     Please see After Visit Summary for patient specific instructions.  Future Appointments  Date Time Provider Department Center  05/04/2023  3:30 PM Lomax, Amy, NP [facility] None    No orders of the defined types were placed in this encounter.   -------------------------------

## 2023-02-14 NOTE — Addendum Note (Signed)
Addended by: Dorothyann Gibbs on: 02/14/2023 10:45 AM   Modules accepted: Level of Service

## 2023-03-02 DIAGNOSIS — Z0271 Encounter for disability determination: Secondary | ICD-10-CM

## 2023-03-11 ENCOUNTER — Other Ambulatory Visit: Payer: Self-pay | Admitting: Adult Health

## 2023-03-11 DIAGNOSIS — F902 Attention-deficit hyperactivity disorder, combined type: Secondary | ICD-10-CM

## 2023-03-16 ENCOUNTER — Other Ambulatory Visit: Payer: Self-pay | Admitting: Adult Health

## 2023-03-16 DIAGNOSIS — F422 Mixed obsessional thoughts and acts: Secondary | ICD-10-CM

## 2023-03-16 DIAGNOSIS — F411 Generalized anxiety disorder: Secondary | ICD-10-CM

## 2023-03-16 DIAGNOSIS — F4310 Post-traumatic stress disorder, unspecified: Secondary | ICD-10-CM

## 2023-03-17 ENCOUNTER — Telehealth: Payer: BC Managed Care – PPO | Admitting: Adult Health

## 2023-03-17 DIAGNOSIS — Z0389 Encounter for observation for other suspected diseases and conditions ruled out: Secondary | ICD-10-CM

## 2023-03-17 NOTE — Progress Notes (Signed)
No charge - severe weather when attempting to call.

## 2023-03-31 ENCOUNTER — Other Ambulatory Visit: Payer: Self-pay | Admitting: Adult Health

## 2023-03-31 DIAGNOSIS — G4700 Insomnia, unspecified: Secondary | ICD-10-CM

## 2023-03-31 HISTORY — PX: SPINAL CORD STIMULATOR IMPLANT: SHX2422

## 2023-04-02 NOTE — Telephone Encounter (Signed)
Sent MyChart message to schedule FU, was a no show last appt.

## 2023-04-10 DIAGNOSIS — Z9689 Presence of other specified functional implants: Secondary | ICD-10-CM | POA: Insufficient documentation

## 2023-04-14 ENCOUNTER — Telehealth: Payer: BC Managed Care – PPO | Admitting: Adult Health

## 2023-04-14 ENCOUNTER — Encounter: Payer: Self-pay | Admitting: Adult Health

## 2023-04-14 DIAGNOSIS — F410 Panic disorder [episodic paroxysmal anxiety] without agoraphobia: Secondary | ICD-10-CM

## 2023-04-14 DIAGNOSIS — F902 Attention-deficit hyperactivity disorder, combined type: Secondary | ICD-10-CM

## 2023-04-14 DIAGNOSIS — F411 Generalized anxiety disorder: Secondary | ICD-10-CM | POA: Diagnosis not present

## 2023-04-14 DIAGNOSIS — F4310 Post-traumatic stress disorder, unspecified: Secondary | ICD-10-CM

## 2023-04-14 DIAGNOSIS — F3181 Bipolar II disorder: Secondary | ICD-10-CM

## 2023-04-14 DIAGNOSIS — F422 Mixed obsessional thoughts and acts: Secondary | ICD-10-CM

## 2023-04-14 DIAGNOSIS — G4700 Insomnia, unspecified: Secondary | ICD-10-CM

## 2023-04-14 MED ORDER — AMPHETAMINE-DEXTROAMPHET ER 30 MG PO CP24: 30.0000 mg | ORAL_CAPSULE | Freq: Every day | ORAL | 0 refills | Status: DC

## 2023-04-14 MED ORDER — AMPHETAMINE-DEXTROAMPHET ER 30 MG PO CP24
30.0000 mg | ORAL_CAPSULE | Freq: Every day | ORAL | 0 refills | Status: DC
Start: 2023-06-09 — End: 2023-08-08

## 2023-04-14 MED ORDER — QUETIAPINE FUMARATE 200 MG PO TABS
200.0000 mg | ORAL_TABLET | Freq: Every day | ORAL | 2 refills | Status: DC
Start: 2023-04-14 — End: 2023-07-19

## 2023-04-14 MED ORDER — DULOXETINE HCL 60 MG PO CPEP: 120.0000 mg | ORAL_CAPSULE | Freq: Every day | ORAL | 2 refills | Status: DC

## 2023-04-14 MED ORDER — LORAZEPAM 0.5 MG PO TABS: 0.5000 mg | ORAL_TABLET | Freq: Two times a day (BID) | ORAL | 2 refills | Status: DC | PRN

## 2023-04-14 MED ORDER — TRAZODONE HCL 150 MG PO TABS: 150.0000 mg | ORAL_TABLET | Freq: Every day | ORAL | 2 refills | Status: DC

## 2023-04-14 NOTE — Progress Notes (Signed)
Sonya Gibson [id number] 06-28-1985 37 y.o.  Virtual Visit via Video Note  I connected with pt @ on 04/14/23 at  4:00 PM EDT by a video enabled telemedicine application and verified that I am speaking with the correct person using two identifiers.   I discussed the limitations of evaluation and management by telemedicine and the availability of in person appointments. The patient expressed understanding and agreed to proceed.  I discussed the assessment and treatment plan with the patient. The patient was provided an opportunity to ask questions and all were answered. The patient agreed with the plan and demonstrated an understanding of the instructions.   The patient was advised to call back or seek an in-person evaluation if the symptoms worsen or if the condition fails to improve as anticipated.  I provided 25 minutes of non-face-to-face time during this encounter.  The patient was located at home.  The provider was located at [facility].   Dorothyann Gibbs, NP   Subjective:   Patient ID:  Sonya Gibson is a 37 y.o. (DOB 1985-07-31) female.  Chief Complaint: No chief complaint on file.   HPI Sonya Gibson presents for follow-up of PTSD, panic attacks, obsessional thoughts and acts, depression, ADD - combined type and bipolar disorder - 2.  Describes mood today as "not the best". Pleasant. Reports tearfulness. Mood symptoms - reports depression, anxiety, and irritability. Reports panic attacks. Reports decreased outbursts. Reports worry, rumination and over thinking. Reports obsessive thoughts and acts. Reports recovering from recent surgery - spinal stimulator.Moods is lower. Stating "I feel like I'm making progress". Feels like medications are helpful, but would like to consider other options. Varying interest and motivation. Taking medications as prescribed.  Energy levels lower. Active, does not have a regular exercise routine with physical limitations. Appetite  decreased. Weight gain with inactivity - 258 pounds - [place]. Reports sleeping well right now - pain medication. Focus and concentration stable - taking Adderall XR 30mg  daily. Diagnosed with ADD in childhood. Completing tasks. Managing aspects of household. Out of work currently - planning to pursue SSD. Denies SI or HI.  Denies AH. Denies VH. Denies self harm. Denies substance use.   Previous medication trials:  Buspar, Hydroxyzine.  Review of Systems:  Review of Systems  Musculoskeletal:  Negative for gait problem.  Neurological:  Negative for tremors.  Psychiatric/Behavioral:         Please refer to HPI    Medications: I have reviewed the patient's current medications.  Current Outpatient Medications  Medication Sig Dispense Refill   acetaminophen (TYLENOL) 325 MG tablet Take 650 mg by mouth every 6 (six) hours as needed.     albuterol (VENTOLIN HFA) 108 (90 Base) MCG/ACT inhaler Inhale 2 puffs into the lungs every 6 (six) hours as needed for wheezing or shortness of breath.     amphetamine-dextroamphetamine (ADDERALL XR) 30 MG 24 hr capsule Take 1 capsule (30 mg total) by mouth daily. 30 capsule 0   amphetamine-dextroamphetamine (ADDERALL XR) 30 MG 24 hr capsule Take 1 capsule (30 mg total) by mouth daily. 30 capsule 0   amphetamine-dextroamphetamine (ADDERALL XR) 30 MG 24 hr capsule TAKE ONE CAPSULE BY MOUTH DAILY 30 capsule 0   botulinum toxin Type A (BOTOX) 200 units injection Provider to inject 155 units into the muscles of the head and neck every 12 weeks. Discard remainder. 1 each 3   calcium carbonate (TUMS EX) 750 MG chewable tablet Chew 2 tablets by mouth as needed for heartburn.  dicyclomine (BENTYL) 20 MG tablet Take 1 tablet (20 mg total) by mouth 2 (two) times daily. 60 tablet 0   DULoxetine (CYMBALTA) 60 MG capsule TAKE TWO CAPSULES BY MOUTH DAILY 60 capsule 0   gabapentin (NEURONTIN) 600 MG tablet Take 1,200 mg by mouth 3 (three) times daily.     LORazepam  (ATIVAN) 0.5 MG tablet Take 1 tablet (0.5 mg total) by mouth 2 (two) times daily as needed. for anxiety 60 tablet 2   Melatonin 10 MG TABS Take 10 mg by mouth daily as needed (sleep). (Patient not taking: Reported on 02/07/2023)     methylPREDNISolone (MEDROL DOSEPAK) 4 MG TBPK tablet Take as directed per package instructions 21 tablet 0   MOUNJARO 12.5 MG/0.5ML Pen INJECT 12.5 MG INTO SKIN EVERY 7 DAYS     ondansetron (ZOFRAN-ODT) 4 MG disintegrating tablet DISSOLVE ONE TABLET UNDER THE TONGUE EVERY 8 HOURS AS NEEDED FOR NAUSEA 30 tablet 0   oxyCODONE (OXYCONTIN) 10 mg 12 hr tablet Take 10 mg by mouth. Takes one tablet 3 times daily     oxyCODONE-acetaminophen (PERCOCET/ROXICET) 5-325 MG tablet Take 1 tablet by mouth every 6 (six) hours as needed for moderate pain or severe pain. (Patient not taking: Reported on 02/07/2023) 15 tablet 0   OZEMPIC, 2 MG/DOSE, 8 MG/3ML SOPN Inject into the skin. (Patient not taking: Reported on 02/07/2023)     promethazine (PHENERGAN) 25 MG tablet Take 1 tablet (25 mg total) by mouth every 6 (six) hours as needed for nausea or vomiting. 120 tablet 0   QUEtiapine (SEROQUEL) 200 MG tablet TAKE 1 TABLET BY MOUTH AT BEDTIME 30 tablet 0   Rimegepant Sulfate (NURTEC) 75 MG TBDP Take 75 mg by mouth daily as needed. For migraines. Take as close to onset of migraine as possible. One daily maximum. 10 tablet 6   rizatriptan (MAXALT-MLT) 10 MG disintegrating tablet Take 1 tablet (10 mg total) by mouth as needed for migraine. May repeat in 2 hours if needed (Patient not taking: Reported on 02/07/2023) 9 tablet 11   traZODone (DESYREL) 150 MG tablet TAKE 1 TABLET BY MOUTH DAILY AT BEDTIME AS NEEDED FOR SLEEP 30 tablet 0   No current facility-administered medications for this visit.    Medication Side Effects: None  Allergies:  Allergies  Allergen Reactions   Cinnamon Anaphylaxis    (NO "REAL"/TREE BARK CINNAMON)   Clindamycin Nausea And Vomiting    Other reaction(s): gi  distress Chest pain, trouble swallowing Chest pain, trouble swallowing    Coconut (Cocos Nucifera) Anaphylaxis    Can eat "fake" coconut   Lovenox [Enoxaparin] Other (See Comments)    BROKE DOWN THE SKIN AT INJECTION SITE AND CAUSED A WOUND   Nystatin Anaphylaxis   Aspartame     Other reaction(s): Other (See Comments) "Breathing Difficulty, Serious intense migraines".  "Breathing Difficulty, Serious intense migraines".     Niacin Hives   Nitrofurantoin Hives   Other     "Headache cocktail"   Blister to abd   Pregabalin     Other reaction(s): Other (See Comments) Depression with suicidal thoughts Depression with suicidal thoughts     Past Medical History:  Diagnosis Date   Anxiety    Chronic back pain    Depression    Diabetes (HCC)    Drug-seeking behavior    Essential hypertension    GERD (gastroesophageal reflux disease)    Hypothyroidism    Kidney stone    Lumbar radiculopathy, right    Migraine headache  PTSD (post-traumatic stress disorder)     Family History  Problem Relation Age of Onset   Diabetes Mellitus II Mother    Hypertension Mother    Seizures Mother    Other Mother        post stroke headaches   Stroke Mother    Hypertension Father    Diabetes Mellitus II Father    Hyperlipidemia Father    Bipolar disorder Sister    Asthma Son    Autism Son    Tourette syndrome Son    Autism Daughter        level 3   Other Daughter        tibia torsion & femoral aniversion    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    Highest education level: Not on file  Occupational History   Not on file  Tobacco Use   Smoking status: Former    Current packs/day: 0.00    Average packs/day: 2.0 packs/day for 3.0 years (6.0 ttl pk-yrs)    Types: Cigarettes    Start date: 2003    Quit date: 2006    Years since quitting: 18.8   Smokeless tobacco:  Never  Vaping Use   Vaping status: Never Used  Substance and Sexual Activity   Alcohol use: Yes    Comment: socially   Drug use: No   Sexual activity: Yes    Birth control/protection: None  Other Topics Concern   Not on file  Social History Narrative   Lives at home with husband and kids   Right handed   Caffeine: 6 cups daily   Social Determinants of Health   Financial Resource Strain: Low Risk  (01/03/2022)   Received from [facility], [facility]   Overall Financial Resource Strain (CARDIA)    Difficulty of Paying Living Expenses: Not very hard  Food Insecurity: No Food Insecurity (01/03/2022)   [facility], [facility]   Hunger Vital Sign    Worried About Running Out of Food in the Last Year: Never true    Ran Out of Food in the Last Year: Never true  Transportation Needs: No Transportation Needs (01/03/2022)   Received from [facility], [facility]   PRAPARE - Transportation    Lack of Transportation (Medical): No    Lack of Transportation (Non-Medical): No  Physical Activity: Not on file  Stress: Not on file  Social Connections: Not on file  Intimate Partner Violence: Not on file    Past Medical History, Surgical history, Social history, and Family history were reviewed and updated as appropriate.   Please see review of systems for further details on the patient's review from today.   Objective:   Physical Exam:  There were no vitals taken for this visit.  Physical Exam Constitutional:      General: She is not in acute distress. Musculoskeletal:        General: No deformity.  Neurological:     Mental Status: She is alert and oriented to person, place, and time.     Coordination: Coordination normal.  Psychiatric:        Attention and Perception: Attention and perception normal. She does not perceive auditory or visual hallucinations.        Mood and Affect: Affect is not labile, blunt, angry  or inappropriate.         Speech: Speech normal.        Behavior: Behavior normal.        Thought Content: Thought content normal. Thought content is not paranoid or delusional. Thought content does not include homicidal or suicidal ideation. Thought content does not include homicidal or suicidal plan.        Cognition and Memory: Cognition and memory normal.        Judgment: Judgment normal.     Comments: Insight intact     Lab Review:     Component Value Date/Time   NA 140 01/10/2023 1630   K 3.9 01/10/2023 1630   CL 103 01/10/2023 1630   CO2 28 01/10/2023 1630   GLUCOSE 87 01/10/2023 1630   BUN 8 01/10/2023 1630   CREATININE 0.81 01/10/2023 1630   CALCIUM 9.0 01/10/2023 1630   PROT 8.1 01/10/2023 1630   ALBUMIN 4.1 01/10/2023 1630   AST 13 (L) 01/10/2023 1630   ALT 10 01/10/2023 1630   ALKPHOS 69 01/10/2023 1630   BILITOT 0.4 01/10/2023 1630   GFRNONAA >60 01/10/2023 1630   GFRAA >60 02/21/2020 1033       Component Value Date/Time   WBC 8.6 01/10/2023 1630   RBC 4.81 01/10/2023 1630   HGB 14.0 01/10/2023 1630   HCT 42.9 01/10/2023 1630   PLT 413 (H) 01/10/2023 1630   MCV 89.2 01/10/2023 1630   MCH 29.1 01/10/2023 1630   MCHC 32.6 01/10/2023 1630   RDW 13.5 01/10/2023 1630   LYMPHSABS 4.1 (H) 10/17/2022 1747   MONOABS 0.4 10/17/2022 1747   EOSABS 0.2 10/17/2022 1747   BASOSABS 0.1 10/17/2022 1747    No results found for: "POCLITH", "LITHIUM"   No results found for: "PHENYTOIN", "PHENOBARB", "VALPROATE", "CBMZ"   .res Assessment: Plan:    Plan:  PDMP reviewed  Seroquel 200mg  at hs for sleep  Adderall XR 30mg  every morning  Cymbalta 60mg  - 2 daily Trazadone 150mg  at bedtime - takes 50mg  to 150 - typically 75mg  at hs Lorazepam 0.5mg  daily prn panic attacks - does not take every day.  Recent labs WNL.  Monitor BP between visits while taking stimulant medication.   Time spent with patient was 25 minutes. Greater than 50% of face to face time with patient was spent on  counseling and coordination of care.    RTC 4 weeks  Patient advised to contact office with any questions, adverse effects, or acute worsening in signs and symptoms.  Discussed potential benefits, risk, and side effects of benzodiazepines to include potential risk of tolerance and dependence, as well as possible drowsiness.  Advised patient not to drive if experiencing drowsiness and to take lowest possible effective dose to minimize risk of dependence and tolerance.   Discussed potential metabolic side effects associated with atypical antipsychotics, as well as potential risk for movement side effects. Advised pt to contact office if movement side effects occur.   There are no diagnoses linked to this encounter.   Please see After Visit Summary for patient specific instructions.  Future Appointments  Date Time Provider Department Center  04/14/2023  4:00 PM Sterling Mondo, Thereasa Solo, NP [facility] None  05/04/2023  3:30 PM Lomax, Amy, NP [facility] None    No orders of the defined types were placed in this encounter.     -------------------------------

## 2023-04-19 ENCOUNTER — Other Ambulatory Visit: Payer: Self-pay

## 2023-04-21 ENCOUNTER — Other Ambulatory Visit: Payer: Self-pay

## 2023-04-24 ENCOUNTER — Other Ambulatory Visit (HOSPITAL_COMMUNITY): Payer: Self-pay

## 2023-05-01 ENCOUNTER — Other Ambulatory Visit: Payer: Self-pay

## 2023-05-02 NOTE — Progress Notes (Unsigned)
05/04/23 ALL: She returns for Botox. She reports headaches have been a little worse over the past 12 weeks. She had nerve stimulator placed and is having some pain. Nurtec does help. Masseter injections are helpful. She will journal headaches over next 12 weeks.   02/07/2023 ALL: She has been followed by Dr Lucia Gaskins for Botox therapy. 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 Nurtec works well for abortive therapy. She requests masseter injections. She has significant history of clenching requiring molar implants. She is a paramedic.     Consent Form Botulism Toxin Injection For Chronic Migraine    Reviewed orally with patient, additionally signature is on file:  Botulism toxin has been approved by the Federal drug administration for treatment of chronic migraine. Botulism toxin does not cure chronic migraine and it may not be effective in some patients.  The administration of botulism toxin is accomplished by injecting a small amount of toxin into the muscles of the neck and head. Dosage must be titrated for each individual. Any benefits resulting from botulism toxin tend to wear off after 3 months with a repeat injection required if benefit is to be maintained. Injections are usually done every 3-4 months with maximum effect peak achieved by about 2 or 3 weeks. Botulism toxin is expensive and you should be sure of what costs you will incur resulting from the injection.  The side effects of botulism toxin use for chronic migraine may include:   -Transient, and usually mild, facial weakness with facial injections  -Transient, and usually mild, head or neck weakness with head/neck injections  -Reduction or loss of forehead facial animation due to forehead muscle weakness  -Eyelid drooping  -Dry eye  -Pain at the site of injection or bruising at the site of injection  -Double vision  -Potential unknown long term risks   Contraindications: You should not have Botox if you are  pregnant, nursing, allergic to albumin, have an infection, skin condition, or muscle weakness at the site of the injection, or have myasthenia gravis, Lambert-Eaton syndrome, or ALS.  It is also possible that as with any injection, there may be an allergic reaction or no effect from the medication. Reduced effectiveness after repeated injections is sometimes seen and rarely infection at the injection site may occur. All care will be taken to prevent these side effects. If therapy is given over a long time, atrophy and wasting in the muscle injected may occur. Occasionally the patient's become refractory to treatment because they develop antibodies to the toxin. In this event, therapy needs to be modified.  I have read the above information and consent to the administration of botulism toxin.    BOTOX PROCEDURE NOTE FOR MIGRAINE HEADACHE  Contraindications and precautions discussed with patient(above). Aseptic procedure was observed and patient tolerated procedure. Procedure performed by Shawnie Dapper, FNP-C.   The condition has existed for more than 6 months, and pt does not have a diagnosis of ALS, Myasthenia Gravis or Lambert-Eaton Syndrome.  Risks and benefits of injections discussed and pt agrees to proceed with the procedure.  Written consent obtained  These injections are medically necessary. Pt  receives good benefits from these injections. These injections do not cause sedations or hallucinations which the oral therapies may cause.   Description of procedure:  The patient was placed in a sitting position. The standard protocol was used for Botox as follows, with 5 units of Botox injected at each site:  -Procerus muscle, midline injection  -Corrugator muscle,  bilateral injection  -Frontalis muscle, bilateral injection, with 2 sites each side, medial injection was performed in the upper one third of the frontalis muscle, in the region vertical from the medial inferior edge of the superior  orbital rim. The lateral injection was again in the upper one third of the forehead vertically above the lateral limbus of the cornea, 1.5 cm lateral to the medial injection site.  -Temporalis muscle injection, 4 sites, bilaterally. The first injection was 3 cm above the tragus of the ear, second injection site was 1.5 cm to 3 cm up from the first injection site in line with the tragus of the ear. The third injection site was 1.5-3 cm forward between the first 2 injection sites. The fourth injection site was 1.5 cm posterior to the second injection site. 5th site laterally in the temporalis  muscleat the level of the outer canthus.  -Occipitalis muscle injection, 3 sites, bilaterally. The first injection was done one half way between the occipital protuberance and the tip of the mastoid process behind the ear. The second injection site was done lateral and superior to the first, 1 fingerbreadth from the first injection. The third injection site was 1 fingerbreadth superiorly and medially from the first injection site.  -Cervical paraspinal muscle injection, 2 sites, bilaterally. The first injection site was 1 cm from the midline of the cervical spine, 3 cm inferior to the lower border of the occipital protuberance. The second injection site was 1.5 cm superiorly and laterally to the first injection site.  -Trapezius muscle injection was performed at 3 sites, bilaterally. The first injection site was in the upper trapezius muscle halfway between the inflection point of the neck, and the acromion. The second injection site was one half way between the acromion and the first injection site. The third injection was done between the first injection site and the inflection point of the neck.  - Masseter muscle injections bilaterally 5 units each    Will return for repeat injection in 3 months.   A total of 200 units of Botox was prepared, 165 units of Botox was injected as documented above, any Botox not  injected was wasted. The patient tolerated the procedure well, there were no complications of the above procedure.

## 2023-05-03 ENCOUNTER — Other Ambulatory Visit: Payer: Self-pay

## 2023-05-03 ENCOUNTER — Ambulatory Visit: Payer: BC Managed Care – PPO | Admitting: Family Medicine

## 2023-05-03 NOTE — Progress Notes (Signed)
Specialty Pharmacy Refill Coordination Note  Sonya Gibson is a 38 y.o. female contacted today regarding refills of specialty medication(s) Onabotulinumtoxina   Patient requested Courier to Provider Office   Delivery date: 05/04/23   Verified address: [facility]   [address] [place] [address]   Medication will be filled on 05/03/23.

## 2023-05-04 ENCOUNTER — Encounter: Payer: Self-pay | Admitting: Family Medicine

## 2023-05-04 ENCOUNTER — Ambulatory Visit (INDEPENDENT_AMBULATORY_CARE_PROVIDER_SITE_OTHER): Payer: BC Managed Care – PPO | Admitting: Family Medicine

## 2023-05-04 DIAGNOSIS — G43709 Chronic migraine without aura, not intractable, without status migrainosus: Secondary | ICD-10-CM

## 2023-05-04 MED ORDER — ONABOTULINUMTOXINA 200 UNITS IJ SOLR
155.0000 [IU] | Freq: Once | INTRAMUSCULAR | Status: AC
  Administered 2023-05-04: 155 [IU] via INTRAMUSCULAR

## 2023-05-04 NOTE — Progress Notes (Signed)
Botox- 200 units x 1 vial Lot: [id number] Expiration: 08/2025 NDC: 8469-6295-28  Bacteriostatic 0.9% Sodium Chloride- * mL  Lot: [id number] Expiration: 09/19/2023 NDC: 0102-7253-66  Dx: Y40.347 S/P  Witnessed by Wyatt Haste

## 2023-05-16 ENCOUNTER — Telehealth (INDEPENDENT_AMBULATORY_CARE_PROVIDER_SITE_OTHER): Payer: Self-pay | Admitting: Adult Health

## 2023-05-16 DIAGNOSIS — Z0389 Encounter for observation for other suspected diseases and conditions ruled out: Secondary | ICD-10-CM

## 2023-05-16 NOTE — Progress Notes (Signed)
Patient no show appointment. ? ?

## 2023-06-02 ENCOUNTER — Telehealth: Payer: Self-pay | Admitting: Pharmacy Technician

## 2023-06-02 ENCOUNTER — Other Ambulatory Visit (HOSPITAL_COMMUNITY): Payer: Self-pay

## 2023-06-02 NOTE — Telephone Encounter (Signed)
Pharmacy Patient Advocate Encounter   Received notification from CoverMyMeds that prior authorization for Nurtec 75MG  dispersible tablets is required/requested.   Insurance verification completed.   The patient is insured through [employer] .   Per test claim: PA required; PA submitted to above mentioned insurance via CoverMyMeds Key/confirmation #/EOC [id number] Status is pending

## 2023-06-08 ENCOUNTER — Other Ambulatory Visit (HOSPITAL_COMMUNITY): Payer: Self-pay

## 2023-06-08 NOTE — Telephone Encounter (Signed)
Pharmacy Patient Advocate Encounter  Received notification from [facility] that Prior Authorization for Nurtec 75MG  dispersible tablets has been APPROVED from 06/02/2023 to 06/01/2024 with QUANTITY LIMIT of 8 tablets for a 30 day supply.  Copay is $0 PA #/Case ID/Reference #: PA Case ID #: [id number]

## 2023-06-11 ENCOUNTER — Other Ambulatory Visit: Payer: Self-pay | Admitting: Adult Health

## 2023-06-11 DIAGNOSIS — F902 Attention-deficit hyperactivity disorder, combined type: Secondary | ICD-10-CM

## 2023-06-12 NOTE — Telephone Encounter (Signed)
Please schedule pt an appt. Lv 10/25; no showed 11/22

## 2023-06-12 NOTE — Telephone Encounter (Signed)
Lv 10/25; lv 11/22; waiting for f/u to be scheduled. No showed 11/22

## 2023-07-11 ENCOUNTER — Telehealth: Payer: Self-pay | Admitting: Family Medicine

## 2023-07-11 NOTE — Telephone Encounter (Signed)
Pt last saw Amy Lomax,NP 05/04/23 for Botox. Primary MD: Dr. Lucia Gaskins. Next Botox scheduled for: 08/03/23.  Rescue migraine med: Nurtec.   I called pt. Having increased pressure, eye pain/nausea x3 days. Cannot keep anything down. Nose dripping started today. Does not feel she is sick. Feels it is from being tearful. Taking zofran/phenergan but ineffective. Hard to keep water down.  She has to lay flat to try and help sx slightly. Last took Nurtec day 1 and again this morning. Also tried: ibuprofen (took last 2 hr ago-800mg ), tylenol (around 7am), benadryl (last took 2 hr ago with ibuprofen). Prescribed oxycodone for spinal damage and took this and ineffective.    Pt has diabetes but well controlled. On Mounjaro for this.  Open to steroid pack or IV infusion if approved for this. Said she could try and find a ride. However, did relay we typically do not recommend steroids with hx diabetes.    She just got spinal cord stimulator placed 03/2023. Procedure went well and no issues since.   I placed on hold to speak with Dr. Lucia Gaskins since Amy out. Dr. Lucia Gaskins left for the day. Dr. Marjory Lies unavailable. I recommended pt either do virtual urgent care visit or go to urgent care walk in clinic for immediate treatment. Asked her to call back if they are unable to provide relief. Amy will be back in tomorrow. She verbalized understanding.

## 2023-07-11 NOTE — Telephone Encounter (Signed)
Pt is asking for a call to discuss a 3 day headache, she has used her rescue meds and wants to know If there is anything else she can take, please call.

## 2023-07-18 ENCOUNTER — Other Ambulatory Visit: Payer: Self-pay

## 2023-07-19 ENCOUNTER — Other Ambulatory Visit: Payer: Self-pay | Admitting: Adult Health

## 2023-07-19 DIAGNOSIS — G4700 Insomnia, unspecified: Secondary | ICD-10-CM

## 2023-07-19 DIAGNOSIS — F4310 Post-traumatic stress disorder, unspecified: Secondary | ICD-10-CM

## 2023-07-19 DIAGNOSIS — F422 Mixed obsessional thoughts and acts: Secondary | ICD-10-CM

## 2023-07-19 DIAGNOSIS — F411 Generalized anxiety disorder: Secondary | ICD-10-CM

## 2023-07-21 ENCOUNTER — Other Ambulatory Visit: Payer: Self-pay

## 2023-07-24 ENCOUNTER — Other Ambulatory Visit (HOSPITAL_COMMUNITY): Payer: Self-pay

## 2023-07-24 ENCOUNTER — Other Ambulatory Visit: Payer: Self-pay | Admitting: Adult Health

## 2023-07-24 DIAGNOSIS — G4700 Insomnia, unspecified: Secondary | ICD-10-CM

## 2023-07-25 ENCOUNTER — Other Ambulatory Visit (HOSPITAL_COMMUNITY): Payer: Self-pay

## 2023-07-25 ENCOUNTER — Other Ambulatory Visit (HOSPITAL_COMMUNITY): Payer: Self-pay | Admitting: Pharmacy Technician

## 2023-07-25 DIAGNOSIS — Z0271 Encounter for disability determination: Secondary | ICD-10-CM

## 2023-07-25 NOTE — Progress Notes (Signed)
 Specialty Pharmacy Refill Coordination Note  Sonya Gibson is a 39 y.o. female contacted today regarding refills of specialty medication(s) OnabotulinumtoxinA  (Botox )   Patient requested Courier to Provider Office   Delivery date: 07/31/23   [facility] [address]   Medication will be filled on 07/28/23.

## 2023-07-28 ENCOUNTER — Other Ambulatory Visit: Payer: Self-pay

## 2023-08-03 ENCOUNTER — Ambulatory Visit: Payer: BC Managed Care – PPO | Admitting: Family Medicine

## 2023-08-04 ENCOUNTER — Other Ambulatory Visit: Payer: Self-pay | Admitting: Adult Health

## 2023-08-04 DIAGNOSIS — F902 Attention-deficit hyperactivity disorder, combined type: Secondary | ICD-10-CM

## 2023-08-04 DIAGNOSIS — F410 Panic disorder [episodic paroxysmal anxiety] without agoraphobia: Secondary | ICD-10-CM

## 2023-08-04 NOTE — Telephone Encounter (Signed)
Please send for Gina  Add LF 1/16; ATIVAN LF 1/9 LV 10/25 NV 2/18

## 2023-08-08 ENCOUNTER — Encounter: Payer: Self-pay | Admitting: Adult Health

## 2023-08-08 ENCOUNTER — Ambulatory Visit: Payer: BC Managed Care – PPO | Admitting: Adult Health

## 2023-08-08 ENCOUNTER — Telehealth: Payer: BC Managed Care – PPO | Admitting: Adult Health

## 2023-08-08 DIAGNOSIS — F3181 Bipolar II disorder: Secondary | ICD-10-CM

## 2023-08-08 DIAGNOSIS — F4310 Post-traumatic stress disorder, unspecified: Secondary | ICD-10-CM | POA: Diagnosis not present

## 2023-08-08 DIAGNOSIS — F902 Attention-deficit hyperactivity disorder, combined type: Secondary | ICD-10-CM

## 2023-08-08 DIAGNOSIS — F411 Generalized anxiety disorder: Secondary | ICD-10-CM

## 2023-08-08 DIAGNOSIS — F422 Mixed obsessional thoughts and acts: Secondary | ICD-10-CM

## 2023-08-08 DIAGNOSIS — F410 Panic disorder [episodic paroxysmal anxiety] without agoraphobia: Secondary | ICD-10-CM

## 2023-08-08 DIAGNOSIS — F419 Anxiety disorder, unspecified: Secondary | ICD-10-CM

## 2023-08-08 DIAGNOSIS — G4700 Insomnia, unspecified: Secondary | ICD-10-CM

## 2023-08-08 MED ORDER — ARIPIPRAZOLE 2 MG PO TABS: 2.0000 mg | ORAL_TABLET | Freq: Every day | ORAL | 2 refills | Status: DC

## 2023-08-08 MED ORDER — AMPHETAMINE-DEXTROAMPHET ER 30 MG PO CP24: 30.0000 mg | ORAL_CAPSULE | Freq: Every day | ORAL | 0 refills | Status: DC

## 2023-08-08 MED ORDER — QUETIAPINE FUMARATE 100 MG PO TABS: 100.0000 mg | ORAL_TABLET | Freq: Every day | ORAL | 5 refills | Status: DC

## 2023-08-08 MED ORDER — LORAZEPAM 0.5 MG PO TABS: 0.5000 mg | ORAL_TABLET | Freq: Two times a day (BID) | ORAL | 0 refills | Status: DC | PRN

## 2023-08-08 MED ORDER — DULOXETINE HCL 60 MG PO CPEP: 120.0000 mg | ORAL_CAPSULE | Freq: Every day | ORAL | 0 refills | Status: DC

## 2023-08-08 MED ORDER — TRAZODONE HCL 150 MG PO TABS: 150.0000 mg | ORAL_TABLET | Freq: Every day | ORAL | 0 refills | Status: DC

## 2023-08-08 NOTE — Progress Notes (Signed)
 Sonya Gibson [id number] [birth year] 38 y.o.  Virtual Visit via Video Note  I connected with pt @ on 08/08/23 at  9:00 AM EST by a video enabled telemedicine application and verified that I am speaking with the correct person using two identifiers.   I discussed the limitations of evaluation and management by telemedicine and the availability of in person appointments. The patient expressed understanding and agreed to proceed.  I discussed the assessment and treatment plan with the patient. The patient was provided an opportunity to ask questions and all were answered. The patient agreed with the plan and demonstrated an understanding of the instructions.   The patient was advised to call back or seek an in-person evaluation if the symptoms worsen or if the condition fails to improve as anticipated.  I provided 25 minutes of non-face-to-face time during this encounter.  The patient was located at home.  The provider was located at [facility].   Dorothyann Gibbs, NP   Subjective:   Patient ID:  Sonya Gibson is a 38 y.o. (DOB 09-14-1985) female.  Chief Complaint: No chief complaint on file.   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  Describes mood today as "not the best". Pleasant. Reports tearfulness. Mood symptoms - reports depression, anxiety, and irritability. Reports decreased interest and motivation. Reports panic attacks. Reports flash backs. Reports outbursts. Reports worry, rumination and over thinking. Reports obsessive thoughts and acts. Reports physical limitations and disabilities. Moods is lower. Stating "I feel like I'm better than I was when we started, but still having good and bad days, but more bad days". Taking medications as prescribed.  Energy levels lower. Active, does not have a regular exercise routine with physical limitations. Appetite  decreased. Weight gain with inactivity - 258 to 268 pounds - [place]. Reports sleeping well most nights. Reports some focus and concentration difficulties - taking Adderall XR 30mg  daily. Diagnosed with ADD in childhood. Completing tasks. Managing aspects of household. Reports recently being approved for LTD through her employer. Plans to continue to pursue SSD. Denies SI or HI.  Denies AH. Denies VH. Denies self harm. Denies substance use.   Previous medication trials:  Buspar, Hydroxyzine.   Review of Systems:  Review of Systems  Musculoskeletal:  Negative for gait problem.  Neurological:  Negative for tremors.  Psychiatric/Behavioral:         Please refer to HPI    Medications: I have reviewed the patient's current medications.  Current Outpatient Medications  Medication Sig Dispense Refill   acetaminophen (TYLENOL) 325 MG tablet Take 650 mg by mouth every 6 (six) hours as needed.     albuterol (VENTOLIN HFA) 108 (90 Base) MCG/ACT inhaler Inhale 2 puffs into the lungs every 6 (six) hours as needed for wheezing or shortness of breath.     amphetamine-dextroamphetamine (ADDERALL XR) 30 MG 24 hr capsule Take 1 capsule (30 mg total) by mouth daily. 30 capsule 0   amphetamine-dextroamphetamine (ADDERALL XR) 30 MG 24 hr capsule Take 1 capsule (30 mg total) by mouth daily. 30 capsule 0   amphetamine-dextroamphetamine (ADDERALL XR) 30 MG 24 hr capsule TAKE ONE CAPSULE BY MOUTH DAILY 30 capsule 0   botulinum toxin Type A (BOTOX) 200 units injection Provider to inject 155 units into the muscles of the head and neck every 12 weeks. Discard remainder. 1 each 3   calcium carbonate (TUMS EX)  750 MG chewable tablet Chew 2 tablets by mouth as needed for heartburn.     dicyclomine (BENTYL) 20 MG tablet Take 1 tablet (20 mg total) by mouth 2 (two) times daily. 60 tablet 0   DULoxetine (CYMBALTA) 60 MG capsule TAKE TWO CAPSULES BY MOUTH DAILY 60 capsule 0   gabapentin (NEURONTIN) 600 MG tablet Take  1,200 mg by mouth 3 (three) times daily.     LORazepam (ATIVAN) 0.5 MG tablet TAKE 1 TABLET BY MOUTH TWICE DAILY AS NEEDED FOR ANXIETY 60 tablet 0   Melatonin 10 MG TABS Take 10 mg by mouth daily as needed (sleep). (Patient not taking: Reported on 02/07/2023)     methylPREDNISolone (MEDROL DOSEPAK) 4 MG TBPK tablet Take as directed per package instructions 21 tablet 0   MOUNJARO 12.5 MG/0.5ML Pen INJECT 12.5 MG INTO SKIN EVERY 7 DAYS     ondansetron (ZOFRAN-ODT) 4 MG disintegrating tablet DISSOLVE ONE TABLET UNDER THE TONGUE EVERY 8 HOURS AS NEEDED FOR NAUSEA 30 tablet 0   oxyCODONE (OXYCONTIN) 10 mg 12 hr tablet Take 10 mg by mouth. Takes one tablet 3 times daily     oxyCODONE-acetaminophen (PERCOCET/ROXICET) 5-325 MG tablet Take 1 tablet by mouth every 6 (six) hours as needed for moderate pain or severe pain. (Patient not taking: Reported on 02/07/2023) 15 tablet 0   OZEMPIC, 2 MG/DOSE, 8 MG/3ML SOPN Inject into the skin. (Patient not taking: Reported on 02/07/2023)     promethazine (PHENERGAN) 25 MG tablet Take 1 tablet (25 mg total) by mouth every 6 (six) hours as needed for nausea or vomiting. 120 tablet 0   QUEtiapine (SEROQUEL) 200 MG tablet TAKE 1 TABLET BY MOUTH AT BEDTIME 30 tablet 0   Rimegepant Sulfate (NURTEC) 75 MG TBDP Take 75 mg by mouth daily as needed. For migraines. Take as close to onset of migraine as possible. One daily maximum. 10 tablet 6   rizatriptan (MAXALT-MLT) 10 MG disintegrating tablet Take 1 tablet (10 mg total) by mouth as needed for migraine. May repeat in 2 hours if needed (Patient not taking: Reported on 02/07/2023) 9 tablet 11   traZODone (DESYREL) 150 MG tablet TAKE 1 TABLET BY MOUTH AT BEDTIME 30 tablet 0   No current facility-administered medications for this visit.    Medication Side Effects: None  Allergies:  Allergies  Allergen Reactions   Cinnamon Anaphylaxis    (NO "REAL"/TREE BARK CINNAMON)   Clindamycin Nausea And Vomiting    Other reaction(s): gi  distress Chest pain, trouble swallowing Chest pain, trouble swallowing    Coconut (Cocos Nucifera) Anaphylaxis    Can eat "fake" coconut   Lovenox [Enoxaparin] Other (See Comments)    BROKE DOWN THE SKIN AT INJECTION SITE AND CAUSED A WOUND   Nystatin Anaphylaxis   Aspartame     Other reaction(s): Other (See Comments) "Breathing Difficulty, Serious intense migraines".  "Breathing Difficulty, Serious intense migraines".     Niacin Hives   Nitrofurantoin Hives   Other     "Headache cocktail"   Blister to abd   Pregabalin     Other reaction(s): Other (See Comments) Depression with suicidal thoughts Depression with suicidal thoughts     Past Medical History:  Diagnosis Date   Anxiety    Chronic back pain    Depression    Diabetes (HCC)    Drug-seeking behavior    Essential hypertension    GERD (gastroesophageal reflux disease)    Hypothyroidism    Kidney stone    Lumbar  radiculopathy, right    Migraine headache    PTSD (post-traumatic stress disorder)     Family History  Problem Relation Age of Onset   Diabetes Mellitus II Mother    Hypertension Mother    Seizures Mother    Other Mother        post stroke headaches   Stroke Mother    Hypertension Father    Diabetes Mellitus II Father    Hyperlipidemia Father    Bipolar disorder Sister    Asthma Son    Autism Son    Tourette syndrome Son    Autism Daughter        level 3   Other Daughter        tibia torsion & femoral aniversion    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    Highest education level: Not on file  Occupational History   Not on file  Tobacco Use   Smoking status: Former    Current packs/day: 0.00    Average packs/day: 2.0 packs/day for 3.0 years (6.0 ttl pk-yrs)    Types: Cigarettes    Start date: 2003    Quit date: 2006    Years since quitting: 19.1   Smokeless tobacco:  Never  Vaping Use   Vaping status: Never Used  Substance and Sexual Activity   Alcohol use: Yes    Comment: socially   Drug use: No   Sexual activity: Yes    Birth control/protection: None  Other Topics Concern   Not on file  Social History Narrative   Lives at home with husband and kids   Right handed   Caffeine: 6 cups daily   Social Drivers of Health   Financial Resource Strain: Low Risk  (01/03/2022)   Received [facility], [facility]   Overall Financial Resource Strain (CARDIA)    Difficulty of Paying Living Expenses: Not very hard  Food Insecurity: No Food Insecurity (01/03/2022)   Received from [facility], [facility]   Hunger Vital Sign    Worried About Running Out of Food in the Last Year: Never true    Ran Out of Food in the Last Year: Never true  Transportation Needs: No Transportation Needs (01/03/2022)   Received from [facility], [facility]   PRAPARE - Transportation    Lack of Transportation (Medical): No    Lack of Transportation (Non-Medical): No  Physical Activity: Not on file  Stress: Not on file  Social Connections: Not on file  Intimate Partner Violence: Not on file    Past Medical History, Surgical history, Social history, and Family history were reviewed and updated as appropriate.   Please see review of systems for further details on the patient's review from today.   Objective:   Physical Exam:  There were no vitals taken for this visit.  Physical Exam Constitutional:      General: She is not in acute distress. Musculoskeletal:        General: No deformity.  Neurological:     Mental Status: She is alert and oriented to person, place, and time.     Coordination: Coordination normal.  Psychiatric:        Attention and Perception: Attention and perception normal. She does not perceive auditory or visual hallucinations.  Mood and Affect: Affect is not labile, blunt, angry or inappropriate.        Speech:  Speech normal.        Behavior: Behavior normal.        Thought Content: Thought content normal. Thought content is not paranoid or delusional. Thought content does not include homicidal or suicidal ideation. Thought content does not include homicidal or suicidal plan.        Cognition and Memory: Cognition and memory normal.        Judgment: Judgment normal.     Comments: Insight intact     Lab Review:     Component Value Date/Time   NA 140 01/10/2023 1630   K 3.9 01/10/2023 1630   CL 103 01/10/2023 1630   CO2 28 01/10/2023 1630   GLUCOSE 87 01/10/2023 1630   BUN 8 01/10/2023 1630   CREATININE 0.81 01/10/2023 1630   CALCIUM 9.0 01/10/2023 1630   PROT 8.1 01/10/2023 1630   ALBUMIN 4.1 01/10/2023 1630   AST 13 (L) 01/10/2023 1630   ALT 10 01/10/2023 1630   ALKPHOS 69 01/10/2023 1630   BILITOT 0.4 01/10/2023 1630   GFRNONAA >60 01/10/2023 1630   GFRAA >60 02/21/2020 1033       Component Value Date/Time   WBC 8.6 01/10/2023 1630   RBC 4.81 01/10/2023 1630   HGB 14.0 01/10/2023 1630   HCT 42.9 01/10/2023 1630   PLT 413 (H) 01/10/2023 1630   MCV 89.2 01/10/2023 1630   MCH 29.1 01/10/2023 1630   MCHC 32.6 01/10/2023 1630   RDW 13.5 01/10/2023 1630   LYMPHSABS 4.1 (H) 10/17/2022 1747   MONOABS 0.4 10/17/2022 1747   EOSABS 0.2 10/17/2022 1747   BASOSABS 0.1 10/17/2022 1747    No results found for: "POCLITH", "LITHIUM"   No results found for: "PHENYTOIN", "PHENOBARB", "VALPROATE", "CBMZ"   .res Assessment: Plan:    Plan:  PDMP reviewed  Add Abilify 2mg  daily for mood  Decrease Seroquel 200mg  to 100mg  at hs for sleep Trazadone 150mg  at bedtime - typically 75mg  at hs  Adderall XR 30mg  every morning  Cymbalta 60mg  - 2 daily  Lorazepam 0.5mg  daily prn panic attacks - does not take every day.  Recent labs WNL  Monitor BP between visits while taking stimulant medication.   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 Discussed continuing current medication regimen.  Patient advised to contact office with any questions, adverse effects, or acute worsening in signs and symptoms.  Discussed potential benefits, risk, and side effects of benzodiazepines to include potential risk of tolerance and dependence, as well as possible drowsiness.  Advised patient not to drive if experiencing drowsiness and to take lowest possible effective dose to minimize risk of dependence and tolerance.   Discussed potential metabolic side effects associated with atypical antipsychotics, as well as potential risk for movement side effects. Advised pt to contact office if movement side effects occur.    There are no diagnoses linked to this encounter.   Please see After Visit Summary for patient specific instructions.  No future appointments.  No orders of the defined types were placed in this encounter.     -------------------------------

## 2023-08-21 ENCOUNTER — Encounter (HOSPITAL_COMMUNITY): Payer: Self-pay | Admitting: *Deleted

## 2023-08-21 ENCOUNTER — Emergency Department (HOSPITAL_COMMUNITY)
Admission: EM | Admit: 2023-08-21 | Discharge: 2023-08-22 | Disposition: A | Discharge status: Home / Self Care | Attending: Emergency Medicine | Admitting: Emergency Medicine

## 2023-08-21 ENCOUNTER — Emergency Department (HOSPITAL_COMMUNITY)

## 2023-08-21 ENCOUNTER — Other Ambulatory Visit: Payer: Self-pay

## 2023-08-21 DIAGNOSIS — R1011 Right upper quadrant pain: Secondary | ICD-10-CM | POA: Insufficient documentation

## 2023-08-21 LAB — URINALYSIS, ROUTINE W REFLEX MICROSCOPIC
Bilirubin Urine: NEGATIVE
Glucose, UA: NEGATIVE mg/dL
Hgb urine dipstick: NEGATIVE
Ketones, ur: NEGATIVE mg/dL
Leukocytes,Ua: NEGATIVE
Nitrite: NEGATIVE
Protein, ur: 30 mg/dL — AB
Specific Gravity, Urine: 1.024 (ref 1.005–1.030)
pH: 5 (ref 5.0–8.0)

## 2023-08-21 LAB — RESP PANEL BY RT-PCR (RSV, FLU A&B, COVID)  RVPGX2
Influenza A by PCR: NEGATIVE
Influenza B by PCR: NEGATIVE
Resp Syncytial Virus by PCR: NEGATIVE
SARS Coronavirus 2 by RT PCR: NEGATIVE

## 2023-08-21 LAB — COMPREHENSIVE METABOLIC PANEL
ALT: 21 U/L (ref 0–44)
AST: 21 U/L (ref 15–41)
Albumin: 4.1 g/dL (ref 3.5–5.0)
Alkaline Phosphatase: 76 U/L (ref 38–126)
Anion gap: 10 (ref 5–15)
BUN: 14 mg/dL (ref 6–20)
CO2: 23 mmol/L (ref 22–32)
Calcium: 8.9 mg/dL (ref 8.9–10.3)
Chloride: 102 mmol/L (ref 98–111)
Creatinine, Ser: 0.85 mg/dL (ref 0.44–1.00)
GFR, Estimated: >60 mL/min (ref >60)
Glucose, Bld: 118 mg/dL — ABNORMAL HIGH (ref 70–99)
Potassium: 3.9 mmol/L (ref 3.5–5.1)
Sodium: 135 mmol/L (ref 135–145)
Total Bilirubin: 0.8 mg/dL (ref 0.0–1.2)
Total Protein: 7.4 g/dL (ref 6.5–8.1)

## 2023-08-21 LAB — CBC
HCT: 38.7 % (ref 36.0–46.0)
Hemoglobin: 13 g/dL (ref 12.0–15.0)
MCH: 30.5 pg (ref 26.0–34.0)
MCHC: 33.6 g/dL (ref 30.0–36.0)
MCV: 90.8 fL (ref 80.0–100.0)
Platelets: 400 10*3/uL (ref 150–400)
RBC: 4.26 MIL/uL (ref 3.87–5.11)
RDW: 14.9 % (ref 11.5–15.5)
WBC: 6.2 10*3/uL (ref 4.0–10.5)
nRBC: 0 % (ref 0.0–0.2)

## 2023-08-21 LAB — LIPASE, BLOOD: Lipase: 24 U/L (ref 11–51)

## 2023-08-21 LAB — CBG MONITORING, ED: Glucose-Capillary: 123 mg/dL — ABNORMAL HIGH (ref 70–99)

## 2023-08-21 LAB — PREGNANCY, URINE: Preg Test, Ur: NEGATIVE

## 2023-08-21 MED ORDER — ONDANSETRON HCL 4 MG/2ML IJ SOLN
4.0000 mg | Freq: Once | INTRAMUSCULAR | Status: AC
  Administered 2023-08-21: 4 mg via INTRAVENOUS
  Filled 2023-08-21: qty 2

## 2023-08-21 MED ORDER — HYDROMORPHONE HCL 1 MG/ML IJ SOLN
1.0000 mg | Freq: Once | INTRAMUSCULAR | Status: AC
  Administered 2023-08-21: 1 mg via INTRAVENOUS
  Filled 2023-08-21: qty 1

## 2023-08-21 MED ORDER — IOHEXOL 300 MG/ML  SOLN
100.0000 mL | Freq: Once | INTRAMUSCULAR | Status: AC | PRN
  Administered 2023-08-22: 100 mL via INTRAVENOUS

## 2023-08-21 MED ORDER — SODIUM CHLORIDE 0.9 % IV BOLUS
500.0000 mL | Freq: Once | INTRAVENOUS | Status: AC
  Administered 2023-08-21: 500 mL via INTRAVENOUS

## 2023-08-21 NOTE — ED Triage Notes (Signed)
 Two days right upper abdominal pain, radiates to the back and right shoulder area. Vomiting, diarrhea. Last Zofran just PTA, last Phenergan around 1415. Recent increase in Mounjoro about 1 month ago. Temp 101 today. Last ibuprofen 1700, last tylenol 1400.

## 2023-08-21 NOTE — ED Provider Notes (Signed)
  EMERGENCY DEPARTMENT AT [facility] Provider Note   CSN: [id number] Arrival date & time: 08/21/23  1901     History {Add pertinent medical, surgical, social history, OB history to HPI:1} Chief Complaint  Patient presents with   Abdominal Pain    Sonya Gibson is a 38 y.o. female.  Patient presents to the emergency department for evaluation of abdominal pain.  Patient reports pain has been present for 2 days.  Pain mainly in the right upper abdomen and goes through to her back.  She has had associated nausea, vomiting and diarrhea.       Home Medications Prior to Admission medications   Medication Sig Start Date End Date Taking? Authorizing Provider  acetaminophen (TYLENOL) 325 MG tablet Take 650 mg by mouth every 6 (six) hours as needed.    [provider]  albuterol (VENTOLIN HFA) 108 (90 Base) MCG/ACT inhaler Inhale 2 puffs into the lungs every 6 (six) hours as needed for wheezing or shortness of breath.    [provider]  amphetamine-dextroamphetamine (ADDERALL XR) 30 MG 24 hr capsule TAKE ONE CAPSULE BY MOUTH DAILY 08/04/23   Avelina Laine A, NP  amphetamine-dextroamphetamine (ADDERALL XR) 30 MG 24 hr capsule Take 1 capsule (30 mg total) by mouth daily. 08/08/23   Mozingo, Thereasa Solo, NP  amphetamine-dextroamphetamine (ADDERALL XR) 30 MG 24 hr capsule Take 1 capsule (30 mg total) by mouth daily. 09/05/23   Mozingo, Thereasa Solo, NP  ARIPiprazole (ABILIFY) 2 MG tablet Take 1 tablet (2 mg total) by mouth daily. 08/08/23   Mozingo, Thereasa Solo, NP  botulinum toxin Type A (BOTOX) 200 units injection Provider to inject 155 units into the muscles of the head and neck every 12 weeks. Discard remainder. 10/03/22   Anson Fret, MD  calcium carbonate (TUMS EX) 750 MG chewable tablet Chew 2 tablets by mouth as needed for heartburn.    [provider]  dicyclomine (BENTYL) 20 MG tablet Take 1 tablet (20 mg total) by mouth 2 (two)  times daily. 01/10/23 02/09/23  Maxwell Marion, PA-C  DULoxetine (CYMBALTA) 60 MG capsule Take 2 capsules (120 mg total) by mouth daily. 08/08/23   Mozingo, Thereasa Solo, NP  gabapentin (NEURONTIN) 600 MG tablet Take 1,200 mg by mouth 3 (three) times daily.    [provider]  LORazepam (ATIVAN) 0.5 MG tablet Take 1 tablet (0.5 mg total) by mouth 2 (two) times daily as needed. for anxiety 08/08/23   Mozingo, Thereasa Solo, NP  Melatonin 10 MG TABS Take 10 mg by mouth daily as needed (sleep). Patient not taking: Reported on 02/07/2023    [provider]  methylPREDNISolone (MEDROL DOSEPAK) 4 MG TBPK tablet Take as directed per package instructions 10/17/22   Darrick Grinder, PA-C  MOUNJARO 12.5 MG/0.5ML Pen INJECT 12.5 MG INTO SKIN EVERY 7 DAYS 04/25/22   [provider]  ondansetron (ZOFRAN-ODT) 4 MG disintegrating tablet DISSOLVE ONE TABLET UNDER THE TONGUE EVERY 8 HOURS AS NEEDED FOR NAUSEA 12/28/22   Anson Fret, MD  oxyCODONE (OXYCONTIN) 10 mg 12 hr tablet Take 10 mg by mouth. Takes one tablet 3 times daily    [provider]  oxyCODONE-acetaminophen (PERCOCET/ROXICET) 5-325 MG tablet Take 1 tablet by mouth every 6 (six) hours as needed for moderate pain or severe pain. Patient not taking: Reported on 02/07/2023 06/04/20   Vanetta Mulders, MD  OZEMPIC, 2 MG/DOSE, 8 MG/3ML SOPN Inject into the skin. Patient not taking: Reported on 02/07/2023  [provider]  promethazine (PHENERGAN) 25 MG tablet Take 1 tablet (25 mg total) by mouth every 6 (six) hours as needed for nausea or vomiting. 01/10/23 02/09/23  Maxwell Marion, PA-C  QUEtiapine (SEROQUEL) 100 MG tablet Take 1 tablet (100 mg total) by mouth at bedtime. 08/08/23   Mozingo, Thereasa Solo, NP  Rimegepant Sulfate (NURTEC) 75 MG TBDP Take 75 mg by mouth daily as needed. For migraines. Take as close to onset of migraine as possible. One daily maximum. 05/09/22   Anson Fret, MD  rizatriptan  (MAXALT-MLT) 10 MG disintegrating tablet Take 1 tablet (10 mg total) by mouth as needed for migraine. May repeat in 2 hours if needed Patient not taking: Reported on 02/07/2023 05/12/21   Anson Fret, MD  traZODone (DESYREL) 150 MG tablet Take 1 tablet (150 mg total) by mouth at bedtime. 08/08/23   Mozingo, Thereasa Solo, NP  levETIRAcetam (KEPPRA) 500 MG tablet Take 1 tablet (500 mg total) by mouth 2 (two) times daily. 07/10/18 07/12/19  Catarina Hartshorn, MD  metoprolol tartrate (LOPRESSOR) 25 MG tablet Take 0.5 tablets (12.5 mg total) by mouth 2 (two) times daily. 07/10/18 06/02/19  Catarina Hartshorn, MD  venlafaxine XR (EFFEXOR-XR) 75 MG 24 hr capsule Take 75 mg by mouth at bedtime.  12/12/17 06/02/19  [provider]      Allergies    Cinnamon, Clindamycin, Coconut (cocos nucifera), Lovenox [enoxaparin], Nystatin, Aspartame, Niacin, Nitrofurantoin, Other, and Pregabalin    Review of Systems   Review of Systems  Physical Exam Updated Vital Signs BP (!) 135/99 (BP Location: Right Arm)   Pulse (!) 101   Temp 98.1 F (36.7 C) (Oral)   Resp 18   LMP 07/30/2023   SpO2 98%  Physical Exam Vitals and nursing note reviewed.  Constitutional:      General: She is not in acute distress.    Appearance: She is well-developed.  HENT:     Head: Normocephalic and atraumatic.     Mouth/Throat:     Mouth: Mucous membranes are moist.  Eyes:     General: Vision grossly intact. Gaze aligned appropriately.     Extraocular Movements: Extraocular movements intact.     Conjunctiva/sclera: Conjunctivae normal.  Cardiovascular:     Rate and Rhythm: Normal rate and regular rhythm.     Pulses: Normal pulses.     Heart sounds: Normal heart sounds, S1 normal and S2 normal. No murmur heard.    No friction rub. No gallop.  Pulmonary:     Effort: Pulmonary effort is normal. No respiratory distress.     Breath sounds: Normal breath sounds.  Abdominal:     General: Bowel sounds are normal.     Palpations:  Abdomen is soft.     Tenderness: There is abdominal tenderness in the right upper quadrant. There is no guarding or rebound.     Hernia: No hernia is present.  Musculoskeletal:        General: No swelling.     Cervical back: Full passive range of motion without pain, normal range of motion and neck supple. No spinous process tenderness or muscular tenderness. Normal range of motion.     Right lower leg: No edema.     Left lower leg: No edema.  Skin:    General: Skin is warm and dry.     Capillary Refill: Capillary refill takes less than 2 seconds.     Findings: No ecchymosis, erythema, rash or wound.  Neurological:  General: No focal deficit present.     Mental Status: She is alert and oriented to person, place, and time.     GCS: GCS eye subscore is 4. GCS verbal subscore is 5. GCS motor subscore is 6.     Cranial Nerves: Cranial nerves 2-12 are intact.     Sensory: Sensation is intact.     Motor: Motor function is intact.     Coordination: Coordination is intact.  Psychiatric:        Attention and Perception: Attention normal.        Mood and Affect: Mood normal.        Speech: Speech normal.        Behavior: Behavior normal.     ED Results / Procedures / Treatments   Labs (all labs ordered are listed, but only abnormal results are displayed) Labs Reviewed  COMPREHENSIVE METABOLIC PANEL - Abnormal; Notable for the following components:      Result Value   Glucose, Bld 118 (*)    All other components within normal limits  URINALYSIS, ROUTINE W REFLEX MICROSCOPIC - Abnormal; Notable for the following components:   Color, Urine AMBER (*)    APPearance HAZY (*)    Protein, ur 30 (*)    Bacteria, UA FEW (*)    All other components within normal limits  CBG MONITORING, ED - Abnormal; Notable for the following components:   Glucose-Capillary 123 (*)    All other components within normal limits  RESP PANEL BY RT-PCR (RSV, FLU A&B, COVID)  RVPGX2  LIPASE, BLOOD  CBC   PREGNANCY, URINE    EKG None  Radiology No results found.  Procedures Procedures  {Document cardiac monitor, telemetry assessment procedure when appropriate:1}  Medications Ordered in ED Medications  sodium chloride 0.9 % bolus 500 mL (has no administration in time range)  HYDROmorphone (DILAUDID) injection 1 mg (has no administration in time range)  ondansetron (ZOFRAN) injection 4 mg (has no administration in time range)  iohexol (OMNIPAQUE) 300 MG/ML solution 100 mL (has no administration in time range)    ED Course/ Medical Decision Making/ A&P   {   Click here for ABCD2, HEART and other calculatorsREFRESH Note before signing :1}                              Medical Decision Making Amount and/or Complexity of Data Reviewed Labs: ordered. Radiology: ordered.  Risk Prescription drug management.   ***  {Document critical care time when appropriate:1} {Document review of labs and clinical decision tools ie heart score, Chads2Vasc2 etc:1}  {Document your independent review of radiology images, and any outside records:1} {Document your discussion with family members, caretakers, and with consultants:1} {Document social determinants of health affecting pt's care:1} {Document your decision making why or why not admission, treatments were needed:1} Final Clinical Impression(s) / ED Diagnoses Final diagnoses:  None    Rx / DC Orders ED Discharge Orders     None

## 2023-08-22 ENCOUNTER — Emergency Department (HOSPITAL_COMMUNITY)

## 2023-08-22 MED ORDER — ONDANSETRON HCL 4 MG/2ML IJ SOLN
4.0000 mg | Freq: Four times a day (QID) | INTRAMUSCULAR | Status: DC | PRN
  Administered 2023-08-22: 4 mg via INTRAVENOUS
  Filled 2023-08-22 (×2): qty 2

## 2023-08-22 MED ORDER — HYDROMORPHONE HCL 1 MG/ML IJ SOLN
1.0000 mg | Freq: Once | INTRAMUSCULAR | Status: AC
  Administered 2023-08-22: 1 mg via INTRAVENOUS
  Filled 2023-08-22: qty 1

## 2023-08-22 MED ORDER — ALUM & MAG HYDROXIDE-SIMETH 200-200-20 MG/5ML PO SUSP
30.0000 mL | Freq: Once | ORAL | Status: AC
  Administered 2023-08-22: 30 mL via ORAL
  Filled 2023-08-22: qty 30

## 2023-08-22 MED ORDER — KETOROLAC TROMETHAMINE 30 MG/ML IJ SOLN
15.0000 mg | Freq: Once | INTRAMUSCULAR | Status: AC
  Administered 2023-08-22: 15 mg via INTRAVENOUS
  Filled 2023-08-22: qty 1

## 2023-08-22 MED ORDER — FAMOTIDINE IN NACL 20-0.9 MG/50ML-% IV SOLN
20.0000 mg | Freq: Once | INTRAVENOUS | Status: AC
  Administered 2023-08-22: 20 mg via INTRAVENOUS
  Filled 2023-08-22: qty 50

## 2023-08-22 MED ORDER — HYDROMORPHONE HCL 1 MG/ML IJ SOLN
1.0000 mg | INTRAMUSCULAR | Status: DC | PRN
  Administered 2023-08-22 (×2): 1 mg via INTRAVENOUS
  Filled 2023-08-22 (×2): qty 1

## 2023-08-22 NOTE — ED Provider Notes (Signed)
 Signout from Dr. Oletta Cohn.  38 year old female with right upper quadrant pain.  Reportedly has history of biliary dysfunction.  Lab work and CT unremarkable.  She is pending a right upper quadrant ultrasound.  Disposition per results of ultrasound Physical Exam  BP (!) 143/98 (BP Location: Right Arm)   Pulse 88   Temp 97.7 F (36.5 C) (Oral)   Resp 17   LMP 07/30/2023   SpO2 95%   Physical Exam  Procedures  Procedures  ED Course / MDM    Medical Decision Making Amount and/or Complexity of Data Reviewed Labs: ordered. Radiology: ordered.  Risk OTC drugs. Prescription drug management.   Patient's ultrasound does not show any acute findings.  I reviewed this with her.  Will give her contact information for GI follow-up.  She is also going to reach out to her surgeon that put in the stimulator in her primary care doctor.  She is on chronic narcotics, asking for a dose before she leaves so she can get to her home medication.       Terrilee Files, MD 08/22/23 [phone]

## 2023-08-22 NOTE — ED Notes (Signed)
Patient called out stating she was in pain, provider made aware.

## 2023-09-12 ENCOUNTER — Encounter: Payer: Self-pay | Admitting: Adult Health

## 2023-09-12 ENCOUNTER — Encounter: Payer: Self-pay | Admitting: *Deleted

## 2023-09-12 ENCOUNTER — Telehealth: Payer: BC Managed Care – PPO | Admitting: Adult Health

## 2023-09-12 DIAGNOSIS — F4310 Post-traumatic stress disorder, unspecified: Secondary | ICD-10-CM | POA: Diagnosis not present

## 2023-09-12 DIAGNOSIS — F422 Mixed obsessional thoughts and acts: Secondary | ICD-10-CM

## 2023-09-12 DIAGNOSIS — G4700 Insomnia, unspecified: Secondary | ICD-10-CM

## 2023-09-12 DIAGNOSIS — F411 Generalized anxiety disorder: Secondary | ICD-10-CM | POA: Diagnosis not present

## 2023-09-12 DIAGNOSIS — F902 Attention-deficit hyperactivity disorder, combined type: Secondary | ICD-10-CM

## 2023-09-12 MED ORDER — DULOXETINE HCL 60 MG PO CPEP: 120.0000 mg | ORAL_CAPSULE | Freq: Every day | ORAL | 5 refills | Status: DC

## 2023-09-12 MED ORDER — ARIPIPRAZOLE 2 MG PO TABS: 2.0000 mg | ORAL_TABLET | Freq: Two times a day (BID) | ORAL | 2 refills | Status: DC

## 2023-09-12 MED ORDER — AMPHETAMINE-DEXTROAMPHET ER 30 MG PO CP24
30.0000 mg | ORAL_CAPSULE | Freq: Every day | ORAL | 0 refills | Status: DC
Start: 2023-09-12 — End: 2023-12-26

## 2023-09-12 MED ORDER — TRAZODONE HCL 150 MG PO TABS: 150.0000 mg | ORAL_TABLET | Freq: Every day | ORAL | 5 refills | Status: DC

## 2023-09-12 MED ORDER — AMPHETAMINE-DEXTROAMPHET ER 30 MG PO CP24
30.0000 mg | ORAL_CAPSULE | Freq: Every day | ORAL | 0 refills | Status: DC
Start: 2023-11-07 — End: 2023-12-26

## 2023-09-12 NOTE — Progress Notes (Signed)
 Sonya Gibson [id number] 1986-05-29 38 y.o.  Virtual Visit via Video Note  I connected with pt @ on 09/12/23 at  2:00 PM EDT by a video enabled telemedicine application and verified that I am speaking with the correct person using two identifiers.   I discussed the limitations of evaluation and management by telemedicine and the availability of in person appointments. The patient expressed understanding and agreed to proceed.  I discussed the assessment and treatment plan with the patient. The patient was provided an opportunity to ask questions and all were answered. The patient agreed with the plan and demonstrated an understanding of the instructions.   The patient was advised to call back or seek an in-person evaluation if the symptoms worsen or if the condition fails to improve as anticipated.  I provided 25 minutes of non-face-to-face time during this encounter.  The patient was located at home.  The provider was located at [facility].   Dorothyann Gibbs, NP   Subjective:   Patient ID:  Sonya Gibson is a 38 y.o. (DOB 08/09/1985) female.  Chief Complaint: No chief complaint on file.   HPI MONCERRAT BURNSTEIN presents for follow-up of PTSD, panic attacks, obsessional thoughts and acts, depression, ADD - combined type and bipolar disorder - 2.  Describes mood today as "not the best". Pleasant. Reports tearfulness. Mood symptoms - reports a "little" decrease in depression and anxiety.  Reports increased interest and motivation. Reports irritability - "one minor thing can set me off". Reports panic attacks - "violently there". Reports flash backs - "going through her mom's things". Reports outbursts - "at least twice a week". Reports worry, rumination and over thinking - "sun up to sun down" - "maybe that's just who I am". Reports obsessive thoughts and acts. Reports physical limitations and disabilities. Reports mood has improved. Stating "I feel like I'm doing a little better  with the Abilify". Taking medications as prescribed.  Appetite adequate. Weight loss with - 251 from 268 pounds - [place] - A1C improved. Reports sleeping well most nights. Averages 7 to 9 hours. Reports focus and concentration difficulties - "still all over the place". Reports taking Adderall XR 30mg  daily. Diagnosed with ADD in childhood. Completing tasks. Managing aspects of household. Reports being approved for LTD through her employer. Plans to continue to pursue SSD benefits. Denies SI or HI.  Denies AH. Denies VH. Denies self harm. Denies substance use.   Previous medication trials:  Buspar, Hydroxyzine.   Review of Systems:  Review of Systems  Musculoskeletal:  Negative for gait problem.  Neurological:  Negative for tremors.  Psychiatric/Behavioral:         Please refer to HPI    Medications: I have reviewed the patient's current medications.  Current Outpatient Medications  Medication Sig Dispense Refill   acetaminophen (TYLENOL) 325 MG tablet Take 650 mg by mouth every 6 (six) hours as needed.     albuterol (VENTOLIN HFA) 108 (90 Base) MCG/ACT inhaler Inhale 2 puffs into the lungs every 6 (six) hours as needed for wheezing or shortness of breath.     amphetamine-dextroamphetamine (ADDERALL XR) 30 MG 24 hr capsule Take 1 capsule (30 mg total) by mouth daily. (Patient not taking: Reported on 08/22/2023) 30 capsule 0   amphetamine-dextroamphetamine (ADDERALL XR) 30 MG 24 hr capsule Take 1 capsule (30 mg total) by mouth daily. 30 capsule 0   [START ON 11/07/2023] amphetamine-dextroamphetamine (ADDERALL XR) 30 MG 24 hr capsule Take 1 capsule (30 mg total) by  mouth daily. 30 capsule 0   ARIPiprazole (ABILIFY) 2 MG tablet Take 1 tablet (2 mg total) by mouth 2 (two) times daily. 60 tablet 2   botulinum toxin Type A (BOTOX) 200 units injection Provider to inject 155 units into the muscles of the head and neck every 12 weeks. Discard remainder. 1 each 3   calcium carbonate (TUMS EX) 750  MG chewable tablet Chew 2 tablets by mouth as needed for heartburn.     celecoxib (CELEBREX) 200 MG capsule Take 200 mg by mouth daily.     CHERRY PO Take 1 tablet by mouth daily.     DULoxetine (CYMBALTA) 60 MG capsule Take 2 capsules (120 mg total) by mouth daily. 60 capsule 5   fluconazole (DIFLUCAN) 150 MG tablet Take 150 mg by mouth once.     gabapentin (NEURONTIN) 600 MG tablet Take 1,200 mg by mouth 3 (three) times daily.     LORazepam (ATIVAN) 0.5 MG tablet Take 1 tablet (0.5 mg total) by mouth 2 (two) times daily as needed. for anxiety 60 tablet 0   MOUNJARO 10 MG/0.5ML Pen SMARTSIG:10 Milligram(s) SUB-Q Once a Week     naloxone (NARCAN) nasal spray 4 mg/0.1 mL Place into the nose.     ondansetron (ZOFRAN-ODT) 4 MG disintegrating tablet DISSOLVE ONE TABLET UNDER THE TONGUE EVERY 8 HOURS AS NEEDED FOR NAUSEA 30 tablet 0   oxyCODONE (OXYCONTIN) 10 mg 12 hr tablet Take 10 mg by mouth every 12 (twelve) hours.     promethazine (PHENERGAN) 25 MG tablet Take 1 tablet (25 mg total) by mouth every 6 (six) hours as needed for nausea or vomiting. 120 tablet 0   QUEtiapine (SEROQUEL) 100 MG tablet Take 1 tablet (100 mg total) by mouth at bedtime. 30 tablet 5   Rimegepant Sulfate (NURTEC) 75 MG TBDP Take 75 mg by mouth daily as needed. For migraines. Take as close to onset of migraine as possible. One daily maximum. 10 tablet 6   rizatriptan (MAXALT-MLT) 10 MG disintegrating tablet Take 1 tablet (10 mg total) by mouth as needed for migraine. May repeat in 2 hours if needed 9 tablet 11   tiZANidine (ZANAFLEX) 4 MG tablet Take 1.5 tablets (6 mg total) by mouth every 8 (eight) hours.     traMADol (ULTRAM-ER) 300 MG 24 hr tablet Take 300 mg by mouth daily.     traZODone (DESYREL) 150 MG tablet Take 1 tablet (150 mg total) by mouth at bedtime. 30 tablet 5   No current facility-administered medications for this visit.    Medication Side Effects: None  Allergies:  Allergies  Allergen Reactions    Cinnamon Anaphylaxis    (NO "REAL"/TREE BARK CINNAMON)   Clindamycin Nausea And Vomiting    Other reaction(s): gi distress Chest pain, trouble swallowing Chest pain, trouble swallowing    Coconut (Cocos Nucifera) Anaphylaxis    Can eat "fake" coconut   Lovenox [Enoxaparin] Other (See Comments)    BROKE DOWN THE SKIN AT INJECTION SITE AND CAUSED A WOUND   Nystatin Anaphylaxis   Aspartame     Other reaction(s): Other (See Comments) "Breathing Difficulty, Serious intense migraines".  "Breathing Difficulty, Serious intense migraines".     Niacin Hives   Nitrofurantoin Hives   Other     "Headache cocktail"   Blister to abd   Pregabalin     Other reaction(s): Other (See Comments) Depression with suicidal thoughts Depression with suicidal thoughts     Past Medical History:  Diagnosis Date  Anxiety    Chronic back pain    Depression    Diabetes (HCC)    Drug-seeking behavior    Essential hypertension    GERD (gastroesophageal reflux disease)    Hypothyroidism    Kidney stone    Lumbar radiculopathy, right    Migraine headache    PTSD (post-traumatic stress disorder)     Family History  Problem Relation Age of Onset   Diabetes Mellitus II Mother    Hypertension Mother    Seizures Mother    Other Mother        post stroke headaches   Stroke Mother    Hypertension Father    Diabetes Mellitus II Father    Hyperlipidemia Father    Bipolar disorder Sister    Asthma Son    Autism Son    Tourette syndrome Son    Autism Daughter        level 3   Other Daughter        tibia torsion & femoral aniversion    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    Highest education level: Not on file  Occupational History   Not on file  Tobacco Use   Smoking status: Former    Current packs/day: 0.00    Average packs/day: 2.0 packs/day for 3.0 years (6.0 ttl  pk-yrs)    Types: Cigarettes    Start date: 2003    Quit date: 2006    Years since quitting: 19.2   Smokeless tobacco: Never  Vaping Use   Vaping status: Never Used  Substance and Sexual Activity   Alcohol use: Yes    Comment: socially   Drug use: No   Sexual activity: Yes    Birth control/protection: None  Other Topics Concern   Not on file  Social History Narrative   Lives at home with husband and kids   Right handed   Caffeine: 6 cups daily   Social Drivers of Health   Financial Resource Strain: Low Risk  (01/03/2022)   Received from [facility], [facility]   Overall Financial Resource Strain (CARDIA)    Difficulty of Paying Living Expenses: Not very hard  Food Insecurity: No Food Insecurity (01/03/2022)   Received from [facility], [facility]   Hunger Vital Sign    Worried About Running Out of Food in the Last Year: Never true    Ran Out of Food in the Last Year: Never true  Transportation Needs: No Transportation Needs (01/03/2022)   Received from [facility], [facility]   PRAPARE - Transportation    Lack of Transportation (Medical): No    Lack of Transportation (Non-Medical): No  Physical Activity: Not on file  Stress: Not on file  Social Connections: Not on file  Intimate Partner Violence: Not on file    Past Medical History, Surgical history, Social history, and Family history were reviewed and updated as appropriate.   Please see review of systems for further details on the patient's review from today.   Objective:   Physical Exam:  LMP 07/30/2023   Physical Exam Constitutional:      General: She is not in acute distress. Musculoskeletal:        General: No deformity.  Neurological:     Mental Status: She is alert and oriented to  person, place, and time.     Coordination: Coordination normal.  Psychiatric:        Attention and Perception: Attention and perception normal. She does not perceive auditory or visual  hallucinations.        Mood and Affect: Affect is not labile, blunt, angry or inappropriate.        Speech: Speech normal.        Behavior: Behavior normal.        Thought Content: Thought content normal. Thought content is not paranoid or delusional. Thought content does not include homicidal or suicidal ideation. Thought content does not include homicidal or suicidal plan.        Cognition and Memory: Cognition and memory normal.        Judgment: Judgment normal.     Comments: Insight intact     Lab Review:     Component Value Date/Time   NA 135 08/21/2023 1948   K 3.9 08/21/2023 1948   CL 102 08/21/2023 1948   CO2 23 08/21/2023 1948   GLUCOSE 118 (H) 08/21/2023 1948   BUN 14 08/21/2023 1948   CREATININE 0.85 08/21/2023 1948   CALCIUM 8.9 08/21/2023 1948   PROT 7.4 08/21/2023 1948   ALBUMIN 4.1 08/21/2023 1948   AST 21 08/21/2023 1948   ALT 21 08/21/2023 1948   ALKPHOS 76 08/21/2023 1948   BILITOT 0.8 08/21/2023 1948   GFRNONAA >60 08/21/2023 1948   GFRAA >60 02/21/2020 1033       Component Value Date/Time   WBC 6.2 08/21/2023 1948   RBC 4.26 08/21/2023 1948   HGB 13.0 08/21/2023 1948   HCT 38.7 08/21/2023 1948   PLT 400 08/21/2023 1948   MCV 90.8 08/21/2023 1948   MCH 30.5 08/21/2023 1948   MCHC 33.6 08/21/2023 1948   RDW 14.9 08/21/2023 1948   LYMPHSABS 4.1 (H) 10/17/2022 1747   MONOABS 0.4 10/17/2022 1747   EOSABS 0.2 10/17/2022 1747   BASOSABS 0.1 10/17/2022 1747    No results found for: "POCLITH", "LITHIUM"   No results found for: "PHENYTOIN", "PHENOBARB", "VALPROATE", "CBMZ"   .res Assessment: Plan:    Plan:  PDMP reviewed  Increase Abilify 2mg  daily to 2mg  twice daily for mood  Seroquel 100mg  at hs for sleep Trazadone 150mg  at bedtime - typically 75mg  at hs  Adderall XR 30mg  every morning  Cymbalta 60mg  - 2 daily  Lorazepam 0.5mg  daily prn panic attacks - does not take every day.  Recent labs WNL  Monitor BP between visits while taking  stimulant medication.   RTC 8 weeks  25 minutes spent dedicated to the care of this patient on the date of this encounter to include pre-visit review of records, ordering of medication, post visit documentation, and face-to-face time with the patient discussing PTSD, panic attacks, obsessional thoughts and acts, depression, ADD - combined type and bipolar disorder - 2. Discussed continuing current medication regimen.  Patient advised to contact office with any questions, adverse effects, or acute worsening in signs and symptoms.  Discussed potential benefits, risk, and side effects of benzodiazepines to include potential risk of tolerance and dependence, as well as possible drowsiness.  Advised patient not to drive if experiencing drowsiness and to take lowest possible effective dose to minimize risk of dependence and tolerance.   Discussed potential metabolic side effects associated with atypical antipsychotics, as well as potential risk for movement side effects. Advised pt to contact office if movement side effects occur.    Diagnoses and all  orders for this visit:  Attention deficit hyperactivity disorder (ADHD), combined type -     amphetamine-dextroamphetamine (ADDERALL XR) 30 MG 24 hr capsule; Take 1 capsule (30 mg total) by mouth daily. -     amphetamine-dextroamphetamine (ADDERALL XR) 30 MG 24 hr capsule; Take 1 capsule (30 mg total) by mouth daily.  Generalized anxiety disorder -     ARIPiprazole (ABILIFY) 2 MG tablet; Take 1 tablet (2 mg total) by mouth 2 (two) times daily. -     DULoxetine (CYMBALTA) 60 MG capsule; Take 2 capsules (120 mg total) by mouth daily.  PTSD (post-traumatic stress disorder) -     DULoxetine (CYMBALTA) 60 MG capsule; Take 2 capsules (120 mg total) by mouth daily.  Mixed obsessional thoughts and acts -     DULoxetine (CYMBALTA) 60 MG capsule; Take 2 capsules (120 mg total) by mouth daily.  Insomnia, unspecified type -     traZODone (DESYREL) 150 MG  tablet; Take 1 tablet (150 mg total) by mouth at bedtime.     Please see After Visit Summary for patient specific instructions.  No future appointments.   No orders of the defined types were placed in this encounter.     -------------------------------

## 2023-10-05 ENCOUNTER — Other Ambulatory Visit: Payer: Self-pay

## 2023-10-10 ENCOUNTER — Other Ambulatory Visit: Payer: Self-pay | Admitting: Adult Health

## 2023-10-10 DIAGNOSIS — F410 Panic disorder [episodic paroxysmal anxiety] without agoraphobia: Secondary | ICD-10-CM

## 2023-10-11 ENCOUNTER — Other Ambulatory Visit: Payer: Self-pay

## 2023-10-12 ENCOUNTER — Telehealth (INDEPENDENT_AMBULATORY_CARE_PROVIDER_SITE_OTHER): Payer: Self-pay | Admitting: Adult Health

## 2023-10-12 ENCOUNTER — Other Ambulatory Visit: Payer: Self-pay

## 2023-10-12 DIAGNOSIS — Z0389 Encounter for observation for other suspected diseases and conditions ruled out: Secondary | ICD-10-CM

## 2023-10-12 NOTE — Progress Notes (Signed)
Patient no show appointment. Called and LVM.  

## 2023-10-12 NOTE — Progress Notes (Deleted)
 Sonya Gibson [id number] Aug 19, 1985 38 y.o.  Virtual Visit via Video Note  I connected with pt @ on 10/12/23 at  1:00 PM EDT by a video enabled telemedicine application and verified that I am speaking with the correct person using two identifiers.   I discussed the limitations of evaluation and management by telemedicine and the availability of in person appointments. The patient expressed understanding and agreed to proceed.  I discussed the assessment and treatment plan with the patient. The patient was provided an opportunity to ask questions and all were answered. The patient agreed with the plan and demonstrated an understanding of the instructions.   The patient was advised to call back or seek an in-person evaluation if the symptoms worsen or if the condition fails to improve as anticipated.  I provided *** minutes of non-face-to-face time during this encounter.  The patient was located at home.  The provider was located at [facility].   Reagan Camera, NP   Subjective:   Patient ID:  Sonya Gibson is a 38 y.o. (DOB May 14, 1986) female.  Chief Complaint: No chief complaint on file.   HPI Sonya Gibson presents for follow-up of PTSD, panic attacks, obsessional thoughts and acts, depression, ADD - combined type and bipolar disorder - 2.  Describes mood today as "not the best". Pleasant. Reports tearfulness. Mood symptoms - reports a "little" decrease in depression and anxiety.  Reports increased interest and motivation. Reports irritability - "one minor thing can set me off". Reports panic attacks - "violently there". Reports flash backs - "going through her mom's things". Reports outbursts - "at least twice a week". Reports worry, rumination and over thinking - "sun up to sun down" - "maybe that's just who I am". Reports obsessive thoughts and acts. Reports physical limitations and disabilities. Reports mood has improved. Stating "I feel like I'm doing a little better  with the Abilify ". Taking medications as prescribed.  Appetite adequate. Weight loss with - 251 from 268 pounds - [place] - A1C improved. Reports sleeping well most nights. Averages 7 to 9 hours. Reports focus and concentration difficulties - "still all over the place". Reports taking Adderall XR 30mg  daily. Diagnosed with ADD in childhood. Completing tasks. Managing aspects of household. Reports being approved for LTD through her employer. Plans to continue to pursue SSD benefits. Denies SI or HI.  Denies AH. Denies VH. Denies self harm. Denies substance use.   Previous medication trials:  Buspar, Hydroxyzine.   Review of Systems:  Review of Systems  Medications: {medication reviewed/display:3041432}  Current Outpatient Medications  Medication Sig Dispense Refill   acetaminophen  (TYLENOL ) 325 MG tablet Take 650 mg by mouth every 6 (six) hours as needed.     albuterol  (VENTOLIN  HFA) 108 (90 Base) MCG/ACT inhaler Inhale 2 puffs into the lungs every 6 (six) hours as needed for wheezing or shortness of breath.     amphetamine -dextroamphetamine  (ADDERALL XR) 30 MG 24 hr capsule Take 1 capsule (30 mg total) by mouth daily. (Patient not taking: Reported on 08/22/2023) 30 capsule 0   amphetamine -dextroamphetamine  (ADDERALL XR) 30 MG 24 hr capsule Take 1 capsule (30 mg total) by mouth daily. 30 capsule 0   [START ON 11/07/2023] amphetamine -dextroamphetamine  (ADDERALL XR) 30 MG 24 hr capsule Take 1 capsule (30 mg total) by mouth daily. 30 capsule 0   ARIPiprazole  (ABILIFY ) 2 MG tablet Take 1 tablet (2 mg total) by mouth 2 (two) times daily. 60 tablet 2   botulinum toxin Type A  (BOTOX )  200 units injection Provider to inject 155 units into the muscles of the head and neck every 12 weeks. Discard remainder. 1 each 3   calcium  carbonate (TUMS EX) 750 MG chewable tablet Chew 2 tablets by mouth as needed for heartburn.     celecoxib  (CELEBREX ) 200 MG capsule Take 200 mg by mouth daily.     CHERRY PO Take  1 tablet by mouth daily.     DULoxetine  (CYMBALTA ) 60 MG capsule Take 2 capsules (120 mg total) by mouth daily. 60 capsule 5   fluconazole  (DIFLUCAN ) 150 MG tablet Take 150 mg by mouth once.     gabapentin  (NEURONTIN ) 600 MG tablet Take 1,200 mg by mouth 3 (three) times daily.     LORazepam  (ATIVAN ) 0.5 MG tablet TAKE 1 TABLET BY MOUTH TWICE DAILY AS NEEDED FOR ANXIETY. 60 tablet 0   MOUNJARO 10 MG/0.5ML Pen SMARTSIG:10 Milligram(s) SUB-Q Once a Week     naloxone  (NARCAN ) nasal spray 4 mg/0.1 mL Place into the nose.     ondansetron  (ZOFRAN -ODT) 4 MG disintegrating tablet DISSOLVE ONE TABLET UNDER THE TONGUE EVERY 8 HOURS AS NEEDED FOR NAUSEA 30 tablet 0   oxyCODONE  (OXYCONTIN ) 10 mg 12 hr tablet Take 10 mg by mouth every 12 (twelve) hours.     promethazine  (PHENERGAN ) 25 MG tablet Take 1 tablet (25 mg total) by mouth every 6 (six) hours as needed for nausea or vomiting. 120 tablet 0   QUEtiapine  (SEROQUEL ) 100 MG tablet Take 1 tablet (100 mg total) by mouth at bedtime. 30 tablet 5   Rimegepant Sulfate (NURTEC) 75 MG TBDP Take 75 mg by mouth daily as needed. For migraines. Take as close to onset of migraine as possible. One daily maximum. 10 tablet 6   rizatriptan  (MAXALT -MLT) 10 MG disintegrating tablet Take 1 tablet (10 mg total) by mouth as needed for migraine. May repeat in 2 hours if needed 9 tablet 11   tiZANidine  (ZANAFLEX ) 4 MG tablet Take 1.5 tablets (6 mg total) by mouth every 8 (eight) hours.     traMADol (ULTRAM-ER) 300 MG 24 hr tablet Take 300 mg by mouth daily.     traZODone  (DESYREL ) 150 MG tablet Take 1 tablet (150 mg total) by mouth at bedtime. 30 tablet 5   No current facility-administered medications for this visit.    Medication Side Effects: {Medication Side Effects (Optional):21014029}  Allergies:  Allergies  Allergen Reactions   Cinnamon Anaphylaxis    (NO "REAL"/TREE BARK CINNAMON)   Clindamycin Nausea And Vomiting    Other reaction(s): gi distress Chest pain,  trouble swallowing Chest pain, trouble swallowing    Coconut (Cocos Nucifera) Anaphylaxis    Can eat "fake" coconut   Lovenox  [Enoxaparin ] Other (See Comments)    BROKE DOWN THE SKIN AT INJECTION SITE AND CAUSED A WOUND   Nystatin Anaphylaxis   Aspartame     Other reaction(s): Other (See Comments) "Breathing Difficulty, Serious intense migraines".  "Breathing Difficulty, Serious intense migraines".     Niacin Hives   Nitrofurantoin Hives   Other     "Headache cocktail"   Blister to abd   Pregabalin     Other reaction(s): Other (See Comments) Depression with suicidal thoughts Depression with suicidal thoughts     Past Medical History:  Diagnosis Date   Anxiety    Chronic back pain    Depression    Diabetes (HCC)    Drug-seeking behavior    Essential hypertension    GERD (gastroesophageal reflux disease)  Hypothyroidism    Kidney stone    Lumbar radiculopathy, right    Migraine headache    PTSD (post-traumatic stress disorder)     Family History  Problem Relation Age of Onset   Diabetes Mellitus II Mother    Hypertension Mother    Seizures Mother    Other Mother        post stroke headaches   Stroke Mother    Hypertension Father    Diabetes Mellitus II Father    Hyperlipidemia Father    Bipolar disorder Sister    Asthma Son    Autism Son    Tourette syndrome Son    Autism Daughter        level 3   Other Daughter        tibia torsion & femoral aniversion    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    Highest education level: Not on file  Occupational History   Not on file  Tobacco Use   Smoking status: Former    Current packs/day: 0.00    Average packs/day: 2.0 packs/day for 3.0 years (6.0 ttl pk-yrs)    Types: Cigarettes    Start date: 2003    Quit date: 2006    Years since quitting: 19.3   Smokeless tobacco: Never  Vaping Use    Vaping status: Never Used  Substance and Sexual Activity   Alcohol use: Yes    Comment: socially   Drug use: No   Sexual activity: Yes    Birth control/protection: None  Other Topics Concern   Not on file  Social History Narrative   Lives at home with husband and kids   Right handed   Caffeine : 6 cups daily   Social Drivers of Health   Financial Resource Strain: Low Risk  (01/03/2022)   Received from [facility]   Overall Financial Resource Strain (CARDIA)    Difficulty of Paying Living Expenses: Not very hard  Food Insecurity: No Food Insecurity (01/03/2022)   Received from [facility], [facility]   Hunger Vital Sign    Worried About Running Out of Food in the Last Year: Never true    Ran Out of Food in the Last Year: Never true  Transportation Needs: No Transportation Needs (01/03/2022)   [facility], [facility]   PRAPARE - Transportation    Lack of Transportation (Medical): No    Lack of Transportation (Non-Medical): No  Physical Activity: Not on file  Stress: Not on file  Social Connections: Not on file  Intimate Partner Violence: Not on file    Past Medical History, Surgical history, Social history, and Family history were reviewed and updated as appropriate.   Please see review of systems for further details on the patient's review from today.   Objective:   Physical Exam:  There were no vitals taken for this visit.  Physical Exam  Lab Review:     Component Value Date/Time   NA 135 08/21/2023 1948   K 3.9 08/21/2023 1948   CL 102 08/21/2023 1948   CO2 23 08/21/2023 1948   GLUCOSE 118 (H) 08/21/2023 1948   BUN 14 08/21/2023 1948   CREATININE 0.85 08/21/2023 1948   CALCIUM  8.9 08/21/2023 1948   PROT 7.4 08/21/2023 1948   ALBUMIN 4.1  08/21/2023 1948   AST 21 08/21/2023 1948   ALT 21 08/21/2023 1948   ALKPHOS 76 08/21/2023 1948   BILITOT 0.8 08/21/2023 1948   GFRNONAA >60 08/21/2023 1948   GFRAA >60  02/21/2020 1033       Component Value Date/Time   WBC 6.2 08/21/2023 1948   RBC 4.26 08/21/2023 1948   HGB 13.0 08/21/2023 1948   HCT 38.7 08/21/2023 1948   PLT 400 08/21/2023 1948   MCV 90.8 08/21/2023 1948   MCH 30.5 08/21/2023 1948   MCHC 33.6 08/21/2023 1948   RDW 14.9 08/21/2023 1948   LYMPHSABS 4.1 (H) 10/17/2022 1747   MONOABS 0.4 10/17/2022 1747   EOSABS 0.2 10/17/2022 1747   BASOSABS 0.1 10/17/2022 1747    No results found for: "POCLITH", "LITHIUM"   No results found for: "PHENYTOIN", "PHENOBARB", "VALPROATE", "CBMZ"   .res Assessment: Plan:   Plan:  PDMP reviewed  Increase Abilify  2mg  daily to 2mg  twice daily for mood  Seroquel  100mg  at hs for sleep Trazadone 150mg  at bedtime - typically 75mg  at hs  Adderall XR 30mg  every morning  Cymbalta  60mg  - 2 daily  Lorazepam  0.5mg  daily prn panic attacks - does not take every day.  Recent labs WNL  Monitor BP between visits while taking stimulant medication.   RTC 8 weeks  25 minutes spent dedicated to the care of this patient on the date of this encounter to include pre-visit review of records, ordering of medication, post visit documentation, and face-to-face time with the patient discussing PTSD, panic attacks, obsessional thoughts and acts, depression, ADD - combined type and bipolar disorder - 2. Discussed continuing current medication regimen.  Patient advised to contact office with any questions, adverse effects, or acute worsening in signs and symptoms.  Discussed potential benefits, risk, and side effects of benzodiazepines to include potential risk of tolerance and dependence, as well as possible drowsiness.  Advised patient not to drive if experiencing drowsiness and to take lowest possible effective dose to minimize risk of dependence and tolerance.   Discussed potential metabolic side effects associated with atypical antipsychotics, as well as potential risk for movement side effects. Advised pt to  contact office if movement side effects occur.    There are no diagnoses linked to this encounter.   Please see After Visit Summary for patient specific instructions.  Future Appointments  Date Time Provider Department Center  10/12/2023  1:00 PM Derrick Orris Nattalie, NP [facility] None    No orders of the defined types were placed in this encounter.     -------------------------------

## 2023-10-16 ENCOUNTER — Telehealth: Payer: Self-pay | Admitting: Family Medicine

## 2023-10-16 NOTE — Telephone Encounter (Signed)
 Tried to submit auth renewal via CMM, kept getting error that no auth was req. I called [employer] to check, spoke with [facility]. She states no PA is required and pt has to continue to fill through a pharmacy. No reference # was generated but her name Sonya Gibson. and today's date 10/16/2023 and time 11:13 am can be used for reference.

## 2023-10-16 NOTE — Telephone Encounter (Signed)
 Schedule Botox  request

## 2023-10-17 ENCOUNTER — Telehealth: Payer: Self-pay | Admitting: Adult Health

## 2023-10-17 NOTE — Telephone Encounter (Signed)
 I called [employer] again to make sure pt was also covered under medical benefit, rep was able to start auth over the phone with me. Pending case # is [id number] and clinical notes were faxed to [phone].

## 2023-10-17 NOTE — Telephone Encounter (Signed)
 [place] called at 4:38 to request the April prescription for her Adderall.  She has one for May but not April.  Has appt 5/2.  Send to  [facility]. - [place], [place] - [address]

## 2023-10-18 ENCOUNTER — Other Ambulatory Visit: Payer: Self-pay

## 2023-10-18 DIAGNOSIS — F902 Attention-deficit hyperactivity disorder, combined type: Secondary | ICD-10-CM

## 2023-10-18 MED ORDER — AMPHETAMINE-DEXTROAMPHET ER 30 MG PO CP24: 30.0000 mg | ORAL_CAPSULE | Freq: Every day | ORAL | 0 refills | Status: DC

## 2023-10-18 NOTE — Telephone Encounter (Signed)
 Pended Adderall 30 XR to [facility]

## 2023-10-18 NOTE — Telephone Encounter (Signed)
 Hazeline Lister, RN [employer] LVM at 4:33 pm have case number [id number] this is for on going Botox  and reading the notes. The notes are from 05/04/23 stating that her headaches are a little worse over the past 12 weeks. Do not have anything current from February and for on going Botox  we need to know that patient receiving some type of improvement or decrease in frequency or decease in intensity.  Are they able to participate more in ADLs; do not see that and its old clinicals. I think 05/04/23  the last note. If someone is fax those updated notes that would be great [phone], Case no [id number]. Also my direct confidential number is [phone]

## 2023-10-19 NOTE — Telephone Encounter (Signed)
 Sent text asking pt to check MyChart and respond to my message.

## 2023-10-19 NOTE — Telephone Encounter (Signed)
 I called Sonya Gibson back and discussed case. I explained to her that pt's last appointment was in November and is now scheduled for 5/14 due to some scheduling issues. We cancelled her appt in February and she had to figure out her work schedule before coming back in. Sonya repeatedly stated that the notes were old and did not state that the Botox  was working for her, and that if she hasn't been in since November her headaches "must not be that bad". Informed Sonya that I would reach out to the patient to see how the Botox  has worked for her and a number on how many less headaches she's had.  Sent MyChart message to pt, sending as FYI to Amy.

## 2023-10-20 ENCOUNTER — Telehealth (INDEPENDENT_AMBULATORY_CARE_PROVIDER_SITE_OTHER): Admitting: Adult Health

## 2023-10-20 ENCOUNTER — Encounter: Payer: Self-pay | Admitting: Adult Health

## 2023-10-20 DIAGNOSIS — F411 Generalized anxiety disorder: Secondary | ICD-10-CM | POA: Diagnosis not present

## 2023-10-20 DIAGNOSIS — F902 Attention-deficit hyperactivity disorder, combined type: Secondary | ICD-10-CM

## 2023-10-20 DIAGNOSIS — F4310 Post-traumatic stress disorder, unspecified: Secondary | ICD-10-CM

## 2023-10-20 DIAGNOSIS — F410 Panic disorder [episodic paroxysmal anxiety] without agoraphobia: Secondary | ICD-10-CM

## 2023-10-20 DIAGNOSIS — F3181 Bipolar II disorder: Secondary | ICD-10-CM

## 2023-10-20 NOTE — Progress Notes (Signed)
 Sonya Gibson [id number] 05/12/1986 38 y.o.  Virtual Visit via Video Note  I connected with pt @ on 10/20/23 at  2:00 PM EDT by a video enabled telemedicine application and verified that I am speaking with the correct person using two identifiers.   I discussed the limitations of evaluation and management by telemedicine and the availability of in person appointments. The patient expressed understanding and agreed to proceed.  I discussed the assessment and treatment plan with the patient. The patient was provided an opportunity to ask questions and all were answered. The patient agreed with the plan and demonstrated an understanding of the instructions.   The patient was advised to call back or seek an in-person evaluation if the symptoms worsen or if the condition fails to improve as anticipated.  I provided 30 minutes of non-face-to-face time during this encounter.  The patient was located at home.  The provider was located at [facility].   Reagan Camera, NP   Subjective:   Patient ID:  Sonya Gibson is a 38 y.o. (DOB [birth year]) female.  Chief Complaint: No chief complaint on file.   HPI Sonya Gibson presents for follow-up of PTSD, panic attacks, obsessional thoughts and acts, depression, ADD - combined type and bipolar disorder - 2.  Describes mood today as "not good". Pleasant. Reports tearfulness. Mood symptoms - reports depression, anxiety and irritability. Reports decreased interest and motivation.  Reports daily panic attacks. Reports flash backs. Reports outbursts. Reports worry, rumination and over thinking. Reports obsessive thoughts and acts. Reports mood has declined recently. Stating "I don't feel like I'm doing too good". Reports daughter sick with Covid last week and she had to care for her. This week she has not felt well mentally or physically. Feels like the addition of Abilify  has been helpful, but maybe not enough - willing to increase dose. Reports  her husband is off this weekend and she should be able to get some rest and recover. Taking medications as prescribed.  Appetite adequate. Weight loss with - 252 pounds - [place] - A1C improved. Reports sleeping well most nights. Averages 4 to 6  hours. Denies excessive daytime napping. Reports focus and concentration difficulties. Taking Adderall 5 out of 7 days. Diagnosed with ADD in childhood. Completing tasks. Managing aspects of household. Reports being approved for LTD through her employer. Plans to continue to pursue SSD benefits. Denies SI or HI.  Denies AH. Denies VH - having visual disturbances. Denies self harm. Denies substance use.   Previous medication trials:  Buspar, Hydroxyzine.  Review of Systems:  Review of Systems  Musculoskeletal:  Negative for gait problem.  Neurological:  Negative for tremors.  Psychiatric/Behavioral:         Please refer to HPI   Medications: I have reviewed the patient's current medications.  Current Outpatient Medications  Medication Sig Dispense Refill   acetaminophen  (TYLENOL ) 325 MG tablet Take 650 mg by mouth every 6 (six) hours as needed.     albuterol  (VENTOLIN  HFA) 108 (90 Base) MCG/ACT inhaler Inhale 2 puffs into the lungs every 6 (six) hours as needed for wheezing or shortness of breath.     amphetamine -dextroamphetamine  (ADDERALL XR) 30 MG 24 hr capsule Take 1 capsule (30 mg total) by mouth daily. 30 capsule 0   [START ON 11/07/2023] amphetamine -dextroamphetamine  (ADDERALL XR) 30 MG 24 hr capsule Take 1 capsule (30 mg total) by mouth daily. 30 capsule 0   amphetamine -dextroamphetamine  (ADDERALL XR) 30 MG 24 hr capsule Take  1 capsule (30 mg total) by mouth daily. 30 capsule 0   ARIPiprazole  (ABILIFY ) 2 MG tablet Take 1 tablet (2 mg total) by mouth 2 (two) times daily. 60 tablet 2   botulinum toxin Type A  (BOTOX ) 200 units injection Provider to inject 155 units into the muscles of the head and neck every 12 weeks. Discard remainder. 1  each 3   calcium  carbonate (TUMS EX) 750 MG chewable tablet Chew 2 tablets by mouth as needed for heartburn.     celecoxib  (CELEBREX ) 200 MG capsule Take 200 mg by mouth daily.     CHERRY PO Take 1 tablet by mouth daily.     DULoxetine  (CYMBALTA ) 60 MG capsule Take 2 capsules (120 mg total) by mouth daily. 60 capsule 5   fluconazole  (DIFLUCAN ) 150 MG tablet Take 150 mg by mouth once.     gabapentin  (NEURONTIN ) 600 MG tablet Take 1,200 mg by mouth 3 (three) times daily.     LORazepam  (ATIVAN ) 0.5 MG tablet TAKE 1 TABLET BY MOUTH TWICE DAILY AS NEEDED FOR ANXIETY. 60 tablet 0   MOUNJARO 10 MG/0.5ML Pen SMARTSIG:10 Milligram(s) SUB-Q Once a Week     naloxone  (NARCAN ) nasal spray 4 mg/0.1 mL Place into the nose.     ondansetron  (ZOFRAN -ODT) 4 MG disintegrating tablet DISSOLVE ONE TABLET UNDER THE TONGUE EVERY 8 HOURS AS NEEDED FOR NAUSEA 30 tablet 0   oxyCODONE  (OXYCONTIN ) 10 mg 12 hr tablet Take 10 mg by mouth every 12 (twelve) hours.     promethazine  (PHENERGAN ) 25 MG tablet Take 1 tablet (25 mg total) by mouth every 6 (six) hours as needed for nausea or vomiting. 120 tablet 0   QUEtiapine  (SEROQUEL ) 100 MG tablet Take 1 tablet (100 mg total) by mouth at bedtime. 30 tablet 5   Rimegepant Sulfate (NURTEC) 75 MG TBDP Take 75 mg by mouth daily as needed. For migraines. Take as close to onset of migraine as possible. One daily maximum. 10 tablet 6   rizatriptan  (MAXALT -MLT) 10 MG disintegrating tablet Take 1 tablet (10 mg total) by mouth as needed for migraine. May repeat in 2 hours if needed 9 tablet 11   tiZANidine  (ZANAFLEX ) 4 MG tablet Take 1.5 tablets (6 mg total) by mouth every 8 (eight) hours.     traMADol (ULTRAM-ER) 300 MG 24 hr tablet Take 300 mg by mouth daily.     traZODone  (DESYREL ) 150 MG tablet Take 1 tablet (150 mg total) by mouth at bedtime. 30 tablet 5   No current facility-administered medications for this visit.    Medication Side Effects: None  Allergies:  Allergies   Allergen Reactions   Cinnamon Anaphylaxis    (NO "REAL"/TREE BARK CINNAMON)   Clindamycin Nausea And Vomiting    Other reaction(s): gi distress Chest pain, trouble swallowing Chest pain, trouble swallowing    Coconut (Cocos Nucifera) Anaphylaxis    Can eat "fake" coconut   Lovenox  [Enoxaparin ] Other (See Comments)    BROKE DOWN THE SKIN AT INJECTION SITE AND CAUSED A WOUND   Nystatin Anaphylaxis   Aspartame     Other reaction(s): Other (See Comments) "Breathing Difficulty, Serious intense migraines".  "Breathing Difficulty, Serious intense migraines".     Niacin Hives   Nitrofurantoin Hives   Other     "Headache cocktail"   Blister to abd   Pregabalin     Other reaction(s): Other (See Comments) Depression with suicidal thoughts Depression with suicidal thoughts     Past Medical History:  Diagnosis Date  Anxiety    Chronic back pain    Depression    Diabetes (HCC)    Drug-seeking behavior    Essential hypertension    GERD (gastroesophageal reflux disease)    Hypothyroidism    Kidney stone    Lumbar radiculopathy, right    Migraine headache    PTSD (post-traumatic stress disorder)     Family History  Problem Relation Age of Onset   Diabetes Mellitus II Mother    Hypertension Mother    Seizures Mother    Other Mother        post stroke headaches   Stroke Mother    Hypertension Father    Diabetes Mellitus II Father    Hyperlipidemia Father    Bipolar disorder Sister    Asthma Son    Autism Son    Tourette syndrome Son    Autism Daughter        level 3   Other Daughter        tibia torsion & femoral aniversion    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    Highest education level: Not on file  Occupational History   Not on file  Tobacco Use   Smoking status: Former    Current packs/day: 0.00    Average packs/day: 2.0 packs/day for  3.0 years (6.0 ttl pk-yrs)    Types: Cigarettes    Start date: 2003    Quit date: 2006    Years since quitting: 19.3   Smokeless tobacco: Never  Vaping Use   Vaping status: Never Used  Substance and Sexual Activity   Alcohol use: Yes    Comment: socially   Drug use: No   Sexual activity: Yes    Birth control/protection: None  Other Topics Concern   Not on file  Social History Narrative   Lives at home with husband and kids   Right handed   Caffeine : 6 cups daily   Social Drivers of Health   Financial Resource Strain: Low Risk  (01/03/2022)   Received from [facility], [facility]   Overall Financial Resource Strain (CARDIA)    Difficulty of Paying Living Expenses: Not very hard  Food Insecurity: No Food Insecurity (01/03/2022)   Received from [facility], [facility]   Hunger Vital Sign    Worried About Running Out of Food in the Last Year: Never true    Ran Out of Food in the Last Year: Never true  Transportation Needs: No Transportation Needs (01/03/2022)   Received from [facility], [facility]   PRAPARE - Transportation    Lack of Transportation (Medical): No    Lack of Transportation (Non-Medical): No  Physical Activity: Not on file  Stress: Not on file  Social Connections: Not on file  Intimate Partner Violence: Not on file    Past Medical History, Surgical history, Social history, and Family history were reviewed and updated as appropriate.   Please see review of systems for further details on the patient's review from today.   Objective:   Physical Exam:  There were no vitals taken for this visit.  Physical Exam Constitutional:      General: She is not in acute distress. Musculoskeletal:        General: No deformity.  Neurological:     Mental Status: She  is alert and oriented to person, place, and time.     Coordination: Coordination normal.  Psychiatric:        Attention and Perception: Attention and perception normal. She  does not perceive auditory or visual hallucinations.        Mood and Affect: Mood normal. Mood is not anxious or depressed. Affect is not labile, blunt, angry or inappropriate.        Speech: Speech normal.        Behavior: Behavior normal.        Thought Content: Thought content normal. Thought content is not paranoid or delusional. Thought content does not include homicidal or suicidal ideation. Thought content does not include homicidal or suicidal plan.        Cognition and Memory: Cognition and memory normal.        Judgment: Judgment normal.     Comments: Insight intact     Lab Review:     Component Value Date/Time   NA 135 08/21/2023 1948   K 3.9 08/21/2023 1948   CL 102 08/21/2023 1948   CO2 23 08/21/2023 1948   GLUCOSE 118 (H) 08/21/2023 1948   BUN 14 08/21/2023 1948   CREATININE 0.85 08/21/2023 1948   CALCIUM  8.9 08/21/2023 1948   PROT 7.4 08/21/2023 1948   ALBUMIN 4.1 08/21/2023 1948   AST 21 08/21/2023 1948   ALT 21 08/21/2023 1948   ALKPHOS 76 08/21/2023 1948   BILITOT 0.8 08/21/2023 1948   GFRNONAA >60 08/21/2023 1948   GFRAA >60 02/21/2020 1033       Component Value Date/Time   WBC 6.2 08/21/2023 1948   RBC 4.26 08/21/2023 1948   HGB 13.0 08/21/2023 1948   HCT 38.7 08/21/2023 1948   PLT 400 08/21/2023 1948   MCV 90.8 08/21/2023 1948   MCH 30.5 08/21/2023 1948   MCHC 33.6 08/21/2023 1948   RDW 14.9 08/21/2023 1948   LYMPHSABS 4.1 (H) 10/17/2022 1747   MONOABS 0.4 10/17/2022 1747   EOSABS 0.2 10/17/2022 1747   BASOSABS 0.1 10/17/2022 1747    No results found for: "POCLITH", "LITHIUM"   No results found for: "PHENYTOIN", "PHENOBARB", "VALPROATE", "CBMZ"   .res Assessment: Plan:    Plan:  PDMP reviewed  Increase Abilify  3mg  to 5mg  daily for mood symptoms - helpful at lower doses  May increase Seroquel  100mg  to 200mg  at hs over the weekend for sleep  Patient to call on Monday 05/05 with an update.   Trazadone 150mg  at bedtime - typically 75mg   at hs Adderall XR 30mg  every morning Cymbalta  60mg  - 2 daily Lorazepam  0.5mg  daily prn panic attacks - does not take every day.  Recent labs WNL  Monitor BP between visits while taking stimulant medication.   RTC 8 weeks  30 minutes spent dedicated to the care of this patient on the date of this encounter to include pre-visit review of records, ordering of medication, post visit documentation, and face-to-face time with the patient discussing PTSD, panic attacks, obsessional thoughts and acts, depression, ADD - combined type and bipolar disorder - 2. Discussed continuing current medication regimen.  Patient advised to contact office with any questions, adverse effects, or acute worsening in signs and symptoms.  Discussed potential benefits, risk, and side effects of benzodiazepines to include potential risk of tolerance and dependence, as well as possible drowsiness.  Advised patient not to drive if experiencing drowsiness and to take lowest possible effective dose to minimize risk of dependence and tolerance.   Discussed  potential metabolic side effects associated with atypical antipsychotics, as well as potential risk for movement side effects. Advised pt to contact office if movement side effects occur.    Diagnoses and all orders for this visit:  Bipolar II disorder (HCC)  Generalized anxiety disorder  PTSD (post-traumatic stress disorder)  Attention deficit hyperactivity disorder (ADHD), combined type  Panic attacks     Please see After Visit Summary for patient specific instructions.  Future Appointments  Date Time Provider Department Center  11/01/2023 10:30 AM Lomax, Amy, NP [facility] None    No orders of the defined types were placed in this encounter.     -------------------------------

## 2023-10-23 NOTE — Telephone Encounter (Signed)
 LVM asking pt to check MyChart msg and reply with # of headaches.

## 2023-10-24 ENCOUNTER — Telehealth: Payer: Self-pay

## 2023-10-24 NOTE — Telephone Encounter (Signed)
 Prior authorization submitted with [employer] for Aripiprazole  2 mg #60 for 30 day

## 2023-10-24 NOTE — Telephone Encounter (Signed)
 LVM x2 advising pt that we may need to cancel appt if I don't hear back from her.

## 2023-10-24 NOTE — Telephone Encounter (Signed)
 Prior approval received effective 10/24/23-11/12/24 with [facility], AV#[id number] for Aripiprazole  2 mg tablets

## 2023-10-27 ENCOUNTER — Other Ambulatory Visit: Payer: Self-pay

## 2023-10-27 ENCOUNTER — Encounter (HOSPITAL_COMMUNITY): Payer: Self-pay | Admitting: *Deleted

## 2023-10-27 ENCOUNTER — Emergency Department (HOSPITAL_COMMUNITY)
Admission: EM | Admit: 2023-10-27 | Discharge: 2023-10-27 | Disposition: A | Discharge status: Home / Self Care | Attending: Emergency Medicine | Admitting: Emergency Medicine

## 2023-10-27 DIAGNOSIS — Z79899 Other long term (current) drug therapy: Secondary | ICD-10-CM | POA: Insufficient documentation

## 2023-10-27 DIAGNOSIS — R1084 Generalized abdominal pain: Secondary | ICD-10-CM | POA: Insufficient documentation

## 2023-10-27 DIAGNOSIS — R1115 Cyclical vomiting syndrome unrelated to migraine: Secondary | ICD-10-CM | POA: Diagnosis not present

## 2023-10-27 DIAGNOSIS — I10 Essential (primary) hypertension: Secondary | ICD-10-CM | POA: Diagnosis not present

## 2023-10-27 DIAGNOSIS — R112 Nausea with vomiting, unspecified: Secondary | ICD-10-CM | POA: Diagnosis present

## 2023-10-27 DIAGNOSIS — E119 Type 2 diabetes mellitus without complications: Secondary | ICD-10-CM | POA: Diagnosis not present

## 2023-10-27 DIAGNOSIS — E876 Hypokalemia: Secondary | ICD-10-CM | POA: Diagnosis not present

## 2023-10-27 DIAGNOSIS — Z7984 Long term (current) use of oral hypoglycemic drugs: Secondary | ICD-10-CM | POA: Diagnosis not present

## 2023-10-27 LAB — COMPREHENSIVE METABOLIC PANEL WITH GFR
ALT: 21 U/L (ref 0–44)
AST: 44 U/L — ABNORMAL HIGH (ref 15–41)
Albumin: 4.9 g/dL (ref 3.5–5.0)
Alkaline Phosphatase: 72 U/L (ref 38–126)
Anion gap: 17 — ABNORMAL HIGH (ref 5–15)
BUN: 31 mg/dL — ABNORMAL HIGH (ref 6–20)
CO2: 23 mmol/L (ref 22–32)
Calcium: 9.3 mg/dL (ref 8.9–10.3)
Chloride: 95 mmol/L — ABNORMAL LOW (ref 98–111)
Creatinine, Ser: 1.11 mg/dL — ABNORMAL HIGH (ref 0.44–1.00)
GFR, Estimated: >60 mL/min (ref >60)
Glucose, Bld: 204 mg/dL — ABNORMAL HIGH (ref 70–99)
Potassium: 2.9 mmol/L — ABNORMAL LOW (ref 3.5–5.1)
Sodium: 135 mmol/L (ref 135–145)
Total Bilirubin: 1 mg/dL (ref 0.0–1.2)
Total Protein: 8.4 g/dL — ABNORMAL HIGH (ref 6.5–8.1)

## 2023-10-27 LAB — URINALYSIS, ROUTINE W REFLEX MICROSCOPIC
Bilirubin Urine: NEGATIVE
Glucose, UA: NEGATIVE mg/dL
Ketones, ur: 5 mg/dL — AB
Leukocytes,Ua: NEGATIVE
Nitrite: NEGATIVE
Protein, ur: 100 mg/dL — AB
Specific Gravity, Urine: 1.028 (ref 1.005–1.030)
pH: 5 (ref 5.0–8.0)

## 2023-10-27 LAB — LIPASE, BLOOD: Lipase: 21 U/L (ref 11–51)

## 2023-10-27 LAB — CBC
HCT: 40.5 % (ref 36.0–46.0)
Hemoglobin: 14.1 g/dL (ref 12.0–15.0)
MCH: 30.9 pg (ref 26.0–34.0)
MCHC: 34.8 g/dL (ref 30.0–36.0)
MCV: 88.6 fL (ref 80.0–100.0)
Platelets: 550 10*3/uL — ABNORMAL HIGH (ref 150–400)
RBC: 4.57 MIL/uL (ref 3.87–5.11)
RDW: 14.9 % (ref 11.5–15.5)
WBC: 6.9 10*3/uL (ref 4.0–10.5)
nRBC: 0 % (ref 0.0–0.2)

## 2023-10-27 LAB — POC URINE PREG, ED: Preg Test, Ur: NEGATIVE

## 2023-10-27 LAB — PREGNANCY, URINE: Preg Test, Ur: NEGATIVE

## 2023-10-27 MED ORDER — PROMETHAZINE HCL 25 MG/ML IJ SOLN
25.0000 mg | Freq: Four times a day (QID) | INTRAMUSCULAR | Status: DC | PRN
  Administered 2023-10-27: 25 mg via INTRAMUSCULAR
  Filled 2023-10-27: qty 1

## 2023-10-27 MED ORDER — MORPHINE SULFATE 15 MG PO TABS: 7.5000 mg | ORAL_TABLET | Freq: Four times a day (QID) | ORAL | 0 refills | Status: DC | PRN

## 2023-10-27 MED ORDER — MORPHINE SULFATE (PF) 4 MG/ML IV SOLN
4.0000 mg | Freq: Once | INTRAVENOUS | Status: AC
  Administered 2023-10-27: 4 mg via INTRAVENOUS
  Filled 2023-10-27: qty 1

## 2023-10-27 MED ORDER — POTASSIUM CHLORIDE IN NACL 20-0.9 MEQ/L-% IV SOLN
Freq: Once | INTRAVENOUS | Status: AC
  Filled 2023-10-27: qty 1000

## 2023-10-27 MED ORDER — DROPERIDOL 2.5 MG/ML IJ SOLN
1.2500 mg | Freq: Once | INTRAMUSCULAR | Status: AC
  Administered 2023-10-27: 1.25 mg via INTRAVENOUS
  Filled 2023-10-27: qty 2

## 2023-10-27 MED ORDER — KETOROLAC TROMETHAMINE 30 MG/ML IJ SOLN
15.0000 mg | Freq: Once | INTRAMUSCULAR | Status: AC
  Administered 2023-10-27: 15 mg via INTRAVENOUS
  Filled 2023-10-27: qty 1

## 2023-10-27 MED ORDER — PROMETHAZINE HCL 25 MG RE SUPP: 25.0000 mg | Freq: Four times a day (QID) | RECTAL | 0 refills | Status: AC | PRN

## 2023-10-27 NOTE — Discharge Instructions (Signed)
 As discussed, following up with your physician and consideration of going to a cyclic vomiting clinic may be beneficial.  Please take all medications prescribed and follow-up with your physician and your gastroenterologist.  Return here for concerning changes in your condition.

## 2023-10-27 NOTE — ED Provider Notes (Signed)
 [place] EMERGENCY DEPARTMENT AT [facility] Provider Note   CSN: [id number] Arrival date & time: 10/27/23  1707     History  Chief Complaint  Patient presents with   Abdominal Pain    Sonya Gibson is a 38 y.o. female.  HPI Patient presents with her mother who assists with the history.  Additional details from chart review.  Patient has multiple medical problems including chronic pain, recurrent episodes similar to what she is currently experiencing, though she does not have a formal diagnosis of cyclic vomiting syndrome. For the past few days she has had severe abdominal pain, nausea, vomiting, p.o. intolerance.  She was seen at a different emergency department last night, had labs, CT scan, was discharged.  She notes that today her symptoms have persisted with inability to take anything orally.  Pain is diffuse, possibly more right than left, unchanged over the past few days.    Home Medications Prior to Admission medications   Medication Sig Start Date End Date Taking? Authorizing Provider  acetaminophen  (TYLENOL ) 325 MG tablet Take 650 mg by mouth every 6 (six) hours as needed.    [provider]  albuterol  (VENTOLIN  HFA) 108 (90 Base) MCG/ACT inhaler Inhale 2 puffs into the lungs every 6 (six) hours as needed for wheezing or shortness of breath.    [provider]  amphetamine -dextroamphetamine  (ADDERALL XR) 30 MG 24 hr capsule Take 1 capsule (30 mg total) by mouth daily. 09/12/23   Mozingo, Regina Nattalie, NP  amphetamine -dextroamphetamine  (ADDERALL XR) 30 MG 24 hr capsule Take 1 capsule (30 mg total) by mouth daily. 11/07/23   Mozingo, Regina Nattalie, NP  amphetamine -dextroamphetamine  (ADDERALL XR) 30 MG 24 hr capsule Take 1 capsule (30 mg total) by mouth daily. 10/18/23   Mozingo, Regina Nattalie, NP  ARIPiprazole  (ABILIFY ) 2 MG tablet Take 1 tablet (2 mg total) by mouth 2 (two) times daily. 09/12/23   Mozingo, Regina Nattalie, NP  botulinum toxin  Type A (BOTOX ) 200 units injection Provider to inject 155 units into the muscles of the head and neck every 12 weeks. Discard remainder. 10/03/22   Glory Larsen, MD  calcium  carbonate (TUMS EX) 750 MG chewable tablet Chew 2 tablets by mouth as needed for heartburn.    [provider]  celecoxib  (CELEBREX ) 200 MG capsule Take 200 mg by mouth daily.    [provider]  CHERRY PO Take 1 tablet by mouth daily.    [provider]  DULoxetine  (CYMBALTA ) 60 MG capsule Take 2 capsules (120 mg total) by mouth daily. 09/12/23   Mozingo, Regina Nattalie, NP  fluconazole  (DIFLUCAN ) 150 MG tablet Take 150 mg by mouth once. 08/10/23   [provider]  gabapentin  (NEURONTIN ) 600 MG tablet Take 1,200 mg by mouth 3 (three) times daily.    [provider]  LORazepam  (ATIVAN ) 0.5 MG tablet TAKE 1 TABLET BY MOUTH TWICE DAILY AS NEEDED FOR ANXIETY. 10/11/23   Mozingo, Regina Nattalie, NP  MOUNJARO 10 MG/0.5ML Pen SMARTSIG:10 Milligram(s) SUB-Q Once a Week    [provider]  naloxone  (NARCAN ) nasal spray 4 mg/0.1 mL Place into the nose. 09/12/21   [provider]  ondansetron  (ZOFRAN -ODT) 4 MG disintegrating tablet DISSOLVE ONE TABLET UNDER THE TONGUE EVERY 8 HOURS AS NEEDED FOR NAUSEA 12/28/22   Glory Larsen, MD  oxyCODONE  (OXYCONTIN ) 10 mg 12 hr tablet Take 10 mg by mouth every 12 (twelve) hours.    [provider]  promethazine  (PHENERGAN ) 25  MG tablet Take 1 tablet (25 mg total) by mouth every 6 (six) hours as needed for nausea or vomiting. 01/10/23 08/22/23  Sonnie Dusky, PA-C  QUEtiapine  (SEROQUEL ) 100 MG tablet Take 1 tablet (100 mg total) by mouth at bedtime. 08/08/23   Mozingo, Regina Nattalie, NP  Rimegepant Sulfate (NURTEC) 75 MG TBDP Take 75 mg by mouth daily as needed. For migraines. Take as close to onset of migraine as possible. One daily maximum. 05/09/22   Glory Larsen, MD  rizatriptan  (MAXALT -MLT) 10 MG disintegrating tablet Take  1 tablet (10 mg total) by mouth as needed for migraine. May repeat in 2 hours if needed 05/12/21   Glory Larsen, MD  tiZANidine  (ZANAFLEX ) 4 MG tablet Take 1.5 tablets (6 mg total) by mouth every 8 (eight) hours. 04/13/20   [provider]  traMADol (ULTRAM-ER) 300 MG 24 hr tablet Take 300 mg by mouth daily.    [provider]  traZODone  (DESYREL ) 150 MG tablet Take 1 tablet (150 mg total) by mouth at bedtime. 09/12/23   Mozingo, Regina Nattalie, NP  levETIRAcetam  (KEPPRA ) 500 MG tablet Take 1 tablet (500 mg total) by mouth 2 (two) times daily. 07/10/18 07/12/19  Demaris Fillers, MD  metoprolol  tartrate (LOPRESSOR ) 25 MG tablet Take 0.5 tablets (12.5 mg total) by mouth 2 (two) times daily. 07/10/18 06/02/19  Demaris Fillers, MD  venlafaxine  XR (EFFEXOR -XR) 75 MG 24 hr capsule Take 75 mg by mouth at bedtime.  12/12/17 06/02/19  [provider]      Allergies    Cinnamon, Clindamycin, Coconut (cocos nucifera), Lovenox  [enoxaparin ], Nystatin, Aspartame, Niacin, Nitrofurantoin, Other, and Pregabalin    Review of Systems   Review of Systems  Physical Exam Updated Vital Signs BP 137/78   Pulse 79   Temp 97.7 F (36.5 C) (Oral)   Resp 18   Ht 5\' 1"  (1.549 m)   Wt 122.5 kg   LMP 10/23/2023   SpO2 96%   BMI 51.02 kg/m  Physical Exam Vitals and nursing note reviewed.  Constitutional:      Appearance: She is well-developed. She is obese. She is ill-appearing and diaphoretic.  HENT:     Head: Normocephalic and atraumatic.  Eyes:     Conjunctiva/sclera: Conjunctivae normal.  Cardiovascular:     Rate and Rhythm: Regular rhythm. Tachycardia present.  Pulmonary:     Effort: Pulmonary effort is normal. No respiratory distress.  Abdominal:     General: There is no distension.     Tenderness: There is no guarding.  Skin:    General: Skin is warm.  Neurological:     Mental Status: She is alert and oriented to person, place, and time.     Cranial Nerves: No cranial nerve  deficit.  Psychiatric:        Mood and Affect: Mood normal.     ED Results / Procedures / Treatments   Labs (all labs ordered are listed, but only abnormal results are displayed) Labs Reviewed  COMPREHENSIVE METABOLIC PANEL WITH GFR - Abnormal; Notable for the following components:      Result Value   Potassium 2.9 (*)    Chloride 95 (*)    Glucose, Bld 204 (*)    BUN 31 (*)    Creatinine, Ser 1.11 (*)    Total Protein 8.4 (*)    AST 44 (*)    Anion gap 17 (*)    All other components within normal limits  CBC - Abnormal; Notable for the following  components:   Platelets 550 (*)    All other components within normal limits  URINALYSIS, ROUTINE W REFLEX MICROSCOPIC - Abnormal; Notable for the following components:   Color, Urine AMBER (*)    APPearance TURBID (*)    Hgb urine dipstick SMALL (*)    Ketones, ur 5 (*)    Protein, ur 100 (*)    Bacteria, UA RARE (*)    All other components within normal limits  LIPASE, BLOOD  PREGNANCY, URINE  POC URINE PREG, ED    EKG EKG Interpretation Date/Time:  Friday Oct 27 2023 21:12:30 EDT Ventricular Rate:  97 PR Interval:  132 QRS Duration:  76 QT Interval:  364 QTC Calculation: 463 R Axis:   43  Text Interpretation: Sinus rhythm Borderline T abnormalities, anterior leads Confirmed by Dorenda Gandy [phone]) on 10/27/2023 9:21:41 PM  Radiology No results found.  Procedures Procedures    Medications Ordered in ED Medications  promethazine  (PHENERGAN ) injection 25 mg (25 mg Intramuscular Given 10/27/23 2153)  droperidol (INAPSINE) 2.5 MG/ML injection 1.25 mg (1.25 mg Intravenous Given 10/27/23 2011)  0.9 % NaCl with KCl 20 mEq/ L  infusion ( Intravenous New Bag/Given 10/27/23 2016)  ketorolac  (TORADOL ) 30 MG/ML injection 15 mg (15 mg Intravenous Given 10/27/23 2019)  morphine  (PF) 4 MG/ML injection 4 mg (4 mg Intravenous Given 10/27/23 2046)  morphine  (PF) 4 MG/ML injection 4 mg (4 mg Intravenous Given 10/27/23 2153)    ED  Course/ Medical Decision Making/ A&P                                 Medical Decision Making Obese young female presents with nausea, vomiting, abdominal discomfort in a cyclic pattern concerning for cyclic vomiting syndrome though with her history of diabetes, hypertension, infection, DKA, nonketotic hyperosmolar state, electrolyte abnormalities all considered. Patient received continuous monitoring, droperidol, fluids labs from triage.  CT performed yesterday results reviewed included below. Cardiac 105 sinus tach abnormal Pulse ox 99% room air normal  Amount and/or Complexity of Data Reviewed Independent Historian:     Details: Mother at bedside External Data Reviewed: notes.    Details: Notes from ED at outside hospital reviewed CT included below Labs: ordered. Decision-making details documented in ED Course. Radiology:  Decision-making details documented in ED Course. ECG/medicine tests: ordered and independent interpretation performed. Decision-making details documented in ED Course.  Risk Prescription drug management. Decision regarding hospitalization. Diagnosis or treatment significantly limited by social determinants of health.  HPI  Caitlynd Labrosse is a 38 y.o. female who presents today to the emergency department complaining of upper abd pain and n/v that started last night. Pt states this has happened before w/out clear answers. Denies fever. Some mild diarrhea. Has PMH of chronic pain, migraines, depression/anxiety, DM, PCOS, C-SECTION. Pt actively vomiting on my arrival and tearful.   Allergies: is allergic to cinnamon, coconut, enoxaparin , macrobid [nitrofurantoin monohyd/m-cryst], nitrofurantoin, nystatin, aspartame, and lyrica [pregabalin]. Medications: has a current medication list which includes the following long-term medication(s): albuterol , duloxetine , gabapentin , olanzapine, trazodone , and verapamil. PMHx: has a past medical history of Anxiety, Chronic  pain, Depression, Diabetes mellitus, Fatty liver, GERD (gastroesophageal reflux disease), Headache, Hyperlipidemia, Hypothyroid, Obesity, PCOS (polycystic ovarian syndrome), and Vitamin D deficiency. PSHx: has a past surgical history that includes Cesarean section; Anterior cruciate ligament repair; Tonsillectomy; Adenoidectomy; Finger surgery; Lumbar laminectomy; pr upper gi endoscopy,biopsy (N/A, 11/25/2020); Lumbar discectomy (N/A); Meniscectomy (Left); and repair spinal  fluid leak (N/A). SocHx: reports that she has never smoked. She has never used smokeless tobacco. She reports that she does not currently use alcohol. She reports that she does not currently use drugs. Allergies, Medications, Medical, Surgical, and Social History were reviewed as documented above.  CT Abdomen Pelvis Wo Contrast Result Date: 10/26/2023 Exam: CT of the abdomen and pelvis without contrast History: Upper abdominal pain Technique: Routine CT of the abdomen and pelvis without IV contrast. AEC (automated exposure control) and/or manual techniques such as size-specific kV and mAs are employed where appropriate to reduce radiation exposure for all CT exams. Comparison: CT 02/26/2020 Abdomen CT Findings: LOWER THORAX: Unremarkable. HEPATOBILIARY: Normal. PANCREAS: Normal. SPLEEN: Normal. ADRENALS: Normal. KIDNEYS/URETERS: Normal. VASCULAR: Nondilated aorta. BOWEL/MESENTERY: Normal appendix. The stomach and bowel are unremarkable. No inflammatory changes in the mesentery. PELVIS: Uterus and bladder are unremarkable. No adnexal lesions. No pelvic free fluid. BONES/SOFT TISSUES: Moderate disc degeneration at L4-5 and L5-S1. Minor degenerative facet changes in the lower lumbar spine. Spinal stimulator position appears satisfactory.   1. No acute process in the abdomen or pelvis. 2. Chronic changes as above. Signed (Electronic Signature): 10/26/2023 10:03 PM Signed By: Babetta Lesch, MD 10:16 PM Patient coming by her husband.  Symptoms  have improved, she remains mildly tachycardic, 100 heart rate, no additional vomiting.  We had a lengthy conversation about the patient's history, possibilities including cyclic vomiting syndrome.  Yesterday's reassuring CT scan, today's labs that are notable for mild dehydration, mild hypokalemia otherwise reassuring.  Patient is receiving fluids with potassium repletion.   Adult female, as above with multiple medical problems including signs and symptoms of recurrent nausea vomiting/cyclic vomiting syndrome.  Patient is awake, alert, afebrile, with nontender abdomen, reassuring CT from yesterday, labs as above generally reassuring improved here with fluids, antiemetics, will follow-up with GI as scheduled next month, and was advised on other follow-up options in the region.        Final Clinical Impression(s) / ED Diagnoses Final diagnoses:  Generalized abdominal pain  Cyclical vomiting syndrome not associated with migraine  Hypokalemia    Rx / DC Orders ED Discharge Orders     None         Dorenda Gandy, MD 10/27/23 2223

## 2023-10-27 NOTE — ED Notes (Signed)
 Pt/family received d/c paperwork at this time. After going over the paperwork any questions, comments, or concerns were answered to the best of this nurse's knowledge. The pt/family verbally acknowledged the teachings/instructions.

## 2023-10-27 NOTE — ED Triage Notes (Addendum)
 Pt with abd pain with N/V/D since Wednesday night, has tried nausea medication without relief.  Pt with hx of same but is worse this time. Pt was seen at [facility] last night and had CT done.  Pt is requesting Phenergan  shot in triage.

## 2023-10-28 ENCOUNTER — Encounter (HOSPITAL_COMMUNITY): Payer: Self-pay | Admitting: *Deleted

## 2023-10-28 ENCOUNTER — Emergency Department (HOSPITAL_COMMUNITY)
Admission: EM | Admit: 2023-10-28 | Discharge: 2023-10-28 | Disposition: A | Discharge status: Home / Self Care | Attending: Emergency Medicine | Admitting: Emergency Medicine

## 2023-10-28 DIAGNOSIS — R112 Nausea with vomiting, unspecified: Secondary | ICD-10-CM | POA: Diagnosis not present

## 2023-10-28 DIAGNOSIS — I10 Essential (primary) hypertension: Secondary | ICD-10-CM | POA: Diagnosis not present

## 2023-10-28 DIAGNOSIS — E039 Hypothyroidism, unspecified: Secondary | ICD-10-CM | POA: Insufficient documentation

## 2023-10-28 DIAGNOSIS — E119 Type 2 diabetes mellitus without complications: Secondary | ICD-10-CM | POA: Diagnosis not present

## 2023-10-28 DIAGNOSIS — E876 Hypokalemia: Secondary | ICD-10-CM | POA: Insufficient documentation

## 2023-10-28 DIAGNOSIS — R1084 Generalized abdominal pain: Secondary | ICD-10-CM | POA: Diagnosis present

## 2023-10-28 LAB — COMPREHENSIVE METABOLIC PANEL WITH GFR
ALT: 22 U/L (ref 0–44)
AST: 52 U/L — ABNORMAL HIGH (ref 15–41)
Albumin: 4.6 g/dL (ref 3.5–5.0)
Alkaline Phosphatase: 65 U/L (ref 38–126)
Anion gap: 12 (ref 5–15)
BUN: 24 mg/dL — ABNORMAL HIGH (ref 6–20)
CO2: 27 mmol/L (ref 22–32)
Calcium: 9.4 mg/dL (ref 8.9–10.3)
Chloride: 96 mmol/L — ABNORMAL LOW (ref 98–111)
Creatinine, Ser: 0.95 mg/dL (ref 0.44–1.00)
GFR, Estimated: >60 mL/min (ref >60)
Glucose, Bld: 163 mg/dL — ABNORMAL HIGH (ref 70–99)
Potassium: 3.2 mmol/L — ABNORMAL LOW (ref 3.5–5.1)
Sodium: 135 mmol/L (ref 135–145)
Total Bilirubin: 0.7 mg/dL (ref 0.0–1.2)
Total Protein: 8.3 g/dL — ABNORMAL HIGH (ref 6.5–8.1)

## 2023-10-28 LAB — CBC
HCT: 40.1 % (ref 36.0–46.0)
Hemoglobin: 13.8 g/dL (ref 12.0–15.0)
MCH: 30.9 pg (ref 26.0–34.0)
MCHC: 34.4 g/dL (ref 30.0–36.0)
MCV: 89.7 fL (ref 80.0–100.0)
Platelets: 480 10*3/uL — ABNORMAL HIGH (ref 150–400)
RBC: 4.47 MIL/uL (ref 3.87–5.11)
RDW: 14.9 % (ref 11.5–15.5)
WBC: 5.8 10*3/uL (ref 4.0–10.5)
nRBC: 0 % (ref 0.0–0.2)

## 2023-10-28 LAB — LIPASE, BLOOD: Lipase: 24 U/L (ref 11–51)

## 2023-10-28 MED ORDER — SODIUM CHLORIDE 0.9 % IV BOLUS
1000.0000 mL | Freq: Once | INTRAVENOUS | Status: AC
  Administered 2023-10-28: 1000 mL via INTRAVENOUS

## 2023-10-28 MED ORDER — MORPHINE SULFATE 15 MG PO TABS: 7.5000 mg | ORAL_TABLET | Freq: Four times a day (QID) | ORAL | 0 refills | Status: AC | PRN

## 2023-10-28 MED ORDER — DIPHENHYDRAMINE HCL 50 MG/ML IJ SOLN
12.5000 mg | Freq: Once | INTRAMUSCULAR | Status: AC
  Administered 2023-10-28: 12.5 mg via INTRAVENOUS
  Filled 2023-10-28: qty 1

## 2023-10-28 MED ORDER — SODIUM CHLORIDE 0.9 % IV SOLN
12.5000 mg | Freq: Once | INTRAVENOUS | Status: AC
  Administered 2023-10-28: 12.5 mg via INTRAVENOUS
  Filled 2023-10-28: qty 0.5

## 2023-10-28 MED ORDER — ONDANSETRON HCL 4 MG/2ML IJ SOLN
4.0000 mg | Freq: Once | INTRAMUSCULAR | Status: AC
  Administered 2023-10-28: 4 mg via INTRAVENOUS
  Filled 2023-10-28: qty 2

## 2023-10-28 MED ORDER — MORPHINE SULFATE (PF) 4 MG/ML IV SOLN
4.0000 mg | Freq: Once | INTRAVENOUS | Status: AC
  Administered 2023-10-28: 4 mg via INTRAVENOUS
  Filled 2023-10-28: qty 1

## 2023-10-28 NOTE — ED Provider Notes (Signed)
 [place] EMERGENCY DEPARTMENT AT [facility] Provider Note   CSN: [id number] Arrival date & time: 10/28/23  1037     History  Chief Complaint  Patient presents with   Emesis    Sonya Gibson is a 38 y.o. female with a history including type 2 diabetes, hypothyroidism, hypertension PTSD and history of migraines presenting with persistent nausea and vomiting and reduced tolerance for p.o. intake.  She has a history of similar intermittent episodes, has found no patterns, describes having cramping abdominal pain without localization, no diarrhea, no constipation.  She was seen here yesterday evening for this complaint despite having been seen the day before at an outside emergency department, labs and CT scan were unremarkable during that visit.  She was prescribed a small quantity of morphine  tablets and Phenergan , she was able to get the Phenergan  filled but her pharmacy does not have the morphine  prescription.  She called in here and was told she had to be reevaluated as we could not change her prescription to another pharmacy.  She denies any new or worsening symptoms, but simply unchanged symptoms.  Denies fevers or chills.  The history is provided by the patient.       Home Medications Prior to Admission medications   Medication Sig Start Date End Date Taking? Authorizing Provider  acetaminophen  (TYLENOL ) 325 MG tablet Take 650 mg by mouth every 6 (six) hours as needed.    [provider]  albuterol  (VENTOLIN  HFA) 108 (90 Base) MCG/ACT inhaler Inhale 2 puffs into the lungs every 6 (six) hours as needed for wheezing or shortness of breath.    [provider]  amphetamine -dextroamphetamine  (ADDERALL XR) 30 MG 24 hr capsule Take 1 capsule (30 mg total) by mouth daily. 09/12/23   Mozingo, Regina Nattalie, NP  amphetamine -dextroamphetamine  (ADDERALL XR) 30 MG 24 hr capsule Take 1 capsule (30 mg total) by mouth daily. 11/07/23   Mozingo, Regina Nattalie, NP   amphetamine -dextroamphetamine  (ADDERALL XR) 30 MG 24 hr capsule Take 1 capsule (30 mg total) by mouth daily. 10/18/23   Mozingo, Regina Nattalie, NP  ARIPiprazole  (ABILIFY ) 2 MG tablet Take 1 tablet (2 mg total) by mouth 2 (two) times daily. 09/12/23   Mozingo, Regina Nattalie, NP  botulinum toxin Type A  (BOTOX ) 200 units injection Provider to inject 155 units into the muscles of the head and neck every 12 weeks. Discard remainder. 10/03/22   Glory Larsen, MD  calcium  carbonate (TUMS EX) 750 MG chewable tablet Chew 2 tablets by mouth as needed for heartburn.    [provider]  celecoxib  (CELEBREX ) 200 MG capsule Take 200 mg by mouth daily.    [provider]  CHERRY PO Take 1 tablet by mouth daily.    [provider]  DULoxetine  (CYMBALTA ) 60 MG capsule Take 2 capsules (120 mg total) by mouth daily. 09/12/23   Mozingo, Regina Nattalie, NP  fluconazole  (DIFLUCAN ) 150 MG tablet Take 150 mg by mouth once. 08/10/23   [provider]  gabapentin  (NEURONTIN ) 600 MG tablet Take 1,200 mg by mouth 3 (three) times daily.    [provider]  LORazepam  (ATIVAN ) 0.5 MG tablet TAKE 1 TABLET BY MOUTH TWICE DAILY AS NEEDED FOR ANXIETY. 10/11/23   Mozingo, Regina Nattalie, NP  morphine  (MSIR) 15 MG tablet Take 0.5 tablets (7.5 mg total) by mouth every 6 (six) hours as needed for severe pain (pain score 7-10). 10/28/23   Evola Hollis, PA-C  MOUNJARO 10 MG/0.5ML Pen SMARTSIG:10 Milligram(s)  SUB-Q Once a Week    [provider]  naloxone  (NARCAN ) nasal spray 4 mg/0.1 mL Place into the nose. 09/12/21   [provider]  ondansetron  (ZOFRAN -ODT) 4 MG disintegrating tablet DISSOLVE ONE TABLET UNDER THE TONGUE EVERY 8 HOURS AS NEEDED FOR NAUSEA 12/28/22   Glory Larsen, MD  oxyCODONE  (OXYCONTIN ) 10 mg 12 hr tablet Take 10 mg by mouth every 12 (twelve) hours.    [provider]  promethazine  (PHENERGAN ) 25 MG suppository Place 1 suppository (25 mg total)  rectally every 6 (six) hours as needed for nausea or vomiting. 10/27/23   Dorenda Gandy, MD  promethazine  (PHENERGAN ) 25 MG tablet Take 1 tablet (25 mg total) by mouth every 6 (six) hours as needed for nausea or vomiting. 01/10/23 08/22/23  Sonnie Dusky, PA-C  QUEtiapine  (SEROQUEL ) 100 MG tablet Take 1 tablet (100 mg total) by mouth at bedtime. 08/08/23   Mozingo, Regina Nattalie, NP  Rimegepant Sulfate (NURTEC) 75 MG TBDP Take 75 mg by mouth daily as needed. For migraines. Take as close to onset of migraine as possible. One daily maximum. 05/09/22   Glory Larsen, MD  rizatriptan  (MAXALT -MLT) 10 MG disintegrating tablet Take 1 tablet (10 mg total) by mouth as needed for migraine. May repeat in 2 hours if needed 05/12/21   Glory Larsen, MD  tiZANidine  (ZANAFLEX ) 4 MG tablet Take 1.5 tablets (6 mg total) by mouth every 8 (eight) hours. 04/13/20   [provider]  traMADol (ULTRAM-ER) 300 MG 24 hr tablet Take 300 mg by mouth daily.    [provider]  traZODone  (DESYREL ) 150 MG tablet Take 1 tablet (150 mg total) by mouth at bedtime. 09/12/23   Mozingo, Regina Nattalie, NP  levETIRAcetam  (KEPPRA ) 500 MG tablet Take 1 tablet (500 mg total) by mouth 2 (two) times daily. 07/10/18 07/12/19  Demaris Fillers, MD  metoprolol  tartrate (LOPRESSOR ) 25 MG tablet Take 0.5 tablets (12.5 mg total) by mouth 2 (two) times daily. 07/10/18 06/02/19  Demaris Fillers, MD  venlafaxine  XR (EFFEXOR -XR) 75 MG 24 hr capsule Take 75 mg by mouth at bedtime.  12/12/17 06/02/19  [provider]      Allergies    Cinnamon, Clindamycin, Coconut (cocos nucifera), Lovenox  [enoxaparin ], Nystatin, Aspartame, Niacin, Nitrofurantoin, Other, and Pregabalin    Review of Systems   Review of Systems  Constitutional:  Negative for chills and fever.  HENT:  Negative for congestion and sore throat.   Eyes: Negative.   Respiratory:  Negative for chest tightness and shortness of breath.   Cardiovascular:  Negative for chest  pain.  Gastrointestinal:  Positive for abdominal pain, nausea and vomiting. Negative for constipation and diarrhea.  Genitourinary: Negative.   Musculoskeletal:  Negative for arthralgias, joint swelling and neck pain.  Skin: Negative.  Negative for rash and wound.  Neurological:  Negative for dizziness, weakness, light-headedness, numbness and headaches.  Psychiatric/Behavioral: Negative.      Physical Exam Updated Vital Signs BP 121/84   Pulse (!) 104   Temp 98.1 F (36.7 C) (Oral)   Resp 19   Ht 5\' 1"  (1.549 m)   Wt 122.5 kg   LMP 10/23/2023   SpO2 96%   BMI 51.03 kg/m  Physical Exam Vitals and nursing note reviewed.  Constitutional:      Appearance: She is well-developed.  HENT:     Head: Normocephalic and atraumatic.     Mouth/Throat:     Mouth: Mucous membranes are dry.  Eyes:  Conjunctiva/sclera: Conjunctivae normal.  Cardiovascular:     Rate and Rhythm: Regular rhythm. Tachycardia present.     Heart sounds: Normal heart sounds.  Pulmonary:     Effort: Pulmonary effort is normal.     Breath sounds: Normal breath sounds. No wheezing.  Abdominal:     General: Bowel sounds are normal.     Palpations: Abdomen is soft.     Tenderness: There is abdominal tenderness. There is no guarding or rebound.  Musculoskeletal:        General: Normal range of motion.     Cervical back: Normal range of motion.  Skin:    General: Skin is warm and dry.  Neurological:     General: No focal deficit present.     Mental Status: She is alert.     ED Results / Procedures / Treatments   Labs (all labs ordered are listed, but only abnormal results are displayed) Labs Reviewed  COMPREHENSIVE METABOLIC PANEL WITH GFR - Abnormal; Notable for the following components:      Result Value   Potassium 3.2 (*)    Chloride 96 (*)    Glucose, Bld 163 (*)    BUN 24 (*)    Total Protein 8.3 (*)    AST 52 (*)    All other components within normal limits  CBC - Abnormal; Notable for  the following components:   Platelets 480 (*)    All other components within normal limits  LIPASE, BLOOD    EKG None  Radiology No results found.  Procedures Procedures    Medications Ordered in ED Medications  ondansetron  (ZOFRAN ) injection 4 mg (has no administration in time range)  sodium chloride  0.9 % bolus 1,000 mL (0 mLs Intravenous Stopped 10/28/23 1533)  promethazine  (PHENERGAN ) 12.5 mg in sodium chloride  0.9 % 50 mL IVPB (0 mg Intravenous Stopped 10/28/23 1429)  morphine  (PF) 4 MG/ML injection 4 mg (4 mg Intravenous Given 10/28/23 1405)  diphenhydrAMINE  (BENADRYL ) injection 12.5 mg (12.5 mg Intravenous Given 10/28/23 1445)    ED Course/ Medical Decision Making/ A&P                                 Medical Decision Making Patient with acute on chronic intermittent nausea vomiting with suspicion for cyclic vomiting syndrome, was seen here yesterday and previous day at [facility] ER at which time labs and CT abdomen pelvis imaging was unremarkable.  She returns today as her pharmacy does not have the morphine  that was prescribed from here at yesterday's visit.  She denies any new symptoms but symptoms have not improved.  She was able to pick up the Phenergan  suppositories which she has not started taking yet.  Repeat labs completed today with no significant changes, she does have a mild hypokalemia at 3.2.  She has a nonacute abdominal exam, no guarding or rebound.  No indication for repeat imaging today.  She was encouraged to follow-up with her primary provider this week and to discuss being referred to cyclic vomiting syndrome clinic as were her instructions at yesterday's visit as well.  Amount and/or Complexity of Data Reviewed External Data Reviewed: labs and radiology.    Details: Labs and CT abdomen pelvis from [facility] are completed 10/26/2023 reviewed and unremarkable. Labs: ordered.    Details: Labs today per above.  Risk Prescription drug management.           Final  Clinical Impression(s) /  ED Diagnoses Final diagnoses:  Generalized abdominal pain  Nausea and vomiting, unspecified vomiting type    Rx / DC Orders ED Discharge Orders          Ordered    morphine  (MSIR) 15 MG tablet  Every 6 hours PRN        10/28/23 1542              Brentt Fread, PA-C 10/28/23 1553    Cheyenne Cotta, MD 10/28/23 1731

## 2023-10-28 NOTE — ED Triage Notes (Addendum)
 Pt with continued emesis. Seen for same yesterday, unable to pick up pain medication due to pharmacy not having the med.

## 2023-10-28 NOTE — Discharge Instructions (Signed)
 Follow-up with your primary doctor and discuss consideration of being referred to a cyclic vomiting pain clinic per yesterday's instructions.  Start using your Phenergan  suppositories once you are home and as needed for continued nausea or vomiting.  I have sent the morphine  prescription to [facility] on [address].

## 2023-10-28 NOTE — ED Notes (Signed)
 Pt stated, "I sometimes itch with phenergan  can I get some benadryl ." EDP notified.

## 2023-10-30 ENCOUNTER — Telehealth: Payer: Self-pay | Admitting: Family Medicine

## 2023-10-30 ENCOUNTER — Encounter: Payer: Self-pay | Admitting: Gastroenterology

## 2023-10-30 NOTE — Telephone Encounter (Signed)
 Faxed pt's response to [facility], fax # [phone].

## 2023-10-30 NOTE — Telephone Encounter (Signed)
 Pt called to confirm appt details.  Appt Confirm

## 2023-10-30 NOTE — Progress Notes (Unsigned)
 11/01/23 ALL: Sonya Gibson returns for Botox . Last procedure 04/2023. She reports significant worsening in migraines since. With Botox  on board she averages 2-3 migraines per month. Off Botox  she has 2-3 per week. She was recently diagnosed with cyclic vomiting. Event proceeded by migraine. IV fluids required for three days. She continues Nurtec and rizatriptan  as needed.   05/04/2023 ALL: She returns for Botox . She reports headaches have been a little worse over the past 12 weeks. She had nerve stimulator placed and is having some pain. Nurtec does help. Masseter injections are helpful. She will journal headaches over next 12 weeks.   02/07/2023 ALL: She has been followed by Dr Tresia Fruit for Botox  therapy. 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 Nurtec works well for abortive therapy. She requests masseter injections. She has significant history of clenching requiring molar implants. She is a paramedic.     Consent Form Botulism Toxin Injection For Chronic Migraine    Reviewed orally with patient, additionally signature is on file:  Botulism toxin has been approved by the Federal drug administration for treatment of chronic migraine. Botulism toxin does not cure chronic migraine and it may not be effective in some patients.  The administration of botulism toxin is accomplished by injecting a small amount of toxin into the muscles of the neck and head. Dosage must be titrated for each individual. Any benefits resulting from botulism toxin tend to wear off after 3 months with a repeat injection required if benefit is to be maintained. Injections are usually done every 3-4 months with maximum effect peak achieved by about 2 or 3 weeks. Botulism toxin is expensive and you should be sure of what costs you will incur resulting from the injection.  The side effects of botulism toxin use for chronic migraine may include:   -Transient, and usually mild, facial weakness with facial  injections  -Transient, and usually mild, head or neck weakness with head/neck injections  -Reduction or loss of forehead facial animation due to forehead muscle weakness  -Eyelid drooping  -Dry eye  -Pain at the site of injection or bruising at the site of injection  -Double vision  -Potential unknown long term risks   Contraindications: You should not have Botox  if you are pregnant, nursing, allergic to albumin, have an infection, skin condition, or muscle weakness at the site of the injection, or have myasthenia gravis, Lambert-Eaton syndrome, or ALS.  It is also possible that as with any injection, there may be an allergic reaction or no effect from the medication. Reduced effectiveness after repeated injections is sometimes seen and rarely infection at the injection site may occur. All care will be taken to prevent these side effects. If therapy is given over a long time, atrophy and wasting in the muscle injected may occur. Occasionally the patient's become refractory to treatment because they develop antibodies to the toxin. In this event, therapy needs to be modified.  I have read the above information and consent to the administration of botulism toxin.    BOTOX  PROCEDURE NOTE FOR MIGRAINE HEADACHE  Contraindications and precautions discussed with patient(above). Aseptic procedure was observed and patient tolerated procedure. Procedure performed by Terrilyn Fick, FNP-C.   The condition has existed for more than 6 months, and pt does not have a diagnosis of ALS, Myasthenia Gravis or Lambert-Eaton Syndrome.  Risks and benefits of injections discussed and pt agrees to proceed with the procedure.  Written consent obtained  These injections are medically necessary. Pt  receives  good benefits from these injections. These injections do not cause sedations or hallucinations which the oral therapies may cause.   Description of procedure:  The patient was placed in a sitting position. The  standard protocol was used for Botox  as follows, with 5 units of Botox  injected at each site:  -Procerus muscle, midline injection  -Corrugator muscle, bilateral injection  -Frontalis muscle, bilateral injection, with 2 sites each side, medial injection was performed in the upper one third of the frontalis muscle, in the region vertical from the medial inferior edge of the superior orbital rim. The lateral injection was again in the upper one third of the forehead vertically above the lateral limbus of the cornea, 1.5 cm lateral to the medial injection site.  -Temporalis muscle injection, 4 sites, bilaterally. The first injection was 3 cm above the tragus of the ear, second injection site was 1.5 cm to 3 cm up from the first injection site in line with the tragus of the ear. The third injection site was 1.5-3 cm forward between the first 2 injection sites. The fourth injection site was 1.5 cm posterior to the second injection site. 5th site laterally in the temporalis  muscleat the level of the outer canthus.  -Occipitalis muscle injection, 3 sites, bilaterally. The first injection was done one half way between the occipital protuberance and the tip of the mastoid process behind the ear. The second injection site was done lateral and superior to the first, 1 fingerbreadth from the first injection. The third injection site was 1 fingerbreadth superiorly and medially from the first injection site.  -Cervical paraspinal muscle injection, 2 sites, bilaterally. The first injection site was 1 cm from the midline of the cervical spine, 3 cm inferior to the lower border of the occipital protuberance. The second injection site was 1.5 cm superiorly and laterally to the first injection site.  -Trapezius muscle injection was performed at 3 sites, bilaterally. The first injection site was in the upper trapezius muscle halfway between the inflection point of the neck, and the acromion. The second injection site was  one half way between the acromion and the first injection site. The third injection was done between the first injection site and the inflection point of the neck.  - Masseter muscle injections bilaterally 5 units each    Will return for repeat injection in 3 months.   A total of 200 units of Botox  was prepared, 165 units of Botox  was injected as documented above, any Botox  not injected was wasted. The patient tolerated the procedure well, there were no complications of the above procedure.

## 2023-11-01 ENCOUNTER — Ambulatory Visit: Admitting: Family Medicine

## 2023-11-01 VITALS — Ht 61.0 in | Wt 278.0 lb

## 2023-11-01 DIAGNOSIS — G43709 Chronic migraine without aura, not intractable, without status migrainosus: Secondary | ICD-10-CM

## 2023-11-01 MED ORDER — ONABOTULINUMTOXINA 200 UNITS IJ SOLR
155.0000 [IU] | Freq: Once | INTRAMUSCULAR | Status: DC
Start: 2023-11-01 — End: 2024-04-24

## 2023-11-01 MED ORDER — NURTEC 75 MG PO TBDP: 75.0000 mg | ORAL_TABLET | Freq: Every day | ORAL | 6 refills | Status: AC | PRN

## 2023-11-01 NOTE — Progress Notes (Signed)
 Botox -200U x 1vial Lot: [id number] Expiration:09/2025 NDC: 0023-3921-02  Bacteriostatic 0.9% Sodium Chloride - 4mL total [id number] Expiration:04/20/2024 NDC: 4098-1191-47  Specialty pharmacy  Witnessed by: Thamas Fillers, CMA

## 2023-11-02 ENCOUNTER — Telehealth: Payer: Self-pay | Admitting: Pharmacist

## 2023-11-02 ENCOUNTER — Other Ambulatory Visit (HOSPITAL_COMMUNITY): Payer: Self-pay

## 2023-11-02 NOTE — Telephone Encounter (Signed)
 Pharmacy Patient Advocate Encounter   Received notification from Patient Pharmacy that prior authorization for Nurtec 75MG  dispersible tablets is required/requested.   Insurance verification completed.   The patient is insured through [employer] .   Per test claim: The current 8/30 day co-pay is, $0.00.  No PA needed at this time. This test claim was processed through [facility]- copay amounts may vary at other pharmacies due to pharmacy/plan contracts, or as the patient moves through the different stages of their insurance plan.     Reached out to pharmacy to process 8 tablets per 30 days.

## 2023-11-09 ENCOUNTER — Telehealth: Payer: Self-pay | Admitting: Family Medicine

## 2023-11-09 NOTE — Telephone Encounter (Signed)
 LVM and sent mychart msg informing pt of need to reschedule 02/05/24 appt - NP out   If patient calls back I have held a slot for her with Amy for 01/29/24 at 8:30am

## 2023-11-16 NOTE — Telephone Encounter (Signed)
 Auth#: [id number] (10/17/23-10/15/24)

## 2023-11-16 NOTE — Telephone Encounter (Signed)
 LVM x2 informing pt of r/s needed and slot held for 8/11 @ 8:30 am.

## 2023-11-30 NOTE — Progress Notes (Addendum)
 Sonya Gibson [id number] 01-13-1986   Chief Complaint: Abdominal pain, nausea, vomiting  Referring Provider: Claudis Gibson [facility] MD: Sonya Gibson  HPI: Sonya Gibson is a 38 y.o. female with past medical history of T2DM, GERD, hypothyroidism, migraines, HTN, chronic back pain, chronic opioid use, drug-seeking behavior, anxiety/depression, PTSD who presents today for ED follow-up.  10/26/2023 patient seen at [facility] ED for nausea, vomiting, and upper abdominal pain.  Actively vomiting in the ED. Labs and imaging were unremarkable.  ED provider suspected drug withdrawal as patient is on narcotics and benzos for multiple medical problems.  10/27/2023 patient seen at [facility], ED for a few days of severe abdominal pain, nausea, vomiting, p.o. intolerance.  Reported that after discharge from other facility, symptoms persisted with inability to take anything orally.  Patient received continuous monitoring, droperidol , fluids.  Labs notable for mild dehydration, mild hypokalemia, otherwise reassuring.  Cyclic vomiting syndrome was considered as well.  10/28/2023 patient seen again at [facility] ED with persistent nausea and vomiting and reduced tolerance for p.o. intake.  Had been prescribed small quantity of morphine  tablets and Phenergan  the day before, was able to get the Phenergan  filled but pharmacy did not have the morphine  prescription and she was told she had to be reevaluated as prescription could not be sent to another pharmacy.  Patient denied any worsening of symptoms.  Was prescribed small quantity of morphine .  Labs 11/15/2023: Normal CBC, low vitamin B12 134, elevated glucose, otherwise normal CMP, hyperlipidemia, TSH 5.498, low vitamin D 19.8  11/29/2023 saw endocrinologist with recommendation to reduce Mounjaro to 7.5mg  for a few weeks before consider going back to 10mg  dose.   Patient here today with her husband.  States she has had episodes of nausea and vomiting her  whole life, even as a child.  Previously she would have predictable episodes of nausea and vomiting 1-2 times a month.  States she will suddenly feel horrible, have severe nausea and will vomit for a couple of days.  Can take a while for her to feel back to normal but she does have good days in between episodes of vomiting.  Recently, however, these episodes of vomiting have been more frequent, and she is now vomiting 2-3 times a week.  States that the attacks feel worse than they did before.  The last time she had vomiting she could not even keep ice chips down.  She can have vomiting at night, states she wakes up covered in vomit.  When she feels an episode coming on she will take Zofran , Phenergan , sometimes suppositories, but if these do not break the cycle she ends up with a couple days of vomiting.  She reports having GI evaluation for this in the past.  Had a colonoscopy around 2017/2018 due to rectal bleeding and states she was found to have diverticulosis but otherwise colonoscopy was normal.  This was done in [place].  She also had previous swallowing study and upper endoscopy at [facility] 05/2021.  States there was a finding of maybe some slight narrowing in her esophagus which was not dilated, otherwise normal.  States she just saw her endocrinologist 2 days ago.  She has been on Mounjaro for well over a year and this has really been helping her to lose weight and manage her diabetes.  Her hemoglobin A1c has significantly improved.  She would like to stay on Mounjaro, but they are debating whether to try something different due to her nausea and  vomiting.  She is currently on 7.5 mg dose rather than 10 mg for a few weeks to see if there is improvement.  Also states she was just started on Jardiance and levothyroxine , with plans for repeat TSH in about 5 weeks.  She has never had a gastric emptying study.  States that her sister has gastroparesis.  Sometimes she notices that if she  eats meat or salad, she can vomit 2 days later and emesis will include undigested food.  She reports chronic constipation.  Can go up to 2-1/2 weeks without a bowel movement.  Frequently will do a MiraLAX  purge, states she does this about once a month.  Otherwise she is not on anything consistently for constipation.  She can have some bright red rectal bleeding when she passes a large/hard stool.  This has been ongoing for years and was a problem prior to her last colonoscopy.  She denies hemorrhoids, but occasionally will have some rectal pain with a large bowel movement.  She has history of spinal injury.  States she has had 6 surgeries and had adverse event during surgery in which she ended up with a CSF leak and was in ICU for 3 months.  She is now on chronic pain medication.  She has a niece with Crohn's disease.  Former cigarette smoker, quit in 2006.  Denies marijuana use.  Previous GI Procedures/Imaging   CT A/P 10/26/2023 No acute process in the abdomen or pelvis  RUQ US  08/22/2023 1. No acute finding by ultrasound. 2. Probable hepatic steatosis.  CT A/P 08/21/2023 1. 2.6 cm diameter simple cyst within the right adnexa, likely ovarian in origin. No follow-up imaging is recommended. This recommendation follows ACR consensus guidelines: White Paper of the ACR Incidental Findings Committee II on Adnexal Findings. J Am Coll Radiol 2013:10:675-681. 2. Findings which may represent sequelae associated with cystitis. Correlation with urinalysis is recommended.  Past Medical History:  Diagnosis Date   Anxiety    Chronic back pain    Depression    Diabetes (HCC)    Drug-seeking behavior    Essential hypertension    GERD (gastroesophageal reflux disease)    Hypothyroidism    Kidney stone    Lumbar radiculopathy, right    Migraine headache    PTSD (post-traumatic stress disorder)     Past Surgical History:  Procedure Laterality Date   ANTERIOR CRUCIATE LIGAMENT REPAIR Left     BACK SURGERY  12/05/2017   CESAREAN SECTION     IR EPIDUROGRAPHY  11/17/2017   KNEE SURGERY     LUMBAR LAMINECTOMY/DECOMPRESSION MICRODISCECTOMY Right 12/05/2017   Procedure: Right L4-5 Microdiskectomy;  Surgeon: Joshua Alm RAMAN, MD;  Location: [facility] OR;  Service: Neurosurgery;  Laterality: Right;   LUMBAR LAMINECTOMY/DECOMPRESSION MICRODISCECTOMY N/A 12/20/2017   Procedure: Repair of CSF Leak;  Surgeon: Joshua Alm RAMAN, MD;  Location: [facility] OR;  Service: Neurosurgery;  Laterality: N/A;   LUMBAR WOUND DEBRIDEMENT N/A 12/27/2017   Procedure: Re-exploration for CSF leak repair and placement of lumbar drain;  Surgeon: Joshua Alm RAMAN, MD;  Location: [facility] OR;  Service: Neurosurgery;  Laterality: N/A;   middle finger reatachment     PLACEMENT OF LUMBAR DRAIN N/A 12/27/2017   Procedure: PLACEMENT OF LUMBAR DRAIN;  Surgeon: Joshua Alm RAMAN, MD;  Location: [facility] OR;  Service: Neurosurgery;  Laterality: N/A;   SPINAL CORD STIMULATOR IMPLANT  03/31/2023   TONSILLECTOMY AND ADENOIDECTOMY  1996   VENTRICULOPERITONEAL SHUNT N/A 01/08/2018   Procedure: Re-exploration and repair of previous  Lumbar cerebro-spinal fluid leak and placement of Lumbar drain;  Surgeon: Joshua Alm RAMAN, MD;  Location: [facility] OR;  Service: Neurosurgery;  Laterality: N/A;    Current Outpatient Medications  Medication Sig Dispense Refill   acetaminophen  (TYLENOL ) 325 MG tablet Take 650 mg by mouth every 6 (six) hours as needed.     albuterol  (VENTOLIN  HFA) 108 (90 Base) MCG/ACT inhaler Inhale 2 puffs into the lungs every 6 (six) hours as needed for wheezing or shortness of breath.     amphetamine -dextroamphetamine  (ADDERALL XR) 30 MG 24 hr capsule Take 1 capsule (30 mg total) by mouth daily. 30 capsule 0   amphetamine -dextroamphetamine  (ADDERALL XR) 30 MG 24 hr capsule Take 1 capsule (30 mg total) by mouth daily. 30 capsule 0   amphetamine -dextroamphetamine  (ADDERALL XR) 30 MG 24 hr capsule Take 1 capsule (30 mg total) by mouth daily. 30 capsule 0    ARIPiprazole  (ABILIFY ) 2 MG tablet Take 1 tablet (2 mg total) by mouth 2 (two) times daily. 60 tablet 2   botulinum toxin Type A  (BOTOX ) 200 units injection Provider to inject 155 units into the muscles of the head and neck every 12 weeks. Discard remainder. 1 each 3   calcium  carbonate (TUMS EX) 750 MG chewable tablet Chew 2 tablets by mouth as needed for heartburn.     celecoxib  (CELEBREX ) 200 MG capsule Take 200 mg by mouth daily.     CHERRY PO Take 1 tablet by mouth daily.     DULoxetine  (CYMBALTA ) 60 MG capsule Take 2 capsules (120 mg total) by mouth daily. 60 capsule 5   fluconazole  (DIFLUCAN ) 150 MG tablet Take 150 mg by mouth once.     gabapentin  (NEURONTIN ) 600 MG tablet Take 1,200 mg by mouth 3 (three) times daily.     LORazepam  (ATIVAN ) 0.5 MG tablet TAKE 1 TABLET BY MOUTH TWICE DAILY AS NEEDED FOR ANXIETY. 60 tablet 0   morphine  (MSIR) 15 MG tablet Take 0.5 tablets (7.5 mg total) by mouth every 6 (six) hours as needed for severe pain (pain score 7-10). 10 tablet 0   MOUNJARO 10 MG/0.5ML Pen SMARTSIG:10 Milligram(s) SUB-Q Once a Week     naloxone  (NARCAN ) nasal spray 4 mg/0.1 mL Place into the nose.     ondansetron  (ZOFRAN -ODT) 4 MG disintegrating tablet DISSOLVE ONE TABLET UNDER THE TONGUE EVERY 8 HOURS AS NEEDED FOR NAUSEA 30 tablet 0   oxyCODONE  (OXYCONTIN ) 10 mg 12 hr tablet Take 10 mg by mouth every 12 (twelve) hours.     promethazine  (PHENERGAN ) 25 MG suppository Place 1 suppository (25 mg total) rectally every 6 (six) hours as needed for nausea or vomiting. 12 each 0   promethazine  (PHENERGAN ) 25 MG tablet Take 1 tablet (25 mg total) by mouth every 6 (six) hours as needed for nausea or vomiting. 120 tablet 0   QUEtiapine  (SEROQUEL ) 100 MG tablet Take 1 tablet (100 mg total) by mouth at bedtime. 30 tablet 5   Rimegepant Sulfate (NURTEC) 75 MG TBDP Take 1 tablet (75 mg total) by mouth daily as needed. For migraines. Take as close to onset of migraine as possible. One daily maximum.  10 tablet 6   rizatriptan  (MAXALT -MLT) 10 MG disintegrating tablet Take 1 tablet (10 mg total) by mouth as needed for migraine. May repeat in 2 hours if needed 9 tablet 11   tiZANidine  (ZANAFLEX ) 4 MG tablet Take 1.5 tablets (6 mg total) by mouth every 8 (eight) hours.     traMADol (ULTRAM-ER) 300 MG 24  hr tablet Take 300 mg by mouth daily.     traZODone  (DESYREL ) 150 MG tablet Take 1 tablet (150 mg total) by mouth at bedtime. 30 tablet 5   Current Facility-Administered Medications  Medication Dose Route Frequency Provider Last Rate Last Admin   botulinum toxin Type A  (BOTOX ) injection 155 Units  155 Units Intramuscular Once Lomax, Amy, NP        Allergies as of 12/01/2023 - Review Complete 11/01/2023  Allergen Reaction Noted   Cinnamon Anaphylaxis 12/20/2017   Clindamycin Nausea And Vomiting 10/11/2019   Coconut (cocos nucifera) Anaphylaxis 08/01/2016   Lovenox  [enoxaparin ] Other (See Comments) 12/20/2017   Nystatin Anaphylaxis 01/23/2019   Aspartame  06/09/2020   Niacin Hives 02/12/2021   Nitrofurantoin Hives 04/26/2010   Other  03/26/2018   Pregabalin  06/06/2020    Family History  Problem Relation Age of Onset   Diabetes Mellitus II Mother    Hypertension Mother    Seizures Mother    Other Mother        post stroke headaches   Stroke Mother    Hypertension Father    Diabetes Mellitus II Father    Hyperlipidemia Father    Bipolar disorder Sister    Asthma Son    Autism Son    Tourette syndrome Son    Autism Daughter        level 3   Other Daughter        tibia torsion & femoral aniversion    Social History   Tobacco Use   Smoking status: Former    Current packs/day: 0.00    Average packs/day: 2.0 packs/day for 3.0 years (6.0 ttl pk-yrs)    Types: Cigarettes    Start date: 2003    Quit date: 2006    Years since quitting: 19.4   Smokeless tobacco: Never  Vaping Use   Vaping status: Never Used  Substance Use Topics   Alcohol use: Yes    Comment: socially    Drug use: No     Review of Systems:    Constitutional: No weight loss, fever, chills, weakness or fatigue Skin: No rash or itching Cardiovascular: No chest pain, chest pressure or palpitations   Respiratory: No SOB or cough Gastrointestinal: See HPI and otherwise negative Genitourinary: No dysuria or change in urinary frequency Neurological: No headache, dizziness or syncope Musculoskeletal: No new muscle or joint pain Hematologic: No bleeding or bruising    Physical Exam:  Vital signs: BP 124/84 Comment: large cuff  Pulse (!) 112   Ht 5' 1 (1.549 m)   Wt 278 lb (126.1 kg)   BMI 52.53 kg/m    Constitutional: NAD, obese, alert and cooperative Head:  Normocephalic and atraumatic.  Eyes: No scleral icterus. Conjunctiva pink. Mouth: No oral lesions. Respiratory: Respirations even and unlabored. Lungs clear to auscultation bilaterally.  No wheezes, crackles, or rhonchi.  Cardiovascular:  Regular rate and rhythm. No murmurs. No peripheral edema. Gastrointestinal:  Soft, nondistended, nontender. No rebound or guarding. Normal bowel sounds. No appreciable masses or hepatomegaly. Rectal:  No external hemorrhoids or fissures seen. Normal rectal tone. No stool in rectum. No visible bleeding. Neurologic:  Alert and oriented x4;  grossly normal neurologically.  Skin:   Dry and intact without significant lesions or rashes. Psychiatric: Oriented to person, place and time. Demonstrates good judgement and reason without abnormal affect or behaviors.   RELEVANT LABS AND IMAGING: CBC    Component Value Date/Time   WBC 5.8 10/28/2023 1128  RBC 4.47 10/28/2023 1128   HGB 13.8 10/28/2023 1128   HCT 40.1 10/28/2023 1128   PLT 480 (H) 10/28/2023 1128   MCV 89.7 10/28/2023 1128   MCH 30.9 10/28/2023 1128   MCHC 34.4 10/28/2023 1128   RDW 14.9 10/28/2023 1128   LYMPHSABS 4.1 (H) 10/17/2022 1747   MONOABS 0.4 10/17/2022 1747   EOSABS 0.2 10/17/2022 1747   BASOSABS 0.1 10/17/2022 1747     CMP     Component Value Date/Time   NA 135 10/28/2023 1128   K 3.2 (L) 10/28/2023 1128   CL 96 (L) 10/28/2023 1128   CO2 27 10/28/2023 1128   GLUCOSE 163 (H) 10/28/2023 1128   BUN 24 (H) 10/28/2023 1128   CREATININE 0.95 10/28/2023 1128   CALCIUM  9.4 10/28/2023 1128   PROT 8.3 (H) 10/28/2023 1128   ALBUMIN 4.6 10/28/2023 1128   AST 52 (H) 10/28/2023 1128   ALT 22 10/28/2023 1128   ALKPHOS 65 10/28/2023 1128   BILITOT 0.7 10/28/2023 1128   GFRNONAA >60 10/28/2023 1128   GFRAA >60 02/21/2020 1033     Assessment/Plan:   Nausea and vomiting Constipation Rectal bleeding T2DM on Mounjaro Patient with episodes of nausea and vomiting ongoing since she was a child, which previously would occur 1-2 times per month but has recently worsened and she is having vomiting 2-3 times per week.  Has previously had GI workup to include barium swallow, EGD, colonoscopy. Recent workup in the ED unremarkable with no concerning findings on CT. She is currently on 7.5 mg Mounjaro which has been very helpful with weight loss and in lowering her hemoglobin A1c, though may be contributing to worsening nausea and vomiting, particularly at higher 10mg  dose. Patient reports that she will sometimes vomit undigested food which she has eaten 1 to 2 days before.   She has chronic constipation, can go up to 2.5 weeks without a bowel movement.  Does a MiraLAX  purge about once a month but is not on any maintenance medication or OTC laxative for constipation.  May have some bright red bleeding with hard stools and a large bowel movement.  States bleeding has been occurring intermittently since before her last colonoscopy in 2017 and is unchanged.  Patient's sister has gastroparesis and chronic constipation and is on [place].  Though patient has history of what sounds like cyclic vomiting syndrome, the cause of these symptoms could be multifactorial.  Possibility of gastroparesis due to T2DM/GLP-1 use.  Chronic  opioid use and hypothyroidism likely contributing to constipation.   - Order gastric emptying study to evaluate for gastroparesis  - Labs today: CBC, CMP, TTG, IgA - Start Linzess  145 mcg once daily. May increase dose if inadequate response. - Give samples of Calmol suppositories. - Request past procedure records.  - Note that patient has BMI over 50 and any future endoscopic procedures will need to be done in hospital setting.   Addendum 12/08/2023: Past GI records received.  EGD 04/12/2013 - Mild esophagitis - Small hiatal hernia Path: Small bowel, biopsy: Histologically unremarkable duodenal mucosa.   No changes of sprue, active inflammation, or granulomas identified. Clotest negative for H. pylori  Colonoscopy 04/12/2013 - Mild diverticular disease of the right colon Path: Terminal ileum, biopsy Benign small bowel mucosa No active inflammation or granulomas identified Right colon, biopsy Benign colonic mucosa No microscopic colitis, active inflammation, granulomas, adenomatous or malignant changes identified Left colon, biopsy Benign colonic mucosa No microscopic colitis, active inflammation, granulomas, adenomatous or malignant changes identified  EGD 11/25/2020 - Normal esophagus.  Biopsied - Z-line regular, 37 cm from the incisors. - Normal stomach. - Normal examined duodenum. Path: - Histologically unremarkable esophageal squamous mucosa, no intraepithelial eosinophils identified   Sonya Furbish, PA-C [facility] 11/30/2023, 3:51 PM  Patient Care Team: [place], [facility] as PCP - General Bobbette Phy, MD as Rounding Team (Internal Medicine)

## 2023-12-01 ENCOUNTER — Other Ambulatory Visit (INDEPENDENT_AMBULATORY_CARE_PROVIDER_SITE_OTHER)

## 2023-12-01 ENCOUNTER — Encounter: Payer: Self-pay | Admitting: Gastroenterology

## 2023-12-01 ENCOUNTER — Ambulatory Visit (INDEPENDENT_AMBULATORY_CARE_PROVIDER_SITE_OTHER): Admitting: Gastroenterology

## 2023-12-01 VITALS — BP 124/84 | HR 112 | Ht 61.0 in | Wt 278.0 lb

## 2023-12-01 DIAGNOSIS — K625 Hemorrhage of anus and rectum: Secondary | ICD-10-CM

## 2023-12-01 DIAGNOSIS — K5909 Other constipation: Secondary | ICD-10-CM

## 2023-12-01 DIAGNOSIS — R112 Nausea with vomiting, unspecified: Secondary | ICD-10-CM | POA: Diagnosis not present

## 2023-12-01 LAB — COMPREHENSIVE METABOLIC PANEL WITH GFR
ALT: 15 U/L (ref 0–35)
AST: 11 U/L (ref 0–37)
Albumin: 4.7 g/dL (ref 3.5–5.2)
Alkaline Phosphatase: 79 U/L (ref 39–117)
BUN: 10 mg/dL (ref 6–23)
CO2: 25 meq/L (ref 19–32)
Calcium: 9.4 mg/dL (ref 8.4–10.5)
Chloride: 102 meq/L (ref 96–112)
Creatinine, Ser: 0.88 mg/dL (ref 0.40–1.20)
GFR: 83.41 mL/min (ref >60.00)
Glucose, Bld: 127 mg/dL — ABNORMAL HIGH (ref 70–99)
Potassium: 4 meq/L (ref 3.5–5.1)
Sodium: 137 meq/L (ref 135–145)
Total Bilirubin: 0.5 mg/dL (ref 0.2–1.2)
Total Protein: 8.1 g/dL (ref 6.0–8.3)

## 2023-12-01 LAB — CBC WITH DIFFERENTIAL/PLATELET
Basophils Absolute: 0.1 10*3/uL (ref 0.0–0.1)
Basophils Relative: 1.1 % (ref 0.0–3.0)
Eosinophils Absolute: 0.2 10*3/uL (ref 0.0–0.7)
Eosinophils Relative: 2.2 % (ref 0.0–5.0)
HCT: 40.2 % (ref 36.0–46.0)
Hemoglobin: 13.6 g/dL (ref 12.0–15.0)
Lymphocytes Relative: 33 % (ref 12.0–46.0)
Lymphs Abs: 3 10*3/uL (ref 0.7–4.0)
MCHC: 33.8 g/dL (ref 30.0–36.0)
MCV: 88.4 fl (ref 78.0–100.0)
Monocytes Absolute: 0.5 10*3/uL (ref 0.1–1.0)
Monocytes Relative: 5.1 % (ref 3.0–12.0)
Neutro Abs: 5.2 10*3/uL (ref 1.4–7.7)
Neutrophils Relative %: 58.6 % (ref 43.0–77.0)
Platelets: 445 10*3/uL — ABNORMAL HIGH (ref 150.0–400.0)
RBC: 4.55 Mil/uL (ref 3.87–5.11)
RDW: 15.5 % (ref 11.5–15.5)
WBC: 9 10*3/uL (ref 4.0–10.5)

## 2023-12-01 MED ORDER — CALMOL-4 76-10 % RE SUPP: RECTAL | Status: AC

## 2023-12-01 MED ORDER — LINACLOTIDE 145 MCG PO CAPS: 145.0000 ug | ORAL_CAPSULE | Freq: Every day | ORAL | 3 refills | Status: DC

## 2023-12-01 NOTE — Patient Instructions (Signed)
 Your provider has requested that you go to the basement level for lab work before leaving today. Press B on the elevator. The lab is located at the first door on the left as you exit the elevator.  Due to recent changes in healthcare laws, you may see the results of your imaging and laboratory studies on MyChart before your provider has had a chance to review them.  We understand that in some cases there may be results that are confusing or concerning to you. Not all laboratory results come back in the same time frame and the provider may be waiting for multiple results in order to interpret others.  Please give us  48 hours in order for your provider to thoroughly review all the results before contacting the office for clarification of your results.   We have sent the following medications to your pharmacy for you to pick up at your convenience: Linzess 145mcg  Linzess works best when taken once a day every day, on an empty stomach, at least 30 minutes before your first meal of the day.  When Linzess is taken daily as directed:  *Constipation relief is typically felt in about a week *IBS-C patients may begin to experience relief from belly pain and overall abdominal symptoms (pain, discomfort, and bloating) in about 1 week,   with symptoms typically improving over 12 weeks.  Diarrhea may occur in the first 2 weeks -keep taking it.  The diarrhea should go away and you should start having normal, complete, full bowel movements. It may be helpful to start treatment when you can be near the comfort of your own bathroom, such as a weekend.   We will try and obtain records from previous Doctors.   You have been scheduled for a gastric emptying scan at [facility] on 12/20/2023 at 8:00am. Please arrive at least 30 minutes prior to your appointment for registration. Please make certain not to have anything to eat or drink after midnight the night before your test. Hold all stomach medications  (ex: Zofran , phenergan , Reglan ) 24 hours prior to your test. If you need to reschedule your appointment, please contact radiology scheduling at [phone]. _____________________________________________________________________ A gastric-emptying study measures how long it takes for food to move through your stomach. There are several ways to measure stomach emptying. In the most common test, you eat food that contains a small amount of radioactive material. A scanner that detects the movement of the radioactive material is placed over your abdomen to monitor the rate at which food leaves your stomach. This test normally takes about 4 hours to complete. _____________________________________________________________________  I appreciate the opportunity to care for you. Valiant Gaul, PA

## 2023-12-01 NOTE — Progress Notes (Signed)
 Attending Physician's Attestation   I have reviewed the chart.   I agree with the Advanced Practitioner's note, impression, and recommendations with any updates as below. Recommend a.m. cortisol be drawn as well if possible to evaluate for adrenal insufficiency. Before patient's solid food gastric emptying study, would recommend trying to hold Mounjaro for at least 1 week before the emptying study if possible as well as not using any antiemetics for at least 24 hours if possible.     Yong Henle, MD [facility] Advanced Endoscopy Office # [phone]

## 2023-12-02 LAB — IGA: Immunoglobulin A: 305 mg/dL (ref 47–310)

## 2023-12-02 LAB — TISSUE TRANSGLUTAMINASE, IGA: (tTG) Ab, IgA: <1 U/mL

## 2023-12-04 ENCOUNTER — Telehealth: Payer: Self-pay | Admitting: Gastroenterology

## 2023-12-04 ENCOUNTER — Ambulatory Visit: Payer: Self-pay | Admitting: Gastroenterology

## 2023-12-04 DIAGNOSIS — R112 Nausea with vomiting, unspecified: Secondary | ICD-10-CM

## 2023-12-04 DIAGNOSIS — K5909 Other constipation: Secondary | ICD-10-CM

## 2023-12-04 NOTE — Telephone Encounter (Signed)
 The pt has been advised and lab order entered. She prefers to have done at [facility] in her hometown. Order sent to [facility] the [facility].  She has also been instructed to hold mounjaro and anti emetics as directed.

## 2023-12-04 NOTE — Telephone Encounter (Signed)
 Left message on machine to call back

## 2023-12-04 NOTE — Telephone Encounter (Signed)
 Please reach out to patient regarding the following recommendations from Dr. Brice Campi: Before patient's solid food gastric emptying study, would recommend trying to hold Mounjaro for at least 1 week before the emptying study if possible as well as not using any antiemetics for at least 24 hours if possible.  He has also recommended we check an A.M. cortisol to evaluate for adrenal insufficiency. Please see if patient would be willing to do this, thank you.

## 2023-12-20 ENCOUNTER — Other Ambulatory Visit: Payer: Self-pay | Admitting: Adult Health

## 2023-12-20 ENCOUNTER — Ambulatory Visit (HOSPITAL_COMMUNITY)

## 2023-12-20 DIAGNOSIS — G4700 Insomnia, unspecified: Secondary | ICD-10-CM

## 2023-12-26 ENCOUNTER — Encounter: Payer: Self-pay | Admitting: Adult Health

## 2023-12-26 ENCOUNTER — Telehealth: Admitting: Adult Health

## 2023-12-26 DIAGNOSIS — G4700 Insomnia, unspecified: Secondary | ICD-10-CM

## 2023-12-26 DIAGNOSIS — F902 Attention-deficit hyperactivity disorder, combined type: Secondary | ICD-10-CM

## 2023-12-26 DIAGNOSIS — F410 Panic disorder [episodic paroxysmal anxiety] without agoraphobia: Secondary | ICD-10-CM

## 2023-12-26 DIAGNOSIS — F411 Generalized anxiety disorder: Secondary | ICD-10-CM

## 2023-12-26 MED ORDER — QUETIAPINE FUMARATE 200 MG PO TABS
200.0000 mg | ORAL_TABLET | Freq: Every day | ORAL | 2 refills | Status: AC
Start: 2023-12-26 — End: ?

## 2023-12-26 MED ORDER — AMPHETAMINE-DEXTROAMPHETAMINE 20 MG PO TABS: 20.0000 mg | ORAL_TABLET | Freq: Two times a day (BID) | ORAL | 0 refills | Status: DC

## 2023-12-26 MED ORDER — ARIPIPRAZOLE 2 MG PO TABS: 2.0000 mg | ORAL_TABLET | Freq: Two times a day (BID) | ORAL | 2 refills | Status: DC

## 2023-12-26 MED ORDER — LORAZEPAM 0.5 MG PO TABS: 0.5000 mg | ORAL_TABLET | Freq: Two times a day (BID) | ORAL | 2 refills | Status: DC | PRN

## 2023-12-26 NOTE — Progress Notes (Signed)
 Sonya Gibson [id number] 10-02-1985 38 y.o.  Virtual Visit via Video Note  I connected with pt @ on 12/26/23 at  4:00 PM EDT by a video enabled telemedicine application and verified that I am speaking with the correct person using two identifiers.   I discussed the limitations of evaluation and management by telemedicine and the availability of in person appointments. The patient expressed understanding and agreed to proceed.  I discussed the assessment and treatment plan with the patient. The patient was provided an opportunity to ask questions and all were answered. The patient agreed with the plan and demonstrated an understanding of the instructions.   The patient was advised to call back or seek an in-person evaluation if the symptoms worsen or if the condition fails to improve as anticipated.  I provided 25 minutes of non-face-to-face time during this encounter.  The patient was located at home.  The provider was located at [facility].   Sonya Sonya Sayers, NP   Subjective:   Patient ID:  Sonya Gibson is a 38 y.o. (DOB 28-Oct-1985) female.  Chief Complaint: No chief complaint on file.   HPI Sonya Gibson presents for follow-up of PTSD, panic attacks, obsessional thoughts and acts, depression, ADD - combined type and bipolar disorder - 2.  Describes mood today as not the best. Pleasant. Reports tearfulness. Mood symptoms - reports depression, anxiety and irritability. Reports decreased interest and motivation. Reports panic attacks. Reports flash backs. Reports outbursts. Reports worry, rumination and over thinking. Reports obsessive thoughts and acts. Reports mood as lower. Stating I feel I'm just making it. Taking medications as prescribed, but is willing to consider other options for mood symptoms.  Appetite adequate. Weight stable - 260 pounds - [place] - A1C improved. Reports sleeping well most nights. Averages 6 to 8 hours. Denies excessive daytime  napping. Reports focus and concentration difficulties. Taking Adderall 5 out of 7 days. Diagnosed with ADD in childhood. Completing tasks. Managing aspects of household. Reports being approved for LTD through her employer. Plans to continue to pursue SSD benefits. Denies SI or HI.  Denies AH. Denies VH. Denies self harm. Denies substance use.   Previous medication trials:  Buspar, Hydroxyzine.   Review of Systems:  Review of Systems  Musculoskeletal:  Negative for gait problem.  Neurological:  Negative for tremors.  Psychiatric/Behavioral:         Please refer to HPI    Medications: I have reviewed the patient's current medications.  Current Outpatient Medications  Medication Sig Dispense Refill   amphetamine -dextroamphetamine  (ADDERALL) 20 MG tablet Take 1 tablet (20 mg total) by mouth 2 (two) times daily. 60 tablet 0   acetaminophen  (TYLENOL ) 325 MG tablet Take 650 mg by mouth every 6 (six) hours as needed.     albuterol  (VENTOLIN  HFA) 108 (90 Base) MCG/ACT inhaler Inhale 2 puffs into the lungs every 6 (six) hours as needed for wheezing or shortness of breath.     ARIPiprazole  (ABILIFY ) 2 MG tablet Take 1 tablet (2 mg total) by mouth 2 (two) times daily. 60 tablet 2   botulinum toxin Type A  (BOTOX ) 200 units injection Provider to inject 155 units into the muscles of the head and neck every 12 weeks. Discard remainder. 1 each 3   calcium  carbonate (TUMS EX) 750 MG chewable tablet Chew 2 tablets by mouth as needed for heartburn.     celecoxib  (CELEBREX ) 200 MG capsule Take 200 mg by mouth daily.     CHERRY PO Take  1 tablet by mouth daily.     DULoxetine  (CYMBALTA ) 60 MG capsule Take 2 capsules (120 mg total) by mouth daily. 60 capsule 5   fluconazole  (DIFLUCAN ) 150 MG tablet Take 150 mg by mouth once.     gabapentin  (NEURONTIN ) 600 MG tablet Take 1,200 mg by mouth 3 (three) times daily.     linaclotide  (LINZESS ) 145 MCG CAPS capsule Take 1 capsule (145 mcg total) by mouth daily  before breakfast. 30 capsule 3   LORazepam  (ATIVAN ) 0.5 MG tablet Take 1 tablet (0.5 mg total) by mouth 2 (two) times daily as needed. for anxiety 60 tablet 2   morphine  (MSIR) 15 MG tablet Take 0.5 tablets (7.5 mg total) by mouth every 6 (six) hours as needed for severe pain (pain score 7-10). 10 tablet 0   MOUNJARO 10 MG/0.5ML Pen SMARTSIG:10 Milligram(s) SUB-Q Once a Week     naloxone  (NARCAN ) nasal spray 4 mg/0.1 mL Place into the nose.     ondansetron  (ZOFRAN -ODT) 4 MG disintegrating tablet DISSOLVE ONE TABLET UNDER THE TONGUE EVERY 8 HOURS AS NEEDED FOR NAUSEA 30 tablet 0   oxyCODONE  (OXYCONTIN ) 10 mg 12 hr tablet Take 10 mg by mouth every 12 (twelve) hours.     promethazine  (PHENERGAN ) 25 MG suppository Place 1 suppository (25 mg total) rectally every 6 (six) hours as needed for nausea or vomiting. 12 each 0   promethazine  (PHENERGAN ) 25 MG tablet Take 1 tablet (25 mg total) by mouth every 6 (six) hours as needed for nausea or vomiting. 120 tablet 0   QUEtiapine  (SEROQUEL ) 100 MG tablet Take 1 tablet (100 mg total) by mouth at bedtime. 30 tablet 5   QUEtiapine  (SEROQUEL ) 200 MG tablet Take 1 tablet (200 mg total) by mouth at bedtime. 30 tablet 2   Rectal Protectant-Emollient (CALMOL-4) 76-10 % SUPP Use as needed     Rimegepant Sulfate (NURTEC) 75 MG TBDP Take 1 tablet (75 mg total) by mouth daily as needed. For migraines. Take as close to onset of migraine as possible. One daily maximum. 10 tablet 6   rizatriptan  (MAXALT -MLT) 10 MG disintegrating tablet Take 1 tablet (10 mg total) by mouth as needed for migraine. May repeat in 2 hours if needed 9 tablet 11   tiZANidine  (ZANAFLEX ) 4 MG tablet Take 1.5 tablets (6 mg total) by mouth every 8 (eight) hours.     traMADol (ULTRAM-ER) 300 MG 24 hr tablet Take 300 mg by mouth daily.     traZODone  (DESYREL ) 150 MG tablet Take 1 tablet (150 mg total) by mouth at bedtime. 30 tablet 5   Current Facility-Administered Medications  Medication Dose Route  Frequency Provider Last Rate Last Admin   botulinum toxin Type A  (BOTOX ) injection 155 Units  155 Units Intramuscular Once Lomax, Amy, NP        Medication Side Effects: None  Allergies:  Allergies  Allergen Reactions   Cinnamon Anaphylaxis    (NO REAL/TREE BARK CINNAMON)   Clindamycin Nausea And Vomiting    Other reaction(s): gi distress Chest pain, trouble swallowing Chest pain, trouble swallowing    Coconut (Cocos Nucifera) Anaphylaxis    Can eat fake coconut   Lovenox  [Enoxaparin ] Other (See Comments)    BROKE DOWN THE SKIN AT INJECTION SITE AND CAUSED A WOUND   Nystatin Anaphylaxis   Aspartame     Other reaction(s): Other (See Comments) Breathing Difficulty, Serious intense migraines.  Breathing Difficulty, Serious intense migraines.     Niacin Hives   Nitrofurantoin Hives  Other     Headache cocktail   Blister to abd   Pregabalin     Other reaction(s): Other (See Comments) Depression with suicidal thoughts Depression with suicidal thoughts     Past Medical History:  Diagnosis Date   Anxiety    Chronic back pain    Depression    Diabetes (HCC)    Drug-seeking behavior    Essential hypertension    GERD (gastroesophageal reflux disease)    Hypothyroidism    Kidney stone    Lumbar radiculopathy, right    Migraine headache    PTSD (post-traumatic stress disorder)     Family History  Problem Relation Age of Onset   Diabetes Mellitus II Mother    Hypertension Mother    Seizures Mother    Other Mother        post stroke headaches   Stroke Mother        x 3   Pancreatic cancer Mother    Hypertension Father    Diabetes Mellitus II Father    Hyperlipidemia Father    Heart disease Father    Hearing loss Father        was a VET   Bipolar disorder Sister    Autism Daughter        level 3   Other Daughter        tibia torsion & femoral aniversion   Asthma Son    Autism Son    Tourette syndrome Son     Social History   Socioeconomic  History   Marital status: Married    Spouse name: Brad   Number of children: 3   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    Highest education level: Not on file  Occupational History   Not on file  Tobacco Use   Smoking status: Former    Current packs/day: 0.00    Average packs/day: 2.0 packs/day for 3.0 years (6.0 ttl pk-yrs)    Types: Cigarettes    Start date: 2003    Quit date: 2006    Years since quitting: 19.5   Smokeless tobacco: Never  Vaping Use   Vaping status: Never Used  Substance and Sexual Activity   Alcohol use: Yes    Comment: socially   Drug use: No   Sexual activity: Yes    Birth control/protection: None  Other Topics Concern   Not on file  Social History Narrative   Lives at home with husband and kids   Right handed   Caffeine : 6 cups daily   Social Drivers of Health   Financial Resource Strain: Low Risk  (01/03/2022)   Received from [facility]   Overall Financial Resource Strain (CARDIA)    Difficulty of Paying Living Expenses: Not very hard  Food Insecurity: Low Risk  (11/29/2023)   Received from [facility]   Hunger Vital Sign    Within the past 12 months, you worried that your food would run out before you got money to buy more: Never true    Within the past 12 months, the food you bought just didn't last and you didn't have money to get more. : Never true  Transportation Needs: No Transportation Needs (11/29/2023)   Received from [facility]    In the past 12 months, has lack of reliable transportation kept you from medical appointments, meetings, work or from getting things needed for daily living? : No  Physical Activity: Not on file  Stress: Not on  file  Social Connections: Not on file  Intimate Partner Violence: Not on file    Past Medical History, Surgical history, Social history, and Family history were reviewed and updated as appropriate.   Please see review of systems for further details on the patient's review from  today.   Objective:   Physical Exam:  There were no vitals taken for this visit.  Physical Exam Constitutional:      General: She is not in acute distress. Musculoskeletal:        General: No deformity.  Neurological:     Mental Status: She is alert and oriented to person, place, and time.     Coordination: Coordination normal.  Psychiatric:        Attention and Perception: Attention and perception normal. She does not perceive auditory or visual hallucinations.        Mood and Affect: Mood is anxious and depressed. Affect is not labile, blunt, angry or inappropriate.        Speech: Speech normal.        Behavior: Behavior normal.        Thought Content: Thought content normal. Thought content is not paranoid or delusional. Thought content does not include homicidal or suicidal ideation. Thought content does not include homicidal or suicidal plan.        Cognition and Memory: Cognition and memory normal.        Judgment: Judgment normal.     Comments: Insight intact     Lab Review:     Component Value Date/Time   NA 137 12/01/2023 1133   K 4.0 12/01/2023 1133   CL 102 12/01/2023 1133   CO2 25 12/01/2023 1133   GLUCOSE 127 (H) 12/01/2023 1133   BUN 10 12/01/2023 1133   CREATININE 0.88 12/01/2023 1133   CALCIUM  9.4 12/01/2023 1133   PROT 8.1 12/01/2023 1133   ALBUMIN 4.7 12/01/2023 1133   AST 11 12/01/2023 1133   ALT 15 12/01/2023 1133   ALKPHOS 79 12/01/2023 1133   BILITOT 0.5 12/01/2023 1133   GFRNONAA >60 10/28/2023 1128   GFRAA >60 02/21/2020 1033       Component Value Date/Time   WBC 9.0 12/01/2023 1133   RBC 4.55 12/01/2023 1133   HGB 13.6 12/01/2023 1133   HCT 40.2 12/01/2023 1133   PLT 445.0 (H) 12/01/2023 1133   MCV 88.4 12/01/2023 1133   MCH 30.9 10/28/2023 1128   MCHC 33.8 12/01/2023 1133   RDW 15.5 12/01/2023 1133   LYMPHSABS 3.0 12/01/2023 1133   MONOABS 0.5 12/01/2023 1133   EOSABS 0.2 12/01/2023 1133   BASOSABS 0.1 12/01/2023 1133    No  results found for: POCLITH, LITHIUM   No results found for: PHENYTOIN, PHENOBARB, VALPROATE, CBMZ   .res Assessment: Plan:    Plan:  PDMP reviewed  Restart 20mg  BID D/C Adderall XR 30mg  every morning  Abilify  2mg  twice daily for mood symptoms  Seroquel  100mg  at hs Trazadone 150mg  at bedtime - typically 75mg  at hs Cymbalta  60mg  - 2 daily Lorazepam  0.5mg  daily prn panic attacks   Recent labs WNL  Monitor BP between visits while taking stimulant medication.   RTC 8 weeks  25 minutes spent dedicated to the care of this patient on the date of this encounter to include pre-visit review of records, ordering of medication, post visit documentation, and face-to-face time with the patient discussing PTSD, panic attacks, obsessional thoughts and acts, depression, ADD - combined type and bipolar disorder - 2. Discussed continuing  current medication regimen.  Patient advised to contact office with any questions, adverse effects, or acute worsening in signs and symptoms.  Discussed potential benefits, risk, and side effects of benzodiazepines to include potential risk of tolerance and dependence, as well as possible drowsiness.  Advised patient not to drive if experiencing drowsiness and to take lowest possible effective dose to minimize risk of dependence and tolerance.   Discussed potential metabolic side effects associated with atypical antipsychotics, as well as potential risk for movement side effects. Advised pt to contact office if movement side effects occur.   Diagnoses and all orders for this visit:  Attention deficit hyperactivity disorder (ADHD), combined type -     amphetamine -dextroamphetamine  (ADDERALL) 20 MG tablet; Take 1 tablet (20 mg total) by mouth 2 (two) times daily.  Panic attacks -     LORazepam  (ATIVAN ) 0.5 MG tablet; Take 1 tablet (0.5 mg total) by mouth 2 (two) times daily as needed. for anxiety  Generalized anxiety disorder -     ARIPiprazole   (ABILIFY ) 2 MG tablet; Take 1 tablet (2 mg total) by mouth 2 (two) times daily.  Insomnia, unspecified type -     QUEtiapine  (SEROQUEL ) 200 MG tablet; Take 1 tablet (200 mg total) by mouth at bedtime.     Please see After Visit Summary for patient specific instructions.  Future Appointments  Date Time Provider Department Center  01/29/2024  3:00 PM Heinz, Sara E, PA-C [facility]  02/13/2024  1:00 PM Lomax, Amy, NP [facility] None    No orders of the defined types were placed in this encounter.     -------------------------------

## 2024-01-02 NOTE — Telephone Encounter (Signed)
 Pre Op Call made to pt. Left VM reminding them of upcoming procedure appointment at [facility] with Dr. Catherene on 01/11/24 at 3:40PM.  Message included to arrive 15 minutes early, wear loose fitting clothing, to bring a driver, ok to eat a light meal and take medications as normal. If you develop any type of infection, are treated with antibiotics, or develop Covid or Flu symptoms: please contact our office. If taking blood thinners, please call us  to discuss.  Phone number to call [phone] ([phone] if any unforseen circumstances should arise.

## 2024-01-03 ENCOUNTER — Encounter (HOSPITAL_COMMUNITY): Payer: Self-pay | Admitting: Emergency Medicine

## 2024-01-03 ENCOUNTER — Other Ambulatory Visit: Payer: Self-pay

## 2024-01-03 ENCOUNTER — Emergency Department (HOSPITAL_COMMUNITY)
Admission: EM | Admit: 2024-01-03 | Discharge: 2024-01-03 | Disposition: A | Discharge status: Home / Self Care | Attending: Student | Admitting: Student

## 2024-01-03 DIAGNOSIS — E039 Hypothyroidism, unspecified: Secondary | ICD-10-CM | POA: Diagnosis not present

## 2024-01-03 DIAGNOSIS — D72829 Elevated white blood cell count, unspecified: Secondary | ICD-10-CM | POA: Insufficient documentation

## 2024-01-03 DIAGNOSIS — Z87891 Personal history of nicotine dependence: Secondary | ICD-10-CM | POA: Insufficient documentation

## 2024-01-03 DIAGNOSIS — Z79899 Other long term (current) drug therapy: Secondary | ICD-10-CM | POA: Diagnosis not present

## 2024-01-03 DIAGNOSIS — R1110 Vomiting, unspecified: Secondary | ICD-10-CM | POA: Diagnosis present

## 2024-01-03 DIAGNOSIS — R1115 Cyclical vomiting syndrome unrelated to migraine: Secondary | ICD-10-CM | POA: Insufficient documentation

## 2024-01-03 DIAGNOSIS — E119 Type 2 diabetes mellitus without complications: Secondary | ICD-10-CM | POA: Diagnosis not present

## 2024-01-03 DIAGNOSIS — I10 Essential (primary) hypertension: Secondary | ICD-10-CM | POA: Insufficient documentation

## 2024-01-03 LAB — CBC WITH DIFFERENTIAL/PLATELET
Abs Immature Granulocytes: 0.06 K/uL (ref 0.00–0.07)
Basophils Absolute: 0 K/uL (ref 0.0–0.1)
Basophils Relative: 0 %
Eosinophils Absolute: 0 K/uL (ref 0.0–0.5)
Eosinophils Relative: 0 %
HCT: 41.2 % (ref 36.0–46.0)
Hemoglobin: 14.2 g/dL (ref 12.0–15.0)
Immature Granulocytes: 1 %
Lymphocytes Relative: 12 %
Lymphs Abs: 1.5 K/uL (ref 0.7–4.0)
MCH: 30.5 pg (ref 26.0–34.0)
MCHC: 34.5 g/dL (ref 30.0–36.0)
MCV: 88.6 fL (ref 80.0–100.0)
Monocytes Absolute: 0.4 K/uL (ref 0.1–1.0)
Monocytes Relative: 3 %
Neutro Abs: 10.7 K/uL — ABNORMAL HIGH (ref 1.7–7.7)
Neutrophils Relative %: 84 %
Platelets: 450 K/uL — ABNORMAL HIGH (ref 150–400)
RBC: 4.65 MIL/uL (ref 3.87–5.11)
RDW: 14.6 % (ref 11.5–15.5)
WBC: 12.7 K/uL — ABNORMAL HIGH (ref 4.0–10.5)
nRBC: 0 % (ref 0.0–0.2)

## 2024-01-03 LAB — URINALYSIS, ROUTINE W REFLEX MICROSCOPIC
Bilirubin Urine: NEGATIVE
Glucose, UA: 50 mg/dL — AB
Ketones, ur: 20 mg/dL — AB
Leukocytes,Ua: NEGATIVE
Nitrite: NEGATIVE
Protein, ur: 100 mg/dL — AB
Specific Gravity, Urine: 1.028 (ref 1.005–1.030)
pH: 5 (ref 5.0–8.0)

## 2024-01-03 LAB — COMPREHENSIVE METABOLIC PANEL WITH GFR
ALT: 18 U/L (ref 0–44)
AST: 29 U/L (ref 15–41)
Albumin: 4.6 g/dL (ref 3.5–5.0)
Alkaline Phosphatase: 76 U/L (ref 38–126)
Anion gap: 17 — ABNORMAL HIGH (ref 5–15)
BUN: 15 mg/dL (ref 6–20)
CO2: 19 mmol/L — ABNORMAL LOW (ref 22–32)
Calcium: 9.8 mg/dL (ref 8.9–10.3)
Chloride: 103 mmol/L (ref 98–111)
Creatinine, Ser: 0.94 mg/dL (ref 0.44–1.00)
GFR, Estimated: >60 mL/min (ref >60)
Glucose, Bld: 231 mg/dL — ABNORMAL HIGH (ref 70–99)
Potassium: 3.9 mmol/L (ref 3.5–5.1)
Sodium: 139 mmol/L (ref 135–145)
Total Bilirubin: 0.5 mg/dL (ref 0.0–1.2)
Total Protein: 8.6 g/dL — ABNORMAL HIGH (ref 6.5–8.1)

## 2024-01-03 LAB — RAPID URINE DRUG SCREEN, HOSP PERFORMED
Amphetamines: POSITIVE — AB
Barbiturates: NOT DETECTED
Benzodiazepines: POSITIVE — AB
Cocaine: NOT DETECTED
Opiates: NOT DETECTED
Tetrahydrocannabinol: POSITIVE — AB

## 2024-01-03 LAB — LIPASE, BLOOD: Lipase: 21 U/L (ref 11–51)

## 2024-01-03 LAB — HCG, QUANTITATIVE, PREGNANCY: hCG, Beta Chain, Quant, S: <1 m[IU]/mL (ref <5)

## 2024-01-03 MED ORDER — ONDANSETRON HCL 4 MG/2ML IJ SOLN
4.0000 mg | Freq: Once | INTRAMUSCULAR | Status: AC
  Administered 2024-01-03: 4 mg via INTRAVENOUS
  Filled 2024-01-03: qty 2

## 2024-01-03 MED ORDER — PROCHLORPERAZINE EDISYLATE 10 MG/2ML IJ SOLN
10.0000 mg | Freq: Once | INTRAMUSCULAR | Status: AC
  Administered 2024-01-03: 10 mg via INTRAVENOUS
  Filled 2024-01-03: qty 2

## 2024-01-03 MED ORDER — LORAZEPAM 2 MG/ML IJ SOLN
1.0000 mg | Freq: Once | INTRAMUSCULAR | Status: AC
  Administered 2024-01-03: 1 mg via INTRAVENOUS
  Filled 2024-01-03: qty 1

## 2024-01-03 MED ORDER — LORAZEPAM 2 MG/ML IJ SOLN
0.5000 mg | Freq: Once | INTRAMUSCULAR | Status: AC
  Administered 2024-01-03: 0.5 mg via INTRAVENOUS
  Filled 2024-01-03: qty 1

## 2024-01-03 MED ORDER — LACTATED RINGERS IV BOLUS
1000.0000 mL | Freq: Once | INTRAVENOUS | Status: AC
  Administered 2024-01-03: 1000 mL via INTRAVENOUS

## 2024-01-03 MED ORDER — DROPERIDOL 2.5 MG/ML IJ SOLN
1.2500 mg | Freq: Once | INTRAMUSCULAR | Status: AC
  Administered 2024-01-03: 1.25 mg via INTRAVENOUS
  Filled 2024-01-03: qty 2

## 2024-01-03 NOTE — ED Triage Notes (Signed)
 Pt c/o of N/V/D x 3 days. Pt states she has Cyclic vomiting.

## 2024-01-03 NOTE — ED Provider Notes (Signed)
 [place] EMERGENCY DEPARTMENT AT [facility] Provider Note  CSN: [id number] Arrival date & time: 01/03/24 0725  Chief Complaint(s) Emesis  HPI Sonya Gibson is a 38 y.o. female with PMH cyclic vomiting syndrome with chronic abdominal pain, hypothyroidism, GERD, PTSD who presents emergency ferment for evaluation of abdominal pain nausea and vomiting.  Patient states that her husband is admitted to the hospital with Fournier's gangrene and this is causing a lot of stress.  She does endorse using delta 8 THC edible to try to manage her nausea but this has not worked.  She endorses persistent nausea and vomiting of the last 3 days and states that this feels very similar to her previous episode of cyclic vomiting.  Denies headache, fever, diarrhea or other systemic symptoms.   Past Medical History Past Medical History:  Diagnosis Date   Anxiety    Chronic back pain    Depression    Diabetes (HCC)    Drug-seeking behavior    Essential hypertension    GERD (gastroesophageal reflux disease)    Hypothyroidism    Kidney stone    Lumbar radiculopathy, right    Migraine headache    PTSD (post-traumatic stress disorder)    Patient Active Problem List   Diagnosis Date Noted   Chronic migraine without aura without status migrainosus, not intractable 03/21/2022   Migraine without aura and without status migrainosus, not intractable 05/12/2021   Morbid obesity with BMI of 60.0-69.9, adult (HCC) 07/10/2018   Headache due to low cerebrospinal fluid pressure 07/09/2018   Postoperative CSF leak 07/09/2018   Convulsion, 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 Authorizing Provider  acetaminophen  (TYLENOL ) 325 MG tablet Take 650 mg by mouth every 6 (six) hours as needed.    [provider]  albuterol  (VENTOLIN  HFA) 108 (90 Base) MCG/ACT inhaler Inhale 2 puffs into the lungs every 6 (six) hours as needed for wheezing or shortness of breath.    [provider]  amphetamine -dextroamphetamine  (ADDERALL) 20 MG tablet Take 1 tablet (20 mg total) by mouth 2 (two) times daily. 12/26/23   Mozingo, Regina Nattalie, NP  ARIPiprazole  (ABILIFY ) 2 MG tablet Take 1 tablet (2 mg total) by mouth 2 (two) times daily. 12/26/23   Mozingo, Regina Nattalie, NP  botulinum toxin Type A  (BOTOX ) 200 units injection Provider to inject 155 units into the muscles of the head and neck every 12 weeks. Discard remainder. 10/03/22   Ines Onetha NOVAK, MD  calcium  carbonate (TUMS EX) 750 MG chewable tablet Chew 2 tablets by mouth as needed for heartburn.    [provider]  celecoxib  (CELEBREX ) 200 MG capsule Take 200 mg by mouth daily.    [provider]  CHERRY PO Take 1 tablet by mouth daily.    [provider]  DULoxetine  (CYMBALTA ) 60 MG capsule Take 2 capsules (120 mg total) by mouth daily. 09/12/23  Mozingo, Regina Nattalie, NP  fluconazole  (DIFLUCAN ) 150 MG tablet Take 150 mg by mouth once. 08/10/23   [provider]  gabapentin  (NEURONTIN ) 600 MG tablet Take 1,200 mg by mouth 3 (three) times daily.    [provider]  linaclotide  (LINZESS ) 145 MCG CAPS capsule Take 1 capsule (145 mcg total) by mouth daily before breakfast. 12/01/23   Arletta, Sara E, PA-C  LORazepam  (ATIVAN ) 0.5 MG tablet Take 1 tablet (0.5 mg total) by mouth 2 (two) times daily as needed. for anxiety  12/26/23   Mozingo, Regina Nattalie, NP  morphine  (MSIR) 15 MG tablet Take 0.5 tablets (7.5 mg total) by mouth every 6 (six) hours as needed for severe pain (pain score 7-10). 10/28/23   Idol, Julie, PA-C  MOUNJARO 10 MG/0.5ML Pen SMARTSIG:10 Milligram(s) SUB-Q Once a Week    [provider]  naloxone  (NARCAN ) nasal spray 4 mg/0.1 mL Place into the nose. 09/12/21   [provider]  ondansetron  (ZOFRAN -ODT) 4 MG disintegrating tablet DISSOLVE ONE TABLET UNDER THE TONGUE EVERY 8 HOURS AS NEEDED FOR NAUSEA 12/28/22   Ines Onetha NOVAK, MD  oxyCODONE  (OXYCONTIN ) 10 mg 12 hr tablet Take 10 mg by mouth every 12 (twelve) hours.    [provider]  promethazine  (PHENERGAN ) 25 MG suppository Place 1 suppository (25 mg total) rectally every 6 (six) hours as needed for nausea or vomiting. 10/27/23   Garrick Charleston, MD  promethazine  (PHENERGAN ) 25 MG tablet Take 1 tablet (25 mg total) by mouth every 6 (six) hours as needed for nausea or vomiting. 01/10/23 12/01/23  Waddell Sluder, PA-C  QUEtiapine  (SEROQUEL ) 100 MG tablet Take 1 tablet (100 mg total) by mouth at bedtime. 08/08/23   Mozingo, Regina Nattalie, NP  QUEtiapine  (SEROQUEL ) 200 MG tablet Take 1 tablet (200 mg total) by mouth at bedtime. 12/26/23   Mozingo, Regina Nattalie, NP  Rectal Protectant-Emollient (CALMOL-4) 76-10 % SUPP Use as needed 12/01/23   Arletta, Sara E, PA-C  Rimegepant Sulfate (NURTEC) 75 MG TBDP Take 1 tablet (75 mg total) by mouth daily as needed. For migraines. Take as close to onset of migraine as possible. One daily maximum. 11/01/23   Lomax, Amy, NP  rizatriptan  (MAXALT -MLT) 10 MG disintegrating tablet Take 1 tablet (10 mg total) by mouth as needed for migraine. May repeat in 2 hours if needed 05/12/21   Ines Onetha NOVAK, MD  tiZANidine  (ZANAFLEX ) 4 MG tablet Take 1.5 tablets (6 mg total) by mouth every 8 (eight) hours. 04/13/20   [provider]  traMADol (ULTRAM-ER) 300 MG 24 hr tablet Take 300 mg by mouth  daily.    [provider]  traZODone  (DESYREL ) 150 MG tablet Take 1 tablet (150 mg total) by mouth at bedtime. 09/12/23   Mozingo, Regina Nattalie, NP  Past Surgical History Past Surgical History:  Procedure Laterality Date   ANTERIOR CRUCIATE LIGAMENT REPAIR Left    BACK SURGERY  12/05/2017   CESAREAN SECTION     IR EPIDUROGRAPHY  11/17/2017   KNEE SURGERY     LUMBAR LAMINECTOMY/DECOMPRESSION MICRODISCECTOMY Right 12/05/2017   Procedure: Right L4-5 Microdiskectomy;  Surgeon: Joshua Alm RAMAN, MD;  Location: [facility] OR;  Service: Neurosurgery;  Laterality: Right;   LUMBAR LAMINECTOMY/DECOMPRESSION MICRODISCECTOMY N/A 12/20/2017   Procedure: Repair of CSF Leak;  Surgeon: Joshua Alm RAMAN, MD;  Location: [facility] OR;  Service: Neurosurgery;  Laterality: N/A;   LUMBAR WOUND DEBRIDEMENT N/A 12/27/2017   Procedure: Re-exploration for CSF leak repair and placement of lumbar drain;  Surgeon: Joshua Alm RAMAN, MD;  Location: [facility] OR;  Service: Neurosurgery;  Laterality: N/A;   middle finger reatachment     PLACEMENT OF LUMBAR DRAIN N/A 12/27/2017   Procedure: PLACEMENT OF LUMBAR DRAIN;  Surgeon: Joshua Alm RAMAN, MD;  Location: [facility] OR;  Service: Neurosurgery;  Laterality: N/A;   SPINAL CORD STIMULATOR IMPLANT  03/31/2023   TONSILLECTOMY AND ADENOIDECTOMY  1996   VENTRICULOPERITONEAL SHUNT N/A 01/08/2018   Procedure: Re-exploration and repair of previous Lumbar cerebro-spinal fluid leak and placement of Lumbar drain;  Surgeon: Joshua Alm RAMAN, MD;  Location: [facility] OR;  Service: Neurosurgery;  Laterality: N/A;   Family History Family History  Problem Relation Age of Onset   Diabetes Mellitus II Mother    Hypertension Mother    Seizures Mother    Other Mother        post stroke headaches   Stroke Mother        x 80   Pancreatic cancer Mother    Hypertension Father    Diabetes  Mellitus II Father    Hyperlipidemia Father    Heart disease Father    Hearing loss Father        was a VET   Bipolar disorder Sister    Autism Daughter        level 3   Other Daughter        tibia torsion & femoral aniversion   Asthma Son    Autism Son    Tourette syndrome Son     Social History Social History   Tobacco Use   Smoking status: Former    Current packs/day: 0.00    Average packs/day: 2.0 packs/day for 3.0 years (6.0 ttl pk-yrs)    Types: Cigarettes    Start date: 2003    Quit date: 2006    Years since quitting: 19.5   Smokeless tobacco: Never  Vaping Use   Vaping status: Never Used  Substance Use Topics   Alcohol use: Yes    Comment: socially   Drug use: No   Allergies Cinnamon, Clindamycin, Coconut (cocos nucifera), Lovenox  [enoxaparin ], Nystatin, Aspartame, Niacin, Nitrofurantoin, Other, and Pregabalin  Review of Systems Review of Systems  Gastrointestinal:  Positive for abdominal pain, nausea and vomiting.    Physical Exam Vital Signs  I have reviewed the triage vital signs BP 107/82   Pulse (!) 107   Temp 97.8 F (36.6 C) (Oral)   Resp 18   Ht 5' 1 (1.549 m)   Wt 118 kg   SpO2 (!) 88%   BMI 49.15 kg/m   Physical Exam Vitals and nursing note reviewed.  Constitutional:      General: She is not in acute distress.    Appearance: She is well-developed.  HENT:     Head:  Normocephalic and atraumatic.  Eyes:     Conjunctiva/sclera: Conjunctivae normal.  Cardiovascular:     Rate and Rhythm: Normal rate and regular rhythm.     Heart sounds: No murmur heard. Pulmonary:     Effort: Pulmonary effort is normal. No respiratory distress.     Breath sounds: Normal breath sounds.  Abdominal:     Palpations: Abdomen is soft.  Musculoskeletal:        General: No swelling.     Cervical back: Neck supple.  Skin:    General: Skin is warm and dry.     Capillary Refill: Capillary refill takes less than 2 seconds.  Neurological:     Mental  Status: She is alert.  Psychiatric:        Mood and Affect: Mood normal.     ED Results and Treatments Labs (all labs ordered are listed, but only abnormal results are displayed) Labs Reviewed  CBC WITH DIFFERENTIAL/PLATELET - Abnormal; Notable for the following components:      Result Value   WBC 12.7 (*)    Platelets 450 (*)    Neutro Abs 10.7 (*)    All other components within normal limits  COMPREHENSIVE METABOLIC PANEL WITH GFR - Abnormal; Notable for the following components:   CO2 19 (*)    Glucose, Bld 231 (*)    Total Protein 8.6 (*)    Anion gap 17 (*)    All other components within normal limits  URINALYSIS, ROUTINE W REFLEX MICROSCOPIC - Abnormal; Notable for the following components:   APPearance CLOUDY (*)    Glucose, UA 50 (*)    Hgb urine dipstick MODERATE (*)    Ketones, ur 20 (*)    Protein, ur 100 (*)    Bacteria, UA RARE (*)    All other components within normal limits  RAPID URINE DRUG SCREEN, HOSP PERFORMED - Abnormal; Notable for the following components:   Benzodiazepines POSITIVE (*)    Amphetamines POSITIVE (*)    Tetrahydrocannabinol POSITIVE (*)    All other components within normal limits  LIPASE, BLOOD  HCG, QUANTITATIVE, PREGNANCY                                                                                                                          Radiology No results found.  Pertinent labs & imaging results that were available during my care of the patient were reviewed by me and considered in my medical decision making (see MDM for details).  Medications Ordered in ED Medications  droperidol  (INAPSINE ) 2.5 MG/ML injection 1.25 mg (1.25 mg Intravenous Given 01/03/24 0808)  LORazepam  (ATIVAN ) injection 0.5 mg (0.5 mg Intravenous Given 01/03/24 0807)  lactated ringers  bolus 1,000 mL (0 mLs Intravenous Stopped 01/03/24 1103)  ondansetron  (ZOFRAN ) injection 4 mg (4 mg Intravenous Given 01/03/24 0849)  LORazepam  (ATIVAN ) injection 1 mg (1 mg  Intravenous Given 01/03/24 0849)  prochlorperazine  (COMPAZINE ) injection 10 mg (10 mg Intravenous Given 01/03/24 1043)  Procedures Procedures  (including critical care time)  Medical Decision Making / ED Course   This patient presents to the ED for concern of abdominal pain nausea vomiting, this involves an extensive number of treatment options, and is a complaint that carries with it a high risk of complications and morbidity.  The differential diagnosis includes cyclic vomiting, cannabinoid hyperemesis, dehydration, electrolyte abnormality, pancreatitis, gastritis  MDM: Patient seen emerged from for evaluation of abdominal pain nausea and vomiting.  Physical exam with a soft benign nontender abdomen but patient is actively vomiting.  Laboratory evaluation with a leukocytosis of 12.7, CO2 19, urinalysis with some mild ketonuria but is otherwise unremarkable.  Pregnancy is negative.  UDS positive for benzos, amphetamines and THC.  Patient is however prescribed both amphetamine  based ADHD medication and benzodiazepine anxiety medications.  Patient given droperidol  and Ativan  and symptoms starting to improve but she still feels mildly persistently nauseous.  Received an additional dose of Ativan  and Compazine  and on reevaluation symptoms have improved she is able to tolerate p.o. without difficulty.  She states that this is the exact presentation of her cyclic vomiting syndrome and given normal abdominal exam we will defer CT imaging at this time.  At this time she does not meet inpatient criteria for admission will be discharged outpatient follow-up.  Return precautions given which she voiced understanding.  Of note, vital signs states that patient was hypoxic at 88%.  I personally checked this at bedside and she is satting 97% on room air at time of  discharge.   Additional history obtained:  -External records from outside source obtained and reviewed including: Chart review including previous notes, labs, imaging, consultation notes   Lab Tests: -I ordered, reviewed, and interpreted labs.   The pertinent results include:   Labs Reviewed  CBC WITH DIFFERENTIAL/PLATELET - Abnormal; Notable for the following components:      Result Value   WBC 12.7 (*)    Platelets 450 (*)    Neutro Abs 10.7 (*)    All other components within normal limits  COMPREHENSIVE METABOLIC PANEL WITH GFR - Abnormal; Notable for the following components:   CO2 19 (*)    Glucose, Bld 231 (*)    Total Protein 8.6 (*)    Anion gap 17 (*)    All other components within normal limits  URINALYSIS, ROUTINE W REFLEX MICROSCOPIC - Abnormal; Notable for the following components:   APPearance CLOUDY (*)    Glucose, UA 50 (*)    Hgb urine dipstick MODERATE (*)    Ketones, ur 20 (*)    Protein, ur 100 (*)    Bacteria, UA RARE (*)    All other components within normal limits  RAPID URINE DRUG SCREEN, HOSP PERFORMED - Abnormal; Notable for the following components:   Benzodiazepines POSITIVE (*)    Amphetamines POSITIVE (*)    Tetrahydrocannabinol POSITIVE (*)    All other components within normal limits  LIPASE, BLOOD  HCG, QUANTITATIVE, PREGNANCY     Medicines ordered and prescription drug management: Meds ordered this encounter  Medications   droperidol  (INAPSINE ) 2.5 MG/ML injection 1.25 mg   LORazepam  (ATIVAN ) injection 0.5 mg   lactated ringers  bolus 1,000 mL   ondansetron  (ZOFRAN ) injection 4 mg   LORazepam  (ATIVAN ) injection 1 mg   prochlorperazine  (COMPAZINE ) injection 10 mg    -I have reviewed the patients home medicines and have made adjustments as needed  Critical interventions none   Cardiac Monitoring: The patient was maintained on  a cardiac monitor.  I personally viewed and interpreted the cardiac monitored which showed an  underlying rhythm of: NSR  Social Determinants of Health:  Factors impacting patients care include: Active THC use, encouraged cessation   Reevaluation: After the interventions noted above, I reevaluated the patient and found that they have :improved  Co morbidities that complicate the patient evaluation  Past Medical History:  Diagnosis Date   Anxiety    Chronic back pain    Depression    Diabetes (HCC)    Drug-seeking behavior    Essential hypertension    GERD (gastroesophageal reflux disease)    Hypothyroidism    Kidney stone    Lumbar radiculopathy, right    Migraine headache    PTSD (post-traumatic stress disorder)       Dispostion: I considered admission for this patient, but at this time she does not meet inpatient right ear for admission and will be discharged outpatient follow-up.     Final Clinical Impression(s) / ED Diagnoses Final diagnoses:  Cyclical vomiting syndrome not associated with migraine     @PCDICTATION @    Albertina Dixon, MD 01/03/24 [phone]

## 2024-01-03 NOTE — ED Notes (Signed)
Pt aware we need urine sample.  

## 2024-01-29 ENCOUNTER — Ambulatory Visit: Admitting: Gastroenterology

## 2024-01-30 ENCOUNTER — Other Ambulatory Visit: Payer: Self-pay | Admitting: Adult Health

## 2024-01-30 DIAGNOSIS — F902 Attention-deficit hyperactivity disorder, combined type: Secondary | ICD-10-CM

## 2024-01-31 ENCOUNTER — Encounter: Payer: Self-pay | Admitting: Adult Health

## 2024-01-31 ENCOUNTER — Telehealth (INDEPENDENT_AMBULATORY_CARE_PROVIDER_SITE_OTHER): Admitting: Adult Health

## 2024-01-31 DIAGNOSIS — F902 Attention-deficit hyperactivity disorder, combined type: Secondary | ICD-10-CM

## 2024-01-31 MED ORDER — AMPHETAMINE-DEXTROAMPHETAMINE 20 MG PO TABS: 20.0000 mg | ORAL_TABLET | Freq: Two times a day (BID) | ORAL | 0 refills | Status: DC

## 2024-01-31 NOTE — Progress Notes (Signed)
 Sonya Gibson [id number] 05/14/1986 38 y.o.  Virtual Visit via Video Note  I connected with pt @ on 01/31/24 at  2:00 PM EDT by a video enabled telemedicine application and verified that I am speaking with the correct person using two identifiers.   I discussed the limitations of evaluation and management by telemedicine and the availability of in person appointments. The patient expressed understanding and agreed to proceed.  I discussed the assessment and treatment plan with the patient. The patient was provided an opportunity to ask questions and all were answered. The patient agreed with the plan and demonstrated an understanding of the instructions.   The patient was advised to call back or seek an in-person evaluation if the symptoms worsen or if the condition fails to improve as anticipated.  I provided 25 minutes of non-face-to-face time during this encounter.  The patient was located at home.  The provider was located at [facility].   Sonya LOISE Sayers, NP   Subjective:   Patient ID:  Sonya Gibson is a 38 y.o. (DOB 14-Mar-1986) female.  Chief Complaint: No chief complaint on file.   HPI Sonya Gibson presents for follow-up of PTSD, panic attacks, obsessional thoughts and acts, depression, ADD - combined type and bipolar disorder - 2.  Describes mood today as so-so. Pleasant. Reports tearfulness. Mood symptoms - reports depression, anxiety and irritability. Reports lower interest and motivation. Reports panic attacks. Reports flash backs. Reports nightmares. Reports outbursts. Reports worry, rumination and over thinking. Reports obsessive thoughts and acts. Reports increased situational stressors with husband's recent health issues. Reports mood as lower. Stating I feel like I'm managing. Reports current medication regimen is helpful for mood symptoms. Taking medications as prescribed. Appetite adequate. Weight gain - 265 to 270 pounds - [place] - A1C  improved. Reports sleeping better some nights than others. Averages 5 to 7 hours. Reports some daytime napping. Reports focus and concentration improved. Taking Adderall daily. Diagnosed with ADD in childhood. Completing tasks. Managing aspects of household. Reports being approved for LTD through her employer. Plans to continue to pursue SSD benefits - attorney working with her Denies SI or HI.  Denies AH. Denies VH. Denies self harm. Denies substance use.   Previous medication trials:  Buspar, Hydroxyzine.   Review of Systems:  Review of Systems  Musculoskeletal:  Negative for gait problem.  Neurological:  Negative for tremors.  Psychiatric/Behavioral:         Please refer to HPI    Medications: I have reviewed the patient's current medications.  Current Outpatient Medications  Medication Sig Dispense Refill   [START ON 02/28/2024] amphetamine -dextroamphetamine  (ADDERALL) 20 MG tablet Take 1 tablet (20 mg total) by mouth 2 (two) times daily. 60 tablet 0   [START ON 03/27/2024] amphetamine -dextroamphetamine  (ADDERALL) 20 MG tablet Take 1 tablet (20 mg total) by mouth 2 (two) times daily. 60 tablet 0   acetaminophen  (TYLENOL ) 325 MG tablet Take 650 mg by mouth every 6 (six) hours as needed.     albuterol  (VENTOLIN  HFA) 108 (90 Base) MCG/ACT inhaler Inhale 2 puffs into the lungs every 6 (six) hours as needed for wheezing or shortness of breath.     amphetamine -dextroamphetamine  (ADDERALL) 20 MG tablet Take 1 tablet (20 mg total) by mouth 2 (two) times daily. 60 tablet 0   ARIPiprazole  (ABILIFY ) 2 MG tablet Take 1 tablet (2 mg total) by mouth 2 (two) times daily. 60 tablet 2   botulinum toxin Type A  (BOTOX ) 200 units injection Provider  to inject 155 units into the muscles of the head and neck every 12 weeks. Discard remainder. 1 each 3   calcium  carbonate (TUMS EX) 750 MG chewable tablet Chew 2 tablets by mouth as needed for heartburn.     celecoxib  (CELEBREX ) 200 MG capsule Take 200 mg by  mouth daily.     CHERRY PO Take 1 tablet by mouth daily.     DULoxetine  (CYMBALTA ) 60 MG capsule Take 2 capsules (120 mg total) by mouth daily. 60 capsule 5   fluconazole  (DIFLUCAN ) 150 MG tablet Take 150 mg by mouth once.     gabapentin  (NEURONTIN ) 600 MG tablet Take 1,200 mg by mouth 3 (three) times daily.     linaclotide  (LINZESS ) 145 MCG CAPS capsule Take 1 capsule (145 mcg total) by mouth daily before breakfast. 30 capsule 3   LORazepam  (ATIVAN ) 0.5 MG tablet Take 1 tablet (0.5 mg total) by mouth 2 (two) times daily as needed. for anxiety 60 tablet 2   morphine  (MSIR) 15 MG tablet Take 0.5 tablets (7.5 mg total) by mouth every 6 (six) hours as needed for severe pain (pain score 7-10). 10 tablet 0   MOUNJARO 10 MG/0.5ML Pen SMARTSIG:10 Milligram(s) SUB-Q Once a Week     naloxone  (NARCAN ) nasal spray 4 mg/0.1 mL Place into the nose.     ondansetron  (ZOFRAN -ODT) 4 MG disintegrating tablet DISSOLVE ONE TABLET UNDER THE TONGUE EVERY 8 HOURS AS NEEDED FOR NAUSEA 30 tablet 0   oxyCODONE  (OXYCONTIN ) 10 mg 12 hr tablet Take 10 mg by mouth every 12 (twelve) hours.     promethazine  (PHENERGAN ) 25 MG suppository Place 1 suppository (25 mg total) rectally every 6 (six) hours as needed for nausea or vomiting. 12 each 0   promethazine  (PHENERGAN ) 25 MG tablet Take 1 tablet (25 mg total) by mouth every 6 (six) hours as needed for nausea or vomiting. 120 tablet 0   QUEtiapine  (SEROQUEL ) 100 MG tablet Take 1 tablet (100 mg total) by mouth at bedtime. 30 tablet 5   QUEtiapine  (SEROQUEL ) 200 MG tablet Take 1 tablet (200 mg total) by mouth at bedtime. 30 tablet 2   Rectal Protectant-Emollient (CALMOL-4) 76-10 % SUPP Use as needed     Rimegepant Sulfate (NURTEC) 75 MG TBDP Take 1 tablet (75 mg total) by mouth daily as needed. For migraines. Take as close to onset of migraine as possible. One daily maximum. 10 tablet 6   rizatriptan  (MAXALT -MLT) 10 MG disintegrating tablet Take 1 tablet (10 mg total) by mouth as  needed for migraine. May repeat in 2 hours if needed 9 tablet 11   tiZANidine  (ZANAFLEX ) 4 MG tablet Take 1.5 tablets (6 mg total) by mouth every 8 (eight) hours.     traMADol (ULTRAM-ER) 300 MG 24 hr tablet Take 300 mg by mouth daily.     traZODone  (DESYREL ) 150 MG tablet Take 1 tablet (150 mg total) by mouth at bedtime. 30 tablet 5   Current Facility-Administered Medications  Medication Dose Route Frequency Provider Last Rate Last Admin   botulinum toxin Type A  (BOTOX ) injection 155 Units  155 Units Intramuscular Once Lomax, Amy, NP        Medication Side Effects: None  Allergies:  Allergies  Allergen Reactions   Cinnamon Anaphylaxis    (NO REAL/TREE BARK CINNAMON)   Clindamycin Nausea And Vomiting    Other reaction(s): gi distress Chest pain, trouble swallowing Chest pain, trouble swallowing    Coconut (Cocos Nucifera) Anaphylaxis    Can eat  fake coconut   Lovenox  [Enoxaparin ] Other (See Comments)    BROKE DOWN THE SKIN AT INJECTION SITE AND CAUSED A WOUND   Nystatin Anaphylaxis   Aspartame     Other reaction(s): Other (See Comments) Breathing Difficulty, Serious intense migraines.  Breathing Difficulty, Serious intense migraines.     Niacin Hives   Nitrofurantoin Hives   Other     Headache cocktail   Blister to abd   Pregabalin     Other reaction(s): Other (See Comments) Depression with suicidal thoughts Depression with suicidal thoughts     Past Medical History:  Diagnosis Date   Anxiety    Chronic back pain    Depression    Diabetes (HCC)    Drug-seeking behavior    Essential hypertension    GERD (gastroesophageal reflux disease)    Hypothyroidism    Kidney stone    Lumbar radiculopathy, right    Migraine headache    PTSD (post-traumatic stress disorder)     Family History  Problem Relation Age of Onset   Diabetes Mellitus II Mother    Hypertension Mother    Seizures Mother    Other Mother        post stroke headaches   Stroke Mother         x 33   Pancreatic cancer Mother    Hypertension Father    Diabetes Mellitus II Father    Hyperlipidemia Father    Heart disease Father    Hearing loss Father        was a VET   Bipolar disorder Sister    Autism Daughter        level 3   Other Daughter        tibia torsion & femoral aniversion   Asthma Son    Autism Son    Tourette syndrome Son     Social History   Socioeconomic History   Marital status: Married    Spouse name: Brad   Number of children: 3   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    Highest education level: Not on file  Occupational History   Not on file  Tobacco Use   Smoking status: Former    Current packs/day: 0.00    Average packs/day: 2.0 packs/day for 3.0 years (6.0 ttl pk-yrs)    Types: Cigarettes    Start date: 2003    Quit date: 2006    Years since quitting: 19.6   Smokeless tobacco: Never  Vaping Use   Vaping status: Never Used  Substance and Sexual Activity   Alcohol use: Yes    Comment: socially   Drug use: No   Sexual activity: Yes    Birth control/protection: None  Other Topics Concern   Not on file  Social History Narrative   Lives at home with husband and kids   Right handed   Caffeine : 6 cups daily   Social Drivers of Health   Financial Resource Strain: Low Risk  (01/03/2022)   Received from [facility]   Overall Financial Resource Strain (CARDIA)    Difficulty of Paying Living Expenses: Not very hard  Food Insecurity: Low Risk  (11/29/2023)   Received from [facility]   Hunger Vital Sign    Within the past 12 months, you worried that your food would run out before you got money to buy more: Never true    Within the past 12 months, the food you bought just didn't last and you didn't have money  to get more. : Never true  Transportation Needs: No Transportation Needs (11/29/2023)   Received from [facility]    In the past 12 months, has lack of reliable transportation kept you from medical  appointments, meetings, work or from getting things needed for daily living? : No  Physical Activity: Not on file  Stress: Not on file  Social Connections: Not on file  Intimate Partner Violence: Not on file    Past Medical History, Surgical history, Social history, and Family history were reviewed and updated as appropriate.   Please see review of systems for further details on the patient's review from today.   Objective:   Physical Exam:  There were no vitals taken for this visit.  Physical Exam Constitutional:      General: She is not in acute distress. Musculoskeletal:        General: No deformity.  Neurological:     Mental Status: She is alert and oriented to person, place, and time.     Coordination: Coordination normal.  Psychiatric:        Attention and Perception: Attention and perception normal. She does not perceive auditory or visual hallucinations.        Mood and Affect: Mood normal. Mood is not anxious or depressed. Affect is not labile, blunt, angry or inappropriate.        Speech: Speech normal.        Behavior: Behavior normal.        Thought Content: Thought content normal. Thought content is not paranoid or delusional. Thought content does not include homicidal or suicidal ideation. Thought content does not include homicidal or suicidal plan.        Cognition and Memory: Cognition and memory normal.        Judgment: Judgment normal.     Comments: Insight intact     Lab Review:     Component Value Date/Time   NA 139 01/03/2024 0803   K 3.9 01/03/2024 0803   CL 103 01/03/2024 0803   CO2 19 (L) 01/03/2024 0803   GLUCOSE 231 (H) 01/03/2024 0803   BUN 15 01/03/2024 0803   CREATININE 0.94 01/03/2024 0803   CALCIUM  9.8 01/03/2024 0803   PROT 8.6 (H) 01/03/2024 0803   ALBUMIN 4.6 01/03/2024 0803   AST 29 01/03/2024 0803   ALT 18 01/03/2024 0803   ALKPHOS 76 01/03/2024 0803   BILITOT 0.5 01/03/2024 0803   GFRNONAA >60 01/03/2024 0803   GFRAA >60  02/21/2020 1033       Component Value Date/Time   WBC 12.7 (H) 01/03/2024 0803   RBC 4.65 01/03/2024 0803   HGB 14.2 01/03/2024 0803   HCT 41.2 01/03/2024 0803   PLT 450 (H) 01/03/2024 0803   MCV 88.6 01/03/2024 0803   MCH 30.5 01/03/2024 0803   MCHC 34.5 01/03/2024 0803   RDW 14.6 01/03/2024 0803   LYMPHSABS 1.5 01/03/2024 0803   MONOABS 0.4 01/03/2024 0803   EOSABS 0.0 01/03/2024 0803   BASOSABS 0.0 01/03/2024 0803    No results found for: POCLITH, LITHIUM   No results found for: PHENYTOIN, PHENOBARB, VALPROATE, CBMZ   .res Assessment: Plan:    Plan:  PDMP reviewed  Adderall 20mg  BID  Abilify  2mg  twice daily for mood symptoms  Seroquel  100mg  at hs Trazadone 150mg  at bedtime - typically 75mg  at hs Cymbalta  60mg  - 2 daily Lorazepam  0.5mg  daily prn panic attacks   Recent labs WNL  Monitor BP between visits while taking  stimulant medication.   RTC 8 weeks  25 minutes spent dedicated to the care of this patient on the date of this encounter to include pre-visit review of records, ordering of medication, post visit documentation, and face-to-face time with the patient discussing PTSD, panic attacks, obsessional thoughts and acts, depression, ADD - combined type and bipolar disorder - 2. Discussed continuing current medication regimen.  Patient advised to contact office with any questions, adverse effects, or acute worsening in signs and symptoms.  Discussed potential benefits, risk, and side effects of benzodiazepines to include potential risk of tolerance and dependence, as well as possible drowsiness.  Advised patient not to drive if experiencing drowsiness and to take lowest possible effective dose to minimize risk of dependence and tolerance.   Discussed potential metabolic side effects associated with atypical antipsychotics, as well as potential risk for movement side effects. Advised pt to contact office if movement side effects occur.    Diagnoses  and all orders for this visit:  Attention deficit hyperactivity disorder (ADHD), combined type -     amphetamine -dextroamphetamine  (ADDERALL) 20 MG tablet; Take 1 tablet (20 mg total) by mouth 2 (two) times daily. -     amphetamine -dextroamphetamine  (ADDERALL) 20 MG tablet; Take 1 tablet (20 mg total) by mouth 2 (two) times daily. -     amphetamine -dextroamphetamine  (ADDERALL) 20 MG tablet; Take 1 tablet (20 mg total) by mouth 2 (two) times daily.     Please see After Visit Summary for patient specific instructions.  Future Appointments  Date Time Provider Department Center  02/13/2024  1:00 PM Cary No, NP [facility] None  03/01/2024  8:00 AM [facility] [place] [facility]  03/13/2024  1:45 PM Heinz, Camie BRAVO, PA-C [facility]    No orders of the defined types were placed in this encounter.     -------------------------------

## 2024-02-02 ENCOUNTER — Other Ambulatory Visit: Payer: Self-pay | Admitting: Adult Health

## 2024-02-02 DIAGNOSIS — F411 Generalized anxiety disorder: Secondary | ICD-10-CM

## 2024-02-02 DIAGNOSIS — F422 Mixed obsessional thoughts and acts: Secondary | ICD-10-CM

## 2024-02-02 DIAGNOSIS — F4310 Post-traumatic stress disorder, unspecified: Secondary | ICD-10-CM

## 2024-02-05 ENCOUNTER — Other Ambulatory Visit: Payer: Self-pay

## 2024-02-05 ENCOUNTER — Ambulatory Visit: Admitting: Family Medicine

## 2024-02-05 ENCOUNTER — Other Ambulatory Visit: Payer: Self-pay | Admitting: Neurology

## 2024-02-05 MED ORDER — BOTOX 200 UNITS IJ SOLR
INTRAMUSCULAR | 2 refills | Status: AC
  Filled 2024-02-05: qty 1, 84d supply, fill #0
  Filled 2024-07-10: qty 1, 84d supply, fill #1

## 2024-02-05 NOTE — Progress Notes (Signed)
 Specialty Pharmacy Refill Coordination Note  Sonya Gibson is a 38 y.o. female assessed today regarding refills of clinic administered specialty medication(s) OnabotulinumtoxinA  (BOTOX )   Clinic requested Courier to Provider Office   Delivery date: 02/08/24   Verified address: [facility] [address]   Medication will be filled on 08.20.25.     Next injection appointment 8.26.25. Sent refill request for Botox .

## 2024-02-05 NOTE — Telephone Encounter (Signed)
 Please send Botox refills to [facility], thank you!

## 2024-02-06 ENCOUNTER — Other Ambulatory Visit: Payer: Self-pay

## 2024-02-12 NOTE — Progress Notes (Deleted)
 02/12/24 ALL: Sonya Gibson returns for Botox . MASSETER  11/01/2023 ALL: Sonya Gibson returns for Botox . Last procedure 04/2023. She reports significant worsening in migraines since. With Botox  on board she averages 2-3 migraines per month. Off Botox  she has 2-3 per week. She was recently diagnosed with cyclic vomiting. Event proceeded by migraine. IV fluids required for three days. She continues Nurtec and rizatriptan  as needed.   05/04/2023 ALL: She returns for Botox . She reports headaches have been a little worse over the past 12 weeks. She had nerve stimulator placed and is having some pain. Nurtec does help. Masseter injections are helpful. She will journal headaches over next 12 weeks.   02/07/2023 ALL: She has been followed by Dr Ines for Botox  therapy. 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 Nurtec works well for abortive therapy. She requests masseter injections. She has significant history of clenching requiring molar implants. She is a paramedic.     Consent Form Botulism Toxin Injection For Chronic Migraine    Reviewed orally with patient, additionally signature is on file:  Botulism toxin has been approved by the Federal drug administration for treatment of chronic migraine. Botulism toxin does not cure chronic migraine and it may not be effective in some patients.  The administration of botulism toxin is accomplished by injecting a small amount of toxin into the muscles of the neck and head. Dosage must be titrated for each individual. Any benefits resulting from botulism toxin tend to wear off after 3 months with a repeat injection required if benefit is to be maintained. Injections are usually done every 3-4 months with maximum effect peak achieved by about 2 or 3 weeks. Botulism toxin is expensive and you should be sure of what costs you will incur resulting from the injection.  The side effects of botulism toxin use for chronic migraine may include:   -Transient, and  usually mild, facial weakness with facial injections  -Transient, and usually mild, head or neck weakness with head/neck injections  -Reduction or loss of forehead facial animation due to forehead muscle weakness  -Eyelid drooping  -Dry eye  -Pain at the site of injection or bruising at the site of injection  -Double vision  -Potential unknown long term risks   Contraindications: You should not have Botox  if you are pregnant, nursing, allergic to albumin, have an infection, skin condition, or muscle weakness at the site of the injection, or have myasthenia gravis, Lambert-Eaton syndrome, or ALS.  It is also possible that as with any injection, there may be an allergic reaction or no effect from the medication. Reduced effectiveness after repeated injections is sometimes seen and rarely infection at the injection site may occur. All care will be taken to prevent these side effects. If therapy is given over a long time, atrophy and wasting in the muscle injected may occur. Occasionally the patient's become refractory to treatment because they develop antibodies to the toxin. In this event, therapy needs to be modified.  I have read the above information and consent to the administration of botulism toxin.    BOTOX  PROCEDURE NOTE FOR MIGRAINE HEADACHE  Contraindications and precautions discussed with patient(above). Aseptic procedure was observed and patient tolerated procedure. Procedure performed by Greig Forbes, FNP-C.   The condition has existed for more than 6 months, and pt does not have a diagnosis of ALS, Myasthenia Gravis or Lambert-Eaton Syndrome.  Risks and benefits of injections discussed and pt agrees to proceed with the procedure.  Written consent obtained  These injections are medically necessary. Pt  receives good benefits from these injections. These injections do not cause sedations or hallucinations which the oral therapies may cause.   Description of procedure:  The patient  was placed in a sitting position. The standard protocol was used for Botox  as follows, with 5 units of Botox  injected at each site:  -Procerus muscle, midline injection  -Corrugator muscle, bilateral injection  -Frontalis muscle, bilateral injection, with 2 sites each side, medial injection was performed in the upper one third of the frontalis muscle, in the region vertical from the medial inferior edge of the superior orbital rim. The lateral injection was again in the upper one third of the forehead vertically above the lateral limbus of the cornea, 1.5 cm lateral to the medial injection site.  -Temporalis muscle injection, 4 sites, bilaterally. The first injection was 3 cm above the tragus of the ear, second injection site was 1.5 cm to 3 cm up from the first injection site in line with the tragus of the ear. The third injection site was 1.5-3 cm forward between the first 2 injection sites. The fourth injection site was 1.5 cm posterior to the second injection site. 5th site laterally in the temporalis  muscleat the level of the outer canthus.  -Occipitalis muscle injection, 3 sites, bilaterally. The first injection was done one half way between the occipital protuberance and the tip of the mastoid process behind the ear. The second injection site was done lateral and superior to the first, 1 fingerbreadth from the first injection. The third injection site was 1 fingerbreadth superiorly and medially from the first injection site.  -Cervical paraspinal muscle injection, 2 sites, bilaterally. The first injection site was 1 cm from the midline of the cervical spine, 3 cm inferior to the lower border of the occipital protuberance. The second injection site was 1.5 cm superiorly and laterally to the first injection site.  -Trapezius muscle injection was performed at 3 sites, bilaterally. The first injection site was in the upper trapezius muscle halfway between the inflection point of the neck, and the  acromion. The second injection site was one half way between the acromion and the first injection site. The third injection was done between the first injection site and the inflection point of the neck.  - Masseter muscle injections bilaterally 5 units each    Will return for repeat injection in 3 months.   A total of 200 units of Botox  was prepared, 165 units of Botox  was injected as documented above, any Botox  not injected was wasted. The patient tolerated the procedure well, there were no complications of the above procedure.

## 2024-02-13 ENCOUNTER — Encounter: Payer: Self-pay | Admitting: Family Medicine

## 2024-02-13 ENCOUNTER — Ambulatory Visit: Admitting: Family Medicine

## 2024-02-13 DIAGNOSIS — G43709 Chronic migraine without aura, not intractable, without status migrainosus: Secondary | ICD-10-CM

## 2024-03-01 ENCOUNTER — Encounter (HOSPITAL_COMMUNITY)

## 2024-03-04 ENCOUNTER — Other Ambulatory Visit: Payer: Self-pay | Admitting: Adult Health

## 2024-03-04 DIAGNOSIS — G4700 Insomnia, unspecified: Secondary | ICD-10-CM

## 2024-03-05 ENCOUNTER — Ambulatory Visit: Admitting: Gastroenterology

## 2024-03-05 NOTE — Progress Notes (Deleted)
 Sonya Gibson [id number] [birth year]   Chief Complaint:   Referring Provider: Claudis Meeter [facility] Primary GI MD: Dr. Wilhelmenia  HPI: Sonya Gibson is a 38 y.o. female with past medical history of T2DM, GERD, hypothyroidism, migraines, HTN, chronic back pain, chronic opioid use, drug-seeking behavior, anxiety/depression, PTSD who presents today for follow up.  10/26/2023 patient seen at [facility] ED for nausea, vomiting, and upper abdominal pain.  Actively vomiting in the ED. Labs and imaging were unremarkable.  ED provider suspected drug withdrawal as patient is on narcotics and benzos for multiple medical problems.   10/27/2023 patient seen at [facility], ED for a few days of severe abdominal pain, nausea, vomiting, p.o. intolerance.  Reported that after discharge from other facility, symptoms persisted with inability to take anything orally.  Patient received continuous monitoring, droperidol , fluids.  Labs notable for mild dehydration, mild hypokalemia, otherwise reassuring.  Cyclic vomiting syndrome was considered as well.   10/28/2023 patient seen again at [facility] ED with persistent nausea and vomiting and reduced tolerance for p.o. intake.  Had been prescribed small quantity of morphine  tablets and Phenergan  the day before, was able to get the Phenergan  filled but pharmacy did not have the morphine  prescription and she was told she had to be reevaluated as prescription could not be sent to another pharmacy.  Patient denied any worsening of symptoms.  Was prescribed small quantity of morphine .   Labs 11/15/2023: Normal CBC, low vitamin B12 134, elevated glucose, otherwise normal CMP, hyperlipidemia, TSH 5.498, low vitamin D 19.8   11/29/2023 saw endocrinologist with recommendation to reduce Mounjaro to 7.5mg  for a few weeks before consider going back to 10mg  dose.  At last visit 12/01/2023 patient endorsed episodes of nausea and vomiting ongoing since she was a child which would occur  1-2 times per month and which had recently worsened to vomiting 2-3 times per week.  Multiple ED visits for this.  Had previous GI workup to include barium swallow, EGD, colonoscopy.  Was on 7.5 mg of Mounjaro which have been helpful with weight loss and and lowering hemoglobin A1c, though possibly contributing to worsening symptoms.  Patient endorsed vomiting food she had eaten 1 to 2 days prior. Also endorsed chronic constipation, going up to 2-1/2 weeks without a bowel movement and doing a MiraLAX  purge about once a month but not on any maintenance medications.  Some occasional BRBPR, ongoing since prior to last colonoscopy and unchanged.  Gastric emptying study was ordered.  Started on Linzess  145 mcg daily. Calmol suppositories given.   Dr. Wilhelmenia also recommended a.m. cortisol to be drawn to evaluate for adrenal insufficiency, recommendation to hold Mounjaro for at least a week before gastric emptying study as well as not using antiemetics for at least 24 hours prior.  Labs 12/01/2023: Elevated platelet count, unremarkable CMP, negative celiac disease  Seen in the ED 01/03/2024 for complaint of abdominal pain, nausea, vomiting.  Reported that her husband was admitted to the hospital with Fournier's gangrene which was causing a lot of stress.  Endorsed using delta 8 THC edible to try to manage her nausea but has not worked.  Persistent nausea and vomiting for 3 days, similar to previous episode of cyclic vomiting.  Laboratory evaluation with leukocytosis of 12.7, CO2 19, urinalysis with some mild ketonuria but otherwise unremarkable.  Pregnancy test negative.  UDS positive for benzos, amphetamines, and THC.  Patient is prescribed both amphetamine -based ADHD medication and benzodiazepine anxiety medications.  She was given droperidol   and Ativan  and symptoms started to improve but felt persistently nauseated.  Received an additional dose of Ativan  and Compazine  and on reevaluation was able to tolerate  p.o. without difficulty.  Given normal abdominal exam and symptoms similar to previous episodes of CVS, no CT imaging was performed at that time.  AM cortisol ordered but not completed.  Gastric emptying study ordered but not completed.  Order has expired and will need to be reordered.      She has history of spinal injury.  States she has had 6 surgeries and had adverse event during surgery in which she ended up with a CSF leak and was in ICU for 3 months.  She is now on chronic pain medication.   She has a niece with Crohn's disease.   Former cigarette smoker, quit in 2006.  Denies marijuana use.   Previous GI Procedures/Imaging   CT A/P 10/26/2023 No acute process in the abdomen or pelvis   RUQ US  08/22/2023 1. No acute finding by ultrasound. 2. Probable hepatic steatosis.   CT A/P 08/21/2023 1. 2.6 cm diameter simple cyst within the right adnexa, likely ovarian in origin. No follow-up imaging is recommended. This recommendation follows ACR consensus guidelines: White Paper of the ACR Incidental Findings Committee II on Adnexal Findings. J Am Coll Radiol 2013:10:675-681. 2. Findings which may represent sequelae associated with cystitis. Correlation with urinalysis is recommended.  EGD 11/25/2020 - Normal esophagus.  Biopsied - Z-line regular, 37 cm from the incisors. - Normal stomach. - Normal examined duodenum. Path: - Histologically unremarkable esophageal squamous mucosa, no intraepithelial eosinophils identified  EGD 04/12/2013 - Mild esophagitis - Small hiatal hernia Path: Small bowel, biopsy: Histologically unremarkable duodenal mucosa.   No changes of sprue, active inflammation, or granulomas identified. Clotest negative for H. pylori   Colonoscopy 04/12/2013 - Mild diverticular disease of the right colon Path: Terminal ileum, biopsy Benign small bowel mucosa No active inflammation or granulomas identified Right colon, biopsy Benign colonic mucosa No  microscopic colitis, active inflammation, granulomas, adenomatous or malignant changes identified Left colon, biopsy Benign colonic mucosa No microscopic colitis, active inflammation, granulomas, adenomatous or malignant changes identified   Past Medical History:  Diagnosis Date   Anxiety    Chronic back pain    Depression    Diabetes (HCC)    Drug-seeking behavior    Essential hypertension    GERD (gastroesophageal reflux disease)    Hypothyroidism    Kidney stone    Lumbar radiculopathy, right    Migraine headache    PTSD (post-traumatic stress disorder)     Past Surgical History:  Procedure Laterality Date   ANTERIOR CRUCIATE LIGAMENT REPAIR Left    BACK SURGERY  12/05/2017   CESAREAN SECTION     IR EPIDUROGRAPHY  11/17/2017   KNEE SURGERY     LUMBAR LAMINECTOMY/DECOMPRESSION MICRODISCECTOMY Right 12/05/2017   Procedure: Right L4-5 Microdiskectomy;  Surgeon: Joshua Alm RAMAN, MD;  Location: [facility] OR;  Service: Neurosurgery;  Laterality: Right;   LUMBAR LAMINECTOMY/DECOMPRESSION MICRODISCECTOMY N/A 12/20/2017   Procedure: Repair of CSF Leak;  Surgeon: Joshua Alm RAMAN, MD;  Location: [facility] OR;  Service: Neurosurgery;  Laterality: N/A;   LUMBAR WOUND DEBRIDEMENT N/A 12/27/2017   Procedure: Re-exploration for CSF leak repair and placement of lumbar drain;  Surgeon: Joshua Alm RAMAN, MD;  Location: [facility] OR;  Service: Neurosurgery;  Laterality: N/A;   middle finger reatachment     PLACEMENT OF LUMBAR DRAIN N/A 12/27/2017   Procedure: PLACEMENT OF LUMBAR DRAIN;  Surgeon: Joshua Alm RAMAN, MD;  Location: [facility] OR;  Service: Neurosurgery;  Laterality: N/A;   SPINAL CORD STIMULATOR IMPLANT  03/31/2023   TONSILLECTOMY AND ADENOIDECTOMY  1996   VENTRICULOPERITONEAL SHUNT N/A 01/08/2018   Procedure: Re-exploration and repair of previous Lumbar cerebro-spinal fluid leak and placement of Lumbar drain;  Surgeon: Joshua Alm RAMAN, MD;  Location: [facility] OR;  Service: Neurosurgery;  Laterality: N/A;    Current  Outpatient Medications  Medication Sig Dispense Refill   acetaminophen  (TYLENOL ) 325 MG tablet Take 650 mg by mouth every 6 (six) hours as needed.     albuterol  (VENTOLIN  HFA) 108 (90 Base) MCG/ACT inhaler Inhale 2 puffs into the lungs every 6 (six) hours as needed for wheezing or shortness of breath.     amphetamine -dextroamphetamine  (ADDERALL) 20 MG tablet Take 1 tablet (20 mg total) by mouth 2 (two) times daily. 60 tablet 0   amphetamine -dextroamphetamine  (ADDERALL) 20 MG tablet Take 1 tablet (20 mg total) by mouth 2 (two) times daily. 60 tablet 0   [START ON 03/27/2024] amphetamine -dextroamphetamine  (ADDERALL) 20 MG tablet Take 1 tablet (20 mg total) by mouth 2 (two) times daily. 60 tablet 0   ARIPiprazole  (ABILIFY ) 2 MG tablet Take 1 tablet (2 mg total) by mouth 2 (two) times daily. 60 tablet 2   botulinum toxin Type A  (BOTOX ) 200 units injection Provider to inject 155 units into the muscles of the head and neck every 12 weeks. Discard remainder. 1 each 2   calcium  carbonate (TUMS EX) 750 MG chewable tablet Chew 2 tablets by mouth as needed for heartburn.     celecoxib  (CELEBREX ) 200 MG capsule Take 200 mg by mouth daily.     CHERRY PO Take 1 tablet by mouth daily.     DULoxetine  (CYMBALTA ) 60 MG capsule TAKE TWO CAPSULES BY MOUTH DAILY 180 capsule 0   fluconazole  (DIFLUCAN ) 150 MG tablet Take 150 mg by mouth once.     gabapentin  (NEURONTIN ) 600 MG tablet Take 1,200 mg by mouth 3 (three) times daily.     linaclotide  (LINZESS ) 145 MCG CAPS capsule Take 1 capsule (145 mcg total) by mouth daily before breakfast. 30 capsule 3   LORazepam  (ATIVAN ) 0.5 MG tablet Take 1 tablet (0.5 mg total) by mouth 2 (two) times daily as needed. for anxiety 60 tablet 2   morphine  (MSIR) 15 MG tablet Take 0.5 tablets (7.5 mg total) by mouth every 6 (six) hours as needed for severe pain (pain score 7-10). 10 tablet 0   MOUNJARO 10 MG/0.5ML Pen SMARTSIG:10 Milligram(s) SUB-Q Once a Week     naloxone  (NARCAN ) nasal  spray 4 mg/0.1 mL Place into the nose.     ondansetron  (ZOFRAN -ODT) 4 MG disintegrating tablet DISSOLVE ONE TABLET UNDER THE TONGUE EVERY 8 HOURS AS NEEDED FOR NAUSEA 30 tablet 0   oxyCODONE  (OXYCONTIN ) 10 mg 12 hr tablet Take 10 mg by mouth every 12 (twelve) hours.     promethazine  (PHENERGAN ) 25 MG suppository Place 1 suppository (25 mg total) rectally every 6 (six) hours as needed for nausea or vomiting. 12 each 0   promethazine  (PHENERGAN ) 25 MG tablet Take 1 tablet (25 mg total) by mouth every 6 (six) hours as needed for nausea or vomiting. 120 tablet 0   QUEtiapine  (SEROQUEL ) 100 MG tablet Take 1 tablet (100 mg total) by mouth at bedtime. 30 tablet 5   QUEtiapine  (SEROQUEL ) 200 MG tablet Take 1 tablet (200 mg total) by mouth at bedtime. 30 tablet 2   Rectal  Protectant-Emollient (CALMOL-4) 76-10 % SUPP Use as needed     Rimegepant Sulfate (NURTEC) 75 MG TBDP Take 1 tablet (75 mg total) by mouth daily as needed. For migraines. Take as close to onset of migraine as possible. One daily maximum. 10 tablet 6   rizatriptan  (MAXALT -MLT) 10 MG disintegrating tablet Take 1 tablet (10 mg total) by mouth as needed for migraine. May repeat in 2 hours if needed 9 tablet 11   tiZANidine  (ZANAFLEX ) 4 MG tablet Take 1.5 tablets (6 mg total) by mouth every 8 (eight) hours.     traMADol (ULTRAM-ER) 300 MG 24 hr tablet Take 300 mg by mouth daily.     traZODone  (DESYREL ) 150 MG tablet Take 1 tablet (150 mg total) by mouth at bedtime. 30 tablet 5   Current Facility-Administered Medications  Medication Dose Route Frequency Provider Last Rate Last Admin   botulinum toxin Type A  (BOTOX ) injection 155 Units  155 Units Intramuscular Once Lomax, Amy, NP        Allergies as of 03/05/2024 - Review Complete 01/31/2024  Allergen Reaction Noted   Cinnamon Anaphylaxis 12/20/2017   Clindamycin Nausea And Vomiting 10/11/2019   Coconut (cocos nucifera) Anaphylaxis 08/01/2016   Lovenox  [enoxaparin ] Other (See Comments)  12/20/2017   Nystatin Anaphylaxis 01/23/2019   Aspartame  06/09/2020   Niacin Hives 02/12/2021   Nitrofurantoin Hives 04/26/2010   Other  03/26/2018   Pregabalin  06/06/2020    Family History  Problem Relation Age of Onset   Diabetes Mellitus II Mother    Hypertension Mother    Seizures Mother    Other Mother        post stroke headaches   Stroke Mother        x 23   Pancreatic cancer Mother    Hypertension Father    Diabetes Mellitus II Father    Hyperlipidemia Father    Heart disease Father    Hearing loss Father        was a VET   Bipolar disorder Sister    Autism Daughter        level 3   Other Daughter        tibia torsion & femoral aniversion   Asthma Son    Autism Son    Tourette syndrome Son     Social History   Tobacco Use   Smoking status: Former    Current packs/day: 0.00    Average packs/day: 2.0 packs/day for 3.0 years (6.0 ttl pk-yrs)    Types: Cigarettes    Start date: 2003    Quit date: 2006    Years since quitting: 19.7   Smokeless tobacco: Never  Vaping Use   Vaping status: Never Used  Substance Use Topics   Alcohol use: Yes    Comment: socially   Drug use: No     Review of Systems:    Constitutional: No weight loss, fever, chills, weakness or fatigue Eyes: No change in vision Ears, Nose, Throat:  No change in hearing or congestion Skin: No rash or itching Cardiovascular: No chest pain, chest pressure or palpitations   Respiratory: No SOB or cough Gastrointestinal: See HPI and otherwise negative Genitourinary: No dysuria or change in urinary frequency Neurological: No headache, dizziness or syncope Musculoskeletal: No new muscle or joint pain Hematologic: No bleeding or bruising    Physical Exam:  Vital signs: There were no vitals taken for this visit.  Constitutional: NAD, Well developed, Well nourished, alert and cooperative Head:  Normocephalic and  atraumatic.  Eyes: No scleral icterus. Conjunctiva pink. Mouth: No oral  lesions. Respiratory: Respirations even and unlabored. Lungs clear to auscultation bilaterally.  No wheezes, crackles, or rhonchi.  Cardiovascular:  Regular rate and rhythm. No murmurs. No peripheral edema. Gastrointestinal:  Soft, nondistended, nontender. No rebound or guarding. Normal bowel sounds. No appreciable masses or hepatomegaly. Rectal:  Not performed.  Neurologic:  Alert and oriented x4;  grossly normal neurologically.  Skin:   Dry and intact without significant lesions or rashes. Psychiatric: Oriented to person, place and time. Demonstrates good judgement and reason without abnormal affect or behaviors.   RELEVANT LABS AND IMAGING: CBC    Component Value Date/Time   WBC 12.7 (H) 01/03/2024 0803   RBC 4.65 01/03/2024 0803   HGB 14.2 01/03/2024 0803   HCT 41.2 01/03/2024 0803   PLT 450 (H) 01/03/2024 0803   MCV 88.6 01/03/2024 0803   MCH 30.5 01/03/2024 0803   MCHC 34.5 01/03/2024 0803   RDW 14.6 01/03/2024 0803   LYMPHSABS 1.5 01/03/2024 0803   MONOABS 0.4 01/03/2024 0803   EOSABS 0.0 01/03/2024 0803   BASOSABS 0.0 01/03/2024 0803    CMP     Component Value Date/Time   NA 139 01/03/2024 0803   K 3.9 01/03/2024 0803   CL 103 01/03/2024 0803   CO2 19 (L) 01/03/2024 0803   GLUCOSE 231 (H) 01/03/2024 0803   BUN 15 01/03/2024 0803   CREATININE 0.94 01/03/2024 0803   CALCIUM  9.8 01/03/2024 0803   PROT 8.6 (H) 01/03/2024 0803   ALBUMIN 4.6 01/03/2024 0803   AST 29 01/03/2024 0803   ALT 18 01/03/2024 0803   ALKPHOS 76 01/03/2024 0803   BILITOT 0.5 01/03/2024 0803   GFRNONAA >60 01/03/2024 0803   GFRAA >60 02/21/2020 1033     Assessment/Plan:    AM cortisol GES   Camie Furbish, PA-C [facility] 03/05/2024, 9:48 AM  Patient Care Team: Claudis Meeter [facility] as PCP - General Bobbette Phy, MD as Rounding Team (Internal Medicine) Hyacinth Warren PARAS, FNP (Endocrinology)

## 2024-03-13 ENCOUNTER — Ambulatory Visit: Admitting: Gastroenterology

## 2024-03-18 ENCOUNTER — Other Ambulatory Visit: Payer: Self-pay | Admitting: Adult Health

## 2024-03-18 DIAGNOSIS — F410 Panic disorder [episodic paroxysmal anxiety] without agoraphobia: Secondary | ICD-10-CM

## 2024-03-28 ENCOUNTER — Other Ambulatory Visit: Payer: Self-pay | Admitting: Adult Health

## 2024-03-28 DIAGNOSIS — F410 Panic disorder [episodic paroxysmal anxiety] without agoraphobia: Secondary | ICD-10-CM

## 2024-03-29 ENCOUNTER — Encounter (HOSPITAL_COMMUNITY): Payer: Self-pay

## 2024-03-29 ENCOUNTER — Emergency Department (HOSPITAL_COMMUNITY)
Admission: EM | Admit: 2024-03-29 | Discharge: 2024-03-30 | Disposition: A | Discharge status: Home / Self Care | Attending: Emergency Medicine | Admitting: Emergency Medicine

## 2024-03-29 DIAGNOSIS — S298XXA Other specified injuries of thorax, initial encounter: Secondary | ICD-10-CM | POA: Insufficient documentation

## 2024-03-29 DIAGNOSIS — W19XXXA Unspecified fall, initial encounter: Secondary | ICD-10-CM

## 2024-03-29 DIAGNOSIS — I10 Essential (primary) hypertension: Secondary | ICD-10-CM | POA: Diagnosis not present

## 2024-03-29 DIAGNOSIS — E039 Hypothyroidism, unspecified: Secondary | ICD-10-CM | POA: Diagnosis not present

## 2024-03-29 DIAGNOSIS — S39012A Strain of muscle, fascia and tendon of lower back, initial encounter: Secondary | ICD-10-CM | POA: Diagnosis not present

## 2024-03-29 DIAGNOSIS — Y9301 Activity, walking, marching and hiking: Secondary | ICD-10-CM | POA: Diagnosis not present

## 2024-03-29 DIAGNOSIS — W010XXA Fall on same level from slipping, tripping and stumbling without subsequent striking against object, initial encounter: Secondary | ICD-10-CM | POA: Diagnosis not present

## 2024-03-29 DIAGNOSIS — S29019A Strain of muscle and tendon of unspecified wall of thorax, initial encounter: Secondary | ICD-10-CM | POA: Diagnosis not present

## 2024-03-29 DIAGNOSIS — E119 Type 2 diabetes mellitus without complications: Secondary | ICD-10-CM | POA: Diagnosis not present

## 2024-03-29 DIAGNOSIS — M545 Low back pain, unspecified: Secondary | ICD-10-CM | POA: Diagnosis present

## 2024-03-29 LAB — URINALYSIS, ROUTINE W REFLEX MICROSCOPIC
Glucose, UA: NEGATIVE mg/dL
Hgb urine dipstick: NEGATIVE
Ketones, ur: 5 mg/dL — AB
Nitrite: NEGATIVE
Protein, ur: 100 mg/dL — AB
Specific Gravity, Urine: 1.032 — ABNORMAL HIGH (ref 1.005–1.030)
WBC, UA: >50 WBC/hpf (ref 0–5)
pH: 5 (ref 5.0–8.0)

## 2024-03-29 NOTE — ED Triage Notes (Signed)
 Spine pain Stimulator was placed a year ago. Pt fell last night and pain has been unbearable ever since. Pt has right drop foot since the event. A&Ox4. Pt did land on the stimulator. Pt has tried oxycodone , naproxen , tylenol  and no relief.   Pt wonders if the stimulator is malfunctioned due to the fall. Pt has the stimulator all the way up.

## 2024-03-29 NOTE — ED Notes (Signed)
 Pt approved her husband to sign MSE form

## 2024-03-30 ENCOUNTER — Emergency Department (HOSPITAL_COMMUNITY)

## 2024-03-30 LAB — PREGNANCY, URINE: Preg Test, Ur: NEGATIVE

## 2024-03-30 MED ORDER — HYDROMORPHONE HCL 1 MG/ML IJ SOLN
1.0000 mg | Freq: Once | INTRAMUSCULAR | Status: AC
  Administered 2024-03-30: 1 mg via INTRAMUSCULAR
  Filled 2024-03-30: qty 1

## 2024-03-30 MED ORDER — ONDANSETRON 8 MG PO TBDP
8.0000 mg | ORAL_TABLET | Freq: Once | ORAL | Status: AC
  Administered 2024-03-30: 8 mg via ORAL
  Filled 2024-03-30: qty 1

## 2024-03-30 MED ORDER — HYDROCODONE-ACETAMINOPHEN 5-325 MG PO TABS
2.0000 | ORAL_TABLET | Freq: Once | ORAL | Status: AC
  Administered 2024-03-30: 2 via ORAL
  Filled 2024-03-30: qty 2

## 2024-03-30 NOTE — ED Notes (Signed)
 Pt is able to walk to bathroom and back. Not able to stand up straight.

## 2024-03-30 NOTE — Discharge Instructions (Signed)
 SEEK IMMEDIATE MEDICAL ATTENTION IF: New numbness, tingling, weakness, or problem with the use of your arms or legs.  Severe back pain not relieved with medications.  Change in bowel or bladder control (if you lose control of stool or urine, or if you are unable to urinate) Increasing pain in any areas of the body (such as chest or abdominal pain).  Shortness of breath, dizziness or fainting.  Nausea (feeling sick to your stomach), vomiting, fever, or sweats.

## 2024-03-30 NOTE — ED Provider Notes (Signed)
 [place] EMERGENCY DEPARTMENT AT [facility] Provider Note   CSN: [id number] Arrival date & time: 03/29/24  2041     Patient presents with: No chief complaint on file.   Sonya Gibson is a 38 y.o. female.   The history is provided by the patient and the spouse.  Patient with extensive history of anxiety, chronic pain, previous back surgeries with complications, spinal stimulator in place presents for fall.  Patient reports that she has chronic foot drop on the right foot.  She reports yesterday she was walking when she tripped and landed directly on her back.  No LOC.  She has not pain now throughout her upper and lower back.  She suspects that her right foot drop is getting worse.  No other new weakness.  No new fecal or urinary incontinence. No new head injury  No new neck injury.  No abdominal pain Past Medical History:  Diagnosis Date   Anxiety    Chronic back pain    Depression    Diabetes (HCC)    Drug-seeking behavior    Essential hypertension    GERD (gastroesophageal reflux disease)    Hypothyroidism    Kidney stone    Lumbar radiculopathy, right    Migraine headache    PTSD (post-traumatic stress disorder)     Prior to Admission medications   Medication Sig Start Date End Date Taking? Authorizing Provider  acetaminophen  (TYLENOL ) 325 MG tablet Take 650 mg by mouth every 6 (six) hours as needed.    [provider]  albuterol  (VENTOLIN  HFA) 108 (90 Base) MCG/ACT inhaler Inhale 2 puffs into the lungs every 6 (six) hours as needed for wheezing or shortness of breath.    [provider]  amphetamine -dextroamphetamine  (ADDERALL) 20 MG tablet Take 1 tablet (20 mg total) by mouth 2 (two) times daily. 01/31/24   Mozingo, Regina Nattalie, NP  amphetamine -dextroamphetamine  (ADDERALL) 20 MG tablet Take 1 tablet (20 mg total) by mouth 2 (two) times daily. 02/28/24   Mozingo, Regina Nattalie, NP  amphetamine -dextroamphetamine  (ADDERALL) 20 MG tablet  Take 1 tablet (20 mg total) by mouth 2 (two) times daily. 03/27/24   Mozingo, Regina Nattalie, NP  ARIPiprazole  (ABILIFY ) 2 MG tablet Take 1 tablet (2 mg total) by mouth 2 (two) times daily. 12/26/23   Mozingo, Regina Nattalie, NP  botulinum toxin Type A  (BOTOX ) 200 units injection Provider to inject 155 units into the muscles of the head and neck every 12 weeks. Discard remainder. 02/05/24   Ines Onetha NOVAK, MD  calcium  carbonate (TUMS EX) 750 MG chewable tablet Chew 2 tablets by mouth as needed for heartburn.    [provider]  celecoxib  (CELEBREX ) 200 MG capsule Take 200 mg by mouth daily.    [provider]  CHERRY PO Take 1 tablet by mouth daily.    [provider]  DULoxetine  (CYMBALTA ) 60 MG capsule TAKE TWO CAPSULES BY MOUTH DAILY 02/02/24   Mozingo, Regina Nattalie, NP  fluconazole  (DIFLUCAN ) 150 MG tablet Take 150 mg by mouth once. 08/10/23   [provider]  gabapentin  (NEURONTIN ) 600 MG tablet Take 1,200 mg by mouth 3 (three) times daily.    [provider]  linaclotide  (LINZESS ) 145 MCG CAPS capsule Take 1 capsule (145 mcg total) by mouth daily before breakfast. 12/01/23   Arletta, Sara E, PA-C  LORazepam  (ATIVAN ) 0.5 MG tablet TAKE 1 TABLET BY MOUTH TWICE DAILY AS NEEDED FOR ANXIETY 03/29/24   Mozingo, Regina Nattalie, NP  morphine  (  MSIR) 15 MG tablet Take 0.5 tablets (7.5 mg total) by mouth every 6 (six) hours as needed for severe pain (pain score 7-10). 10/28/23   Idol, Julie, PA-C  MOUNJARO 10 MG/0.5ML Pen SMARTSIG:10 Milligram(s) SUB-Q Once a Week    [provider]  naloxone  (NARCAN ) nasal spray 4 mg/0.1 mL Place into the nose. 09/12/21   [provider]  ondansetron  (ZOFRAN -ODT) 4 MG disintegrating tablet DISSOLVE ONE TABLET UNDER THE TONGUE EVERY 8 HOURS AS NEEDED FOR NAUSEA 12/28/22   Ines Onetha NOVAK, MD  oxyCODONE  (OXYCONTIN ) 10 mg 12 hr tablet Take 10 mg by mouth every 12 (twelve) hours.    [provider]   promethazine  (PHENERGAN ) 25 MG suppository Place 1 suppository (25 mg total) rectally every 6 (six) hours as needed for nausea or vomiting. 10/27/23   Garrick Charleston, MD  promethazine  (PHENERGAN ) 25 MG tablet Take 1 tablet (25 mg total) by mouth every 6 (six) hours as needed for nausea or vomiting. 01/10/23 12/01/23  Waddell Sluder, PA-C  QUEtiapine  (SEROQUEL ) 100 MG tablet Take 1 tablet (100 mg total) by mouth at bedtime. 08/08/23   Mozingo, Regina Nattalie, NP  QUEtiapine  (SEROQUEL ) 200 MG tablet Take 1 tablet (200 mg total) by mouth at bedtime. 12/26/23   Mozingo, Regina Nattalie, NP  Rectal Protectant-Emollient (CALMOL-4) 76-10 % SUPP Use as needed 12/01/23   Arletta, Sara E, PA-C  Rimegepant Sulfate (NURTEC) 75 MG TBDP Take 1 tablet (75 mg total) by mouth daily as needed. For migraines. Take as close to onset of migraine as possible. One daily maximum. 11/01/23   Lomax, Amy, NP  rizatriptan  (MAXALT -MLT) 10 MG disintegrating tablet Take 1 tablet (10 mg total) by mouth as needed for migraine. May repeat in 2 hours if needed 05/12/21   Ines Onetha NOVAK, MD  tiZANidine  (ZANAFLEX ) 4 MG tablet Take 1.5 tablets (6 mg total) by mouth every 8 (eight) hours. 04/13/20   [provider]  traMADol (ULTRAM-ER) 300 MG 24 hr tablet Take 300 mg by mouth daily.    [provider]  traZODone  (DESYREL ) 150 MG tablet TAKE 1 TABLET BY MOUTH AT BEDTIME 03/05/24   Mozingo, Regina Nattalie, NP    Allergies: Cinnamon, Clindamycin, Coconut (cocos nucifera), Lovenox  [enoxaparin ], Nystatin, Aspartame, Niacin, Nitrofurantoin, Other, and Pregabalin    Review of Systems  Constitutional:  Negative for fever.  Gastrointestinal:  Negative for abdominal pain and vomiting.  Musculoskeletal:  Positive for back pain.  Neurological:  Negative for headaches.    Updated Vital Signs BP (!) 119/90   Pulse (!) 102   Temp 98.6 F (37 C)   Resp 15   Ht 1.549 m (5' 1)   Wt 118 kg   SpO2 96%   BMI 49.15 kg/m    Physical Exam CONSTITUTIONAL: Well developed/well nourished, anxious HEAD: Normocephalic/atraumatic EYES: EOMI/PERRL ENMT: Mucous membranes moist NECK: supple no meningeal signs SPINE/BACK: No cervical spine tenderness, diffuse thoracic and lumbar tenderness, scar noted. Stimulator is in place and nontender No bruising CV: S1/S2 noted, no murmurs/rubs/gallops noted Chest-diffuse posterior chest wall tenderness, no crepitus LUNGS: Lungs are clear to auscultation bilaterally, no apparent distress ABDOMEN: soft, nontender, no rebound or guarding GU:no cva tenderness NEURO: Awake/alert, equal motor 5/5 strength noted with the following: hip flexion/knee flexion/extension,  no sensory deficit in any dermatome.  EXTREMITIES: pulses normal, full ROM Pelvis stable All other extremities/joints palpated/ranged and nontender SKIN: warm, color normal PSYCH: Anxious  (all labs ordered are listed, but only abnormal results are displayed) Labs  Reviewed  URINALYSIS, ROUTINE W REFLEX MICROSCOPIC - Abnormal; Notable for the following components:      Result Value   Color, Urine AMBER (*)    APPearance HAZY (*)    Specific Gravity, Urine 1.032 (*)    Bilirubin Urine MODERATE (*)    Ketones, ur 5 (*)    Protein, ur 100 (*)    Leukocytes,Ua TRACE (*)    Bacteria, UA RARE (*)    All other components within normal limits  PREGNANCY, URINE    EKG: EKG Interpretation Date/Time:  Saturday March 30 2024 00:44:01 EDT Ventricular Rate:  113 PR Interval:  148 QRS Duration:  85 QT Interval:  327 QTC Calculation: 449 R Axis:   70  Text Interpretation: Sinus tachycardia Low voltage, precordial leads No significant change since last tracing Confirmed by Midge Golas ([id number]) on 03/30/2024 1:20:04 AM  Radiology: CT T-SPINE NO CHARGE Result Date: 03/30/2024 EXAM: CT THORACIC AND LUMBAR SPINE WITHOUT INTRAVENOUS CONTRAST 03/30/2024 02:00:31 AM TECHNIQUE: CT of the thoracic and lumbar spine  was performed without the administration of intravenous contrast. Multiplanar reformatted images are provided for review. Automated exposure control, iterative reconstruction, and/or weight based adjustment of the mA/kV was utilized to reduce the radiation dose to as low as reasonably achievable. Incidental adrenal and/or renal findings do not require follow up imaging. COMPARISON: None available. CLINICAL HISTORY: Fall. Spine pain. Stimulator was placed a year ago. FINDINGS: BONES AND ALIGNMENT: Normal vertebral body heights. No acute fracture or suspicious bone lesion. Normal alignment. DEGENERATIVE CHANGES: Mild bony degenerative change. Disc bulges at L4-L5 and L5-S1. SOFT TISSUES: No acute abnormality. BONES AND ALIGNMENT: Normal vertebral body heights. No acute fracture or suspicious bone lesion. Normal alignment. DEGENERATIVE CHANGES: No significant degenerative changes. SOFT TISSUES: No acute abnormality. OTHER: Spinal cord stimulator with the distal leads in the dorsal thoracic canal. Leads are intact. IMPRESSION: 1. No acute fracture or traumatic malalignment of the thoracic or lumbar spine. Electronically signed by: Gilmore Molt MD 03/30/2024 02:36 AM EDT RP Workstation: [id number]   CT L-SPINE NO CHARGE Result Date: 03/30/2024 EXAM: CT THORACIC AND LUMBAR SPINE WITHOUT INTRAVENOUS CONTRAST 03/30/2024 02:00:31 AM TECHNIQUE: CT of the thoracic and lumbar spine was performed without the administration of intravenous contrast. Multiplanar reformatted images are provided for review. Automated exposure control, iterative reconstruction, and/or weight based adjustment of the mA/kV was utilized to reduce the radiation dose to as low as reasonably achievable. Incidental adrenal and/or renal findings do not require follow up imaging. COMPARISON: None available. CLINICAL HISTORY: Fall. Spine pain. Stimulator was placed a year ago. FINDINGS: BONES AND ALIGNMENT: Normal vertebral body heights. No acute  fracture or suspicious bone lesion. Normal alignment. DEGENERATIVE CHANGES: Mild bony degenerative change. Disc bulges at L4-L5 and L5-S1. SOFT TISSUES: No acute abnormality. BONES AND ALIGNMENT: Normal vertebral body heights. No acute fracture or suspicious bone lesion. Normal alignment. DEGENERATIVE CHANGES: No significant degenerative changes. SOFT TISSUES: No acute abnormality. OTHER: Spinal cord stimulator with the distal leads in the dorsal thoracic canal. Leads are intact. IMPRESSION: 1. No acute fracture or traumatic malalignment of the thoracic or lumbar spine. Electronically signed by: Gilmore Molt MD 03/30/2024 02:36 AM EDT RP Workstation: [id number]   CT Chest Wo Contrast Result Date: 03/30/2024 CLINICAL DATA:  Recent fall with pain, initial encounter EXAM: CT CHEST, ABDOMEN AND PELVIS WITHOUT CONTRAST TECHNIQUE: Multidetector CT imaging of the chest, abdomen and pelvis was performed following the standard protocol without IV contrast. RADIATION DOSE REDUCTION: This exam was performed according  to the departmental dose-optimization program which includes automated exposure control, adjustment of the mA and/or kV according to patient size and/or use of iterative reconstruction technique. COMPARISON:  None Available. FINDINGS: CT CHEST FINDINGS Cardiovascular: Limited due to lack of IV contrast. Thoracic aorta is within normal limits. No cardiac enlargement is seen. Mediastinum/Nodes: Thoracic inlet is within normal limits. No hilar or mediastinal adenopathy is noted. The esophagus as visualized is within normal limits. Lungs/Pleura: Lungs are well aerated bilaterally. No focal infiltrate or sizable effusion is seen. No parenchymal nodules are noted. Musculoskeletal: Spinal stimulator is noted. No acute bony abnormality is seen. CT ABDOMEN PELVIS FINDINGS Hepatobiliary: Gallbladder is within normal limits. Some heterogeneity of the liver is noted which may be related to geographic fatty  infiltration. Pancreas: Unremarkable. No pancreatic ductal dilatation or surrounding inflammatory changes. Spleen: Normal in size without focal abnormality. Adrenals/Urinary Tract: Adrenal glands are within normal limits. Kidneys show no renal calculi or obstructive changes. The ureters are within normal limits. Bladder is decompressed. Stomach/Bowel: No obstructive or inflammatory changes of the colon are noted. No appendiceal abnormality is seen. Small bowel stomach appear within normal limits Vascular/Lymphatic: No significant vascular findings are present. No enlarged abdominal or pelvic lymph nodes. Reproductive: Uterus and bilateral adnexa are unremarkable. Other: No abdominal wall hernia or abnormality. No abdominopelvic ascites. Musculoskeletal: No acute or significant osseous findings. IMPRESSION: CT of the chest: No acute abnormality noted. CT of the abdomen and pelvis: No acute abnormality noted. Mild heterogeneity of the liver is noted likely related to fatty infiltration. Electronically Signed   By: Oneil Devonshire M.D.   On: 03/30/2024 02:36   CT ABDOMEN PELVIS WO CONTRAST Result Date: 03/30/2024 CLINICAL DATA:  Recent fall with pain, initial encounter EXAM: CT CHEST, ABDOMEN AND PELVIS WITHOUT CONTRAST TECHNIQUE: Multidetector CT imaging of the chest, abdomen and pelvis was performed following the standard protocol without IV contrast. RADIATION DOSE REDUCTION: This exam was performed according to the departmental dose-optimization program which includes automated exposure control, adjustment of the mA and/or kV according to patient size and/or use of iterative reconstruction technique. COMPARISON:  None Available. FINDINGS: CT CHEST FINDINGS Cardiovascular: Limited due to lack of IV contrast. Thoracic aorta is within normal limits. No cardiac enlargement is seen. Mediastinum/Nodes: Thoracic inlet is within normal limits. No hilar or mediastinal adenopathy is noted. The esophagus as visualized is  within normal limits. Lungs/Pleura: Lungs are well aerated bilaterally. No focal infiltrate or sizable effusion is seen. No parenchymal nodules are noted. Musculoskeletal: Spinal stimulator is noted. No acute bony abnormality is seen. CT ABDOMEN PELVIS FINDINGS Hepatobiliary: Gallbladder is within normal limits. Some heterogeneity of the liver is noted which may be related to geographic fatty infiltration. Pancreas: Unremarkable. No pancreatic ductal dilatation or surrounding inflammatory changes. Spleen: Normal in size without focal abnormality. Adrenals/Urinary Tract: Adrenal glands are within normal limits. Kidneys show no renal calculi or obstructive changes. The ureters are within normal limits. Bladder is decompressed. Stomach/Bowel: No obstructive or inflammatory changes of the colon are noted. No appendiceal abnormality is seen. Small bowel stomach appear within normal limits Vascular/Lymphatic: No significant vascular findings are present. No enlarged abdominal or pelvic lymph nodes. Reproductive: Uterus and bilateral adnexa are unremarkable. Other: No abdominal wall hernia or abnormality. No abdominopelvic ascites. Musculoskeletal: No acute or significant osseous findings. IMPRESSION: CT of the chest: No acute abnormality noted. CT of the abdomen and pelvis: No acute abnormality noted. Mild heterogeneity of the liver is noted likely related to fatty infiltration. Electronically Signed  By: Oneil Devonshire M.D.   On: 03/30/2024 02:36     Procedures   Medications Ordered in the ED  HYDROcodone -acetaminophen  (NORCO/VICODIN) 5-325 MG per tablet 2 tablet (has no administration in time range)  HYDROmorphone  (DILAUDID ) injection 1 mg (1 mg Intramuscular Given 03/30/24 0107)  ondansetron  (ZOFRAN -ODT) disintegrating tablet 8 mg (8 mg Oral Given 03/30/24 0107)    Clinical Course as of 03/30/24 0315  Sat Mar 30, 2024  0125 Patient presents after mechanical fall.  She landed directly on her back.  She has  diffuse thoracic and lumbar tenderness She does not appear to have any neurodeficits.  She also has pain up into her posterior chest as well.  Will obtain CT imaging of chest abdomen pelvis with spinal films [DW]    Clinical Course User Index [DW] Midge Golas, MD                                 Medical Decision Making Amount and/or Complexity of Data Reviewed Labs: ordered. Radiology: ordered.  Risk Prescription drug management.   This patient presents to the ED for concern of fall with back pain, this involves an extensive number of treatment options, and is a complaint that carries with it a high risk of complications and morbidity.  The differential diagnosis includes but is not limited to thoracic spinal fracture, lumbar spinal fracture, muscle strain, spinal stimulator dysfunction  Comorbidities that complicate the patient evaluation: Patient's presentation is complicated by their history of multiple back surgeries  Social Determinants of Health: Patient's chronic pain  increases the complexity of managing their presentation  Additional history obtained: Additional history obtained from spouse Records reviewed Care Everywhere/External Records  Lab Tests: I Ordered, and personally interpreted labs.  The pertinent results include: Pregnancy test negative  Imaging Studies ordered: I ordered imaging studies including CT scan CT spinal imaging  I independently visualized and interpreted imaging which showed no acute traumatic injuries I agree with the radiologist interpretation  Medicines ordered and prescription drug management: I ordered medication including Dilaudid  for pain Reevaluation of the patient after these medicines showed that the patient    improved  Reevaluation: After the interventions noted above, I reevaluated the patient and found that they have :improved  Complexity of problems addressed: Patient's presentation is most consistent with  acute  presentation with potential threat to life or bodily function  Disposition: After consideration of the diagnostic results and the patient's response to treatment,  I feel that the patent would benefit from discharge  .   Patient is improved, no acute traumatic injuries. Patient is able to ambulate. Spinal stimulator is intact Patient safe for discharge.  She requested steroids, but I advised her to follow-up with her spine specialist for further medication management     Final diagnoses:  Fall, initial encounter  Blunt trauma to chest, initial encounter  Strain of lumbar region, initial encounter  Thoracic myofascial strain, initial encounter    ED Discharge Orders     None          Midge Golas, MD 03/30/24 [phone]

## 2024-04-05 ENCOUNTER — Other Ambulatory Visit: Payer: Self-pay | Admitting: Adult Health

## 2024-04-05 DIAGNOSIS — F411 Generalized anxiety disorder: Secondary | ICD-10-CM

## 2024-04-05 DIAGNOSIS — G4700 Insomnia, unspecified: Secondary | ICD-10-CM

## 2024-04-08 NOTE — Progress Notes (Deleted)
 04/08/24 ALL: Ruhani returns for Botox . Last procedure 10/2023.   11/01/2023 ALL: Silvano returns for Botox . Last procedure 04/2023. She reports significant worsening in migraines since. With Botox  on board she averages 2-3 migraines per month. Off Botox  she has 2-3 per week. She was recently diagnosed with cyclic vomiting. Event proceeded by migraine. IV fluids required for three days. She continues Nurtec and rizatriptan  as needed.   05/04/2023 ALL: She returns for Botox . She reports headaches have been a little worse over the past 12 weeks. She had nerve stimulator placed and is having some pain. Nurtec does help. Masseter injections are helpful. She will journal headaches over next 12 weeks.   02/07/2023 ALL: She has been followed by Dr Ines for Botox  therapy. 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 Nurtec works well for abortive therapy. She requests masseter injections. She has significant history of clenching requiring molar implants. She is a paramedic.     Consent Form Botulism Toxin Injection For Chronic Migraine    Reviewed orally with patient, additionally signature is on file:  Botulism toxin has been approved by the Federal drug administration for treatment of chronic migraine. Botulism toxin does not cure chronic migraine and it may not be effective in some patients.  The administration of botulism toxin is accomplished by injecting a small amount of toxin into the muscles of the neck and head. Dosage must be titrated for each individual. Any benefits resulting from botulism toxin tend to wear off after 3 months with a repeat injection required if benefit is to be maintained. Injections are usually done every 3-4 months with maximum effect peak achieved by about 2 or 3 weeks. Botulism toxin is expensive and you should be sure of what costs you will incur resulting from the injection.  The side effects of botulism toxin use for chronic migraine may  include:   -Transient, and usually mild, facial weakness with facial injections  -Transient, and usually mild, head or neck weakness with head/neck injections  -Reduction or loss of forehead facial animation due to forehead muscle weakness  -Eyelid drooping  -Dry eye  -Pain at the site of injection or bruising at the site of injection  -Double vision  -Potential unknown long term risks   Contraindications: You should not have Botox  if you are pregnant, nursing, allergic to albumin, have an infection, skin condition, or muscle weakness at the site of the injection, or have myasthenia gravis, Lambert-Eaton syndrome, or ALS.  It is also possible that as with any injection, there may be an allergic reaction or no effect from the medication. Reduced effectiveness after repeated injections is sometimes seen and rarely infection at the injection site may occur. All care will be taken to prevent these side effects. If therapy is given over a long time, atrophy and wasting in the muscle injected may occur. Occasionally the patient's become refractory to treatment because they develop antibodies to the toxin. In this event, therapy needs to be modified.  I have read the above information and consent to the administration of botulism toxin.    BOTOX  PROCEDURE NOTE FOR MIGRAINE HEADACHE  Contraindications and precautions discussed with patient(above). Aseptic procedure was observed and patient tolerated procedure. Procedure performed by Greig Forbes, FNP-C.   The condition has existed for more than 6 months, and pt does not have a diagnosis of ALS, Myasthenia Gravis or Lambert-Eaton Syndrome.  Risks and benefits of injections discussed and pt agrees to proceed with the procedure.  Written  consent obtained  These injections are medically necessary. Pt  receives good benefits from these injections. These injections do not cause sedations or hallucinations which the oral therapies may cause.   Description  of procedure:  The patient was placed in a sitting position. The standard protocol was used for Botox  as follows, with 5 units of Botox  injected at each site:  -Procerus muscle, midline injection  -Corrugator muscle, bilateral injection  -Frontalis muscle, bilateral injection, with 2 sites each side, medial injection was performed in the upper one third of the frontalis muscle, in the region vertical from the medial inferior edge of the superior orbital rim. The lateral injection was again in the upper one third of the forehead vertically above the lateral limbus of the cornea, 1.5 cm lateral to the medial injection site.  -Temporalis muscle injection, 4 sites, bilaterally. The first injection was 3 cm above the tragus of the ear, second injection site was 1.5 cm to 3 cm up from the first injection site in line with the tragus of the ear. The third injection site was 1.5-3 cm forward between the first 2 injection sites. The fourth injection site was 1.5 cm posterior to the second injection site. 5th site laterally in the temporalis  muscleat the level of the outer canthus.  -Occipitalis muscle injection, 3 sites, bilaterally. The first injection was done one half way between the occipital protuberance and the tip of the mastoid process behind the ear. The second injection site was done lateral and superior to the first, 1 fingerbreadth from the first injection. The third injection site was 1 fingerbreadth superiorly and medially from the first injection site.  -Cervical paraspinal muscle injection, 2 sites, bilaterally. The first injection site was 1 cm from the midline of the cervical spine, 3 cm inferior to the lower border of the occipital protuberance. The second injection site was 1.5 cm superiorly and laterally to the first injection site.  -Trapezius muscle injection was performed at 3 sites, bilaterally. The first injection site was in the upper trapezius muscle halfway between the inflection  point of the neck, and the acromion. The second injection site was one half way between the acromion and the first injection site. The third injection was done between the first injection site and the inflection point of the neck.  - Masseter muscle injections bilaterally 5 units each    Will return for repeat injection in 3 months.   A total of 200 units of Botox  was prepared, 165 units of Botox  was injected as documented above, any Botox  not injected was wasted. The patient tolerated the procedure well, there were no complications of the above procedure.

## 2024-04-09 ENCOUNTER — Ambulatory Visit: Admitting: Family Medicine

## 2024-04-09 DIAGNOSIS — G43709 Chronic migraine without aura, not intractable, without status migrainosus: Secondary | ICD-10-CM

## 2024-04-22 NOTE — Progress Notes (Unsigned)
 04/24/24 ALL: Sonya Gibson returns for Botox . Last procedure 10/2023. She reports worsening headaches since being off schedule. Nurtec continues to help with abortive therapy. Masseter injections have been helpful.   11/01/2023 ALL: Sonya Gibson returns for Botox . Last procedure 04/2023. She reports significant worsening in migraines since. With Botox  on board she averages 2-3 migraines per month. Off Botox  she has 2-3 per week. She was recently diagnosed with cyclic vomiting. Event proceeded by migraine. IV fluids required for three days. She continues Nurtec and rizatriptan  as needed.   05/04/2023 ALL: She returns for Botox . She reports headaches have been a little worse over the past 12 weeks. She had nerve stimulator placed and is having some pain. Nurtec does help. Masseter injections are helpful. She will journal headaches over next 12 weeks.   02/07/2023 ALL: She has been followed by Dr Ines for Botox  therapy. 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 Nurtec works well for abortive therapy. She requests masseter injections. She has significant history of clenching requiring molar implants. She is a paramedic.     Consent Form Botulism Toxin Injection For Chronic Migraine    Reviewed orally with patient, additionally signature is on file:  Botulism toxin has been approved by the Federal drug administration for treatment of chronic migraine. Botulism toxin does not cure chronic migraine and it may not be effective in some patients.  The administration of botulism toxin is accomplished by injecting a small amount of toxin into the muscles of the neck and head. Dosage must be titrated for each individual. Any benefits resulting from botulism toxin tend to wear off after 3 months with a repeat injection required if benefit is to be maintained. Injections are usually done every 3-4 months with maximum effect peak achieved by about 2 or 3 weeks. Botulism toxin is expensive and you should be  sure of what costs you will incur resulting from the injection.  The side effects of botulism toxin use for chronic migraine may include:   -Transient, and usually mild, facial weakness with facial injections  -Transient, and usually mild, head or neck weakness with head/neck injections  -Reduction or loss of forehead facial animation due to forehead muscle weakness  -Eyelid drooping  -Dry eye  -Pain at the site of injection or bruising at the site of injection  -Double vision  -Potential unknown long term risks   Contraindications: You should not have Botox  if you are pregnant, nursing, allergic to albumin, have an infection, skin condition, or muscle weakness at the site of the injection, or have myasthenia gravis, Lambert-Eaton syndrome, or ALS.  It is also possible that as with any injection, there may be an allergic reaction or no effect from the medication. Reduced effectiveness after repeated injections is sometimes seen and rarely infection at the injection site may occur. All care will be taken to prevent these side effects. If therapy is given over a long time, atrophy and wasting in the muscle injected may occur. Occasionally the patient's become refractory to treatment because they develop antibodies to the toxin. In this event, therapy needs to be modified.  I have read the above information and consent to the administration of botulism toxin.    BOTOX  PROCEDURE NOTE FOR MIGRAINE HEADACHE  Contraindications and precautions discussed with patient(above). Aseptic procedure was observed and patient tolerated procedure. Procedure performed by Greig Forbes, FNP-C.   The condition has existed for more than 6 months, and pt does not have a diagnosis of ALS, Myasthenia Gravis  or Lambert-Eaton Syndrome.  Risks and benefits of injections discussed and pt agrees to proceed with the procedure.  Written consent obtained  These injections are medically necessary. Pt  receives good benefits  from these injections. These injections do not cause sedations or hallucinations which the oral therapies may cause.   Description of procedure:  The patient was placed in a sitting position. The standard protocol was used for Botox  as follows, with 5 units of Botox  injected at each site:  -Procerus muscle, midline injection  -Corrugator muscle, bilateral injection  -Frontalis muscle, bilateral injection, with 2 sites each side, medial injection was performed in the upper one third of the frontalis muscle, in the region vertical from the medial inferior edge of the superior orbital rim. The lateral injection was again in the upper one third of the forehead vertically above the lateral limbus of the cornea, 1.5 cm lateral to the medial injection site.  -Temporalis muscle injection, 4 sites, bilaterally. The first injection was 3 cm above the tragus of the ear, second injection site was 1.5 cm to 3 cm up from the first injection site in line with the tragus of the ear. The third injection site was 1.5-3 cm forward between the first 2 injection sites. The fourth injection site was 1.5 cm posterior to the second injection site. 5th site laterally in the temporalis  muscleat the level of the outer canthus.  -Occipitalis muscle injection, 3 sites, bilaterally. The first injection was done one half way between the occipital protuberance and the tip of the mastoid process behind the ear. The second injection site was done lateral and superior to the first, 1 fingerbreadth from the first injection. The third injection site was 1 fingerbreadth superiorly and medially from the first injection site.  -Cervical paraspinal muscle injection, 2 sites, bilaterally. The first injection site was 1 cm from the midline of the cervical spine, 3 cm inferior to the lower border of the occipital protuberance. The second injection site was 1.5 cm superiorly and laterally to the first injection site.  -Trapezius muscle  injection was performed at 3 sites, bilaterally. The first injection site was in the upper trapezius muscle halfway between the inflection point of the neck, and the acromion. The second injection site was one half way between the acromion and the first injection site. The third injection was done between the first injection site and the inflection point of the neck.  - Masseter muscle injections bilaterally 5 units each    Will return for repeat injection in 3 months.   A total of 200 units of Botox  was prepared, 165 units of Botox  was injected as documented above, any Botox  not injected was wasted. The patient tolerated the procedure well, there were no complications of the above procedure.

## 2024-04-24 ENCOUNTER — Ambulatory Visit: Admitting: Family Medicine

## 2024-04-24 ENCOUNTER — Ambulatory Visit (INDEPENDENT_AMBULATORY_CARE_PROVIDER_SITE_OTHER): Admitting: Family Medicine

## 2024-04-24 ENCOUNTER — Encounter: Payer: Self-pay | Admitting: Family Medicine

## 2024-04-24 ENCOUNTER — Other Ambulatory Visit: Payer: Self-pay

## 2024-04-24 DIAGNOSIS — G43709 Chronic migraine without aura, not intractable, without status migrainosus: Secondary | ICD-10-CM | POA: Diagnosis not present

## 2024-04-24 MED ORDER — ONABOTULINUMTOXINA 200 UNITS IJ SOLR
155.0000 [IU] | Freq: Once | INTRAMUSCULAR | Status: AC
  Administered 2024-04-24: 165 [IU] via INTRAMUSCULAR

## 2024-04-24 NOTE — Progress Notes (Signed)
 Botox - 200 units x 1 vial Lot: [id number] Expiration: 2027/77 NDC: 0023-3921-02  Bacteriostatic 0.9% Sodium Chloride - 4 mL  Lot: [id number] Expiration: 04/19/25 NDC: 9590803397  Dx: H56.290 S/P  Witnessed by VEAR MEISSNER CMA

## 2024-04-25 ENCOUNTER — Other Ambulatory Visit: Payer: Self-pay | Admitting: Adult Health

## 2024-04-25 DIAGNOSIS — F902 Attention-deficit hyperactivity disorder, combined type: Secondary | ICD-10-CM

## 2024-04-26 ENCOUNTER — Other Ambulatory Visit: Payer: Self-pay

## 2024-05-02 ENCOUNTER — Telehealth (INDEPENDENT_AMBULATORY_CARE_PROVIDER_SITE_OTHER): Payer: Self-pay | Admitting: Adult Health

## 2024-05-02 ENCOUNTER — Encounter: Payer: Self-pay | Admitting: Adult Health

## 2024-05-02 DIAGNOSIS — Z0389 Encounter for observation for other suspected diseases and conditions ruled out: Secondary | ICD-10-CM

## 2024-05-02 NOTE — Progress Notes (Signed)
 Patient no show appointment. Did not connect to video. Called x 2 and left VM to call and R/S appointment.

## 2024-05-07 ENCOUNTER — Other Ambulatory Visit (HOSPITAL_COMMUNITY): Payer: Self-pay

## 2024-05-07 ENCOUNTER — Telehealth: Payer: Self-pay

## 2024-05-07 NOTE — Telephone Encounter (Signed)
 Pharmacy Patient Advocate Encounter   Received notification from Fax that prior authorization for Nurtec 75mg  Tablets is required/requested.   Insurance verification completed.   The patient is insured through [employer].   Per test claim: The current 8/30 day co-pay is, $0.  No PA needed at this time. This test claim was processed through [facility]- copay amounts may vary at other pharmacies due to pharmacy/plan contracts, or as the patient moves through the different stages of their insurance plan.

## 2024-05-13 ENCOUNTER — Other Ambulatory Visit: Payer: Self-pay | Admitting: Adult Health

## 2024-05-13 DIAGNOSIS — F411 Generalized anxiety disorder: Secondary | ICD-10-CM

## 2024-05-13 DIAGNOSIS — F4310 Post-traumatic stress disorder, unspecified: Secondary | ICD-10-CM

## 2024-05-13 DIAGNOSIS — G4700 Insomnia, unspecified: Secondary | ICD-10-CM

## 2024-05-13 DIAGNOSIS — F422 Mixed obsessional thoughts and acts: Secondary | ICD-10-CM

## 2024-05-13 NOTE — Telephone Encounter (Signed)
 Please call to schedule FU, was a no show last visit

## 2024-05-14 ENCOUNTER — Ambulatory Visit: Admitting: Family Medicine

## 2024-05-15 NOTE — Telephone Encounter (Signed)
 Scheduled 1.24

## 2024-05-15 NOTE — Telephone Encounter (Signed)
Sent MyChart msg to schedule

## 2024-05-20 ENCOUNTER — Other Ambulatory Visit: Payer: Self-pay | Admitting: Adult Health

## 2024-05-20 ENCOUNTER — Telehealth: Payer: Self-pay | Admitting: Adult Health

## 2024-05-20 DIAGNOSIS — F410 Panic disorder [episodic paroxysmal anxiety] without agoraphobia: Secondary | ICD-10-CM

## 2024-05-20 NOTE — Telephone Encounter (Signed)
 Patient called in for refill on Cymbalta  60mg . Ph: [phone] Appt 12/4 Pharmacy [facility] [address] [place], [place]

## 2024-05-20 NOTE — Telephone Encounter (Addendum)
 RF was sent yesterday. Sent MyChart message with this info.

## 2024-05-22 ENCOUNTER — Emergency Department (HOSPITAL_COMMUNITY)
Admission: EM | Admit: 2024-05-22 | Discharge: 2024-05-22 | Disposition: A | Discharge status: Home / Self Care | Attending: Emergency Medicine | Admitting: Emergency Medicine

## 2024-05-22 ENCOUNTER — Encounter (HOSPITAL_COMMUNITY): Payer: Self-pay

## 2024-05-22 ENCOUNTER — Other Ambulatory Visit: Payer: Self-pay

## 2024-05-22 DIAGNOSIS — I10 Essential (primary) hypertension: Secondary | ICD-10-CM | POA: Insufficient documentation

## 2024-05-22 DIAGNOSIS — G8929 Other chronic pain: Secondary | ICD-10-CM | POA: Insufficient documentation

## 2024-05-22 DIAGNOSIS — Z87891 Personal history of nicotine dependence: Secondary | ICD-10-CM | POA: Insufficient documentation

## 2024-05-22 DIAGNOSIS — Z794 Long term (current) use of insulin: Secondary | ICD-10-CM | POA: Insufficient documentation

## 2024-05-22 DIAGNOSIS — K920 Hematemesis: Secondary | ICD-10-CM | POA: Insufficient documentation

## 2024-05-22 DIAGNOSIS — R109 Unspecified abdominal pain: Secondary | ICD-10-CM | POA: Insufficient documentation

## 2024-05-22 DIAGNOSIS — E119 Type 2 diabetes mellitus without complications: Secondary | ICD-10-CM | POA: Insufficient documentation

## 2024-05-22 DIAGNOSIS — E039 Hypothyroidism, unspecified: Secondary | ICD-10-CM | POA: Insufficient documentation

## 2024-05-22 DIAGNOSIS — R112 Nausea with vomiting, unspecified: Secondary | ICD-10-CM | POA: Insufficient documentation

## 2024-05-22 LAB — COMPREHENSIVE METABOLIC PANEL WITH GFR
ALT: 18 U/L (ref 0–44)
ALT: 15 U/L (ref 0–44)
AST: 26 U/L (ref 15–41)
AST: 18 U/L (ref 15–41)
Albumin: 4.7 g/dL (ref 3.5–5.0)
Albumin: 4.6 g/dL (ref 3.5–5.0)
Alkaline Phosphatase: 81 U/L (ref 38–126)
Alkaline Phosphatase: 72 U/L (ref 38–126)
Anion gap: 16 — ABNORMAL HIGH (ref 5–15)
Anion gap: 18 — ABNORMAL HIGH (ref 5–15)
BUN: 10 mg/dL (ref 6–20)
BUN: 11 mg/dL (ref 6–20)
CO2: 22 mmol/L (ref 22–32)
CO2: 21 mmol/L — ABNORMAL LOW (ref 22–32)
Calcium: 9.8 mg/dL (ref 8.9–10.3)
Calcium: 9.4 mg/dL (ref 8.9–10.3)
Chloride: 103 mmol/L (ref 98–111)
Chloride: 100 mmol/L (ref 98–111)
Creatinine, Ser: 0.73 mg/dL (ref 0.44–1.00)
Creatinine, Ser: 0.73 mg/dL (ref 0.44–1.00)
GFR, Estimated: >60 mL/min (ref >60)
GFR, Estimated: >60 mL/min (ref >60)
Glucose, Bld: 194 mg/dL — ABNORMAL HIGH (ref 70–99)
Glucose, Bld: 174 mg/dL — ABNORMAL HIGH (ref 70–99)
Potassium: 4.3 mmol/L (ref 3.5–5.1)
Potassium: 3.2 mmol/L — ABNORMAL LOW (ref 3.5–5.1)
Sodium: 141 mmol/L (ref 135–145)
Sodium: 140 mmol/L (ref 135–145)
Total Bilirubin: 0.6 mg/dL (ref 0.0–1.2)
Total Bilirubin: 0.6 mg/dL (ref 0.0–1.2)
Total Protein: 8.2 g/dL — ABNORMAL HIGH (ref 6.5–8.1)
Total Protein: 8 g/dL (ref 6.5–8.1)

## 2024-05-22 LAB — LIPASE, BLOOD
Lipase: 12 U/L (ref 11–51)
Lipase: 18 U/L (ref 11–51)

## 2024-05-22 LAB — CBC
HCT: 40.8 % (ref 36.0–46.0)
HCT: 40.4 % (ref 36.0–46.0)
Hemoglobin: 13.9 g/dL (ref 12.0–15.0)
Hemoglobin: 13.6 g/dL (ref 12.0–15.0)
MCH: 30.5 pg (ref 26.0–34.0)
MCH: 30.5 pg (ref 26.0–34.0)
MCHC: 34.1 g/dL (ref 30.0–36.0)
MCHC: 33.7 g/dL (ref 30.0–36.0)
MCV: 89.5 fL (ref 80.0–100.0)
MCV: 90.6 fL (ref 80.0–100.0)
Platelets: 482 K/uL — ABNORMAL HIGH (ref 150–400)
Platelets: 533 K/uL — ABNORMAL HIGH (ref 150–400)
RBC: 4.56 MIL/uL (ref 3.87–5.11)
RBC: 4.46 MIL/uL (ref 3.87–5.11)
RDW: 13.9 % (ref 11.5–15.5)
RDW: 14.3 % (ref 11.5–15.5)
WBC: 9.6 K/uL (ref 4.0–10.5)
WBC: 9.2 K/uL (ref 4.0–10.5)
nRBC: 0 % (ref 0.0–0.2)
nRBC: 0 % (ref 0.0–0.2)

## 2024-05-22 LAB — URINALYSIS, ROUTINE W REFLEX MICROSCOPIC
Bacteria, UA: NONE SEEN
Bilirubin Urine: NEGATIVE
Glucose, UA: NEGATIVE mg/dL
Hgb urine dipstick: NEGATIVE
Ketones, ur: 20 mg/dL — AB
Leukocytes,Ua: NEGATIVE
Nitrite: NEGATIVE
Protein, ur: 100 mg/dL — AB
Specific Gravity, Urine: 1.025 (ref 1.005–1.030)
pH: 7 (ref 5.0–8.0)

## 2024-05-22 LAB — HCG, SERUM, QUALITATIVE: Preg, Serum: NEGATIVE

## 2024-05-22 MED ORDER — LORAZEPAM 2 MG/ML IJ SOLN
1.0000 mg | Freq: Once | INTRAMUSCULAR | Status: AC
  Administered 2024-05-22: 1 mg via INTRAVENOUS
  Filled 2024-05-22: qty 1

## 2024-05-22 MED ORDER — DROPERIDOL 2.5 MG/ML IJ SOLN
2.5000 mg | Freq: Once | INTRAMUSCULAR | Status: AC
  Administered 2024-05-22: 2.5 mg via INTRAVENOUS
  Filled 2024-05-22: qty 2

## 2024-05-22 MED ORDER — LORAZEPAM 2 MG/ML IJ SOLN
0.5000 mg | Freq: Once | INTRAMUSCULAR | Status: AC
  Administered 2024-05-22: 0.5 mg via INTRAVENOUS
  Filled 2024-05-22: qty 1

## 2024-05-22 MED ORDER — SODIUM CHLORIDE 0.9 % IV BOLUS
1000.0000 mL | Freq: Once | INTRAVENOUS | Status: AC
  Administered 2024-05-22: 1000 mL via INTRAVENOUS

## 2024-05-22 MED ORDER — HYDROMORPHONE HCL 1 MG/ML IJ SOLN
1.0000 mg | Freq: Once | INTRAMUSCULAR | Status: AC
  Administered 2024-05-22: 1 mg via INTRAVENOUS
  Filled 2024-05-22: qty 1

## 2024-05-22 NOTE — ED Triage Notes (Signed)
 Pt arrived via POV from home c/o cyclic vomiting since yesterday. Pt reports taking her prescribed medications PTA w/o relief. Pt also endorses upper back pain and reports diarrhea.

## 2024-05-22 NOTE — ED Triage Notes (Signed)
 Pt coming in with complaints of vomiting starting yesterday morning. Not improved with use of zofran  and phenergan  suppositories. Associated frequent diarrhea and cramping abdominal pain before/after vomiting. Denies fever.

## 2024-05-22 NOTE — ED Provider Notes (Signed)
 [facility]-EMERGENCY DEPT [facility] Emergency Department Provider Note MRN:  [id number]  Arrival date & time: 05/22/24     Chief Complaint   Vomiting, Abdominal Pain, and Diarrhea   History of Present Illness   Sonya Gibson is a 38 y.o. year-old female with a history of cyclical vomiting syndrome presenting to the ED with chief complaint of vomiting.  Persistent nausea vomiting and abdominal pain since yesterday morning, now she feels very miserable, cannot tolerate the symptoms any more.  No fever.  Review of Systems  A thorough review of systems was obtained and all systems are negative except as noted in the HPI and PMH.   Patient's Health History    Past Medical History:  Diagnosis Date   Anxiety    Chronic back pain    Depression    Diabetes (HCC)    Drug-seeking behavior    Essential hypertension    GERD (gastroesophageal reflux disease)    Hypothyroidism    Kidney stone    Lumbar radiculopathy, right    Migraine headache    PTSD (post-traumatic stress disorder)     Past Surgical History:  Procedure Laterality Date   ANTERIOR CRUCIATE LIGAMENT REPAIR Left    BACK SURGERY  12/05/2017   CESAREAN SECTION     IR EPIDUROGRAPHY  11/17/2017   KNEE SURGERY     LUMBAR LAMINECTOMY/DECOMPRESSION MICRODISCECTOMY Right 12/05/2017   Procedure: Right L4-5 Microdiskectomy;  Surgeon: Joshua Alm RAMAN, MD;  Location: [facility] OR;  Service: Neurosurgery;  Laterality: Right;   LUMBAR LAMINECTOMY/DECOMPRESSION MICRODISCECTOMY N/A 12/20/2017   Procedure: Repair of CSF Leak;  Surgeon: Joshua Alm RAMAN, MD;  Location: [facility] OR;  Service: Neurosurgery;  Laterality: N/A;   LUMBAR WOUND DEBRIDEMENT N/A 12/27/2017   Procedure: Re-exploration for CSF leak repair and placement of lumbar drain;  Surgeon: Joshua Alm RAMAN, MD;  Location: [facility] OR;  Service: Neurosurgery;  Laterality: N/A;   middle finger reatachment     PLACEMENT OF LUMBAR DRAIN N/A 12/27/2017   Procedure: PLACEMENT OF LUMBAR DRAIN;   Surgeon: Joshua Alm RAMAN, MD;  Location: [facility] OR;  Service: Neurosurgery;  Laterality: N/A;   SPINAL CORD STIMULATOR IMPLANT  03/31/2023   TONSILLECTOMY AND ADENOIDECTOMY  1996   VENTRICULOPERITONEAL SHUNT N/A 01/08/2018   Procedure: Re-exploration and repair of previous Lumbar cerebro-spinal fluid leak and placement of Lumbar drain;  Surgeon: Joshua Alm RAMAN, MD;  Location: [facility] OR;  Service: Neurosurgery;  Laterality: N/A;    Family History  Problem Relation Age of Onset   Diabetes Mellitus II Mother    Hypertension Mother    Seizures Mother    Other Mother        post stroke headaches   Stroke Mother        x 36   Pancreatic cancer Mother    Hypertension Father    Diabetes Mellitus II Father    Hyperlipidemia Father    Heart disease Father    Hearing loss Father        was a VET   Bipolar disorder Sister    Autism Daughter        level 3   Other Daughter        tibia torsion & femoral aniversion   Asthma Son    Autism Son    Tourette syndrome Son     Social History   Socioeconomic History   Marital status: Married    Spouse name: Arvella   Number of children: 3   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    Highest education level: Not on file  Occupational History   Not on file  Tobacco Use   Smoking status: Former    Current packs/day: 0.00    Average packs/day: 2.0 packs/day for 3.0 years (6.0 ttl pk-yrs)    Types: Cigarettes    Start date: 2003    Quit date: 2006    Years since quitting: 19.9   Smokeless tobacco: Never  Vaping Use   Vaping status: Never Used  Substance and Sexual Activity   Alcohol use: Yes    Comment: socially   Drug use: No   Sexual activity: Yes    Birth control/protection: None  Other Topics Concern   Not on file  Social History Narrative   Lives at home with husband and kids   Right handed   Caffeine : 6 cups daily   Social Drivers of Health   Financial Resource Strain: Low Risk (01/03/2022)   Received from [facility]   Overall  Financial Resource Strain (CARDIA)    Difficulty of Paying Living Expenses: Not very hard  Food Insecurity: Low Risk  (11/29/2023)   Received from [facility]   Hunger Vital Sign    Within the past 12 months, you worried that your food would run out before you got money to buy more: Never true    Within the past 12 months, the food you bought just didn't last and you didn't have money to get more. : Never true  Transportation Needs: No Transportation Needs (11/29/2023)   Received from [facility]    In the past 12 months, has lack of reliable transportation kept you from medical appointments, meetings, work or from getting things needed for daily living? : No  Physical Activity: Not on file  Stress: Not on file  Social Connections: Not on file  Intimate Partner Violence: Not on file     Physical Exam   Vitals:   05/22/24 0358 05/22/24 0401  BP:  132/87  Pulse: 72   Resp: (!) 24   Temp: 98.2 F (36.8 C)   SpO2: 99%     CONSTITUTIONAL: Well-appearing, NAD NEURO/PSYCH:  Alert and oriented x 3, no focal deficits EYES:  eyes equal and reactive ENT/NECK:  no LAD, no JVD CARDIO: Regular rate, well-perfused, normal S1 and S2 PULM:  CTAB no wheezing or rhonchi GI/GU:  non-distended, non-tender MSK/SPINE:  No gross deformities, no edema SKIN:  no rash, atraumatic   *Additional and/or pertinent findings included in MDM below  Diagnostic and Interventional Summary    EKG Interpretation Date/Time:  Wednesday May 22 2024 04:16:29 EST Ventricular Rate:  73 PR Interval:  126 QRS Duration:  60 QT Interval:  533 QTC Calculation: 580 R Axis:   44  Text Interpretation: Sinus rhythm Atrial premature complexes Abnormal R-wave progression, early transition Nonspecific T abnormalities, lateral leads Prolonged QT interval Confirmed by Theadore Sharper [phone]) on 05/22/2024 4:45:24 AM       Labs Reviewed  CBC - Abnormal; Notable for the following components:       Result Value   Platelets 482 (*)    All other components within normal limits  COMPREHENSIVE METABOLIC PANEL WITH GFR - Abnormal; Notable for the following components:   Glucose, Bld 194 (*)    Total Protein 8.2 (*)    Anion gap 16 (*)    All other components within normal limits  LIPASE, BLOOD  HCG, SERUM, QUALITATIVE    No orders  to display    Medications  sodium chloride  0.9 % bolus 1,000 mL (1,000 mLs Intravenous New Bag/Given 05/22/24 0418)  droperidol  (INAPSINE ) 2.5 MG/ML injection 2.5 mg (2.5 mg Intravenous Given 05/22/24 0416)  LORazepam  (ATIVAN ) injection 1 mg (1 mg Intravenous Given 05/22/24 0501)  HYDROmorphone  (DILAUDID ) injection 1 mg (1 mg Intravenous Given 05/22/24 0527)     Procedures  /  Critical Care Procedures  ED Course and Medical Decision Making  Initial Impression and Ddx Suspect exacerbation of cyclical vomiting, abdomen is soft.  Vitals reassuring.  Providing droperidol  and will reassess.  Past medical/surgical history that increases complexity of ED encounter: Cyclical vomiting  Interpretation of Diagnostics I personally reviewed the EKG and my interpretation is as follows: Sinus rhythm with prolonged QT interval  No significant blood count or electrolyte disturbance.  Patient Reassessment and Ultimate Disposition/Management     Patient feeling better and would like to go home.  With this improvement and reassuring workup she is discharged with return precautions.  Patient management required discussion with the following services or consulting groups:  None  Complexity of Problems Addressed Acute illness or injury that poses threat of life of bodily function  Additional Data Reviewed and Analyzed Further history obtained from: Further history from spouse/family member  Additional Factors Impacting ED Encounter Risk Use of parenteral controlled substances and Consideration of hospitalization  Ozell HERO. Theadore, MD [facility]  Medicine [facility] [email or web address]  Final Clinical Impressions(s) / ED Diagnoses     ICD-10-CM   1. Nausea and vomiting, unspecified vomiting type  R11.2       ED Discharge Orders     None        Discharge Instructions Discussed with and Provided to Patient:     Discharge Instructions      You were evaluated in the Emergency Department and after careful evaluation, we did not find any emergent condition requiring admission or further testing in the hospital.  Your exam/testing today is overall reassuring.  Please return to the Emergency Department if you experience any worsening of your condition.   Thank you for allowing us  to be a part of your care.       Theadore Ozell HERO, MD 05/22/24 [phone]

## 2024-05-22 NOTE — Discharge Instructions (Signed)
 Continue taking your medicines at home.  Follow-up with the gastroenterologist

## 2024-05-22 NOTE — Discharge Instructions (Signed)
You were evaluated in the Emergency Department and after careful evaluation, we did not find any emergent condition requiring admission or further testing in the hospital.  Your exam/testing today is overall reassuring.  Please return to the Emergency Department if you experience any worsening of your condition.   Thank you for allowing us to be a part of your care. 

## 2024-05-23 ENCOUNTER — Ambulatory Visit: Admitting: Adult Health

## 2024-05-23 ENCOUNTER — Encounter: Payer: Self-pay | Admitting: Adult Health

## 2024-05-23 DIAGNOSIS — Z0389 Encounter for observation for other suspected diseases and conditions ruled out: Secondary | ICD-10-CM

## 2024-05-23 NOTE — Progress Notes (Signed)
 Patient unable to attend appointment today. Discharged from hospital today. Will call for an appointment.

## 2024-05-24 NOTE — ED Provider Notes (Signed)
 [place] EMERGENCY DEPARTMENT AT [facility] Provider Note   CSN: [id number] Arrival date & time: 05/22/24  8361     Patient presents with: Emesis   Sonya Gibson is a 38 y.o. female.   Patient complains of continued vomiting.  She has a history of cyclic vomiting  The history is provided by the patient and medical records. No language interpreter was used.  Emesis Severity:  Moderate Timing:  Constant Quality:  Bilious material Able to tolerate:  Liquids Progression:  Worsening Chronicity:  Recurrent Recent urination:  Normal Relieved by:  Nothing Worsened by:  Nothing Ineffective treatments:  None tried Associated symptoms: no abdominal pain, no cough, no diarrhea and no headaches   Risk factors: no alcohol use        Prior to Admission medications   Medication Sig Start Date End Date Taking? Authorizing Provider  acetaminophen  (TYLENOL ) 325 MG tablet Take 650 mg by mouth every 6 (six) hours as needed.    [provider]  albuterol  (VENTOLIN  HFA) 108 (90 Base) MCG/ACT inhaler Inhale 2 puffs into the lungs every 6 (six) hours as needed for wheezing or shortness of breath.    [provider]  ARIPiprazole  (ABILIFY ) 2 MG tablet TAKE 1 TABLET BY MOUTH TWICE DAILY 05/19/24   Mozingo, Regina Nattalie, NP  botulinum toxin Type A  (BOTOX ) 200 units injection Provider to inject 155 units into the muscles of the head and neck every 12 weeks. Discard remainder. 02/05/24   Ines Onetha NOVAK, MD  calcium  carbonate (TUMS EX) 750 MG chewable tablet Chew 2 tablets by mouth as needed for heartburn.    [provider]  celecoxib  (CELEBREX ) 200 MG capsule Take 200 mg by mouth daily.    [provider]  CHERRY PO Take 1 tablet by mouth daily.    [provider]  DULoxetine  (CYMBALTA ) 60 MG capsule TAKE TWO CAPSULES BY MOUTH DAILY 05/19/24   Mozingo, Regina Nattalie, NP  fluconazole  (DIFLUCAN ) 150 MG tablet Take 150 mg by mouth once.  08/10/23   [provider]  gabapentin  (NEURONTIN ) 600 MG tablet Take 1,200 mg by mouth 3 (three) times daily.    [provider]  linaclotide  (LINZESS ) 145 MCG CAPS capsule Take 1 capsule (145 mcg total) by mouth daily before breakfast. Patient not taking: Reported on 04/24/2024 12/01/23   Heinz, Sara E, PA-C  LORazepam  (ATIVAN ) 0.5 MG tablet TAKE 1 TABLET BY MOUTH TWICE DAILY AS NEEDED FOR ANXIETY 03/29/24   Mozingo, Regina Nattalie, NP  morphine  (MSIR) 15 MG tablet Take 0.5 tablets (7.5 mg total) by mouth every 6 (six) hours as needed for severe pain (pain score 7-10). 10/28/23   Idol, Julie, PA-C  MOUNJARO 10 MG/0.5ML Pen SMARTSIG:10 Milligram(s) SUB-Q Once a Week    [provider]  naloxone  (NARCAN ) nasal spray 4 mg/0.1 mL Place into the nose. 09/12/21   [provider]  ondansetron  (ZOFRAN -ODT) 4 MG disintegrating tablet DISSOLVE ONE TABLET UNDER THE TONGUE EVERY 8 HOURS AS NEEDED FOR NAUSEA 12/28/22   Ines Onetha NOVAK, MD  oxyCODONE  (OXYCONTIN ) 10 mg 12 hr tablet Take 10 mg by mouth every 12 (twelve) hours.    [provider]  promethazine  (PHENERGAN ) 25 MG suppository Place 1 suppository (25 mg total) rectally every 6 (six) hours as needed for nausea or vomiting. 10/27/23   Garrick Charleston, MD  promethazine  (PHENERGAN ) 25 MG tablet Take 1 tablet (25 mg total) by mouth every 6 (six) hours as needed for nausea  or vomiting. 01/10/23 04/24/24  Waddell Sluder, PA-C  QUEtiapine  (SEROQUEL ) 100 MG tablet Take 1 tablet (100 mg total) by mouth at bedtime. 08/08/23   Mozingo, Regina Nattalie, NP  QUEtiapine  (SEROQUEL ) 200 MG tablet TAKE 1 TABLET BY MOUTH NIGHTLY AT BEDTIME Patient not taking: Reported on 04/24/2024 04/05/24   Mozingo, Regina Nattalie, NP  Rectal Protectant-Emollient (CALMOL-4) 76-10 % SUPP Use as needed 12/01/23   Arletta, Sara E, PA-C  Rimegepant Sulfate (NURTEC) 75 MG TBDP Take 1 tablet (75 mg total) by mouth daily as needed. For migraines. Take as close  to onset of migraine as possible. One daily maximum. 11/01/23   Lomax, Amy, NP  rizatriptan  (MAXALT -MLT) 10 MG disintegrating tablet Take 1 tablet (10 mg total) by mouth as needed for migraine. May repeat in 2 hours if needed 05/12/21   Ines Onetha NOVAK, MD  tiZANidine  (ZANAFLEX ) 4 MG tablet Take 1.5 tablets (6 mg total) by mouth every 8 (eight) hours. 04/13/20   [provider]  traMADol (ULTRAM-ER) 300 MG 24 hr tablet Take 300 mg by mouth daily.    [provider]  traZODone  (DESYREL ) 150 MG tablet TAKE 1 TABLET BY MOUTH AT BEDTIME 03/05/24   Mozingo, Regina Nattalie, NP    Allergies: Cinnamon, Clindamycin, Coconut (cocos nucifera), Coconut oil, Lovenox  [enoxaparin ], Nystatin, Aspartame, Niacin, Nitrofurantoin, Other, and Pregabalin    Review of Systems  Constitutional:  Negative for appetite change and fatigue.  HENT:  Negative for congestion, ear discharge and sinus pressure.   Eyes:  Negative for discharge.  Respiratory:  Negative for cough.   Cardiovascular:  Negative for chest pain.  Gastrointestinal:  Positive for vomiting. Negative for abdominal pain and diarrhea.  Genitourinary:  Negative for frequency and hematuria.  Musculoskeletal:  Negative for back pain.  Skin:  Negative for rash.  Neurological:  Negative for seizures and headaches.  Psychiatric/Behavioral:  Negative for hallucinations.     Updated Vital Signs BP (!) 135/100   Pulse 95   Temp 97.6 F (36.4 C) (Oral)   Resp 17   Ht 5' 1 (1.549 m)   Wt 122.5 kg   LMP 04/22/2024 (Approximate)   SpO2 97%   BMI 51.02 kg/m   Physical Exam Vitals and nursing note reviewed.  Constitutional:      Appearance: She is well-developed.  HENT:     Head: Normocephalic.     Nose: Nose normal.  Eyes:     General: No scleral icterus.    Conjunctiva/sclera: Conjunctivae normal.  Neck:     Thyroid : No thyromegaly.  Cardiovascular:     Rate and Rhythm: Normal rate and regular rhythm.     Heart sounds: No  murmur heard.    No friction rub. No gallop.  Pulmonary:     Breath sounds: No stridor. No wheezing or rales.  Chest:     Chest wall: No tenderness.  Abdominal:     General: There is no distension.     Tenderness: There is no abdominal tenderness. There is no rebound.  Musculoskeletal:        General: Normal range of motion.     Cervical back: Neck supple.  Lymphadenopathy:     Cervical: No cervical adenopathy.  Skin:    Findings: No erythema or rash.  Neurological:     Mental Status: She is alert and oriented to person, place, and time.     Motor: No abnormal muscle tone.     Coordination: Coordination normal.  Psychiatric:  Behavior: Behavior normal.     (all labs ordered are listed, but only abnormal results are displayed) Labs Reviewed  COMPREHENSIVE METABOLIC PANEL WITH GFR - Abnormal; Notable for the following components:      Result Value   Potassium 3.2 (*)    CO2 21 (*)    Glucose, Bld 174 (*)    Anion gap 18 (*)    All other components within normal limits  CBC - Abnormal; Notable for the following components:   Platelets 533 (*)    All other components within normal limits  URINALYSIS, ROUTINE W REFLEX MICROSCOPIC - Abnormal; Notable for the following components:   APPearance HAZY (*)    Ketones, ur 20 (*)    Protein, ur 100 (*)    All other components within normal limits  LIPASE, BLOOD    EKG: None  Radiology: No results found.   Procedures   Medications Ordered in the ED  sodium chloride  0.9 % bolus 1,000 mL (0 mLs Intravenous Stopped 05/22/24 2301)  droperidol  (INAPSINE ) 2.5 MG/ML injection 2.5 mg (2.5 mg Intravenous Given 05/22/24 2043)  LORazepam  (ATIVAN ) injection 0.5 mg (0.5 mg Intravenous Given 05/22/24 2043)  HYDROmorphone  (DILAUDID ) injection 1 mg (1 mg Intravenous Given 05/22/24 2044)  HYDROmorphone  (DILAUDID ) injection 1 mg (1 mg Intravenous Given 05/22/24 2234)                                    Medical Decision Making Amount  and/or Complexity of Data Reviewed Labs: ordered.  Risk Prescription drug management.   Cyclic vomiting that has improved with treatment in the emergency department.  Labs unremarkable.  Patient will continue taking her medicines that she has been prescribed at home and follow-up with her PCP     Final diagnoses:  Hematemesis with nausea  Chronic abdominal pain    ED Discharge Orders     None          Suzette Pac, MD 05/24/24 1225

## 2024-05-30 ENCOUNTER — Other Ambulatory Visit: Payer: Self-pay | Admitting: Adult Health

## 2024-05-30 DIAGNOSIS — G4700 Insomnia, unspecified: Secondary | ICD-10-CM

## 2024-05-30 NOTE — Telephone Encounter (Signed)
 Lvm for pt to call back to schedule

## 2024-05-30 NOTE — Telephone Encounter (Signed)
 She had an appt on 12/5

## 2024-05-30 NOTE — Telephone Encounter (Signed)
 Morning,  Please call pt to schedule appt. Past due for one.

## 2024-05-31 ENCOUNTER — Other Ambulatory Visit: Payer: Self-pay | Admitting: Adult Health

## 2024-05-31 ENCOUNTER — Telehealth: Payer: Self-pay | Admitting: Adult Health

## 2024-05-31 DIAGNOSIS — F902 Attention-deficit hyperactivity disorder, combined type: Secondary | ICD-10-CM

## 2024-05-31 DIAGNOSIS — F410 Panic disorder [episodic paroxysmal anxiety] without agoraphobia: Secondary | ICD-10-CM

## 2024-05-31 DIAGNOSIS — G4700 Insomnia, unspecified: Secondary | ICD-10-CM

## 2024-05-31 MED ORDER — TRAZODONE HCL 150 MG PO TABS: 150.0000 mg | ORAL_TABLET | Freq: Every day | ORAL | 0 refills | Status: DC

## 2024-05-31 NOTE — Telephone Encounter (Signed)
 Patient called in for refill on Trazedone 150mg . Ph: [phone] Appt  12/31 Pharmacy [facility] [address] [place], [place]

## 2024-05-31 NOTE — Telephone Encounter (Signed)
Sent 30 day supply

## 2024-06-12 ENCOUNTER — Encounter: Payer: Self-pay | Admitting: *Deleted

## 2024-06-12 ENCOUNTER — Other Ambulatory Visit: Payer: Self-pay | Admitting: *Deleted

## 2024-06-12 ENCOUNTER — Telehealth: Payer: Self-pay | Admitting: *Deleted

## 2024-06-12 ENCOUNTER — Ambulatory Visit: Admitting: Gastroenterology

## 2024-06-12 ENCOUNTER — Encounter: Payer: Self-pay | Admitting: Gastroenterology

## 2024-06-12 VITALS — BP 136/88 | HR 104 | Temp 98.6°F | Ht 61.0 in | Wt 287.2 lb

## 2024-06-12 DIAGNOSIS — K219 Gastro-esophageal reflux disease without esophagitis: Secondary | ICD-10-CM

## 2024-06-12 DIAGNOSIS — R112 Nausea with vomiting, unspecified: Secondary | ICD-10-CM | POA: Diagnosis not present

## 2024-06-12 MED ORDER — PROMETHAZINE HCL 25 MG PO TABS: 25.0000 mg | ORAL_TABLET | Freq: Four times a day (QID) | ORAL | 0 refills | Status: DC | PRN

## 2024-06-12 MED ORDER — PANTOPRAZOLE SODIUM 40 MG PO TBEC: 40.0000 mg | DELAYED_RELEASE_TABLET | Freq: Two times a day (BID) | ORAL | 3 refills | Status: AC

## 2024-06-12 MED ORDER — ONDANSETRON HCL 4 MG PO TABS: 4.0000 mg | ORAL_TABLET | Freq: Three times a day (TID) | ORAL | 1 refills | Status: AC | PRN

## 2024-06-12 NOTE — Progress Notes (Addendum)
 "   Gastroenterology Office Note    Referring Provider: Dwane Alfonso RIGGERS Primary Care Physician:  Dwane Alfonso, PA-C  Primary GI: Dr Cindie   Chief Complaint   Chief Complaint  Patient presents with   New Patient (Initial Visit)    New pt for nausea, vomiting, heartburn and gastroparesis.     History of Present Illness   Sonya Gibson is a 38 y.o. female presenting today at the request of Hutchinson, Alfonso RIGGERS with past history of DM, GERD, migraines, HTN, hypothyroidism, chronic opioid use, anxiety/depression, PTSD, previously seen by [facility] and last seen in June 2025. She is here to transfer care. She was also seen by GI many years ago in her 43s due to similar symptoms.   She notes cycles of vomiting. Intermittent. Now Worsening. At least weekly will vomit. Chronic GERD. No PPI for a long time. Big cycles will happen once every other month but vomiting every week multiple times a week. Some days no vomiting. Was on pantoprazole  many years ago.    ED in May 22, 2024.   Doesn't smoke marijuana.   Wants a port to help as she has poor venous access and notes over the past years has had multiple presentations to ED. Would like to be able to have fluids at home if needed.   6 spine surgeries. Spinal cord stimulator. Chronic pain meds. Has suppositories for rectal and even this won't help when cycles start.   Prolonged hospitalization after spinal cord stimulator placed and dura had been injured. Did muscle/fat transplant from thigh.   Mounjaro: will feel more sick the first few days with taking.  Endocrinologist: Warren Batty, [facility]. Sees upcoming.   Will throw up steak seeral days later.   Sensitive to red meats. Doesn't eat pork. Will feel like red meat gets stuck in esophagus.   Exhaustion, stress, anxiety will trigger symptoms.   No hematemesis, no overt GI bleed.   Opioid constipation. Unable to afford Linzess .   Kids have special needs:  Tourettes, autistic (son Daughter (autistic, physical delays) age 75   Sister: gastroparesis  No FH colon cancer  Father: colon polyps, unsure if adenomas   2019 last EGD   Negative celiac disease  In her 75s saw GI.   Past Medical History:  Diagnosis Date   Anxiety    Chronic back pain    Depression    Diabetes (HCC)    Drug-seeking behavior    Essential hypertension    GERD (gastroesophageal reflux disease)    Hypothyroidism    Kidney stone    Lumbar radiculopathy, right    Migraine headache    PTSD (post-traumatic stress disorder)     Past Surgical History:  Procedure Laterality Date   ANTERIOR CRUCIATE LIGAMENT REPAIR Left    BACK SURGERY  12/05/2017   CESAREAN SECTION     IR EPIDUROGRAPHY  11/17/2017   KNEE SURGERY     LUMBAR LAMINECTOMY/DECOMPRESSION MICRODISCECTOMY Right 12/05/2017   Procedure: Right L4-5 Microdiskectomy;  Surgeon: Joshua Alm RAMAN, MD;  Location: [facility] OR;  Service: Neurosurgery;  Laterality: Right;   LUMBAR LAMINECTOMY/DECOMPRESSION MICRODISCECTOMY N/A 12/20/2017   Procedure: Repair of CSF Leak;  Surgeon: Joshua Alm RAMAN, MD;  Location: [facility] OR;  Service: Neurosurgery;  Laterality: N/A;   LUMBAR WOUND DEBRIDEMENT N/A 12/27/2017   Procedure: Re-exploration for CSF leak repair and placement of lumbar drain;  Surgeon: Joshua Alm RAMAN, MD;  Location: [facility] OR;  Service: Neurosurgery;  Laterality: N/A;   middle  finger reatachment     PLACEMENT OF LUMBAR DRAIN N/A 12/27/2017   Procedure: PLACEMENT OF LUMBAR DRAIN;  Surgeon: Joshua Alm RAMAN, MD;  Location: [facility] OR;  Service: Neurosurgery;  Laterality: N/A;   SPINAL CORD STIMULATOR IMPLANT  03/31/2023   TONSILLECTOMY AND ADENOIDECTOMY  1996   VENTRICULOPERITONEAL SHUNT N/A 01/08/2018   Procedure: Re-exploration and repair of previous Lumbar cerebro-spinal fluid leak and placement of Lumbar drain;  Surgeon: Joshua Alm RAMAN, MD;  Location: [facility] OR;  Service: Neurosurgery;  Laterality: N/A;   VP shunt removal       Current Outpatient Medications  Medication Sig Dispense Refill   acetaminophen  (TYLENOL ) 325 MG tablet Take 650 mg by mouth every 6 (six) hours as needed.     albuterol  (VENTOLIN  HFA) 108 (90 Base) MCG/ACT inhaler Inhale 2 puffs into the lungs every 6 (six) hours as needed for wheezing or shortness of breath.     amphetamine -dextroamphetamine  (ADDERALL) 20 MG tablet TAKE 1 TABLET BY MOUTH TWICE DAILY 32 tablet 0   ARIPiprazole  (ABILIFY ) 2 MG tablet TAKE 1 TABLET BY MOUTH TWICE DAILY 60 tablet 0   botulinum toxin Type A  (BOTOX ) 200 units injection Provider to inject 155 units into the muscles of the head and neck every 12 weeks. Discard remainder. 1 each 2   calcium  carbonate (TUMS EX) 750 MG chewable tablet Chew 2 tablets by mouth as needed for heartburn.     celecoxib  (CELEBREX ) 200 MG capsule Take 200 mg by mouth daily.     CHERRY PO Take 1 tablet by mouth daily.     DULoxetine  (CYMBALTA ) 60 MG capsule TAKE TWO CAPSULES BY MOUTH DAILY 60 capsule 0   fluconazole  (DIFLUCAN ) 150 MG tablet Take 150 mg by mouth once.     gabapentin  (NEURONTIN ) 600 MG tablet Take 1,200 mg by mouth 3 (three) times daily.     LORazepam  (ATIVAN ) 0.5 MG tablet TAKE 1 TABLET BY MOUTH TWICE DAILY AS NEEDED FOR ANXIETY 32 tablet 0   morphine  (MSIR) 15 MG tablet Take 0.5 tablets (7.5 mg total) by mouth every 6 (six) hours as needed for severe pain (pain score 7-10). 10 tablet 0   MOUNJARO 10 MG/0.5ML Pen SMARTSIG:10 Milligram(s) SUB-Q Once a Week     naloxone  (NARCAN ) nasal spray 4 mg/0.1 mL Place into the nose.     ondansetron  (ZOFRAN -ODT) 4 MG disintegrating tablet DISSOLVE ONE TABLET UNDER THE TONGUE EVERY 8 HOURS AS NEEDED FOR NAUSEA 30 tablet 0   oxyCODONE  (OXYCONTIN ) 10 mg 12 hr tablet Take 10 mg by mouth every 12 (twelve) hours.     pantoprazole  (PROTONIX ) 40 MG tablet Take 1 tablet (40 mg total) by mouth 2 (two) times daily before a meal. 180 tablet 3   promethazine  (PHENERGAN ) 25 MG suppository Place 1  suppository (25 mg total) rectally every 6 (six) hours as needed for nausea or vomiting. 12 each 0   promethazine  (PHENERGAN ) 25 MG tablet Take 1 tablet (25 mg total) by mouth every 6 (six) hours as needed for nausea or vomiting. 120 tablet 0   QUEtiapine  (SEROQUEL ) 100 MG tablet Take 1 tablet (100 mg total) by mouth at bedtime. 30 tablet 5   QUEtiapine  (SEROQUEL ) 200 MG tablet TAKE 1 TABLET BY MOUTH NIGHTLY AT BEDTIME 30 tablet 0   Rectal Protectant-Emollient (CALMOL-4) 76-10 % SUPP Use as needed     Rimegepant Sulfate (NURTEC) 75 MG TBDP Take 1 tablet (75 mg total) by mouth daily as needed. For migraines. Take as close  to onset of migraine as possible. One daily maximum. 10 tablet 6   rizatriptan  (MAXALT -MLT) 10 MG disintegrating tablet Take 1 tablet (10 mg total) by mouth as needed for migraine. May repeat in 2 hours if needed 9 tablet 11   tiZANidine  (ZANAFLEX ) 4 MG tablet Take 1.5 tablets (6 mg total) by mouth every 8 (eight) hours.     traMADol (ULTRAM-ER) 300 MG 24 hr tablet Take 300 mg by mouth daily.     traZODone  (DESYREL ) 150 MG tablet Take 1 tablet (150 mg total) by mouth at bedtime. 30 tablet 0   No current facility-administered medications for this visit.    Allergies as of 06/12/2024 - Review Complete 06/12/2024  Allergen Reaction Noted   Cinnamon Anaphylaxis 12/20/2017   Clindamycin Nausea And Vomiting 10/11/2019   Coconut (cocos nucifera) Anaphylaxis 08/01/2016   Coconut oil Anaphylaxis 08/01/2016   Lovenox  [enoxaparin ] Other (See Comments) 12/20/2017   Nystatin Anaphylaxis 01/23/2019   Aspartame  06/09/2020   Niacin Hives 02/12/2021   Nitrofurantoin Hives 04/26/2010   Other  03/26/2018   Pregabalin  06/06/2020    Family History  Problem Relation Age of Onset   Diabetes Mellitus II Mother    Hypertension Mother    Seizures Mother    Other Mother        post stroke headaches   Stroke Mother        x 1   Pancreatic cancer Mother        diagnosed in her 27s    Hypertension Father    Diabetes Mellitus II Father    Hyperlipidemia Father    Heart disease Father    Hearing loss Father        was a VET   Bipolar disorder Sister    Autism Daughter        level 3   Other Daughter        tibia torsion & femoral aniversion   Asthma Son    Autism Son    Tourette syndrome Son     Social History   Socioeconomic History   Marital status: Married    Spouse name: Brad   Number of children: 3   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    Highest education level: Not on file  Occupational History   Not on file  Tobacco Use   Smoking status: Former    Current packs/day: 0.00    Average packs/day: 2.0 packs/day for 3.0 years (6.0 ttl pk-yrs)    Types: Cigarettes    Start date: 2003    Quit date: 2006    Years since quitting: 19.9   Smokeless tobacco: Never  Vaping Use   Vaping status: Never Used  Substance and Sexual Activity   Alcohol use: Yes    Comment: socially   Drug use: No   Sexual activity: Yes    Birth control/protection: None  Other Topics Concern   Not on file  Social History Narrative   Lives at home with husband and kids   Right handed   Caffeine : 6 cups daily   Social Drivers of Health   Tobacco Use: Medium Risk (05/23/2024)   Patient History    Smoking Tobacco Use: Former    Smokeless Tobacco Use: Never    Passive Exposure: Not on file  Financial Resource Strain: Low Risk (01/03/2022)   Received from [facility]   Overall Financial Resource Strain (CARDIA)    Difficulty of Paying Living Expenses: Not very hard  Food  Insecurity: Low Risk (11/29/2023)   Received from [facility]   Epic    Within the past 12 months, you worried that your food would run out before you got money to buy more: Never true    Within the past 12 months, the food you bought just didn't last and you didn't have money to get more. : Never true  Transportation Needs: No Transportation Needs (11/29/2023)   Received from [employer]    In the past 12 months, has lack of reliable transportation kept you from medical appointments, meetings, work or from getting things needed for daily living? : No  Physical Activity: Not on file  Stress: Not on file  Social Connections: Not on file  Intimate Partner Violence: Not on file  Depression (EYV7-0): Not on file  Alcohol Screen: Not on file  Housing: Low Risk (11/29/2023)   Received from [facility]   Epic    What is your living situation today?: I have a steady place to live    Think about the place you live. Do you have problems with any of the following? Choose all that apply:: None/None on this list  Utilities: Low Risk (11/29/2023)   Received from [facility]   Utilities    In the past 12 months has the electric, gas, oil, or water  company threatened to shut off services in your home? : No  Health Literacy: Not on file     Review of Systems   Gen: Denies any fever, chills, fatigue, weight loss, lack of appetite.  CV: Denies chest pain, heart palpitations, peripheral edema, syncope.  Resp: Denies shortness of breath at rest or with exertion. Denies wheezing or cough.  GI: Denies dysphagia or odynophagia. Denies jaundice, hematemesis, fecal incontinence. GU : Denies urinary burning, urinary frequency, urinary hesitancy MS: Denies joint pain, muscle weakness, cramps, or limitation of movement.  Derm: Denies rash, itching, dry skin Psych: Denies depression, anxiety, memory loss, and confusion Heme: Denies bruising, bleeding, and enlarged lymph nodes.   Physical Exam   BP (!) 147/101   Pulse (!) 104   Temp 98.6 F (37 C)   Ht 5' 1 (1.549 m)   Wt 287 lb 3.2 oz (130.3 kg)   LMP 06/05/2024 (Approximate)   BMI 54.27 kg/m  General:   Alert and oriented. Pleasant and cooperative. Well-nourished and well-developed.  Head:  Normocephalic and atraumatic. Eyes:  Without icterus Ears:  Normal auditory acuity. Lungs:  Clear to auscultation  bilaterally.  Heart:  S1, S2 present without murmurs appreciated.  Abdomen:  +BS, soft, non-tender and non-distended. No HSM noted. No guarding or rebound. No masses appreciated.  Rectal:  Deferred  Msk:  Symmetrical without gross deformities. Normal posture. Extremities:  Without edema. Neurologic:  Alert and  oriented x4;  grossly normal neurologically. Skin:  Intact without significant lesions or rashes. Psych:  Alert and cooperative. Normal mood and affect.   Assessment   Chronic N/V, suspect may have underlying gastroparesis, affecting quality of life  Chronic GERD but no PPI currently  Difficult IV access and requesting port placement    PLAN   Start pantoprazole  BID  Zofran  scheduled, phenergan  for rescue   Recheck CMP, alpha gal panel  GES  EGD in near future with Dr Cindie  Referral to surgery per patient request to discuss possible port access. I would rather us  maximize medical therapy first, but she would like to discuss this in case needed in future due to multiple  hospital visits, poor IV access  6 weeks follow-up  Therisa MICAEL Stager, PhD, ANP-BC [facility]    "

## 2024-06-12 NOTE — Patient Instructions (Addendum)
 I would like for you to start taking pantoprazole  twice a day, 30 minutes before breakfast and dinner (it works best on an empty stomach for absorption).  I have refilled Zofran . Take this up to 3 times a day, scheduled if needed. I sent phenergan  in for a rescue medication but limit this to only if needed and you feel severe symptoms coming. Do not drive while taking this. Side effects are drowsiness, fatigue, dry mouth.   Please have blood work done to recheck your potassium.I also ordered an alpha gal panel.  We are arranging an upper endoscopy with Dr. Cindie. You will need to hold Mounjaro one week prior.  Gastric emptying study also to be arranged and will hold Mounjaro one week prior and any opioids 24 hours prior.   I am referring you to Dr Kallie to discus risks/benefits of possible alternative access like a port for fluids. We will see if this is appropriate at this time or if we need to start with these measures first (meds, test, etc).   I will see you in 6 weeks! Have a wonderful Christmas, and thank you for the sweet gift!  I also included a handout about delayed gastric emptying. Even if you don't have this, it's good to follow the tips on here.    It was a pleasure to see you today. I want to create trusting relationships with patients and provide genuine, compassionate, and quality care. I truly value your feedback, so please be on the lookout for a survey regarding your visit with me today. I appreciate your time in completing this!         Therisa MICAEL Stager, PhD, ANP-BC [facility] Gastroenterology

## 2024-06-12 NOTE — Telephone Encounter (Signed)
 [place] PA for EGD:  Order ID: [id number]       Completed  Approval Valid Through: 06/12/2024 - 08/10/2024

## 2024-06-12 NOTE — H&P (View-Only) (Signed)
 "   Gastroenterology Office Note    Referring Provider: Dwane Alfonso RIGGERS Primary Care Physician:  Dwane Alfonso, PA-C  Primary GI: Dr Cindie   Chief Complaint   Chief Complaint  Patient presents with   New Patient (Initial Visit)    New pt for nausea, vomiting, heartburn and gastroparesis.     History of Present Illness   Sonya Gibson is a 38 y.o. female presenting today at the request of [place], Alfonso, [place] with past history of DM, GERD, migraines, HTN, hypothyroidism, chronic opioid use, anxiety/depression, PTSD,    Negative celiac disease  In her 72s saw GI.   Saw [facility] June  Cycles of vomiting. Intermittent. Worsening. At least weekly will vomit. Chronic GERD. No PPI for a long time. Big cycles will happen once every other month but vomiting every week multiple times a week. Some days no vomiting. Was on pantoprazole  many years ago.   Saw ED in May 22, 2024.   Doesn't smoke marijuana.   Wants a port to help  6 spine surgeries. Spinal cord stimulator. Chronic pain meds. Has suppositories for rectal and even this won't help when cycles start.   Prolonged hospitalization after spinal cord stimulator placed and dura had been injured. Did muscle/fat transplant from thigh.   Mounjaro: will feel more sick the first few days with taking.  Endocrinologist: Warren Batty, [facility]. Sees upcoming.   Will throw up steak seeral days later.   Sensitive to red meats. Doesn't eat pork. Will feel like red meat gets stuck in esophagus.   Exhaustion, stress, anxiety will trigger symptoms.   No hematemesis, no overt GI bleed.   Opioid constipation. Unable to afford Linzess .   Kids have special needs: Tourettes, autistic (son Daughter (autistic, physical delays) age 34   Sister: gastroparesis  No FH colon cancer  Father: colon polyps, unsure if adenomas   2019 last EGD, thought may have to stretch   Past Medical History:  Diagnosis Date   Anxiety     Chronic back pain    Depression    Diabetes (HCC)    Drug-seeking behavior    Essential hypertension    GERD (gastroesophageal reflux disease)    Hypothyroidism    Kidney stone    Lumbar radiculopathy, right    Migraine headache    PTSD (post-traumatic stress disorder)     Past Surgical History:  Procedure Laterality Date   ANTERIOR CRUCIATE LIGAMENT REPAIR Left    BACK SURGERY  12/05/2017   CESAREAN SECTION     IR EPIDUROGRAPHY  11/17/2017   KNEE SURGERY     LUMBAR LAMINECTOMY/DECOMPRESSION MICRODISCECTOMY Right 12/05/2017   Procedure: Right L4-5 Microdiskectomy;  Surgeon: Joshua Alm RAMAN, MD;  Location: [facility] OR;  Service: Neurosurgery;  Laterality: Right;   LUMBAR LAMINECTOMY/DECOMPRESSION MICRODISCECTOMY N/A 12/20/2017   Procedure: Repair of CSF Leak;  Surgeon: Joshua Alm RAMAN, MD;  Location: [facility] OR;  Service: Neurosurgery;  Laterality: N/A;   LUMBAR WOUND DEBRIDEMENT N/A 12/27/2017   Procedure: Re-exploration for CSF leak repair and placement of lumbar drain;  Surgeon: Joshua Alm RAMAN, MD;  Location: [facility] OR;  Service: Neurosurgery;  Laterality: N/A;   middle finger reatachment     PLACEMENT OF LUMBAR DRAIN N/A 12/27/2017   Procedure: PLACEMENT OF LUMBAR DRAIN;  Surgeon: Joshua Alm RAMAN, MD;  Location: [facility] OR;  Service: Neurosurgery;  Laterality: N/A;   SPINAL CORD STIMULATOR IMPLANT  03/31/2023   TONSILLECTOMY AND ADENOIDECTOMY  1996   VENTRICULOPERITONEAL SHUNT N/A 01/08/2018  Procedure: Re-exploration and repair of previous Lumbar cerebro-spinal fluid leak and placement of Lumbar drain;  Surgeon: Joshua Alm RAMAN, MD;  Location: [facility] OR;  Service: Neurosurgery;  Laterality: N/A;   VP shunt removal      Current Outpatient Medications  Medication Sig Dispense Refill   acetaminophen  (TYLENOL ) 325 MG tablet Take 650 mg by mouth every 6 (six) hours as needed.     albuterol  (VENTOLIN  HFA) 108 (90 Base) MCG/ACT inhaler Inhale 2 puffs into the lungs every 6 (six) hours as needed for  wheezing or shortness of breath.     amphetamine -dextroamphetamine  (ADDERALL) 20 MG tablet TAKE 1 TABLET BY MOUTH TWICE DAILY 32 tablet 0   ARIPiprazole  (ABILIFY ) 2 MG tablet TAKE 1 TABLET BY MOUTH TWICE DAILY 60 tablet 0   botulinum toxin Type A  (BOTOX ) 200 units injection Provider to inject 155 units into the muscles of the head and neck every 12 weeks. Discard remainder. 1 each 2   calcium  carbonate (TUMS EX) 750 MG chewable tablet Chew 2 tablets by mouth as needed for heartburn.     celecoxib  (CELEBREX ) 200 MG capsule Take 200 mg by mouth daily.     CHERRY PO Take 1 tablet by mouth daily.     DULoxetine  (CYMBALTA ) 60 MG capsule TAKE TWO CAPSULES BY MOUTH DAILY 60 capsule 0   fluconazole  (DIFLUCAN ) 150 MG tablet Take 150 mg by mouth once.     gabapentin  (NEURONTIN ) 600 MG tablet Take 1,200 mg by mouth 3 (three) times daily.     LORazepam  (ATIVAN ) 0.5 MG tablet TAKE 1 TABLET BY MOUTH TWICE DAILY AS NEEDED FOR ANXIETY 32 tablet 0   morphine  (MSIR) 15 MG tablet Take 0.5 tablets (7.5 mg total) by mouth every 6 (six) hours as needed for severe pain (pain score 7-10). 10 tablet 0   MOUNJARO 10 MG/0.5ML Pen SMARTSIG:10 Milligram(s) SUB-Q Once a Week     naloxone  (NARCAN ) nasal spray 4 mg/0.1 mL Place into the nose.     ondansetron  (ZOFRAN -ODT) 4 MG disintegrating tablet DISSOLVE ONE TABLET UNDER THE TONGUE EVERY 8 HOURS AS NEEDED FOR NAUSEA 30 tablet 0   oxyCODONE  (OXYCONTIN ) 10 mg 12 hr tablet Take 10 mg by mouth every 12 (twelve) hours.     pantoprazole  (PROTONIX ) 40 MG tablet Take 1 tablet (40 mg total) by mouth 2 (two) times daily before a meal. 180 tablet 3   promethazine  (PHENERGAN ) 25 MG suppository Place 1 suppository (25 mg total) rectally every 6 (six) hours as needed for nausea or vomiting. 12 each 0   promethazine  (PHENERGAN ) 25 MG tablet Take 1 tablet (25 mg total) by mouth every 6 (six) hours as needed for nausea or vomiting. 120 tablet 0   QUEtiapine  (SEROQUEL ) 100 MG tablet Take 1  tablet (100 mg total) by mouth at bedtime. 30 tablet 5   QUEtiapine  (SEROQUEL ) 200 MG tablet TAKE 1 TABLET BY MOUTH NIGHTLY AT BEDTIME 30 tablet 0   Rectal Protectant-Emollient (CALMOL-4) 76-10 % SUPP Use as needed     Rimegepant Sulfate (NURTEC) 75 MG TBDP Take 1 tablet (75 mg total) by mouth daily as needed. For migraines. Take as close to onset of migraine as possible. One daily maximum. 10 tablet 6   rizatriptan  (MAXALT -MLT) 10 MG disintegrating tablet Take 1 tablet (10 mg total) by mouth as needed for migraine. May repeat in 2 hours if needed 9 tablet 11   tiZANidine  (ZANAFLEX ) 4 MG tablet Take 1.5 tablets (6 mg total) by mouth every  8 (eight) hours.     traMADol (ULTRAM-ER) 300 MG 24 hr tablet Take 300 mg by mouth daily.     traZODone  (DESYREL ) 150 MG tablet Take 1 tablet (150 mg total) by mouth at bedtime. 30 tablet 0   No current facility-administered medications for this visit.    Allergies as of 06/12/2024 - Review Complete 06/12/2024  Allergen Reaction Noted   Cinnamon Anaphylaxis 12/20/2017   Clindamycin Nausea And Vomiting 10/11/2019   Coconut (cocos nucifera) Anaphylaxis 08/01/2016   Coconut oil Anaphylaxis 08/01/2016   Lovenox  [enoxaparin ] Other (See Comments) 12/20/2017   Nystatin Anaphylaxis 01/23/2019   Aspartame  06/09/2020   Niacin Hives 02/12/2021   Nitrofurantoin Hives 04/26/2010   Other  03/26/2018   Pregabalin  06/06/2020    Family History  Problem Relation Age of Onset   Diabetes Mellitus II Mother    Hypertension Mother    Seizures Mother    Other Mother        post stroke headaches   Stroke Mother        x 48   Pancreatic cancer Mother        diagnosed in her 19s   Hypertension Father    Diabetes Mellitus II Father    Hyperlipidemia Father    Heart disease Father    Hearing loss Father        was a [employer]   Bipolar disorder Sister    Autism Daughter        level 3   Other Daughter        tibia torsion & femoral aniversion   Asthma Son     Autism Son    Tourette syndrome Son     Social History   Socioeconomic History   Marital status: Married    Spouse name: Brad   Number of children: 3   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    Highest education level: Not on file  Occupational History   Not on file  Tobacco Use   Smoking status: Former    Current packs/day: 0.00    Average packs/day: 2.0 packs/day for 3.0 years (6.0 ttl pk-yrs)    Types: Cigarettes    Start date: 2003    Quit date: 2006    Years since quitting: 19.9   Smokeless tobacco: Never  Vaping Use   Vaping status: Never Used  Substance and Sexual Activity   Alcohol use: Yes    Comment: socially   Drug use: No   Sexual activity: Yes    Birth control/protection: None  Other Topics Concern   Not on file  Social History Narrative   Lives at home with husband and kids   Right handed   Caffeine : 6 cups daily   Social Drivers of Health   Tobacco Use: Medium Risk (05/23/2024)   Patient History    Smoking Tobacco Use: Former    Smokeless Tobacco Use: Never    Passive Exposure: Not on Actuary Strain: Low Risk (01/03/2022)   Received from [facility]   Overall Financial Resource Strain (CARDIA)    Difficulty of Paying Living Expenses: Not very hard  Food Insecurity: Low Risk (11/29/2023)   Received from [facility]   Epic    Within the past 12 months, you worried that your food would run out before you got money to buy more: Never true    Within the past 12 months, the food you bought just didn't last and you didn't have money  to get more. : Never true  Transportation Needs: No Transportation Needs (11/29/2023)   Received from [facility]    In the past 12 months, has lack of reliable transportation kept you from medical appointments, meetings, work or from getting things needed for daily living? : No  Physical Activity: Not on file  Stress: Not on file  Social Connections: Not on file  Intimate Partner  Violence: Not on file  Depression (EYV7-0): Not on file  Alcohol Screen: Not on file  Housing: Low Risk (11/29/2023)   Received from [facility]   Epic    What is your living situation today?: I have a steady place to live    Think about the place you live. Do you have problems with any of the following? Choose all that apply:: None/None on this list  Utilities: Low Risk (11/29/2023)   Received from [facility]   Utilities    In the past 12 months has the electric, gas, oil, or water  company threatened to shut off services in your home? : No  Health Literacy: Not on file     Review of Systems   Gen: Denies any fever, chills, fatigue, weight loss, lack of appetite.  CV: Denies chest pain, heart palpitations, peripheral edema, syncope.  Resp: Denies shortness of breath at rest or with exertion. Denies wheezing or cough.  GI: Denies dysphagia or odynophagia. Denies jaundice, hematemesis, fecal incontinence. GU : Denies urinary burning, urinary frequency, urinary hesitancy MS: Denies joint pain, muscle weakness, cramps, or limitation of movement.  Derm: Denies rash, itching, dry skin Psych: Denies depression, anxiety, memory loss, and confusion Heme: Denies bruising, bleeding, and enlarged lymph nodes.   Physical Exam   BP (!) 147/101   Pulse (!) 104   Temp 98.6 F (37 C)   Ht 5' 1 (1.549 m)   Wt 287 lb 3.2 oz (130.3 kg)   LMP 06/05/2024 (Approximate)   BMI 54.27 kg/m  General:   Alert and oriented. Pleasant and cooperative. Well-nourished and well-developed.  Head:  Normocephalic and atraumatic. Eyes:  Without icterus Ears:  Normal auditory acuity. Lungs:  Clear to auscultation bilaterally.  Heart:  S1, S2 present without murmurs appreciated.  Abdomen:  +BS, soft, non-tender and non-distended. No HSM noted. No guarding or rebound. No masses appreciated.  Rectal:  Deferred  Msk:  Symmetrical without gross deformities. Normal posture. Extremities:  Without  edema. Neurologic:  Alert and  oriented x4;  grossly normal neurologically. Skin:  Intact without significant lesions or rashes. Psych:  Alert and cooperative. Normal mood and affect.   Assessment   Chronic N/V, suspect may have underlying gastroparesis, affecting quality of life  Chronic GERD but no PPI currently  Difficult IV access and requesting port placement    PLAN   Start pantoprazole  BID  Zofran  scheduled, phenergan  for rescue   Recheck CMP, alpha gal panel  GES  EGD in near future with Dr Cindie  Referral to surgery per patient request to discuss possible port access. I would rather us  maximize medical therapy first, but she would like to discuss this in case needed in future due to multiple hospital visits, poor IV access  6 weeks follow-up  Therisa MICAEL Stager, PhD, ANP-BC [facility]    "

## 2024-06-16 LAB — COMPREHENSIVE METABOLIC PANEL WITH GFR
ALT: 21 IU/L (ref 0–32)
AST: 22 IU/L (ref 0–40)
Albumin: 4.5 g/dL (ref 3.9–4.9)
Alkaline Phosphatase: 94 IU/L (ref 41–116)
BUN/Creatinine Ratio: 9 (ref 9–23)
BUN: 7 mg/dL (ref 6–20)
Bilirubin Total: 0.4 mg/dL (ref 0.0–1.2)
CO2: 26 mmol/L (ref 20–29)
Calcium: 10.9 mg/dL — ABNORMAL HIGH (ref 8.7–10.2)
Chloride: 95 mmol/L — ABNORMAL LOW (ref 96–106)
Creatinine, Ser: 0.82 mg/dL (ref 0.57–1.00)
Globulin, Total: 2.7 g/dL (ref 1.5–4.5)
Glucose: 148 mg/dL — ABNORMAL HIGH (ref 70–99)
Potassium: 5 mmol/L (ref 3.5–5.2)
Sodium: 137 mmol/L (ref 134–144)
Total Protein: 7.2 g/dL (ref 6.0–8.5)
eGFR: 94 mL/min/1.73

## 2024-06-16 LAB — ALPHA-GAL PANEL
Allergen Lamb IgE: <0.1 kU/L
Beef IgE: <0.1 kU/L
IgE (Immunoglobulin E), Serum: 3 [IU]/mL — ABNORMAL LOW (ref 6–495)
O215-IgE Alpha-Gal: <0.1 kU/L
Pork IgE: <0.1 kU/L

## 2024-06-17 ENCOUNTER — Ambulatory Visit: Payer: Self-pay | Admitting: Gastroenterology

## 2024-06-17 DIAGNOSIS — R112 Nausea with vomiting, unspecified: Secondary | ICD-10-CM

## 2024-06-18 ENCOUNTER — Other Ambulatory Visit: Payer: Self-pay | Admitting: Adult Health

## 2024-06-18 DIAGNOSIS — F422 Mixed obsessional thoughts and acts: Secondary | ICD-10-CM

## 2024-06-18 DIAGNOSIS — F902 Attention-deficit hyperactivity disorder, combined type: Secondary | ICD-10-CM

## 2024-06-18 DIAGNOSIS — F4310 Post-traumatic stress disorder, unspecified: Secondary | ICD-10-CM

## 2024-06-18 DIAGNOSIS — F411 Generalized anxiety disorder: Secondary | ICD-10-CM

## 2024-06-18 DIAGNOSIS — F410 Panic disorder [episodic paroxysmal anxiety] without agoraphobia: Secondary | ICD-10-CM

## 2024-06-19 ENCOUNTER — Telehealth: Payer: Self-pay | Admitting: Adult Health

## 2024-06-19 DIAGNOSIS — Z0389 Encounter for observation for other suspected diseases and conditions ruled out: Secondary | ICD-10-CM

## 2024-06-19 NOTE — Progress Notes (Signed)
 Patient no show appointment. ? ?

## 2024-06-19 NOTE — Telephone Encounter (Signed)
 Has appt today

## 2024-06-19 NOTE — Telephone Encounter (Signed)
 Pt has had 3 NS in a row.

## 2024-06-24 NOTE — Telephone Encounter (Signed)
 Sent warning letter to patient

## 2024-06-25 ENCOUNTER — Encounter (HOSPITAL_COMMUNITY)
Admission: RE | Admit: 2024-06-25 | Discharge: 2024-06-25 | Disposition: A | Discharge status: Home / Self Care | Source: Ambulatory Visit | Attending: Gastroenterology | Admitting: Gastroenterology

## 2024-06-25 ENCOUNTER — Encounter (HOSPITAL_COMMUNITY): Payer: Self-pay

## 2024-06-25 ENCOUNTER — Other Ambulatory Visit (HOSPITAL_COMMUNITY)
Admission: RE | Admit: 2024-06-25 | Discharge: 2024-06-25 | Disposition: A | Source: Ambulatory Visit | Attending: Gastroenterology | Admitting: Gastroenterology

## 2024-06-25 DIAGNOSIS — R112 Nausea with vomiting, unspecified: Secondary | ICD-10-CM | POA: Diagnosis present

## 2024-06-25 MED ORDER — TECHNETIUM TC 99M SULFUR COLLOID
2.0000 | Freq: Once | INTRAVENOUS | Status: AC | PRN
  Administered 2024-06-25: 1.9 via INTRAVENOUS

## 2024-06-26 LAB — IGG, IGA, IGM
IgA: 294 mg/dL (ref 87–352)
IgG (Immunoglobin G), Serum: 928 mg/dL (ref 586–1602)
IgM (Immunoglobulin M), Srm: 227 mg/dL — ABNORMAL HIGH (ref 26–217)

## 2024-06-28 ENCOUNTER — Other Ambulatory Visit: Payer: Self-pay | Admitting: Adult Health

## 2024-06-28 DIAGNOSIS — F410 Panic disorder [episodic paroxysmal anxiety] without agoraphobia: Secondary | ICD-10-CM

## 2024-06-28 DIAGNOSIS — G4700 Insomnia, unspecified: Secondary | ICD-10-CM

## 2024-06-28 DIAGNOSIS — F411 Generalized anxiety disorder: Secondary | ICD-10-CM

## 2024-06-30 NOTE — Telephone Encounter (Signed)
Sent MyChart message to schedule FU.

## 2024-07-03 ENCOUNTER — Other Ambulatory Visit: Payer: Self-pay

## 2024-07-04 ENCOUNTER — Other Ambulatory Visit: Payer: Self-pay

## 2024-07-04 NOTE — Telephone Encounter (Signed)
 Hey Tammy,  Quick question does this pt still need a pregnancy test? Please advise

## 2024-07-04 NOTE — Telephone Encounter (Signed)
 Spoke with Madelin Ponto the scheduler she will contact the pt regarding this

## 2024-07-08 ENCOUNTER — Ambulatory Visit: Admitting: Family Medicine

## 2024-07-08 ENCOUNTER — Telehealth: Payer: Self-pay | Admitting: Adult Health

## 2024-07-08 NOTE — Telephone Encounter (Signed)
 Pt needs rf of all meds   Appt 1/30   [facility]

## 2024-07-09 ENCOUNTER — Other Ambulatory Visit (HOSPITAL_COMMUNITY)
Admission: RE | Admit: 2024-07-09 | Discharge: 2024-07-09 | Disposition: A | Discharge status: Home / Self Care | Source: Ambulatory Visit | Attending: Internal Medicine | Admitting: Internal Medicine

## 2024-07-09 DIAGNOSIS — E039 Hypothyroidism, unspecified: Secondary | ICD-10-CM | POA: Diagnosis not present

## 2024-07-09 DIAGNOSIS — R12 Heartburn: Secondary | ICD-10-CM | POA: Diagnosis present

## 2024-07-09 DIAGNOSIS — M1990 Unspecified osteoarthritis, unspecified site: Secondary | ICD-10-CM | POA: Diagnosis not present

## 2024-07-09 DIAGNOSIS — R112 Nausea with vomiting, unspecified: Secondary | ICD-10-CM | POA: Diagnosis not present

## 2024-07-09 DIAGNOSIS — E119 Type 2 diabetes mellitus without complications: Secondary | ICD-10-CM | POA: Diagnosis not present

## 2024-07-09 DIAGNOSIS — F419 Anxiety disorder, unspecified: Secondary | ICD-10-CM | POA: Diagnosis not present

## 2024-07-09 DIAGNOSIS — G8929 Other chronic pain: Secondary | ICD-10-CM | POA: Diagnosis not present

## 2024-07-09 DIAGNOSIS — Z791 Long term (current) use of non-steroidal anti-inflammatories (NSAID): Secondary | ICD-10-CM | POA: Diagnosis not present

## 2024-07-09 DIAGNOSIS — K3184 Gastroparesis: Secondary | ICD-10-CM | POA: Diagnosis not present

## 2024-07-09 DIAGNOSIS — Z79891 Long term (current) use of opiate analgesic: Secondary | ICD-10-CM | POA: Diagnosis not present

## 2024-07-09 DIAGNOSIS — Z87891 Personal history of nicotine dependence: Secondary | ICD-10-CM | POA: Diagnosis not present

## 2024-07-09 DIAGNOSIS — Z7951 Long term (current) use of inhaled steroids: Secondary | ICD-10-CM | POA: Diagnosis not present

## 2024-07-09 DIAGNOSIS — K219 Gastro-esophageal reflux disease without esophagitis: Secondary | ICD-10-CM | POA: Insufficient documentation

## 2024-07-09 DIAGNOSIS — Z79899 Other long term (current) drug therapy: Secondary | ICD-10-CM | POA: Diagnosis not present

## 2024-07-09 DIAGNOSIS — Z6841 Body Mass Index (BMI) 40.0 and over, adult: Secondary | ICD-10-CM | POA: Diagnosis not present

## 2024-07-09 DIAGNOSIS — Z7985 Long-term (current) use of injectable non-insulin antidiabetic drugs: Secondary | ICD-10-CM | POA: Diagnosis not present

## 2024-07-09 DIAGNOSIS — Z5309 Procedure and treatment not carried out because of other contraindication: Secondary | ICD-10-CM | POA: Diagnosis not present

## 2024-07-09 DIAGNOSIS — I10 Essential (primary) hypertension: Secondary | ICD-10-CM | POA: Diagnosis not present

## 2024-07-09 DIAGNOSIS — E6689 Other obesity not elsewhere classified: Secondary | ICD-10-CM | POA: Diagnosis not present

## 2024-07-09 DIAGNOSIS — F32A Depression, unspecified: Secondary | ICD-10-CM | POA: Diagnosis not present

## 2024-07-09 DIAGNOSIS — G43909 Migraine, unspecified, not intractable, without status migrainosus: Secondary | ICD-10-CM | POA: Diagnosis not present

## 2024-07-09 DIAGNOSIS — F4310 Post-traumatic stress disorder, unspecified: Secondary | ICD-10-CM | POA: Diagnosis not present

## 2024-07-09 LAB — PREGNANCY, URINE: Preg Test, Ur: NEGATIVE

## 2024-07-10 ENCOUNTER — Encounter (HOSPITAL_COMMUNITY)
Admission: RE | Disposition: A | Discharge status: Home / Self Care | Payer: Self-pay | Source: Home / Self Care | Attending: Internal Medicine

## 2024-07-10 ENCOUNTER — Ambulatory Visit (HOSPITAL_COMMUNITY)

## 2024-07-10 ENCOUNTER — Ambulatory Visit (HOSPITAL_COMMUNITY)
Admission: RE | Admit: 2024-07-10 | Discharge: 2024-07-10 | Disposition: A | Discharge status: Home / Self Care | Attending: Internal Medicine | Admitting: Internal Medicine

## 2024-07-10 ENCOUNTER — Encounter (HOSPITAL_COMMUNITY): Payer: Self-pay | Admitting: Internal Medicine

## 2024-07-10 ENCOUNTER — Other Ambulatory Visit: Payer: Self-pay

## 2024-07-10 ENCOUNTER — Other Ambulatory Visit: Payer: Self-pay | Admitting: Gastroenterology

## 2024-07-10 ENCOUNTER — Other Ambulatory Visit: Payer: Self-pay | Admitting: Adult Health

## 2024-07-10 DIAGNOSIS — K219 Gastro-esophageal reflux disease without esophagitis: Secondary | ICD-10-CM | POA: Insufficient documentation

## 2024-07-10 DIAGNOSIS — F419 Anxiety disorder, unspecified: Secondary | ICD-10-CM | POA: Insufficient documentation

## 2024-07-10 DIAGNOSIS — R12 Heartburn: Secondary | ICD-10-CM | POA: Diagnosis not present

## 2024-07-10 DIAGNOSIS — Z7951 Long term (current) use of inhaled steroids: Secondary | ICD-10-CM | POA: Insufficient documentation

## 2024-07-10 DIAGNOSIS — E119 Type 2 diabetes mellitus without complications: Secondary | ICD-10-CM | POA: Insufficient documentation

## 2024-07-10 DIAGNOSIS — Z87891 Personal history of nicotine dependence: Secondary | ICD-10-CM | POA: Insufficient documentation

## 2024-07-10 DIAGNOSIS — F4310 Post-traumatic stress disorder, unspecified: Secondary | ICD-10-CM | POA: Insufficient documentation

## 2024-07-10 DIAGNOSIS — Z5309 Procedure and treatment not carried out because of other contraindication: Secondary | ICD-10-CM | POA: Insufficient documentation

## 2024-07-10 DIAGNOSIS — E039 Hypothyroidism, unspecified: Secondary | ICD-10-CM | POA: Insufficient documentation

## 2024-07-10 DIAGNOSIS — K3184 Gastroparesis: Secondary | ICD-10-CM | POA: Insufficient documentation

## 2024-07-10 DIAGNOSIS — G4700 Insomnia, unspecified: Secondary | ICD-10-CM

## 2024-07-10 DIAGNOSIS — T182XXA Foreign body in stomach, initial encounter: Secondary | ICD-10-CM

## 2024-07-10 DIAGNOSIS — E6689 Other obesity not elsewhere classified: Secondary | ICD-10-CM | POA: Insufficient documentation

## 2024-07-10 DIAGNOSIS — Z79891 Long term (current) use of opiate analgesic: Secondary | ICD-10-CM | POA: Insufficient documentation

## 2024-07-10 DIAGNOSIS — M1990 Unspecified osteoarthritis, unspecified site: Secondary | ICD-10-CM | POA: Insufficient documentation

## 2024-07-10 DIAGNOSIS — G8929 Other chronic pain: Secondary | ICD-10-CM | POA: Insufficient documentation

## 2024-07-10 DIAGNOSIS — Z6841 Body Mass Index (BMI) 40.0 and over, adult: Secondary | ICD-10-CM | POA: Insufficient documentation

## 2024-07-10 DIAGNOSIS — Z79899 Other long term (current) drug therapy: Secondary | ICD-10-CM | POA: Insufficient documentation

## 2024-07-10 DIAGNOSIS — Z791 Long term (current) use of non-steroidal anti-inflammatories (NSAID): Secondary | ICD-10-CM | POA: Insufficient documentation

## 2024-07-10 DIAGNOSIS — Z7985 Long-term (current) use of injectable non-insulin antidiabetic drugs: Secondary | ICD-10-CM | POA: Insufficient documentation

## 2024-07-10 DIAGNOSIS — R112 Nausea with vomiting, unspecified: Secondary | ICD-10-CM | POA: Insufficient documentation

## 2024-07-10 DIAGNOSIS — F32A Depression, unspecified: Secondary | ICD-10-CM | POA: Insufficient documentation

## 2024-07-10 DIAGNOSIS — G43909 Migraine, unspecified, not intractable, without status migrainosus: Secondary | ICD-10-CM | POA: Insufficient documentation

## 2024-07-10 DIAGNOSIS — I10 Essential (primary) hypertension: Secondary | ICD-10-CM | POA: Insufficient documentation

## 2024-07-10 HISTORY — PX: ESOPHAGOGASTRODUODENOSCOPY: SHX5428

## 2024-07-10 LAB — GLUCOSE, CAPILLARY: Glucose-Capillary: 143 mg/dL — ABNORMAL HIGH (ref 70–99)

## 2024-07-10 MED ORDER — PROPOFOL 10 MG/ML IV BOLUS
INTRAVENOUS | Status: DC | PRN
  Administered 2024-07-10 (×2): 50 mg via INTRAVENOUS

## 2024-07-10 MED ORDER — METOCLOPRAMIDE HCL 5 MG/ML IJ SOLN
10.0000 mg | Freq: Once | INTRAMUSCULAR | Status: AC
  Administered 2024-07-10: 10 mg via INTRAVENOUS

## 2024-07-10 MED ORDER — PROPOFOL 500 MG/50ML IV EMUL
INTRAVENOUS | Status: DC | PRN
  Administered 2024-07-10: 150 ug/kg/min via INTRAVENOUS

## 2024-07-10 MED ORDER — QUETIAPINE FUMARATE 200 MG PO TABS: 200.0000 mg | ORAL_TABLET | Freq: Every day | ORAL | 0 refills | Status: DC

## 2024-07-10 MED ORDER — LIDOCAINE 2% (20 MG/ML) 5 ML SYRINGE
INTRAMUSCULAR | Status: DC | PRN
  Administered 2024-07-10: 100 mg via INTRAVENOUS

## 2024-07-10 MED ORDER — DEXMEDETOMIDINE HCL IN NACL 80 MCG/20ML IV SOLN
INTRAVENOUS | Status: DC | PRN
  Administered 2024-07-10 (×2): 10 ug via INTRAVENOUS

## 2024-07-10 MED ORDER — LACTATED RINGERS IV SOLN
INTRAVENOUS | Status: DC
  Administered 2024-07-10: 500 mL via INTRAVENOUS

## 2024-07-10 MED ORDER — METOCLOPRAMIDE HCL 5 MG/ML IJ SOLN
INTRAMUSCULAR | Status: AC
  Filled 2024-07-10: qty 2

## 2024-07-10 NOTE — Discharge Instructions (Addendum)
 EGD Discharge instructions Please read the instructions outlined below and refer to this sheet in the next few weeks. These discharge instructions provide you with general information on caring for yourself after you leave the hospital. Your doctor may also give you specific instructions. While your treatment has been planned according to the most current medical practices available, unavoidable complications occasionally occur. If you have any problems or questions after discharge, please call your doctor. ACTIVITY You may resume your regular activity but move at a slower pace for the next 24 hours.  Take frequent rest periods for the next 24 hours.  Walking will help expel (get rid of) the air and reduce the bloated feeling in your abdomen.  No driving for 24 hours (because of the anesthesia (medicine) used during the test).  You may shower.  Do not sign any important legal documents or operate any machinery for 24 hours (because of the anesthesia used during the test).  NUTRITION Drink plenty of fluids.  You may resume your normal diet.  Begin with a light meal and progress to your normal diet.  Avoid alcoholic beverages for 24 hours or as instructed by your caregiver.  MEDICATIONS You may resume your normal medications unless your caregiver tells you otherwise.  WHAT YOU CAN EXPECT TODAY You may experience abdominal discomfort such as a feeling of fullness or gas pains.  FOLLOW-UP Your doctor will discuss the results of your test with you.  SEEK IMMEDIATE MEDICAL ATTENTION IF ANY OF THE FOLLOWING OCCUR: Excessive nausea (feeling sick to your stomach) and/or vomiting.  Severe abdominal pain and distention (swelling).  Trouble swallowing.  Temperature over 101 F (37.8 C).  Rectal bleeding or vomiting of blood.    Your upper endoscopy revealed a large amount of food in your stomach.  Procedure was aborted due to risk of aspiration.  Technically incomplete procedure today.  This does  raise the question of gastroparesis (delayed gastric emptying) as the main cause of your recurrent nausea and vomiting.  Follow-up in office in 4 to 6 weeks to discuss further.  We can consider repeat EGD at a later date.  I hope you have a great rest of your week!  Carlin POUR. Cindie, D.O. Gastroenterology and Hepatology [facility]

## 2024-07-10 NOTE — Anesthesia Preprocedure Evaluation (Signed)
"                                    Anesthesia Evaluation  Patient identified by MRN, date of birth, ID band Patient awake    Reviewed: Allergy & Precautions, H&P , NPO status , Patient's Chart, lab work & pertinent test results  Airway Mallampati: II  TM Distance: >3 FB Neck ROM: Full    Dental  (+) Lower Dentures, Upper Dentures   Pulmonary pneumonia, resolved, former smoker   Pulmonary exam normal breath sounds clear to auscultation       Cardiovascular hypertension, Normal cardiovascular exam Rhythm:Regular Rate:Normal     Neuro/Psych  Headaches, Seizures -,  PSYCHIATRIC DISORDERS Anxiety Depression    Pt had CSF leak during back surgery 2019  Neuromuscular disease    GI/Hepatic Neg liver ROS,GERD  ,,  Endo/Other  diabetesHypothyroidism  Class 4 obesity  Renal/GU Renal disease  negative genitourinary   Musculoskeletal  (+) Arthritis ,    Abdominal   Peds negative pediatric ROS (+)  Hematology negative hematology ROS (+)   Anesthesia Other Findings   Reproductive/Obstetrics negative OB ROS                              Anesthesia Physical Anesthesia Plan  ASA: 3  Anesthesia Plan: General   Post-op Pain Management:    Induction: Intravenous  PONV Risk Score and Plan:   Airway Management Planned: Nasal Cannula and Natural Airway  Additional Equipment:   Intra-op Plan:   Post-operative Plan:   Informed Consent: I have reviewed the patients History and Physical, chart, labs and discussed the procedure including the risks, benefits and alternatives for the proposed anesthesia with the patient or authorized representative who has indicated his/her understanding and acceptance.     Dental advisory given  Plan Discussed with: CRNA  Anesthesia Plan Comments: (Pt has delayed gastric emptying and ate a full meal at 1930 Reglan  ordered preop )        Anesthesia Quick Evaluation  "

## 2024-07-10 NOTE — Progress Notes (Signed)
 Specialty Pharmacy Refill Coordination Note  Sonya Gibson is a 39 y.o. female assessed today regarding refills of clinic administered specialty medication(s) OnabotulinumtoxinA  (Botox )   Clinic requested Courier to Provider Office   Delivery date: 07/18/24   Verified address: [facility] [address]   Medication will be filled on: 07/17/24  Appointment 07/23/24.

## 2024-07-10 NOTE — Telephone Encounter (Signed)
 Sent Abilify , Seroquel , trazodone , and Cymbalta .

## 2024-07-10 NOTE — Op Note (Signed)
 [facility] Patient Name: Sonya Gibson Procedure Date: 07/10/2024 8:43 AM MRN: [id number] Date of Birth: Sep 19, 1985 Attending MD: Carlin POUR. Cindie , [place], [id number] CSN: [id number] Age: 39 Admit Type: Outpatient Procedure:                Upper GI endoscopy Indications:              Heartburn, Nausea with vomiting Providers:                Carlin POUR. Cindie, DO, Devere Lodge, Daphne Mulch                            Technician, Technician Referring MD:              Medicines:                See the Anesthesia note for documentation of the                            administered medications Complications:            No immediate complications. Estimated Blood Loss:     Estimated blood loss: none. Procedure:                Pre-Anesthesia Assessment:                           - The anesthesia plan was to use monitored                            anesthesia care (MAC).                           After obtaining informed consent, the endoscope was                            passed under direct vision. Throughout the                            procedure, the patient's blood pressure, pulse, and                            oxygen  saturations were monitored continuously. The                            [id number] ([id number]) Upper was introduced through                            the mouth, and advanced to the second part of                            duodenum. The upper GI endoscopy was accomplished                            without difficulty. The patient tolerated the                            procedure well. Scope In: 8:59:25  AM Scope Out: 9:00:23 AM Total Procedure Duration: 0 hours 0 minutes 58 seconds  Findings:      A large amount of food (residue) was found in the gastric body.       Procedure then aborted due to risk of aspiration. Impression:               - A large amount of food (residue) in the stomach.                           - No specimens collected. Moderate Sedation:       Per Anesthesia Care Recommendation:           - Patient has a contact number available for                            emergencies. The signs and symptoms of potential                            delayed complications were discussed with the                            patient. Return to normal activities tomorrow.                            Written discharge instructions were provided to the                            patient.                           - Resume previous diet.                           - Continue present medications.                           - Technically incomplete procedure today due to                            large amount of food in stomach. Follow-up in GI                            office in 4 to 6 weeks. Findings consistent with                            gastroparesis. Consider repeat EGD with 2 days of                            clear liquids prior. Procedure Code(s):        --- Professional ---                           [phone], Esophagogastroduodenoscopy, flexible,                            transoral; diagnostic, including collection of  specimen(s) by brushing or washing, when performed                            (separate procedure) Diagnosis Code(s):        --- Professional ---                           R12, Heartburn                           R11.2, Nausea with vomiting, unspecified CPT copyright 2022 American Medical Association. All rights reserved. The codes documented in this report are preliminary and upon coder review may  be revised to meet current compliance requirements. Carlin POUR. Cindie, DO Carlin POUR. Cindie, DO 07/10/2024 9:05:35 AM This report has been signed electronically. Number of Addenda: 0

## 2024-07-10 NOTE — Interval H&P Note (Signed)
 History and Physical Interval Note:  07/10/2024 8:18 AM  Sonya Gibson  has presented today for surgery, with the diagnosis of N/V, GERD.  The various methods of treatment have been discussed with the patient and family. After consideration of risks, benefits and other options for treatment, the patient has consented to  Procedures with comments: EGD (ESOPHAGOGASTRODUODENOSCOPY) (N/A) - 8:30 AM, ASA 2 as a surgical intervention.  The patient's history has been reviewed, patient examined, no change in status, stable for surgery.  I have reviewed the patient's chart and labs.  Questions were answered to the patient's satisfaction.     [name]

## 2024-07-10 NOTE — Anesthesia Postprocedure Evaluation (Signed)
"   Anesthesia Post Note  Patient: RYVER ZADROZNY  Procedure(s) Performed: EGD (ESOPHAGOGASTRODUODENOSCOPY)  Patient location during evaluation: PACU Anesthesia Type: General Level of consciousness: awake and alert Pain management: pain level controlled Vital Signs Assessment: post-procedure vital signs reviewed and stable Respiratory status: spontaneous breathing, nonlabored ventilation, respiratory function stable and patient connected to nasal cannula oxygen  Cardiovascular status: blood pressure returned to baseline and stable Postop Assessment: no apparent nausea or vomiting Anesthetic complications: no Comments: Patient had food in her stomach; procedure d/c'd    No notable events documented.   Last Vitals:  Vitals:   07/10/24 0737 07/10/24 0907  BP: 120/84 101/69  Pulse: (!) 111 100  Resp: 16 17  Temp: 36.8 C 36.8 C  SpO2: 95% 97%    Last Pain:  Vitals:   07/10/24 0907  TempSrc: Oral  PainSc: 0-No pain                 Andrea Limes      "

## 2024-07-10 NOTE — Transfer of Care (Signed)
 Immediate Anesthesia Transfer of Care Note  Patient: LARISA LANIUS  Procedure(s) Performed: EGD (ESOPHAGOGASTRODUODENOSCOPY)  Patient Location: Short Stay  Anesthesia Type:MAC  Level of Consciousness: awake, oriented, and patient cooperative  Airway & Oxygen  Therapy: Patient Spontanous Breathing  Post-op Assessment: Report given to RN and Post -op Vital signs reviewed and stable  Post vital signs: Reviewed and stable  Last Vitals:  Vitals Value Taken Time  BP 101/69 07/10/24 09:07  Temp 36.8 C 07/10/24 09:07  Pulse 100 07/10/24 09:07  Resp 17 07/10/24 09:07  SpO2 97 % on RA 07/10/24 09:07    Last Pain:  Vitals:   07/10/24 9092  TempSrc: Oral  PainSc: 0-No pain      Patients Stated Pain Goal: 9 (07/10/24 0737)  Complications: No notable events documented.

## 2024-07-11 ENCOUNTER — Encounter (HOSPITAL_COMMUNITY): Payer: Self-pay | Admitting: Internal Medicine

## 2024-07-12 ENCOUNTER — Other Ambulatory Visit: Payer: Self-pay

## 2024-07-12 DIAGNOSIS — F410 Panic disorder [episodic paroxysmal anxiety] without agoraphobia: Secondary | ICD-10-CM

## 2024-07-12 MED ORDER — LORAZEPAM 0.5 MG PO TABS: 0.5000 mg | ORAL_TABLET | Freq: Every day | ORAL | 0 refills | Status: DC | PRN

## 2024-07-12 NOTE — Telephone Encounter (Signed)
 Patient called in to office for refill on Adderall 20mg , Trazedone 150mg  and Lorazepam  0.5mg . PH: [phone] appt 1/30 Pharmacy [facility] [address] Dr Maryruth CHILD

## 2024-07-12 NOTE — Telephone Encounter (Signed)
 Trazodone  has already been sent. One lorazepam  every day given to cover until appt, no Adderall until appt per [place]man.

## 2024-07-15 ENCOUNTER — Ambulatory Visit: Payer: Self-pay | Admitting: Gastroenterology

## 2024-07-17 ENCOUNTER — Other Ambulatory Visit: Payer: Self-pay

## 2024-07-19 ENCOUNTER — Encounter: Payer: Self-pay | Admitting: Adult Health

## 2024-07-19 ENCOUNTER — Telehealth: Admitting: Adult Health

## 2024-07-19 DIAGNOSIS — F902 Attention-deficit hyperactivity disorder, combined type: Secondary | ICD-10-CM

## 2024-07-19 DIAGNOSIS — F422 Mixed obsessional thoughts and acts: Secondary | ICD-10-CM

## 2024-07-19 DIAGNOSIS — F410 Panic disorder [episodic paroxysmal anxiety] without agoraphobia: Secondary | ICD-10-CM

## 2024-07-19 DIAGNOSIS — G4700 Insomnia, unspecified: Secondary | ICD-10-CM

## 2024-07-19 DIAGNOSIS — F411 Generalized anxiety disorder: Secondary | ICD-10-CM

## 2024-07-19 DIAGNOSIS — F4310 Post-traumatic stress disorder, unspecified: Secondary | ICD-10-CM

## 2024-07-19 MED ORDER — AMPHETAMINE-DEXTROAMPHETAMINE 20 MG PO TABS: 20.0000 mg | ORAL_TABLET | Freq: Two times a day (BID) | ORAL | 0 refills | Status: AC

## 2024-07-19 MED ORDER — DULOXETINE HCL 60 MG PO CPEP: 120.0000 mg | ORAL_CAPSULE | Freq: Every day | ORAL | 2 refills | Status: AC

## 2024-07-19 MED ORDER — ARIPIPRAZOLE 2 MG PO TABS: 2.0000 mg | ORAL_TABLET | Freq: Two times a day (BID) | ORAL | 2 refills | Status: AC

## 2024-07-19 MED ORDER — QUETIAPINE FUMARATE 200 MG PO TABS: 200.0000 mg | ORAL_TABLET | Freq: Every day | ORAL | 2 refills | Status: AC

## 2024-07-19 MED ORDER — LORAZEPAM 0.5 MG PO TABS: ORAL_TABLET | ORAL | 2 refills | Status: AC

## 2024-07-19 MED ORDER — TRAZODONE HCL 150 MG PO TABS: 150.0000 mg | ORAL_TABLET | Freq: Every day | ORAL | 2 refills | Status: AC

## 2024-07-19 NOTE — Progress Notes (Signed)
 Sonya Gibson [id number] 1985-08-02 39 y.o.  Virtual Visit via Video Note  I connected with pt @ on 07/19/24 at  4:00 PM EST by a video enabled telemedicine application and verified that I am speaking with the correct person using two identifiers.   I discussed the limitations of evaluation and management by telemedicine and the availability of in person appointments. The patient expressed understanding and agreed to proceed.  I discussed the assessment and treatment plan with the patient. The patient was provided an opportunity to ask questions and all were answered. The patient agreed with the plan and demonstrated an understanding of the instructions.   The patient was advised to call back or seek an in-person evaluation if the symptoms worsen or if the condition fails to improve as anticipated.  I provided 25 minutes of non-face-to-face time during this encounter.  The patient was located at home.  The provider was located at [facility].   Angeline LOISE Sayers, NP   Subjective:   Patient ID:  Sonya Gibson is a 39 y.o. (DOB 1985-08-27) female.  Chief Complaint: No chief complaint on file.   HPI Sonya Gibson presents for follow-up of PTSD, panic attacks, obsessional thoughts and acts, depression, ADD - combined type and bipolar disorder - 2.  Describes mood today as so-so. Pleasant. Reports tearfulness. Mood symptoms - reports some depression, anxiety and irritability with everything that I'm dealing with. Reports varying interest and motivation. Reports panic attacks. Reports flash-backs and nightmares. Reports decreased outbursts. Reports worry, rumination and over thinking. Reports obsessive thoughts and acts. Reports increased situational stressors. Reports mood as lower. Stating I feel like I'm struggling, but it's manageable. Reports current medication regimen is helpful. Taking medications as prescribed. Appetite adequate. Weight stable - 263 pounds - [place] -  A1C improved. Reports sleeping well most nights. Averages 7 to 9 hours. Reports some daytime napping. Reports focus and concentration stable with Adderall. Diagnosed with ADD in childhood. Completing tasks. Managing aspects of household. Receiving LTD through her employer currently. Disability hearing un March for SSDI.   Denies SI or HI.  Denies AH. Denies VH. Denies self harm. Denies substance use.   Previous medication trials:  Buspar, Hydroxyzine.  Review of Systems:  Review of Systems  Musculoskeletal:  Negative for gait problem.  Neurological:  Negative for tremors.  Psychiatric/Behavioral:         Please refer to HPI    Medications: I have reviewed the patient's current medications.  Current Outpatient Medications  Medication Sig Dispense Refill   acetaminophen  (TYLENOL ) 325 MG tablet Take 650 mg by mouth every 6 (six) hours as needed.     albuterol  (VENTOLIN  HFA) 108 (90 Base) MCG/ACT inhaler Inhale 2 puffs into the lungs every 6 (six) hours as needed for wheezing or shortness of breath.     amphetamine -dextroamphetamine  (ADDERALL) 20 MG tablet TAKE 1 TABLET BY MOUTH TWICE DAILY 32 tablet 0   ARIPiprazole  (ABILIFY ) 2 MG tablet TAKE 1 TABLET BY MOUTH TWICE DAILY 60 tablet 0   botulinum toxin Type A  (BOTOX ) 200 units injection Provider to inject 155 units into the muscles of the head and neck every 12 weeks. Discard remainder. 1 each 2   calcium  carbonate (TUMS EX) 750 MG chewable tablet Chew 2 tablets by mouth as needed for heartburn.     celecoxib  (CELEBREX ) 200 MG capsule Take 200 mg by mouth daily.     CHERRY PO Take 1 tablet by mouth daily.  DULoxetine  (CYMBALTA ) 60 MG capsule TAKE TWO CAPSULES BY MOUTH DAILY 60 capsule 0   fluconazole  (DIFLUCAN ) 150 MG tablet Take 150 mg by mouth once.     gabapentin  (NEURONTIN ) 600 MG tablet Take 1,200 mg by mouth 3 (three) times daily.     ibuprofen  (ADVIL ) 800 MG tablet Take 800 mg by mouth every 8 (eight) hours as needed.      LORazepam  (ATIVAN ) 0.5 MG tablet Take 1 tablet (0.5 mg total) by mouth daily as needed. for anxiety 7 tablet 0   morphine  (MSIR) 15 MG tablet Take 0.5 tablets (7.5 mg total) by mouth every 6 (six) hours as needed for severe pain (pain score 7-10). 10 tablet 0   MOUNJARO 10 MG/0.5ML Pen SMARTSIG:10 Milligram(s) SUB-Q Once a Week     naloxone  (NARCAN ) nasal spray 4 mg/0.1 mL Place into the nose.     ondansetron  (ZOFRAN ) 4 MG tablet Take 1 tablet (4 mg total) by mouth every 8 (eight) hours as needed for nausea or vomiting. 120 tablet 1   ondansetron  (ZOFRAN -ODT) 4 MG disintegrating tablet DISSOLVE ONE TABLET UNDER THE TONGUE EVERY 8 HOURS AS NEEDED FOR NAUSEA 30 tablet 0   oxyCODONE  (OXYCONTIN ) 10 mg 12 hr tablet Take 10 mg by mouth every 12 (twelve) hours.     pantoprazole  (PROTONIX ) 40 MG tablet Take 1 tablet (40 mg total) by mouth 2 (two) times daily before a meal. 180 tablet 3   promethazine  (PHENERGAN ) 25 MG suppository Place 1 suppository (25 mg total) rectally every 6 (six) hours as needed for nausea or vomiting. 12 each 0   promethazine  (PHENERGAN ) 25 MG tablet Take 1 tablet (25 mg total) by mouth every 6 (six) hours as needed for nausea or vomiting. 120 tablet 0   promethazine  (PHENERGAN ) 25 MG tablet TAKE 1 TABLET BY MOUTH EVERY 6 HOURS AS NEEDED FOR NAUSEA OR VOMITING 30 tablet 0   QUEtiapine  (SEROQUEL ) 200 MG tablet Take 1 tablet (200 mg total) by mouth at bedtime. 30 tablet 0   Rectal Protectant-Emollient (CALMOL-4) 76-10 % SUPP Use as needed     Rimegepant Sulfate (NURTEC) 75 MG TBDP Take 1 tablet (75 mg total) by mouth daily as needed. For migraines. Take as close to onset of migraine as possible. One daily maximum. 10 tablet 6   rizatriptan  (MAXALT -MLT) 10 MG disintegrating tablet Take 1 tablet (10 mg total) by mouth as needed for migraine. May repeat in 2 hours if needed 9 tablet 11   tiZANidine  (ZANAFLEX ) 4 MG tablet Take 1.5 tablets (6 mg total) by mouth every 8 (eight) hours.      traMADol (ULTRAM-ER) 300 MG 24 hr tablet Take 300 mg by mouth daily.     traZODone  (DESYREL ) 150 MG tablet TAKE 1 TABLET BY MOUTH AT BEDTIME (NEED TO KEEP APPOINTMENT FOR FUTURE REFILLS) 30 tablet 0   No current facility-administered medications for this visit.    Medication Side Effects: None  Allergies: Allergies[1]  Past Medical History:  Diagnosis Date   Anxiety    Chronic back pain    Depression    Diabetes (HCC)    Drug-seeking behavior    Essential hypertension    GERD (gastroesophageal reflux disease)    Hypothyroidism    Kidney stone    Lumbar radiculopathy, right    Migraine headache    PTSD (post-traumatic stress disorder)     Family History  Problem Relation Age of Onset   Diabetes Mellitus II Mother    Hypertension Mother  Seizures Mother    Other Mother        post stroke headaches   Stroke Mother        x 4   Pancreatic cancer Mother        diagnosed in her 14s   Hypertension Father    Diabetes Mellitus II Father    Hyperlipidemia Father    Heart disease Father    Hearing loss Father        was a VET   Bipolar disorder Sister    Autism Daughter        level 3   Other Daughter        tibia torsion & femoral aniversion   Asthma Son    Autism Son    Tourette syndrome Son     Social History   Socioeconomic History   Marital status: Married    Spouse name: Brad   Number of children: 3   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    Highest education level: Not on file  Occupational History   Not on file  Tobacco Use   Smoking status: Former    Current packs/day: 0.00    Average packs/day: 2.0 packs/day for 3.0 years (6.0 ttl pk-yrs)    Types: Cigarettes    Start date: 2003    Quit date: 2006    Years since quitting: 20.0   Smokeless tobacco: Never  Vaping Use   Vaping status: Never Used  Substance and Sexual Activity   Alcohol use: Yes    Comment: socially   Drug use: No   Sexual activity: Yes    Birth control/protection: None  Other  Topics Concern   Not on file  Social History Narrative   Lives at home with husband and kids   Right handed   Caffeine : 6 cups daily   Social Drivers of Health   Tobacco Use: Medium Risk (07/10/2024)   Patient History    Smoking Tobacco Use: Former    Smokeless Tobacco Use: Never    Passive Exposure: Not on file  Financial Resource Strain: Low Risk (01/03/2022)   Received from [facility]   Overall Financial Resource Strain (CARDIA)    Difficulty of Paying Living Expenses: Not very hard  Food Insecurity: Low Risk (11/29/2023)   Received from [facility]   Epic    Within the past 12 months, you worried that your food would run out before you got money to buy more: Never true    Within the past 12 months, the food you bought just didn't last and you didn't have money to get more. : Never true  Transportation Needs: No Transportation Needs (11/29/2023)   Received from [facility]    In the past 12 months, has lack of reliable transportation kept you from medical appointments, meetings, work or from getting things needed for daily living? : No  Physical Activity: Not on file  Stress: Not on file  Social Connections: Not on file  Intimate Partner Violence: Not on file  Depression (EYV7-0): Not on file  Alcohol Screen: Not on file  Housing: Low Risk (11/29/2023)   Received from [facility]   Epic    What is your living situation today?: I have a steady place to live    Think about the place you live. Do you have problems with any of the following? Choose all that apply:: None/None on this list  Utilities: Low Risk (11/29/2023)   Received from [facility]   Utilities    In the past 12 months has the electric, gas, oil, or water  company threatened to shut off services in your home? : No  Health Literacy: Not on file    Past Medical History, Surgical history, Social history, and Family history were reviewed and updated as appropriate.   Please see review  of systems for further details on the patient's review from today.   Objective:   Physical Exam:  LMP 06/05/2024 (Within Days)   Physical Exam Constitutional:      General: She is not in acute distress. Musculoskeletal:        General: No deformity.  Neurological:     Mental Status: She is alert and oriented to person, place, and time.     Coordination: Coordination normal.  Psychiatric:        Attention and Perception: Attention and perception normal. She does not perceive auditory or visual hallucinations.        Mood and Affect: Mood normal. Mood is not anxious or depressed. Affect is not labile, blunt, angry or inappropriate.        Speech: Speech normal.        Behavior: Behavior normal.        Thought Content: Thought content normal. Thought content is not paranoid or delusional. Thought content does not include homicidal or suicidal ideation. Thought content does not include homicidal or suicidal plan.        Cognition and Memory: Cognition and memory normal.        Judgment: Judgment normal.     Comments: Insight intact     Lab Review:     Component Value Date/Time   NA 137 06/12/2024 0955   K 5.0 06/12/2024 0955   CL 95 (L) 06/12/2024 0955   CO2 26 06/12/2024 0955   GLUCOSE 148 (H) 06/12/2024 0955   GLUCOSE 174 (H) 05/22/2024 1737   BUN 7 06/12/2024 0955   CREATININE 0.82 06/12/2024 0955   CALCIUM  10.9 (H) 06/12/2024 0955   PROT 7.2 06/12/2024 0955   ALBUMIN 4.5 06/12/2024 0955   AST 22 06/12/2024 0955   ALT 21 06/12/2024 0955   ALKPHOS 94 06/12/2024 0955   BILITOT 0.4 06/12/2024 0955   GFRNONAA >60 05/22/2024 1737   GFRAA >60 02/21/2020 1033       Component Value Date/Time   WBC 9.2 05/22/2024 1737   RBC 4.46 05/22/2024 1737   HGB 13.6 05/22/2024 1737   HCT 40.4 05/22/2024 1737   PLT 533 (H) 05/22/2024 1737   MCV 90.6 05/22/2024 1737   MCH 30.5 05/22/2024 1737   MCHC 33.7 05/22/2024 1737   RDW 14.3 05/22/2024 1737   LYMPHSABS 1.5 01/03/2024 0803    MONOABS 0.4 01/03/2024 0803   EOSABS 0.0 01/03/2024 0803   BASOSABS 0.0 01/03/2024 0803    No results found for: POCLITH, LITHIUM   No results found for: PHENYTOIN, PHENOBARB, VALPROATE, CBMZ   .res Assessment: Plan:   Plan:  PDMP reviewed  Continue: Adderall 20mg  BID Abilify  2mg  twice daily for mood symptoms  Seroquel  100mg  at hs Trazadone 150mg  at bedtime - typically 75mg  at hs Cymbalta  60mg  - 2 daily Lorazepam  0.5mg  daily prn panic attacks   Recent labs WNL  Monitor BP between visits while taking stimulant medication.   RTC 8 weeks  25 minutes spent dedicated to the care of this patient on the date of this encounter to include pre-visit review of records, ordering of medication, post visit documentation, and face-to-face  time with the patient discussing PTSD, panic attacks, obsessional thoughts and acts, depression, ADD - combined type and bipolar disorder - 2. Discussed continuing current medication regimen.  Patient advised to contact office with any questions, adverse effects, or acute worsening in signs and symptoms.  Discussed potential benefits, risk, and side effects of benzodiazepines to include potential risk of tolerance and dependence, as well as possible drowsiness.  Advised patient not to drive if experiencing drowsiness and to take lowest possible effective dose to minimize risk of dependence and tolerance.   Discussed potential metabolic side effects associated with atypical antipsychotics, as well as potential risk for movement side effects. Advised pt to contact office if movement side effects occur.    There are no diagnoses linked to this encounter.   Please see After Visit Summary for patient specific instructions.  Future Appointments  Date Time Provider Department Center  07/19/2024  4:00 PM Loc Feinstein, Angeline Mattocks, NP CP[facility]CP None  07/23/2024 11:30 AM Cary No, NP [facility] None  07/30/2024  9:00 AM Kallie Manuelita BROCKS, MD [facility] None   08/15/2024 10:30 AM Shirlean Therisa ORN, NP [facility]    No orders of the defined types were placed in this encounter.     -------------------------------      [1]  Allergies Allergen Reactions   Cinnamon Anaphylaxis    (NO REAL/TREE BARK CINNAMON)   Clindamycin Nausea And Vomiting    Other reaction(s): gi distress Chest pain, trouble swallowing Chest pain, trouble swallowing    Coconut (Cocos Nucifera) Anaphylaxis    Can eat fake coconut   Coconut Oil Anaphylaxis   Lovenox  [Enoxaparin ] Other (See Comments)    BROKE DOWN THE SKIN AT INJECTION SITE AND CAUSED A WOUND   Nystatin Anaphylaxis   Aspartame     Other reaction(s): Other (See Comments) Breathing Difficulty, Serious intense migraines.  Breathing Difficulty, Serious intense migraines.     Niacin Hives   Nitrofurantoin Hives   Other     Headache cocktail   Blister to abd   Pregabalin     Other reaction(s): Other (See Comments) Depression with suicidal thoughts Depression with suicidal thoughts

## 2024-07-23 ENCOUNTER — Ambulatory Visit: Admitting: Family Medicine

## 2024-07-23 DIAGNOSIS — G43709 Chronic migraine without aura, not intractable, without status migrainosus: Secondary | ICD-10-CM

## 2024-07-30 ENCOUNTER — Ambulatory Visit: Admitting: General Surgery

## 2024-08-15 ENCOUNTER — Ambulatory Visit: Admitting: Gastroenterology

## 2024-08-22 ENCOUNTER — Ambulatory Visit: Admitting: Gastroenterology

## 2024-10-15 ENCOUNTER — Telehealth: Admitting: Adult Health

## 2024-10-22 ENCOUNTER — Ambulatory Visit: Admitting: Family Medicine
